# Patient Record
Sex: Male | Born: 1970
Health system: Southern US, Community
[De-identification: ages and names within clinical notes are randomized; demographics above are authoritative.]

## PROBLEM LIST (undated history)

## (undated) DIAGNOSIS — I639 Cerebral infarction, unspecified: Secondary | ICD-10-CM

## (undated) DIAGNOSIS — I10 Essential (primary) hypertension: Secondary | ICD-10-CM

## (undated) DIAGNOSIS — I509 Heart failure, unspecified: Secondary | ICD-10-CM

## (undated) DIAGNOSIS — T7840XA Allergy, unspecified, initial encounter: Secondary | ICD-10-CM

## (undated) DIAGNOSIS — E669 Obesity, unspecified: Secondary | ICD-10-CM

## (undated) DIAGNOSIS — N189 Chronic kidney disease, unspecified: Secondary | ICD-10-CM

## (undated) DIAGNOSIS — E119 Type 2 diabetes mellitus without complications: Secondary | ICD-10-CM

## (undated) DIAGNOSIS — N182 Chronic kidney disease, stage 2 (mild): Secondary | ICD-10-CM

## (undated) HISTORY — DX: Allergy, unspecified, initial encounter: T78.40XA

## (undated) HISTORY — DX: Type 2 diabetes mellitus without complications: E11.9

## (undated) HISTORY — DX: Chronic kidney disease, unspecified: N18.9

## (undated) HISTORY — DX: Heart failure, unspecified: I50.9

## (undated) HISTORY — DX: Cerebral infarction, unspecified: I63.9

## (undated) HISTORY — DX: Chronic kidney disease, stage 2 (mild): N18.2

---

## 2012-11-29 ENCOUNTER — Ambulatory Visit (INDEPENDENT_AMBULATORY_CARE_PROVIDER_SITE_OTHER): Payer: BC Managed Care – PPO | Admitting: Family Medicine

## 2012-11-29 VITALS — BP 134/84 | HR 96 | Temp 98.7°F | Resp 16 | Ht 72.0 in | Wt 329.0 lb

## 2012-11-29 DIAGNOSIS — R059 Cough, unspecified: Secondary | ICD-10-CM

## 2012-11-29 DIAGNOSIS — R05 Cough: Secondary | ICD-10-CM

## 2012-11-29 DIAGNOSIS — J069 Acute upper respiratory infection, unspecified: Secondary | ICD-10-CM

## 2012-11-29 MED ORDER — HYDROCODONE-HOMATROPINE 5-1.5 MG/5ML PO SYRP: ORAL_SOLUTION | ORAL | Status: DC

## 2012-11-29 MED ORDER — AZITHROMYCIN 250 MG PO TABS: ORAL_TABLET | ORAL | Status: DC

## 2012-11-29 NOTE — Progress Notes (Signed)
Subjective:    Patient ID: Richard [name], male    DOB: 10/22/1970, 42 y.o.   MRN: [id number]  HPI Richard [name] is a 42 y.o. male  Cough congestion - started 6 days ago - thought improving past few days, then worse this am - more mucus/cough. Clear-yellow mucus. No mucus. fever 3 days ago - not past 2 days.   Fatigue.  Cough has kept up at night at times.  Asthma as child - no recent flairs and no wheezing with colds/cough  Tx: mucinex otc, otc zyrtec. Mod relief.   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     Past Medical History  Diagnosis Date  . Allergy    History reviewed. No pertinent past surgical history. No Known Allergies Prior to Admission medications   Not on File   History   Social History  . Marital Status: Single    Spouse Name: N/A    Number of Children: N/A  . Years of Education: N/A   Occupational History  . Not on file.   Social History Main Topics  . Smoking status: Never Smoker   . Smokeless tobacco: Not on file  . Alcohol Use: No  . Drug Use: No  . Sexually Active: Not on file   Other Topics Concern  . Not on file   Social History Narrative  . No narrative on file       Review of Systems  Constitutional: Negative for fever and chills.  HENT: Positive for congestion. Negative for sinus pressure.   Respiratory: Positive for cough and shortness of breath (few days ago only - not today. ). Negative for chest tightness and wheezing.   Cardiovascular: Negative for chest pain and palpitations.       Objective:   Physical Exam  Vitals reviewed. Constitutional: He is oriented to person, place, and time. He appears well-developed and well-nourished.  HENT:  Head: Normocephalic and atraumatic.  Right Ear: Tympanic membrane, external ear and ear canal normal.  Left Ear: Tympanic membrane, external ear and ear canal normal.  Nose: No rhinorrhea. Right sinus exhibits no maxillary sinus tenderness and no frontal sinus  tenderness. Left sinus exhibits no maxillary sinus tenderness and no frontal sinus tenderness.  Mouth/Throat: Oropharynx is clear and moist and mucous membranes are normal. No oropharyngeal exudate or posterior oropharyngeal erythema.  Eyes: Conjunctivae are normal. Pupils are equal, round, and reactive to light.  Neck: Neck supple.  Cardiovascular: Normal rate, regular rhythm, normal heart sounds and intact distal pulses.   No murmur heard. Pulmonary/Chest: Effort normal and breath sounds normal. He has no wheezes. He has no rhonchi. He has no rales.  Clear, normal effort, no distress.   Abdominal: Soft. There is no tenderness.  Lymphadenopathy:    He has no cervical adenopathy.  Neurological: He is alert and oriented to person, place, and time.  Skin: Skin is warm and dry. No rash noted.  Psychiatric: He has a normal mood and affect. His behavior is normal.       Assessment & Plan:  Richard [name] is a 42 y.o. male Cough - Plan: HYDROcodone-homatropine (HYCODAN) 5-1.5 MG/5ML syrup, azithromycin (ZITHROMAX) 250 MG tablet  Acute upper respiratory infections of unspecified site - Plan: azithromycin (ZITHROMAX) 250 MG tablet  6 day hx of cough, URI likely vs early bronchitis with slight subjective worsening this am. Cont sx care with mucinex, hycodan at night if needed, zpak Rx - can wait to fill for 1-2 days  if not starting to improve. rtc precautions given.   Meds ordered this encounter  Medications  . HYDROcodone-homatropine (HYCODAN) 5-1.5 MG/5ML syrup    Sig: 53m by mouth at bedtime as needed for cough.    Dispense:  120 mL    Refill:  0  . azithromycin (ZITHROMAX) 250 MG tablet    Sig: Take 2 pills by mouth on day 1, then 1 pill by mouth per day on days 2 through 5.    Dispense:  6 each    Refill:  0   Patient Instructions   over the counter mucinex or mucinex DM, drink plenty of fluids. Cough syrup at night if needed. If not improving in the next day or two - can fill  antibiotic. Return to the clinic or go to the nearest emergency room if any of your symptoms worsen or new symptoms occur. Cough, Adult  A cough is a reflex that helps clear your throat and airways. It can help heal the body or may be a reaction to an irritated airway. A cough may only last 2 or 3 weeks (acute) or may last more than 8 weeks (chronic).  CAUSES Acute cough:  Viral or bacterial infections. Chronic cough:  Infections.  Allergies.  Asthma.  Post-nasal drip.  Smoking.  Heartburn or acid reflux.  Some medicines.  Chronic lung problems (COPD).  Cancer. SYMPTOMS   Cough.  Fever.  Chest pain.  Increased breathing rate.  High-pitched whistling sound when breathing (wheezing).  Colored mucus that you cough up (sputum). TREATMENT   A bacterial cough may be treated with antibiotic medicine.  A viral cough must run its course and will not respond to antibiotics.  Your caregiver may recommend other treatments if you have a chronic cough. HOME CARE INSTRUCTIONS   Only take over-the-counter or prescription medicines for pain, discomfort, or fever as directed by your caregiver. Use cough suppressants only as directed by your caregiver.  Use a cold steam vaporizer or humidifier in your bedroom or home to help loosen secretions.  Sleep in a semi-upright position if your cough is worse at night.  Rest as needed.  Stop smoking if you smoke. SEEK IMMEDIATE MEDICAL CARE IF:   You have pus in your sputum.  Your cough starts to worsen.  You cannot control your cough with suppressants and are losing sleep.  You begin coughing up blood.  You have difficulty breathing.  You develop pain which is getting worse or is uncontrolled with medicine.  You have a fever. MAKE SURE YOU:   Understand these instructions.  Will watch your condition.  Will get help right away if you are not doing well or get worse. Document Released: 11/17/2010 Document Revised:  08/13/2011 Document Reviewed: 11/17/2010 [facility] Patient Information 2014 [place].

## 2012-11-29 NOTE — Patient Instructions (Signed)
over the counter mucinex or mucinex DM, drink plenty of fluids. Cough syrup at night if needed. If not improving in the next day or two - can fill antibiotic. Return to the clinic or go to the nearest emergency room if any of your symptoms worsen or new symptoms occur. Cough, Adult  A cough is a reflex that helps clear your throat and airways. It can help heal the body or may be a reaction to an irritated airway. A cough may only last 2 or 3 weeks (acute) or may last more than 8 weeks (chronic).  CAUSES Acute cough:  Viral or bacterial infections. Chronic cough:  Infections.  Allergies.  Asthma.  Post-nasal drip.  Smoking.  Heartburn or acid reflux.  Some medicines.  Chronic lung problems (COPD).  Cancer. SYMPTOMS   Cough.  Fever.  Chest pain.  Increased breathing rate.  High-pitched whistling sound when breathing (wheezing).  Colored mucus that you cough up (sputum). TREATMENT   A bacterial cough may be treated with antibiotic medicine.  A viral cough must run its course and will not respond to antibiotics.  Your caregiver may recommend other treatments if you have a chronic cough. HOME CARE INSTRUCTIONS   Only take over-the-counter or prescription medicines for pain, discomfort, or fever as directed by your caregiver. Use cough suppressants only as directed by your caregiver.  Use a cold steam vaporizer or humidifier in your bedroom or home to help loosen secretions.  Sleep in a semi-upright position if your cough is worse at night.  Rest as needed.  Stop smoking if you smoke. SEEK IMMEDIATE MEDICAL CARE IF:   You have pus in your sputum.  Your cough starts to worsen.  You cannot control your cough with suppressants and are losing sleep.  You begin coughing up blood.  You have difficulty breathing.  You develop pain which is getting worse or is uncontrolled with medicine.  You have a fever. MAKE SURE YOU:   Understand these  instructions.  Will watch your condition.  Will get help right away if you are not doing well or get worse. Document Released: 11/17/2010 Document Revised: 08/13/2011 Document Reviewed: 11/17/2010 [facility] Patient Information 2014 [place].

## 2012-11-30 ENCOUNTER — Other Ambulatory Visit: Payer: Self-pay | Admitting: Family Medicine

## 2012-11-30 DIAGNOSIS — J069 Acute upper respiratory infection, unspecified: Secondary | ICD-10-CM

## 2012-11-30 DIAGNOSIS — R05 Cough: Secondary | ICD-10-CM

## 2012-11-30 DIAGNOSIS — R059 Cough, unspecified: Secondary | ICD-10-CM

## 2013-01-03 ENCOUNTER — Emergency Department (HOSPITAL_COMMUNITY): Payer: BC Managed Care – PPO

## 2013-01-03 ENCOUNTER — Encounter (HOSPITAL_COMMUNITY): Payer: Self-pay | Admitting: Emergency Medicine

## 2013-01-03 ENCOUNTER — Ambulatory Visit (INDEPENDENT_AMBULATORY_CARE_PROVIDER_SITE_OTHER): Payer: BC Managed Care – PPO | Admitting: Family Medicine

## 2013-01-03 ENCOUNTER — Ambulatory Visit: Payer: BC Managed Care – PPO

## 2013-01-03 ENCOUNTER — Inpatient Hospital Stay (HOSPITAL_COMMUNITY)
Admission: EM | Admit: 2013-01-03 | Discharge: 2013-01-10 | DRG: 544 | Disposition: A | Discharge status: Home / Self Care | Payer: BC Managed Care – PPO | Attending: Internal Medicine | Admitting: Internal Medicine

## 2013-01-03 VITALS — BP 160/102 | HR 97 | Temp 97.9°F | Resp 20 | Ht 71.0 in | Wt 346.1 lb

## 2013-01-03 DIAGNOSIS — E119 Type 2 diabetes mellitus without complications: Secondary | ICD-10-CM | POA: Diagnosis present

## 2013-01-03 DIAGNOSIS — I428 Other cardiomyopathies: Secondary | ICD-10-CM | POA: Diagnosis present

## 2013-01-03 DIAGNOSIS — R0602 Shortness of breath: Secondary | ICD-10-CM

## 2013-01-03 DIAGNOSIS — R0601 Orthopnea: Secondary | ICD-10-CM | POA: Diagnosis present

## 2013-01-03 DIAGNOSIS — I5023 Acute on chronic systolic (congestive) heart failure: Secondary | ICD-10-CM | POA: Diagnosis present

## 2013-01-03 DIAGNOSIS — G4733 Obstructive sleep apnea (adult) (pediatric): Secondary | ICD-10-CM | POA: Diagnosis present

## 2013-01-03 DIAGNOSIS — I2789 Other specified pulmonary heart diseases: Secondary | ICD-10-CM | POA: Diagnosis present

## 2013-01-03 DIAGNOSIS — E876 Hypokalemia: Secondary | ICD-10-CM | POA: Diagnosis present

## 2013-01-03 DIAGNOSIS — R635 Abnormal weight gain: Secondary | ICD-10-CM | POA: Diagnosis present

## 2013-01-03 DIAGNOSIS — I509 Heart failure, unspecified: Secondary | ICD-10-CM

## 2013-01-03 DIAGNOSIS — E669 Obesity, unspecified: Secondary | ICD-10-CM | POA: Diagnosis present

## 2013-01-03 DIAGNOSIS — R739 Hyperglycemia, unspecified: Secondary | ICD-10-CM

## 2013-01-03 DIAGNOSIS — N179 Acute kidney failure, unspecified: Secondary | ICD-10-CM | POA: Diagnosis present

## 2013-01-03 DIAGNOSIS — R609 Edema, unspecified: Secondary | ICD-10-CM

## 2013-01-03 DIAGNOSIS — I129 Hypertensive chronic kidney disease with stage 1 through stage 4 chronic kidney disease, or unspecified chronic kidney disease: Secondary | ICD-10-CM | POA: Diagnosis present

## 2013-01-03 DIAGNOSIS — Z6841 Body Mass Index (BMI) 40.0 and over, adult: Secondary | ICD-10-CM

## 2013-01-03 DIAGNOSIS — R0600 Dyspnea, unspecified: Secondary | ICD-10-CM

## 2013-01-03 DIAGNOSIS — R7309 Other abnormal glucose: Secondary | ICD-10-CM

## 2013-01-03 DIAGNOSIS — N182 Chronic kidney disease, stage 2 (mild): Secondary | ICD-10-CM | POA: Diagnosis present

## 2013-01-03 DIAGNOSIS — I161 Hypertensive emergency: Secondary | ICD-10-CM

## 2013-01-03 HISTORY — DX: Heart failure, unspecified: I50.9

## 2013-01-03 HISTORY — DX: Obesity, unspecified: E66.9

## 2013-01-03 HISTORY — DX: Essential (primary) hypertension: I10

## 2013-01-03 LAB — CBC
HCT: 40.6 % (ref 39.0–52.0)
Hemoglobin: 13.2 g/dL (ref 13.0–17.0)
MCH: 27.4 pg (ref 26.0–34.0)
MCHC: 32.5 g/dL (ref 30.0–36.0)
MCV: 84.2 fL (ref 78.0–100.0)
RDW: 15.9 % — ABNORMAL HIGH (ref 11.5–15.5)

## 2013-01-03 LAB — BASIC METABOLIC PANEL
BUN: 20 mg/dL (ref 6–23)
Chloride: 100 mEq/L (ref 96–112)
Creatinine, Ser: 1.42 mg/dL — ABNORMAL HIGH (ref 0.50–1.35)
GFR calc Af Amer: 69 mL/min — ABNORMAL LOW (ref >90)
Glucose, Bld: 154 mg/dL — ABNORMAL HIGH (ref 70–99)
Potassium: 3.2 mEq/L — ABNORMAL LOW (ref 3.5–5.1)

## 2013-01-03 LAB — POCT UA - MICROSCOPIC ONLY
Crystals, Ur, HPF, POC: NEGATIVE
Epithelial cells, urine per micros: NEGATIVE
Mucus, UA: POSITIVE

## 2013-01-03 LAB — POCT URINALYSIS DIPSTICK
Leukocytes, UA: NEGATIVE
Protein, UA: 300
Spec Grav, UA: >=1.03
pH, UA: 6

## 2013-01-03 LAB — POCT CBC
Granulocyte percent: 62.8 %G (ref 37–80)
HCT, POC: 42.9 % — AB (ref 43.5–53.7)
Hemoglobin: 13 g/dL — AB (ref 14.1–18.1)
MCH, POC: 27 pg (ref 27–31.2)
MCV: 89.2 fL (ref 80–97)
MID (cbc): 0.6 (ref 0–0.9)
RBC: 4.81 M/uL (ref 4.69–6.13)
WBC: 7 10*3/uL (ref 4.6–10.2)

## 2013-01-03 LAB — POCT GLYCOSYLATED HEMOGLOBIN (HGB A1C): Hemoglobin A1C: 8.7

## 2013-01-03 LAB — POCT I-STAT TROPONIN I: Troponin i, poc: 0.04 ng/mL (ref 0.00–0.08)

## 2013-01-03 MED ORDER — POTASSIUM CHLORIDE CRYS ER 20 MEQ PO TBCR
60.0000 meq | EXTENDED_RELEASE_TABLET | Freq: Once | ORAL | Status: AC
  Administered 2013-01-03: 60 meq via ORAL
  Filled 2013-01-03: qty 3

## 2013-01-03 MED ORDER — NITROGLYCERIN IN D5W 200-5 MCG/ML-% IV SOLN
2.0000 ug/min | INTRAVENOUS | Status: DC
  Administered 2013-01-04: 30 ug/min via INTRAVENOUS
  Administered 2013-01-04: 20 ug/min via INTRAVENOUS

## 2013-01-03 MED ORDER — FUROSEMIDE 10 MG/ML IJ SOLN
40.0000 mg | Freq: Once | INTRAMUSCULAR | Status: AC
  Administered 2013-01-03: 40 mg via INTRAVENOUS
  Filled 2013-01-03: qty 4

## 2013-01-03 MED ORDER — NITROGLYCERIN IN D5W 200-5 MCG/ML-% IV SOLN
2.0000 ug/min | Freq: Once | INTRAVENOUS | Status: AC
  Administered 2013-01-03: 5 ug/min via INTRAVENOUS
  Filled 2013-01-03: qty 250

## 2013-01-03 NOTE — H&P (Signed)
[facility] History and Physical  Mai Nicolich  [id number]  DOB: 1970/09/12  DOA: 01/03/2013  Referring physician: Dr. Wilson Singer PCP: No PCP Per Patient   Chief Complaint: Shortness of breath since last few days  HPI: Richard [name] is a 42 y.o. male with no significant past medical history presented today initially to the urgent care with the complaint of shortness of breath that has been progressively worsening since last 4 weeks. He mentions that one month ago he was treated her for upper respiratory tract infection with azithromycin. His symptoms resolved with that. But since then he has been having gradual worsening of shortness of breath on exertion. Since last 2 days the symptoms progressed to shortness of breath on minimal exertion. Today he woke up at rest shortness of breath.  He also has noted 17 pound weight gain over last 4 weeks, bilateral lower extended swelling which is progressively worsening and orthopnea.  He denies any chest pain, palpitation, dizziness, lightheadedness, headache, sore throat, fever, chills, leg pain, diarrhea, constipation, urinary symptoms. He denies any use of illicit drugs or supplements.  Review of Systems: as mentioned in the history of present illness.  A Comprehensive review of the other systems is negative.  Past Medical History  Diagnosis Date  . Allergy    History reviewed. No pertinent past surgical history. Social History:  reports that he has never smoked. He does not have any smokeless tobacco history on file. He reports that he does not drink alcohol or use illicit drugs. Patient is coming from home. Patient can participate in ADLs.  No Known Allergies  Family History  Problem Relation Age of Onset  . Heart disease Father     Prior to Admission medications   Medication Sig Start Date End Date Taking? Authorizing Provider  ibuprofen (ADVIL,MOTRIN) 200 MG tablet Take 200 mg by mouth every 6 (six) hours as needed for pain.    Yes Historical Provider, MD  naproxen sodium (ANAPROX) 220 MG tablet Take 220 mg by mouth 2 (two) times daily as needed. For pain   Yes Historical Provider, MD    Physical Exam: Filed Vitals:   01/03/13 2011 01/03/13 2104 01/03/13 2202  BP: 192/135 191/138 180/139  Pulse: 100 95 89  Temp: 97.6 F (36.4 C)    TempSrc: Oral    Resp: 21 32 16  SpO2: 97% 100% 99%    General: Alert, Awake and Oriented to Time, Place and Person. Appear in severe distress Eyes: PERRL ENT: Oral Mucosa clear moist. Neck: Difficult to elicit JVD, no Carotid Bruits,  Cardiovascular: S1 and S2 Present, no Murmur, Peripheral Pulses Present Respiratory: Bilateral Air entry equal and Decreased, minimal basal Crackles, no wheezes Abdomen: Bowel Sound Present, Soft and Non tender, distended Skin: No Rash Extremities: Bilateral Pedal edema, no calf tenderness Neurologic: Grossly Unremarkable.  Labs on Admission:  Basic Metabolic Panel:  Recent Labs Lab 01/03/13 2034  NA 138  K 3.2*  CL 100  CO2 23  GLUCOSE 154*  BUN 20  CREATININE 1.42*  CALCIUM 8.4   Liver Function Tests: No results found for this basename: AST, ALT, ALKPHOS, BILITOT, PROT, ALBUMIN,  in the last 168 hours No results found for this basename: LIPASE, AMYLASE,  in the last 168 hours No results found for this basename: AMMONIA,  in the last 168 hours CBC:  Recent Labs Lab 01/03/13 1800 01/03/13 2034  WBC 7.0 6.3  HGB 13.0* 13.2  HCT 42.9* 40.6  MCV 89.2 84.2  PLT  --  206   Cardiac Enzymes: No results found for this basename: CKTOTAL, CKMB, CKMBINDEX, TROPONINI,  in the last 168 hours  BNP (last 3 results)  Recent Labs  01/03/13 2037  PROBNP 3496.0*   CBG: No results found for this basename: GLUCAP,  in the last 168 hours  Radiological Exams on Admission: Dg Chest Port 1 View  01/03/2013   *RADIOLOGY REPORT*  Clinical Data: Respiratory distress  PORTABLE CHEST - 1 VIEW  Comparison: 01/03/2013 1718 hours  Findings:  The cardiac shadow is enlarged.  The lungs are clear.  No focal infiltrate is noted.  Bony structures are within normal limits.  IMPRESSION: No acute abnormality noted.   Original Report Authenticated By: Inez Catalina, M.D.    EKG: Independently reviewed. No ST-T wave changes suggestive of ischemia.  Assessment/Plan Principal Problem:   Acute decompensated heart failure Active Problems:   Hypertensive emergency   1. hypertensive emergency with acute decompensated heart failure Pt presented with severe dyspnea and elevated BNP and hypertensive emergency. He does not have any confusion or vision symptoms. He is responding to nitroglycerin drip and in view of his acute heart failure we will continue the nitroglycerin drip for vasodilating effect.  For his heart failure the patient has received IV Lasix and responded well to that as well with improvement in his shortness of breath and oxygen requirement. We will continue with further dose of IV Lasix from tomorrow 40 mg twice a day. The etiology of his heart failure is more likely secondary to accelerated hypertension. But other etiology also likely including systolic dysfunction, viral prodrome, thyroid dysfunction. We will obtain an echocardiogram telemetry and serial troponins and further workup to delineate the etiology. Avoiding beta blocker in the setting of acute heart failure, avoiding ACE inhibitor in the setting of kidney injury.    2. Acute kidney injury Most likely secondary to hypertensive emergency, versus cardiorenal syndrome. We will obtain ultrasound of the kidneys in the morning.  3. Hyperglycemia Patient has elevated glucose, possibly prediabetic. We will check HbA1c for secondary prophylaxis and lipid profile.  DVT Prophylaxis: subcutaneous heparin mechanical compression device Nutrition: cardiac  Code Status: full    Author: Berle Mull, MD [facility] Pager: [phone] 01/03/2013, 11:30 PM    If 7PM-7AM,  please contact night-coverage [email or web address] Password [email or web address]

## 2013-01-03 NOTE — ED Notes (Signed)
MD at bedside. Admitting MD at bedside

## 2013-01-03 NOTE — Progress Notes (Signed)
History and physical exam reviewed in detail; patient personally examined by myself.  2+ pitting edema to lower abdomen R leg >  L leg.  Tachypnea to 30-32.  EKG with diffuse ST changes.  CXR with cardiomegaly and pulmonary edema.  A/P:  CHF new onset moderate; sent via EMS for ED evaluation; warrants cardiac enzymes, 2D-echo, possible CT chest to rule out large embolism.

## 2013-01-03 NOTE — ED Provider Notes (Signed)
CSN: [id number]     Arrival date & time 01/03/13  1951 History    41yM with sob. Onset this morning. Progressively worsening through out day. Exacerbated by exertion. Denies cp. Occasional nonproductive cough. No fever or chills. Worsening LE edema. Reports ~17 lbs unintentional weight gain over past 3-4 weeks. No significant pmhx but does not have regular medical care.    First MD Initiated Contact with Patient 01/03/13 1956     Chief Complaint  Patient presents with  . Respiratory Distress   (Consider location/radiation/quality/duration/timing/severity/associated sxs/prior Treatment) HPI  Past Medical History  Diagnosis Date  . Allergy    History reviewed. No pertinent past surgical history. Family History  Problem Relation Age of Onset  . Heart disease Father    History  Substance Use Topics  . Smoking status: Never Smoker   . Smokeless tobacco: Not on file  . Alcohol Use: No    Review of Systems  All systems reviewed and negative, other than as noted in HPI.   Allergies  Review of patient's allergies indicates no known allergies.  Home Medications   Current Outpatient Rx  Name  Route  Sig  Dispense  Refill  . azithromycin (ZITHROMAX) 250 MG tablet      Take 2 pills by mouth on day 1, then 1 pill by mouth per day on days 2 through 5.   6 each   0   . HYDROcodone-homatropine (HYCODAN) 5-1.5 MG/5ML syrup      64m by mouth at bedtime as needed for cough.   120 mL   0    BP 192/135  Pulse 100  Temp(Src) 97.6 F (36.4 C) (Oral)  Resp 21  SpO2 97% Physical Exam  Nursing note and vitals reviewed. Constitutional: He appears well-developed and well-nourished. No distress.  HENT:  Head: Normocephalic and atraumatic.  Eyes: Conjunctivae are normal. Right eye exhibits no discharge. Left eye exhibits no discharge.  Neck: Neck supple.  Cardiovascular: Normal rate, regular rhythm and normal heart sounds.  Exam reveals no gallop and no friction rub.   No murmur  heard. Pulmonary/Chest: Effort normal. No respiratory distress. He has rales.  tachypnea  Abdominal: Soft. He exhibits no distension. There is no tenderness.  Musculoskeletal: He exhibits no edema and no tenderness.  Severe symmetric pitting LE edema  Neurological: He is alert.  Skin: Skin is warm and dry.  Psychiatric: He has a normal mood and affect. His behavior is normal. Thought content normal.    ED Course   Procedures (including critical care time)  Labs Reviewed  CBC - Abnormal; Notable for the following:    RDW 15.9 (*)    All other components within normal limits  BASIC METABOLIC PANEL - Abnormal; Notable for the following:    Potassium 3.2 (*)    Glucose, Bld 154 (*)    Creatinine, Ser 1.42 (*)    GFR calc non Af Amer 60 (*)    GFR calc Af Amer 69 (*)    All other components within normal limits  PRO B NATRIURETIC PEPTIDE - Abnormal; Notable for the following:    Pro B Natriuretic peptide (BNP) 3496.0 (*)    All other components within normal limits  POCT I-STAT TROPONIN I   Dg Chest Port 1 View  01/03/2013   *RADIOLOGY REPORT*  Clinical Data: Respiratory distress  PORTABLE CHEST - 1 VIEW  Comparison: 01/03/2013 1718 hours  Findings: The cardiac shadow is enlarged.  The lungs are clear.  No focal infiltrate  is noted.  Bony structures are within normal limits.  IMPRESSION: No acute abnormality noted.   Original Report Authenticated By: Inez Catalina, M.D.   EKG:  Rhythm: normal sinus Vent. rate 87 BPM PR interval 136 ms QRS duration 76 ms QT/QTc 398/478 ms ST segments: ns st changes   1. Dyspnea   2. Acute Heart Failure 3. Renal Insufficiency 4. Hypertension 5. Hypokalemia   MDM  41yM with dyspnea. Fluid overloaded. Increasing weight gain/edema. Orthopnea. Severe hypertension. Cardiomegaly. Elevated BNP.  Started on nitro gtt and lasix. Additionally, renal insufficiency and likely previously undiagnosed diabetes. Pt with no routine medical care. Multiple  issues which need addressed.   Virgel Manifold, MD 01/08/13 1204

## 2013-01-03 NOTE — Progress Notes (Signed)
Subjective:    Patient ID: Richard [name], male    DOB: 1971-04-08, 42 y.o.   MRN: [id number]  HPI  42 y.o. Male presents to clinic today for shortness of breath. Patient was seen 1 month ago and treated for a URI. Since being on Azithromycin his URI symptoms have cleared but he has had SOB since. Today when he woke up he noticed worsening shortness of breath. He says he kept feeling like he could not catch his breath when he bent over to put his socks on and it sort of alarmed him. He has not had a cough, sore throat, fever, chills, chest pain, or weakness. Patient has not had worsening sob at night or needed to sit up to sleep or use of extra pillows.   Patient also has noticed he has had fluid on his knees and swelling in his ankles more so than usual. He has not been working out as much but he also has noticed significant weight gain.   Family history significant for CVD. Father passed away from CHF. Patient does not take any medications and NKDA.    Review of Systems  Constitutional: Positive for unexpected weight change. Negative for fever, chills, diaphoresis, activity change, appetite change and fatigue.  HENT: Negative for congestion, trouble swallowing, neck pain, neck stiffness and sinus pressure.   Eyes: Negative for visual disturbance.  Respiratory: Positive for shortness of breath. Negative for cough, chest tightness and wheezing.   Cardiovascular: Positive for leg swelling. Negative for chest pain and palpitations.  Gastrointestinal: Positive for abdominal distention. Negative for nausea, vomiting, abdominal pain, diarrhea and constipation.  Genitourinary: Negative for difficulty urinating.  Musculoskeletal: Positive for joint swelling. Negative for myalgias and arthralgias.  Skin: Negative for pallor and rash.  Neurological: Negative for syncope, light-headedness and headaches.  All other systems reviewed and are negative.      Objective:   Physical Exam  Nursing note and  vitals reviewed. Constitutional: He is oriented to person, place, and time. He appears well-developed and well-nourished. No distress. Nasal cannula in place.  Obese male patient. He has had a 17 lb weight gain in the past 5 weeks.   HENT:  Head: Normocephalic and atraumatic.  Right Ear: External ear normal.  Left Ear: External ear normal.  Nose: Nose normal.  Eyes: Conjunctivae and lids are normal.  Neck: Normal range of motion. Neck supple. No thyromegaly present.  Cardiovascular: Normal rate, regular rhythm and normal heart sounds.   1-2+ pitting edema all the way up to lower abdomen; presacral edema  Pulmonary/Chest: Breath sounds normal. No accessory muscle usage. Tachypnea noted. He is in respiratory distress.  Abdominal: He exhibits distension. There is no tenderness.  Edema of the lower abdominal wall  Musculoskeletal: Normal range of motion.  Lymphadenopathy:    He has no cervical adenopathy.  Neurological: He is alert and oriented to person, place, and time. He has normal strength. No cranial nerve deficit or sensory deficit.  Skin: Skin is warm, dry and intact. He is not diaphoretic. No pallor.  Psychiatric: He has a normal mood and affect. His speech is normal and behavior is normal. Judgment and thought content normal. Cognition and memory are normal.   BP 160/102  Pulse 97  Temp(Src) 97.9 F (36.6 C) (Oral)  Resp 20  Ht 5\' 11"  (1.803 m)  Wt 346 lb 2.2 oz (157.008 kg)  BMI 48.3 kg/m2  SpO2 96%  Results for orders placed in visit on 01/03/13  POCT CBC  Result Value Range   WBC 7.0  4.6 - 10.2 K/uL   Lymph, poc 2.0  0.6 - 3.4   POC LYMPH PERCENT 28.7  10 - 50 %L   MID (cbc) 0.6  0 - 0.9   POC MID % 8.5  0 - 12 %M   POC Granulocyte 4.4  2 - 6.9   Granulocyte percent 62.8  37 - 80 %G   RBC 4.81  4.69 - 6.13 M/uL   Hemoglobin 13.0 (*) 14.1 - 18.1 g/dL   HCT, POC 42.9 (*) 43.5 - 53.7 %   MCV 89.2  80 - 97 fL   MCH, POC 27.0  27 - 31.2 pg   MCHC 30.3 (*) 31.8  - 35.4 g/dL   RDW, POC 16.5     Platelet Count, POC 230  142 - 424 K/uL   MPV 9.5  0 - 99.8 fL  GLUCOSE, POCT (MANUAL RESULT ENTRY)      Result Value Range   POC Glucose 154 (*) 70 - 99 mg/dl  POCT UA - MICROSCOPIC ONLY      Result Value Range   WBC, Ur, HPF, POC 7-10     RBC, urine, microscopic 4-8     Bacteria, U Microscopic 3+     Mucus, UA pos     Epithelial cells, urine per micros neg     Crystals, Ur, HPF, POC neg     Casts, Ur, LPF, POC neg     Yeast, UA neg    POCT URINALYSIS DIPSTICK      Result Value Range   Color, UA yellow     Clarity, UA clear     Glucose, UA neg     Bilirubin, UA small     Ketones, UA trace     Spec Grav, UA >=1.030     Blood, UA neg     pH, UA 6.0     Protein, UA 300     Urobilinogen, UA 1.0     Nitrite, UA neg     Leukocytes, UA Negative    POCT GLYCOSYLATED HEMOGLOBIN (HGB A1C)      Result Value Range   Hemoglobin A1C 8.7      CXR primary read by Dr. Tamala Julian. Cardiomegaly, pulmonary edema, and loss of costal margins. No infiltrates.   EKG showed P wave abnormalities, and diffuse T wave changes throughout. No ST elevation. Suspect possible recent MI.   Assessment & Plan:  Shortness of breath - Plan: POCT CBC, DG Chest 2 View, EKG 12-Lead.   Edema - Plan: POCT glucose (manual entry), POCT UA - Microscopic Only, POCT urinalysis dipstick  Hyperglycemia - Plan: POCT glycosylated hemoglobin (Hb A1C).   Bacteruria 3+ but only 7-10 WBC, likely needs urine culture and GC/CT testing.  Mild anemia    Patient was placed on cardiac monitor. IV was started for access. Oxygen 2L/min by nasal canula.   Transport to ED for further evaluation. Patient appears to be in florid heart failure. Also likely new diagnosis of type II diabetes.

## 2013-01-03 NOTE — ED Notes (Signed)
Pt c/o gaining 17 pounds in 1 month and more edema in extremities and also c/o sob worse since this am.

## 2013-01-03 NOTE — ED Notes (Signed)
MD at bedside. Kohut MD

## 2013-01-03 NOTE — Progress Notes (Signed)
I have examined this patient along with the student and agree.  

## 2013-01-04 ENCOUNTER — Inpatient Hospital Stay (HOSPITAL_COMMUNITY): Payer: BC Managed Care – PPO

## 2013-01-04 ENCOUNTER — Encounter (HOSPITAL_COMMUNITY): Payer: Self-pay | Admitting: *Deleted

## 2013-01-04 DIAGNOSIS — R0609 Other forms of dyspnea: Secondary | ICD-10-CM

## 2013-01-04 DIAGNOSIS — N182 Chronic kidney disease, stage 2 (mild): Secondary | ICD-10-CM

## 2013-01-04 DIAGNOSIS — I509 Heart failure, unspecified: Principal | ICD-10-CM

## 2013-01-04 DIAGNOSIS — I10 Essential (primary) hypertension: Secondary | ICD-10-CM

## 2013-01-04 HISTORY — DX: Chronic kidney disease, stage 2 (mild): N18.2

## 2013-01-04 LAB — GLUCOSE, CAPILLARY: Glucose-Capillary: 187 mg/dL — ABNORMAL HIGH (ref 70–99)

## 2013-01-04 LAB — LIPID PANEL: Cholesterol: 134 mg/dL (ref 0–200)

## 2013-01-04 LAB — MAGNESIUM
Magnesium: 1.4 mg/dL — ABNORMAL LOW (ref 1.5–2.5)
Magnesium: 1.7 mg/dL (ref 1.5–2.5)

## 2013-01-04 LAB — COMPREHENSIVE METABOLIC PANEL
Alkaline Phosphatase: 45 U/L (ref 39–117)
BUN: 17 mg/dL (ref 6–23)
Chloride: 103 mEq/L (ref 96–112)
Creatinine, Ser: 1.42 mg/dL — ABNORMAL HIGH (ref 0.50–1.35)
GFR calc Af Amer: 69 mL/min — ABNORMAL LOW (ref >90)
Glucose, Bld: 205 mg/dL — ABNORMAL HIGH (ref 70–99)
Potassium: 3.9 mEq/L (ref 3.5–5.1)
Total Bilirubin: 0.7 mg/dL (ref 0.3–1.2)

## 2013-01-04 LAB — CBC
HCT: 40.1 % (ref 39.0–52.0)
Hemoglobin: 12.8 g/dL — ABNORMAL LOW (ref 13.0–17.0)
MCH: 27.3 pg (ref 26.0–34.0)
MCHC: 31.9 g/dL (ref 30.0–36.0)
RDW: 16.2 % — ABNORMAL HIGH (ref 11.5–15.5)

## 2013-01-04 LAB — PRO B NATRIURETIC PEPTIDE: Pro B Natriuretic peptide (BNP): 3597 pg/mL — ABNORMAL HIGH (ref 0–125)

## 2013-01-04 LAB — BASIC METABOLIC PANEL
BUN: 18 mg/dL (ref 6–23)
Creatinine, Ser: 1.46 mg/dL — ABNORMAL HIGH (ref 0.50–1.35)
GFR calc Af Amer: 67 mL/min — ABNORMAL LOW (ref >90)
GFR calc non Af Amer: 58 mL/min — ABNORMAL LOW (ref >90)
Glucose, Bld: 147 mg/dL — ABNORMAL HIGH (ref 70–99)
Potassium: 3.2 mEq/L — ABNORMAL LOW (ref 3.5–5.1)

## 2013-01-04 LAB — TROPONIN I
Troponin I: <0.3 ng/mL (ref <0.30)
Troponin I: <0.3 ng/mL (ref <0.30)
Troponin I: <0.3 ng/mL (ref <0.30)

## 2013-01-04 LAB — HEMOGLOBIN A1C: Hgb A1c MFr Bld: 9.1 % — ABNORMAL HIGH (ref <5.7)

## 2013-01-04 LAB — PHOSPHORUS: Phosphorus: 4.4 mg/dL (ref 2.3–4.6)

## 2013-01-04 LAB — TSH: TSH: 2.169 u[IU]/mL (ref 0.350–4.500)

## 2013-01-04 MED ORDER — LISINOPRIL 20 MG PO TABS
20.0000 mg | ORAL_TABLET | Freq: Every day | ORAL | Status: DC
  Administered 2013-01-04 – 2013-01-10 (×7): 20 mg via ORAL
  Filled 2013-01-04 (×7): qty 1

## 2013-01-04 MED ORDER — POTASSIUM CHLORIDE CRYS ER 20 MEQ PO TBCR
40.0000 meq | EXTENDED_RELEASE_TABLET | Freq: Once | ORAL | Status: AC
  Administered 2013-01-04: 40 meq via ORAL
  Filled 2013-01-04: qty 2

## 2013-01-04 MED ORDER — LIVING WELL WITH DIABETES BOOK
Freq: Once | Status: AC
  Administered 2013-01-04: 21:00:00
  Filled 2013-01-04: qty 1

## 2013-01-04 MED ORDER — LIVING BETTER WITH HEART FAILURE BOOK
Freq: Once | Status: AC
  Administered 2013-01-04: 21:00:00
  Filled 2013-01-04: qty 1

## 2013-01-04 MED ORDER — FUROSEMIDE 10 MG/ML IJ SOLN
40.0000 mg | Freq: Once | INTRAMUSCULAR | Status: AC
  Administered 2013-01-04: 40 mg via INTRAVENOUS
  Filled 2013-01-04: qty 4

## 2013-01-04 MED ORDER — ENOXAPARIN SODIUM 40 MG/0.4ML ~~LOC~~ SOLN: 40.0000 mg | SUBCUTANEOUS | Status: DC

## 2013-01-04 MED ORDER — HYDROCHLOROTHIAZIDE 25 MG PO TABS
25.0000 mg | ORAL_TABLET | Freq: Every day | ORAL | Status: DC
  Administered 2013-01-04 – 2013-01-05 (×2): 25 mg via ORAL
  Filled 2013-01-04 (×2): qty 1

## 2013-01-04 MED ORDER — ENOXAPARIN SODIUM 40 MG/0.4ML ~~LOC~~ SOLN
40.0000 mg | SUBCUTANEOUS | Status: DC
  Administered 2013-01-04: 40 mg via SUBCUTANEOUS
  Filled 2013-01-04 (×2): qty 0.4

## 2013-01-04 MED ORDER — ACETAMINOPHEN 650 MG RE SUPP: 650.0000 mg | Freq: Four times a day (QID) | RECTAL | Status: DC | PRN

## 2013-01-04 MED ORDER — SODIUM CHLORIDE 0.9 % IJ SOLN
3.0000 mL | Freq: Two times a day (BID) | INTRAMUSCULAR | Status: DC
  Administered 2013-01-04 – 2013-01-10 (×11): 3 mL via INTRAVENOUS

## 2013-01-04 MED ORDER — ACETAMINOPHEN 325 MG PO TABS
650.0000 mg | ORAL_TABLET | Freq: Four times a day (QID) | ORAL | Status: DC | PRN
  Administered 2013-01-04 – 2013-01-06 (×3): 650 mg via ORAL
  Filled 2013-01-04 (×3): qty 2

## 2013-01-04 MED ORDER — CLONIDINE HCL 0.1 MG PO TABS
0.1000 mg | ORAL_TABLET | Freq: Once | ORAL | Status: AC
  Administered 2013-01-04: 0.1 mg via ORAL
  Filled 2013-01-04: qty 1

## 2013-01-04 MED ORDER — ENOXAPARIN SODIUM 80 MG/0.8ML ~~LOC~~ SOLN: 80.0000 mg | SUBCUTANEOUS | Status: DC

## 2013-01-04 MED ORDER — SENNA 8.6 MG PO TABS
1.0000 | ORAL_TABLET | Freq: Every day | ORAL | Status: DC | PRN
  Filled 2013-01-04: qty 1

## 2013-01-04 MED ORDER — MAGNESIUM SULFATE IN D5W 10-5 MG/ML-% IV SOLN
1.0000 g | Freq: Once | INTRAVENOUS | Status: AC
  Administered 2013-01-04: 1 g via INTRAVENOUS
  Filled 2013-01-04: qty 100

## 2013-01-04 NOTE — Progress Notes (Signed)
  Echocardiogram 2D Echocardiogram has been performed.  Mauricio Po 01/04/2013, 9:54 AM

## 2013-01-04 NOTE — Progress Notes (Addendum)
[facility] Progress Note [place] TEAM 1 - Stepdown/ICU TEAM   Rudolph Bing [id number] DOB: 03-19-1971 DOA: 01/03/2013 PCP: No PCP Per Patient  Brief narrative: This is a 42 year old male presented with complaint of severe shortness of breath which had been progressive along with pedal edema. The patient states that he's never had a diagnosis of hypertension (does not have a PCP) but on admission was noted to be severely hypertensive. He stated that he went to urgent care a while back and was diagnosed with a chest infection. Notes in Epic revealed that he was at urgent care on 6/28 and received Zithromax for bronchitis. No blood pressure was noted on that note but patient tells me that it was normal at that time.  Assessment/Plan: Principal Problem:   Acute decompensated heart failure -Due to long-term uncontrolled blood pressure-chest x-ray reveals severe cardiomegaly with boot-shaped heart. -Followup on echo -Was given 40 mg of IV Lasix yesterday and is in negative balance by 3 L -Will give another 40 now and reassess tomorrow-will give potassium also  Active Problems:    Hypertensive emergency -Started on a nitroglycerin infusion -Will start lisinopril/HCTZ today and also give a one-time dose of clonidine to get him off the nitroglycerin infusion -Will likely need a beta blocker or a calcium channel blocker if noted to have LVH and diastolic dysfunction  CKD 2 -Likely hypertensive nephropathy Renal ultrasound ordered by admitting physician   Code Status: Full  Family Communication: None Disposition Plan: Transfer out of the step down once off of nitroglycerin  Consultants: None  Procedures: None  Antibiotics: none  DVT prophylaxis: Lovenox  HPI/Subjective: Patient is awake and alert and states he is feeling better than yesterday.   Objective: Blood pressure 135/58, pulse 83, temperature 97.7 F (36.5 C), temperature source Oral, resp. rate 21,  height 5\' 11"  (1.803 m), weight 152.8 kg (336 lb 13.8 oz), SpO2 99.00%.  Intake/Output Summary (Last 24 hours) at 01/04/13 1144 Last data filed at 01/04/13 0900  Gross per 24 hour  Intake 303.25 ml  Output   3375 ml  Net -3071.75 ml     Exam: General: No acute respiratory distress Lungs: Clear to auscultation bilaterally without wheezes or crackles Cardiovascular: Regular rate and rhythm without murmur gallop or rub normal S1 and S2 Abdomen: Nontender, nondistended, soft, bowel sounds positive, no rebound, no ascites, no appreciable mass Extremities: No significant cyanosis, clubbing-positive for edema bilateral lower extremities  Data Reviewed: Basic Metabolic Panel:  Recent Labs Lab 01/03/13 2034 01/04/13 0020 01/04/13 0710  NA 138 142 142  K 3.2* 3.2* 3.9  CL 100 102 103  CO2 23 29 29   GLUCOSE 154* 147* 205*  BUN 20 18 17   CREATININE 1.42* 1.46* 1.42*  CALCIUM 8.4 8.3* 8.2*  MG  --  1.4* 1.7  PHOS  --  4.4  --    Liver Function Tests:  Recent Labs Lab 01/04/13 0710  AST 32  ALT 46  ALKPHOS 45  BILITOT 0.7  PROT 6.0  ALBUMIN 2.9*   No results found for this basename: LIPASE, AMYLASE,  in the last 168 hours No results found for this basename: AMMONIA,  in the last 168 hours CBC:  Recent Labs Lab 01/03/13 2034 01/04/13 0020  WBC 6.3 7.2  HGB 13.2 12.8*  HCT 40.6 40.1  MCV 84.2 85.5  PLT 206 210   Cardiac Enzymes:  Recent Labs Lab 01/04/13 0020 01/04/13 0710  TROPONINI <0.30 <0.30   BNP (last 3 results)  Recent Labs  01/03/13 2037 01/04/13 0020  PROBNP 3496.0* 3597.0*   CBG:  Recent Labs Lab 01/04/13 0756  GLUCAP 187*    Recent Results (from the past 240 hour(s))  MRSA PCR SCREENING     Status: None   Collection Time    01/04/13  1:10 AM      Result Value Range Status   MRSA by PCR NEGATIVE  NEGATIVE Final   Comment:            The GeneXpert MRSA Assay (FDA     approved for NASAL specimens     only), is one component of a      comprehensive MRSA colonization     surveillance program. It is not     intended to diagnose MRSA     infection nor to guide or     monitor treatment for     MRSA infections.     Studies:  Recent x-ray studies have been reviewed in detail by the Attending Physician  Scheduled Meds:  Scheduled Meds: . enoxaparin (LOVENOX) injection  40 mg Subcutaneous Q24H  . hydrochlorothiazide  25 mg Oral Daily  . lisinopril  20 mg Oral Daily  . sodium chloride  3 mL Intravenous Q12H   Continuous Infusions: . nitroGLYCERIN Stopped (01/04/13 1055)    Time spent on care of this patient: 25 minute   [place], M.D.  [facility] Office  [phone] Pager - Text Page per Shea Evans as per below:  On-Call/Text Page:      [email or web address]      password [email or web address]  If 7PM-7AM, please contact night-coverage [email or web address] Password [email or web address] 01/04/2013, 11:44 AM   LOS: 1 day

## 2013-01-05 LAB — BASIC METABOLIC PANEL
BUN: 15 mg/dL (ref 6–23)
Calcium: 8.6 mg/dL (ref 8.4–10.5)
Creatinine, Ser: 1.31 mg/dL (ref 0.50–1.35)
GFR calc Af Amer: 76 mL/min — ABNORMAL LOW (ref >90)
GFR calc non Af Amer: 66 mL/min — ABNORMAL LOW (ref >90)

## 2013-01-05 LAB — GLUCOSE, CAPILLARY: Glucose-Capillary: 179 mg/dL — ABNORMAL HIGH (ref 70–99)

## 2013-01-05 MED ORDER — ENOXAPARIN SODIUM 80 MG/0.8ML ~~LOC~~ SOLN
80.0000 mg | SUBCUTANEOUS | Status: DC
  Administered 2013-01-05 – 2013-01-10 (×6): 80 mg via SUBCUTANEOUS
  Filled 2013-01-05 (×6): qty 0.8

## 2013-01-05 MED ORDER — HYDRALAZINE HCL 20 MG/ML IJ SOLN
5.0000 mg | INTRAMUSCULAR | Status: DC | PRN
  Administered 2013-01-05 – 2013-01-06 (×2): 5 mg via INTRAVENOUS
  Filled 2013-01-05 (×2): qty 1

## 2013-01-05 MED ORDER — FUROSEMIDE 10 MG/ML IJ SOLN
40.0000 mg | Freq: Two times a day (BID) | INTRAMUSCULAR | Status: DC
  Administered 2013-01-06 – 2013-01-07 (×3): 40 mg via INTRAVENOUS
  Filled 2013-01-05 (×5): qty 4

## 2013-01-05 MED ORDER — FUROSEMIDE 40 MG PO TABS
40.0000 mg | ORAL_TABLET | Freq: Every day | ORAL | Status: DC
  Administered 2013-01-05: 40 mg via ORAL
  Filled 2013-01-05: qty 1

## 2013-01-05 MED ORDER — AMLODIPINE BESYLATE 5 MG PO TABS
5.0000 mg | ORAL_TABLET | Freq: Every day | ORAL | Status: DC
  Administered 2013-01-05 – 2013-01-06 (×2): 5 mg via ORAL
  Filled 2013-01-05 (×3): qty 1

## 2013-01-05 MED ORDER — INSULIN ASPART 100 UNIT/ML ~~LOC~~ SOLN: 0.0000 [IU] | Freq: Every day | SUBCUTANEOUS | Status: DC

## 2013-01-05 MED ORDER — INSULIN ASPART 100 UNIT/ML ~~LOC~~ SOLN
0.0000 [IU] | Freq: Three times a day (TID) | SUBCUTANEOUS | Status: DC
  Administered 2013-01-06 (×3): 2 [IU] via SUBCUTANEOUS
  Administered 2013-01-07 – 2013-01-08 (×2): 1 [IU] via SUBCUTANEOUS
  Administered 2013-01-08 – 2013-01-09 (×2): 2 [IU] via SUBCUTANEOUS
  Administered 2013-01-09 – 2013-01-10 (×3): 1 [IU] via SUBCUTANEOUS

## 2013-01-05 MED ORDER — CARVEDILOL 3.125 MG PO TABS
3.1250 mg | ORAL_TABLET | Freq: Two times a day (BID) | ORAL | Status: DC
  Administered 2013-01-05 – 2013-01-07 (×5): 3.125 mg via ORAL
  Filled 2013-01-05 (×7): qty 1

## 2013-01-05 MED ORDER — ASPIRIN EC 81 MG PO TBEC
81.0000 mg | DELAYED_RELEASE_TABLET | Freq: Every day | ORAL | Status: DC
  Administered 2013-01-05 – 2013-01-10 (×6): 81 mg via ORAL
  Filled 2013-01-05 (×6): qty 1

## 2013-01-05 MED ORDER — HYDRALAZINE HCL 20 MG/ML IJ SOLN
10.0000 mg | Freq: Once | INTRAMUSCULAR | Status: AC
  Administered 2013-01-05: 10 mg via INTRAVENOUS
  Filled 2013-01-05: qty 1

## 2013-01-05 MED ORDER — PNEUMOCOCCAL VAC POLYVALENT 25 MCG/0.5ML IJ INJ
0.5000 mL | INJECTION | INTRAMUSCULAR | Status: AC
  Administered 2013-01-06: 0.5 mL via INTRAMUSCULAR
  Filled 2013-01-05: qty 0.5

## 2013-01-05 NOTE — Consult Note (Addendum)
Richard [name] is an 42 y.o. male.   Chief Complaint: Shortness of breath. HPI: Richard [name] is a 42 y.o. male with no significant past medical history presented today initially to the urgent care with the complaint of shortness of breath worsening since last 4 weeks.  He mentions that one month ago he was treated her for upper respiratory tract infection with azithromycin. His symptoms resolved with that. But since then he has been having gradual worsening of shortness of breath on exertion. Since last 2 days the symptoms progressed to shortness of breath on minimal exertion. Today he woke up at rest shortness of breath.  He also has noted 17 pound weight gain over last 4 weeks, bilateral lower extended swelling which is progressively worsening and orthopnea. No chest pain or fever. Echocardiogram showed severe LV systolic dysfuction.   Past Medical History  Diagnosis Date  . Allergy   . Hypertension   . Obesity       History reviewed. No pertinent past surgical history.  Family History  Problem Relation Age of Onset  . Heart disease Father    Social History:  reports that he has never smoked. He does not have any smokeless tobacco history on file. He reports that he does not drink alcohol or use illicit drugs.  Allergies: No Known Allergies  Medications Prior to Admission  Medication Sig Dispense Refill  . ibuprofen (ADVIL,MOTRIN) 200 MG tablet Take 200 mg by mouth every 6 (six) hours as needed for pain.      . naproxen sodium (ANAPROX) 220 MG tablet Take 220 mg by mouth 2 (two) times daily as needed. For pain        Results for orders placed during the hospital encounter of 01/03/13 (from the past 48 hour(s))  CBC     Status: Abnormal   Collection Time    01/03/13  8:34 PM      Result Value Range   WBC 6.3  4.0 - 10.5 K/uL   RBC 4.82  4.22 - 5.81 MIL/uL   Hemoglobin 13.2  13.0 - 17.0 g/dL   HCT 40.6  39.0 - 52.0 %   MCV 84.2  78.0 - 100.0 fL   MCH 27.4  26.0 - 34.0 pg    MCHC 32.5  30.0 - 36.0 g/dL   RDW 15.9 (*) 11.5 - 15.5 %   Platelets 206  150 - 400 K/uL  BASIC METABOLIC PANEL     Status: Abnormal   Collection Time    01/03/13  8:34 PM      Result Value Range   Sodium 138  135 - 145 mEq/L   Potassium 3.2 (*) 3.5 - 5.1 mEq/L   Chloride 100  96 - 112 mEq/L   CO2 23  19 - 32 mEq/L   Glucose, Bld 154 (*) 70 - 99 mg/dL   BUN 20  6 - 23 mg/dL   Creatinine, Ser 1.42 (*) 0.50 - 1.35 mg/dL   Calcium 8.4  8.4 - 10.5 mg/dL   GFR calc non Af Amer 60 (*) >90 mL/min   GFR calc Af Amer 69 (*) >90 mL/min   Comment:            The eGFR has been calculated     using the CKD EPI equation.     This calculation has not been     validated in all clinical     situations.     eGFR's persistently     <90 mL/min signify  possible Chronic Kidney Disease.  PRO B NATRIURETIC PEPTIDE     Status: Abnormal   Collection Time    01/03/13  8:37 PM      Result Value Range   Pro B Natriuretic peptide (BNP) 3496.0 (*) 0 - 125 pg/mL  POCT I-STAT TROPONIN I     Status: None   Collection Time    01/03/13  8:59 PM      Result Value Range   Troponin i, poc 0.04  0.00 - 0.08 ng/mL   Comment 3            Comment: Due to the release kinetics of cTnI,     a negative result within the first hours     of the onset of symptoms does not rule out     myocardial infarction with certainty.     If myocardial infarction is still suspected,     repeat the test at appropriate intervals.  PRO B NATRIURETIC PEPTIDE     Status: Abnormal   Collection Time    01/04/13 12:20 AM      Result Value Range   Pro B Natriuretic peptide (BNP) 3597.0 (*) 0 - 125 pg/mL  TSH     Status: None   Collection Time    01/04/13 12:20 AM      Result Value Range   TSH 2.169  0.350 - 4.500 uIU/mL  TROPONIN I     Status: None   Collection Time    01/04/13 12:20 AM      Result Value Range   Troponin I <0.30  <0.30 ng/mL   Comment:            Due to the release kinetics of cTnI,     a negative result  within the first hours     of the onset of symptoms does not rule out     myocardial infarction with certainty.     If myocardial infarction is still suspected,     repeat the test at appropriate intervals.  HEMOGLOBIN A1C     Status: Abnormal   Collection Time    01/04/13 12:20 AM      Result Value Range   Hemoglobin A1C 9.1 (*) <5.7 %   Comment: (NOTE)                                                                               According to the ADA Clinical Practice Recommendations for 2011, when     HbA1c is used as a screening test:      >=6.5%   Diagnostic of Diabetes Mellitus               (if abnormal result is confirmed)     5.7-6.4%   Increased risk of developing Diabetes Mellitus     References:Diagnosis and Classification of Diabetes Mellitus,Diabetes     [id number] 1):S62-S69 and Standards of Medical Care in             Diabetes - 2011,Diabetes Care,2011,34 (Suppl 1):S11-S61.   Mean Plasma Glucose 214 (*) <117 mg/dL  BASIC METABOLIC PANEL     Status: Abnormal   Collection Time    01/04/13 12:20 AM  Result Value Range   Sodium 142  135 - 145 mEq/L   Potassium 3.2 (*) 3.5 - 5.1 mEq/L   Chloride 102  96 - 112 mEq/L   CO2 29  19 - 32 mEq/L   Glucose, Bld 147 (*) 70 - 99 mg/dL   BUN 18  6 - 23 mg/dL   Creatinine, Ser 1.46 (*) 0.50 - 1.35 mg/dL   Calcium 8.3 (*) 8.4 - 10.5 mg/dL   GFR calc non Af Amer 58 (*) >90 mL/min   GFR calc Af Amer 67 (*) >90 mL/min   Comment:            The eGFR has been calculated     using the CKD EPI equation.     This calculation has not been     validated in all clinical     situations.     eGFR's persistently     <90 mL/min signify     possible Chronic Kidney Disease.  CBC     Status: Abnormal   Collection Time    01/04/13 12:20 AM      Result Value Range   WBC 7.2  4.0 - 10.5 K/uL   RBC 4.69  4.22 - 5.81 MIL/uL   Hemoglobin 12.8 (*) 13.0 - 17.0 g/dL   HCT 40.1  39.0 - 52.0 %   MCV 85.5  78.0 - 100.0 fL   MCH 27.3   26.0 - 34.0 pg   MCHC 31.9  30.0 - 36.0 g/dL   RDW 16.2 (*) 11.5 - 15.5 %   Platelets 210  150 - 400 K/uL  MAGNESIUM     Status: Abnormal   Collection Time    01/04/13 12:20 AM      Result Value Range   Magnesium 1.4 (*) 1.5 - 2.5 mg/dL  PHOSPHORUS     Status: None   Collection Time    01/04/13 12:20 AM      Result Value Range   Phosphorus 4.4  2.3 - 4.6 mg/dL  MRSA PCR SCREENING     Status: None   Collection Time    01/04/13  1:10 AM      Result Value Range   MRSA by PCR NEGATIVE  NEGATIVE   Comment:            The GeneXpert MRSA Assay (FDA     approved for NASAL specimens     only), is one component of a     comprehensive MRSA colonization     surveillance program. It is not     intended to diagnose MRSA     infection nor to guide or     monitor treatment for     MRSA infections.  TROPONIN I     Status: None   Collection Time    01/04/13  7:10 AM      Result Value Range   Troponin I <0.30  <0.30 ng/mL   Comment:            Due to the release kinetics of cTnI,     a negative result within the first hours     of the onset of symptoms does not rule out     myocardial infarction with certainty.     If myocardial infarction is still suspected,     repeat the test at appropriate intervals.  COMPREHENSIVE METABOLIC PANEL     Status: Abnormal   Collection Time    01/04/13  7:10 AM  Result Value Range   Sodium 142  135 - 145 mEq/L   Potassium 3.9  3.5 - 5.1 mEq/L   Chloride 103  96 - 112 mEq/L   CO2 29  19 - 32 mEq/L   Glucose, Bld 205 (*) 70 - 99 mg/dL   BUN 17  6 - 23 mg/dL   Creatinine, Ser 1.42 (*) 0.50 - 1.35 mg/dL   Calcium 8.2 (*) 8.4 - 10.5 mg/dL   Total Protein 6.0  6.0 - 8.3 g/dL   Albumin 2.9 (*) 3.5 - 5.2 g/dL   AST 32  0 - 37 U/L   ALT 46  0 - 53 U/L   Alkaline Phosphatase 45  39 - 117 U/L   Total Bilirubin 0.7  0.3 - 1.2 mg/dL   GFR calc non Af Amer 60 (*) >90 mL/min   GFR calc Af Amer 69 (*) >90 mL/min   Comment:            The eGFR has been  calculated     using the CKD EPI equation.     This calculation has not been     validated in all clinical     situations.     eGFR's persistently     <90 mL/min signify     possible Chronic Kidney Disease.  MAGNESIUM     Status: None   Collection Time    01/04/13  7:10 AM      Result Value Range   Magnesium 1.7  1.5 - 2.5 mg/dL  LIPID PANEL     Status: Abnormal   Collection Time    01/04/13  7:10 AM      Result Value Range   Cholesterol 134  0 - 200 mg/dL   Triglycerides 109  <150 mg/dL   HDL 30 (*) >39 mg/dL   Total CHOL/HDL Ratio 4.5     VLDL 22  0 - 40 mg/dL   LDL Cholesterol 82  0 - 99 mg/dL   Comment:            Total Cholesterol/HDL:CHD Risk     Coronary Heart Disease Risk Table                         Men   Women      1/2 Average Risk   3.4   3.3      Average Risk       5.0   4.4      2 X Average Risk   9.6   7.1      3 X Average Risk  23.4   11.0                Use the calculated Patient Ratio     above and the CHD Risk Table     to determine the patient's CHD Risk.                ATP III CLASSIFICATION (LDL):      <100     mg/dL   Optimal      100-129  mg/dL   Near or Above                        Optimal      130-159  mg/dL   Borderline      160-189  mg/dL   High      >190     mg/dL  Very High  GLUCOSE, CAPILLARY     Status: Abnormal   Collection Time    01/04/13  7:56 AM      Result Value Range   Glucose-Capillary 187 (*) 70 - 99 mg/dL  TROPONIN I     Status: None   Collection Time    01/04/13 12:55 PM      Result Value Range   Troponin I <0.30  <0.30 ng/mL   Comment:            Due to the release kinetics of cTnI,     a negative result within the first hours     of the onset of symptoms does not rule out     myocardial infarction with certainty.     If myocardial infarction is still suspected,     repeat the test at appropriate intervals.  BASIC METABOLIC PANEL     Status: Abnormal   Collection Time    01/05/13  4:15 AM      Result Value  Range   Sodium 142  135 - 145 mEq/L   Potassium 3.8  3.5 - 5.1 mEq/L   Comment: HEMOLYSIS AT THIS LEVEL MAY AFFECT RESULT   Chloride 101  96 - 112 mEq/L   CO2 29  19 - 32 mEq/L   Glucose, Bld 139 (*) 70 - 99 mg/dL   BUN 15  6 - 23 mg/dL   Creatinine, Ser 1.31  0.50 - 1.35 mg/dL   Calcium 8.6  8.4 - 10.5 mg/dL   GFR calc non Af Amer 66 (*) >90 mL/min   GFR calc Af Amer 76 (*) >90 mL/min   Comment:            The eGFR has been calculated     using the CKD EPI equation.     This calculation has not been     validated in all clinical     situations.     eGFR's persistently     <90 mL/min signify     possible Chronic Kidney Disease.  GLUCOSE, CAPILLARY     Status: Abnormal   Collection Time    01/05/13  7:30 AM      Result Value Range   Glucose-Capillary 156 (*) 70 - 99 mg/dL   Dg Chest 2 View  01/04/2013   *RADIOLOGY REPORT*  Clinical Data: Shortness of breath.  Bilateral lower extremity edema.  CHEST - 2 VIEW  Comparison: None.  Findings: Cardiac silhouette markedly enlarged.  Pulmonary venous hypertension and perhaps minimal interstitial pulmonary edema. Mild atelectasis in the left lower lobe.  Probable small bilateral pleural effusions.  Lateral image suboptimal due to underpenetration.  Mild degenerative changes involving the thoracic spine.  IMPRESSION: Marked cardiomegaly.  Pulmonary venous hypertension with perhaps minimal interstitial pulmonary edema.  Small bilateral effusions. Mild atelectasis in the left lower lobe.  Clinically significant discrepancy from primary report, if provided: None   Original Report Authenticated By: Evangeline Dakin, M.D.   US Renal  01/04/2013   *RADIOLOGY REPORT*  Clinical Data: Acute renal injury  RENAL/URINARY TRACT ULTRASOUND COMPLETE  Comparison:  None.  Findings:  Right Kidney:  10.2 cm in length.  No mass lesion or hydronephrosis is noted.  Left Kidney:  10.8 cm in greatest length.  No mass lesion or hydronephrosis is noted.  Evaluation of the  left kidney is somewhat limited secondary to patient's body habitus.  Bladder:  Well distended.  IMPRESSION: Somewhat limited exam although no gross abnormality is noted.  Original Report Authenticated By: Inez Catalina, M.D.   Dg Chest Port 1 View  01/03/2013   *RADIOLOGY REPORT*  Clinical Data: Respiratory distress  PORTABLE CHEST - 1 VIEW  Comparison: 01/03/2013 1718 hours  Findings: The cardiac shadow is enlarged.  The lungs are clear.  No focal infiltrate is noted.  Bony structures are within normal limits.  IMPRESSION: No acute abnormality noted.   Original Report Authenticated By: Inez Catalina, M.D.    @ROS @ + Weight gain, + Shortness of breath, + leg edema. All other system negative. Blood pressure 162/115, pulse 91, temperature 98 F (36.7 C), temperature source Oral, resp. rate 22, height 5\' 11"  (1.803 m), weight 152.8 kg (336 lb 13.8 oz), SpO2 100.00%. General: Alert, Awake and Oriented to Time, Place and Person. No distress  Eyes: Yolonda Kida  ENT: Oral Mucosa clear moist.  Neck: Difficult to elicit JVD, no Carotid Bruits. Cardiovascular: S1 and S2 Present, no Murmur, Peripheral Pulses Present  Respiratory: Bilateral Air entry equal and Decreased, minimal basal Crackles, no wheezes  Abdomen: Bowel Sound Present, Soft and Non tender, distended  Skin: No Rash  Extremities: Bilateral Pedal edema, no calf tenderness  Neurologic: Grossly Unremarkable.   Assessment/Plan Acute systolic heart failure Dilated cardiomyopathy R/O Ischemia Hypertension  Add B-blocker, Calcium channel blocker and vasodilators. Right and left heart cath in 1-2 days post additional diuresis.  Lonnell Chaput S 01/05/2013, 1:21 PM

## 2013-01-05 NOTE — Progress Notes (Signed)
Inpatient Diabetes Program Recommendations  AACE/ADA: New Consensus Statement on Inpatient Glycemic Control (2013)  Target Ranges:  Prepandial:   less than 140 mg/dL      Peak postprandial:   less than 180 mg/dL (1-2 hours)      Critically ill patients:  140 - 180 mg/dL   Reason for Visit: New onset diabetes  Inpatient Diabetes Program Recommendations Correction (SSI): May benefit from correction scale HgbA1C: A1C result 9.1 Outpatient Referral: Placed order for diabetes education follow-up at [facility].  Patient receptive to attending. Diet: Diet order has been changed to CHO Modified Medium.   Note: Talked with patient at bedside.  Has family history of diabetes-- mother, 1 living 1 deceased maternal uncle, and deceased father.  Patient states in hind-sight he should have suspected diabetes.  Discussed with patient that diabetes increased risk of heart disease and kidney disease and that it is vital that all of these problems are controlled as well as possible.    Patient has begun reading the Living with Diabetes Booklet.  Instructed him in accessing the Patient Education Network for diabetes videos.  Informed patient that he will need to check CBG's at home-- needs meter Rx at discharge.  Nursing staff to instruct regarding basic diabetes self-care.  Dietitian consult has been placed.  Will need specific activity instructions from cardiology at discharge.  Patient states with confidence that he can lose weight.  Will need PCP for diabetes follow-up at discharge-- placed Case Management consult for help in obtaining PCP.  Thank you.  Keshonda Monsour S. Marcelline Mates, RN, CNS, CDE Inpatient Diabetes Program, team pager [phone]

## 2013-01-05 NOTE — Progress Notes (Signed)
Utilization Review Completed.  

## 2013-01-05 NOTE — Progress Notes (Addendum)
Inpatient Diabetes Program Recommendations  AACE/ADA: New Consensus Statement on Inpatient Glycemic Control (2013)  Target Ranges:  Prepandial:   less than 140 mg/dL      Peak postprandial:   less than 180 mg/dL (1-2 hours)      Critically ill patients:  140 - 180 mg/dL   Reason for Visit: Consult from RN-- Patient with new-onset diabetes  Inpatient Diabetes Program Recommendations HgbA1C: A1C result 9.1  Note:  No history of diabetes noted.  Elevated A1C.  Request MD discuss diagnosis with patient, complete Glycemic Control Order Set, and add a diagnosis of diabetes to the Hospital Problem List.  Needs Dietitian consult and general diabetes education from nursing staff.  Will visit patient this afternoon assess appropriateness of outpatient diabetes education follow-up after discharge.  Thank you.  Zailyn Rowser S. Marcelline Mates, RN, CNS, CDE Inpatient Diabetes Program, team pager [phone]  MD, Please add CHO Modified Medium to diet order.  Thank you.  Lovell Roe S. Marcelline Mates, RN, CNS, CDE Inpatient Diabetes Program, team pager [phone]

## 2013-01-05 NOTE — Progress Notes (Signed)
Main discomfort with pt is th e lower leg edema. Education  started on elevatinglegs, dietary changes and HTN management. Awaiting transfer from SDU.

## 2013-01-05 NOTE — Progress Notes (Signed)
[facility] Progress Note [place] TEAM 1 - Stepdown/ICU TEAM   [place] [id number] DOB: Jun 21, 1970 DOA: 01/03/2013 PCP: No PCP Per Patient  Brief narrative: 42 year old male presented with complaint of severe shortness of breath which had been progressive along with pedal edema. The patient states that he's never had a diagnosis of hypertension (does not have a PCP) but on admission was noted to be severely hypertensive. He stated that he went to urgent care a while back and was diagnosed with a chest infection. Notes in Epic revealed that he was at urgent care on 6/28 and received Zithromax for bronchitis. No blood pressure was noted on that note but patient tells me that it was normal at that time.  Assessment/Plan:    Acute decompensated systolic heart failure/grade 3 diastolic dysfunction -Likely due to long-term uncontrolled blood pressure (dilated CM)-chest x-ray revealed severe cardiomegaly with boot-shaped heart. -ECHO revealed EF 25-30% with severe diffuse hypokinesis so need to r/o ischemic etiology-Cards has been consulted -Also has right heart dilatation and pulmonary HTN so could also be 2/2 undx'd OSA  -Was given 40 mg of IV Lasix 8/2 so add'l IV dose given- now on PO hydrodiuril- likely needs Lasix with Aldactone instead  -started on ACE I and Coreg this admit   Hypertensive emergency -Started on a nitroglycerin infusion at admission-now off -Was started on lisinopril/HCTZ 8/3  -Cards has added Coreg and Norvasc 8/4   New onset diabetes mellitus -HgbA1c 9.1 -diabetes educator following -well controlled on SSI -would eventually benefit from Metformin as renal function stabilizes-current GFR >60 -likely will need to dc on long acting insulin  CKD 2 -Likely hypertensive vs diabetic nephropathy Renal ultrasound ordered by admitting physician   Code Status: Full  Family Communication: None Disposition Plan: Transfer to  Telemetry  Consultants: Cardiology  Procedures: None  Antibiotics: none  DVT prophylaxis: Lovenox  HPI/Subjective: Patient is awake and alert and states he is feeling better than yesterday.  No current cp or severe sob.   Objective: Blood pressure 162/115, pulse 91, temperature 98 F (36.7 C), temperature source Oral, resp. rate 22, height 5\' 11"  (1.803 m), weight 152.8 kg (336 lb 13.8 oz), SpO2 100.00%.  Intake/Output Summary (Last 24 hours) at 01/05/13 1347 Last data filed at 01/05/13 1000  Gross per 24 hour  Intake   1520 ml  Output   1425 ml  Net     95 ml    Exam: General: No acute respiratory distress Lungs: Clear to auscultation bilaterally without wheezes or crackles Cardiovascular: Regular rate and rhythm without murmur gallop or rub normal S1 and S2 Abdomen: Nontender, nondistended, soft, bowel sounds positive, no rebound, no ascites, no appreciable mass Extremities: No significant cyanosis, clubbing - positive for edema bilateral lower extremities at 2+  Data Reviewed: Basic Metabolic Panel:  Recent Labs Lab 01/03/13 2034 01/04/13 0020 01/04/13 0710 01/05/13 0415  NA 138 142 142 142  K 3.2* 3.2* 3.9 3.8  CL 100 102 103 101  CO2 23 29 29 29   GLUCOSE 154* 147* 205* 139*  BUN 20 18 17 15   CREATININE 1.42* 1.46* 1.42* 1.31  CALCIUM 8.4 8.3* 8.2* 8.6  MG  --  1.4* 1.7  --   PHOS  --  4.4  --   --    Liver Function Tests:  Recent Labs Lab 01/04/13 0710  AST 32  ALT 46  ALKPHOS 45  BILITOT 0.7  PROT 6.0  ALBUMIN 2.9*   CBC:  Recent Labs Lab  01/03/13 2034 01/04/13 0020  WBC 6.3 7.2  HGB 13.2 12.8*  HCT 40.6 40.1  MCV 84.2 85.5  PLT 206 210   Cardiac Enzymes:  Recent Labs Lab 01/04/13 0020 01/04/13 0710 01/04/13 1255  TROPONINI <0.30 <0.30 <0.30   BNP (last 3 results)  Recent Labs  01/03/13 2037 01/04/13 0020  PROBNP 3496.0* 3597.0*   CBG:  Recent Labs Lab 01/04/13 0756 01/05/13 0730  GLUCAP 187* 156*     Recent Results (from the past 240 hour(s))  MRSA PCR SCREENING     Status: None   Collection Time    01/04/13  1:10 AM      Result Value Range Status   MRSA by PCR NEGATIVE  NEGATIVE Final   Comment:            The GeneXpert MRSA Assay (FDA     approved for NASAL specimens     only), is one component of a     comprehensive MRSA colonization     surveillance program. It is not     intended to diagnose MRSA     infection nor to guide or     monitor treatment for     MRSA infections.     Studies:  Recent x-ray studies have been reviewed in detail by the Attending Physician  Scheduled Meds:  Scheduled Meds: . amLODipine  5 mg Oral Daily  . carvedilol  3.125 mg Oral BID WC  . enoxaparin (LOVENOX) injection  80 mg Subcutaneous Q24H  . hydrochlorothiazide  25 mg Oral Daily  . lisinopril  20 mg Oral Daily  . sodium chloride  3 mL Intravenous Q12H    Time spent on care of this patient: 35 minute   ELLIS,ALLISON L., ANP  [facility] Office  [phone] Pager - Text Page per Shea Evans as per below:  On-Call/Text Page:      [email or web address]      password [email or web address]  If 7PM-7AM, please contact night-coverage [email or web address] Password [email or web address] 01/05/2013, 1:47 PM   LOS: 2 days   I have personally examined this patient and reviewed the entire database. I have reviewed the above note, made any necessary editorial changes, and agree with its content.  Cherene Altes, MD [facility]

## 2013-01-06 DIAGNOSIS — E119 Type 2 diabetes mellitus without complications: Secondary | ICD-10-CM

## 2013-01-06 HISTORY — DX: Type 2 diabetes mellitus without complications: E11.9

## 2013-01-06 LAB — CBC
HCT: 40.5 % (ref 39.0–52.0)
MCHC: 32.8 g/dL (ref 30.0–36.0)
Platelets: 211 10*3/uL (ref 150–400)
RDW: 16.3 % — ABNORMAL HIGH (ref 11.5–15.5)

## 2013-01-06 LAB — GLUCOSE, CAPILLARY
Glucose-Capillary: 170 mg/dL — ABNORMAL HIGH (ref 70–99)
Glucose-Capillary: 173 mg/dL — ABNORMAL HIGH (ref 70–99)

## 2013-01-06 LAB — BASIC METABOLIC PANEL
BUN: 12 mg/dL (ref 6–23)
Calcium: 8.5 mg/dL (ref 8.4–10.5)
Chloride: 98 mEq/L (ref 96–112)
Creatinine, Ser: 1.14 mg/dL (ref 0.50–1.35)
GFR calc Af Amer: >90 mL/min (ref >90)
GFR calc non Af Amer: 78 mL/min — ABNORMAL LOW (ref >90)

## 2013-01-06 MED ORDER — POTASSIUM CHLORIDE CRYS ER 20 MEQ PO TBCR
40.0000 meq | EXTENDED_RELEASE_TABLET | Freq: Three times a day (TID) | ORAL | Status: AC
  Administered 2013-01-06 (×3): 40 meq via ORAL
  Filled 2013-01-06 (×3): qty 2

## 2013-01-06 MED ORDER — SODIUM CHLORIDE 0.9 % IJ SOLN
3.0000 mL | Freq: Two times a day (BID) | INTRAMUSCULAR | Status: DC
  Administered 2013-01-06: 3 mL via INTRAVENOUS

## 2013-01-06 MED ORDER — DIAZEPAM 5 MG PO TABS
5.0000 mg | ORAL_TABLET | ORAL | Status: AC
  Administered 2013-01-07: 5 mg via ORAL
  Filled 2013-01-06: qty 1

## 2013-01-06 MED ORDER — SODIUM CHLORIDE 0.9 % IV SOLN: 250.0000 mL | INTRAVENOUS | Status: DC | PRN

## 2013-01-06 MED ORDER — SODIUM CHLORIDE 0.9 % IJ SOLN: 3.0000 mL | INTRAMUSCULAR | Status: DC | PRN

## 2013-01-06 NOTE — Plan of Care (Signed)
Problem: Food- and Nutrition-Related Knowledge Deficit (NB-1.1) Goal: Nutrition education Formal process to instruct or train a patient/client in a skill or to impart knowledge to help patients/clients voluntarily manage or modify food choices and eating behavior to maintain or improve health.  RD consulted for nutrition education regarding diabetes. Pt with new onset DM. He has been reading literature at his bedside and has a good understanding of the changes he needs to make. 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     Lab Results  Component Value Date    HGBA1C 9.1* 01/04/2013    RD provided "Carbohydrate Counting for People with Diabetes" handout from the Academy of [employer]. Discussed different food groups and their effects on blood sugar, emphasizing carbohydrate-containing foods. Provided list of carbohydrates and recommended serving sizes of common foods.  Discussed importance of controlled and consistent carbohydrate intake throughout the day. Provided examples of ways to balance meals/snacks and encouraged intake of high-fiber, whole grain complex carbohydrates. Teach back method used.  Expect good compliance.  RD also provided "Low Sodium Nutrition Therapy" handout from the Academy of [employer]etics. Reviewed patient's dietary recall. Provided examples on ways to decrease sodium intake in diet. Discouraged intake of processed foods and use of salt shaker. Encouraged fresh fruits and vegetables as well as whole grain sources of carbohydrates to maximize fiber intake.   RD discussed why it is important for patient to adhere to diet recommendations, and emphasized the role of fluids, foods to avoid, and importance of weighing self daily. Teach back method used.  Body mass index is 46.07 kg/(m^2). Pt meets criteria for Obese Class III based on current BMI.  Current diet order is Carbohydrate Modified Medium, patient is consuming  approximately 100% of meals at this time. Labs and medications reviewed. No further nutrition interventions warranted at this time. RD contact information provided. If additional nutrition issues arise, please re-consult RD.  Inda Coke MS, RD, LDN Pager: [phone] After-hours pager: [phone]

## 2013-01-06 NOTE — Progress Notes (Signed)
Pt admitted from stepdown unit per wheelchair assisted by RN Maudie Mercury.  AOX4 , VS taken and weighed. Oriented to room and call bell. Not complaining of any pain, not in respiratory distress. Will continue to monitor pt.

## 2013-01-06 NOTE — Progress Notes (Signed)
Subjective:  Feeling better. Afebrile.  Objective:  Vital Signs in the last 24 hours: Temp:  [97.3 F (36.3 C)-98.3 F (36.8 C)] 97.6 F (36.4 C) (08/05 1400) Pulse Rate:  [83-97] 97 (08/05 1656) Cardiac Rhythm:  [-] Normal sinus rhythm (08/05 1100) Resp:  [18] 18 (08/05 1400) BP: (143-169)/(92-121) 157/114 mmHg (08/05 1656) SpO2:  [96 %-99 %] 97 % (08/05 1400) Weight:  [149.778 kg (330 lb 3.2 oz)-152.046 kg (335 lb 3.2 oz)] 149.778 kg (330 lb 3.2 oz) (08/05 0646)  Physical Exam: BP Readings from Last 1 Encounters:  01/06/13 157/114     Wt Readings from Last 1 Encounters:  01/06/13 149.778 kg (330 lb 3.2 oz)    Weight change:   HEENT: [place]/AT, Eyes-Brown, PERL, EOMI, Conjunctiva-Pink, Sclera-Non-icteric Neck: No JVD, No bruit, Trachea midline. Lungs:  Clear, Bilateral. Cardiac:  Regular rhythm, normal S1 and S2, no S3.  Abdomen:  Soft, non-tender. Extremities:  Trace edema present. No cyanosis. No clubbing. CNS: AxOx3, Cranial nerves grossly intact, moves all 4 extremities. Right handed. Skin: Warm and dry.   Intake/Output from previous day: 08/04 0701 - 08/05 0700 In: 1740 [P.O.:1740] Out: 4455 [Urine:4455]    Lab Results: BMET    Component Value Date/Time   NA 139 01/06/2013 0500   K 3.1* 01/06/2013 0500   CL 98 01/06/2013 0500   CO2 28 01/06/2013 0500   GLUCOSE 159* 01/06/2013 0500   BUN 12 01/06/2013 0500   CREATININE 1.14 01/06/2013 0500   CALCIUM 8.5 01/06/2013 0500   GFRNONAA 78* 01/06/2013 0500   GFRAA >90 01/06/2013 0500   CBC    Component Value Date/Time   WBC 7.4 01/06/2013 0500   WBC 7.0 01/03/2013 1800   RBC 4.69 01/06/2013 0500   RBC 4.81 01/03/2013 1800   HGB 13.3 01/06/2013 0500   HGB 13.0* 01/03/2013 1800   HCT 40.5 01/06/2013 0500   HCT 42.9* 01/03/2013 1800   PLT 211 01/06/2013 0500   MCV 86.4 01/06/2013 0500   MCV 89.2 01/03/2013 1800   MCH 28.4 01/06/2013 0500   MCH 27.0 01/03/2013 1800   MCHC 32.8 01/06/2013 0500   MCHC 30.3* 01/03/2013 1800   RDW 16.3* 01/06/2013 0500    CARDIAC ENZYMES Lab Results  Component Value Date   TROPONINI <0.30 01/04/2013    Scheduled Meds: . amLODipine  5 mg Oral Daily  . aspirin EC  81 mg Oral Daily  . carvedilol  3.125 mg Oral BID WC  . enoxaparin (LOVENOX) injection  80 mg Subcutaneous Q24H  . furosemide  40 mg Intravenous Q12H  . insulin aspart  0-5 Units Subcutaneous QHS  . insulin aspart  0-9 Units Subcutaneous TID WC  . lisinopril  20 mg Oral Daily  . potassium chloride  40 mEq Oral TID  . sodium chloride  3 mL Intravenous Q12H   Continuous Infusions: . nitroGLYCERIN Stopped (01/04/13 1055)   PRN Meds:.acetaminophen, acetaminophen, hydrALAZINE, senna  Assessment/Plan:  Acute systolic heart failure  Dilated cardiomyopathy  R/O Ischemia  Hypertension Hypokalemia  Right and left heart cath in AM.   LOS: 3 days    Dixie Dials  MD  01/06/2013, 6:27 PM

## 2013-01-06 NOTE — Progress Notes (Signed)
[facility] Progress Note [place] TEAM 1 - Stepdown/ICU TEAM   [name] [id number] DOB: [birth year] DOA: 01/03/2013 PCP: No PCP Per Patient  Brief narrative: 42 year old male presented with complaint of severe shortness of breath which had been progressive along with pedal edema. The patient states that he's never had a diagnosis of hypertension (does not have a PCP) but on admission was noted to be severely hypertensive. He stated that he went to urgent care a while back and was diagnosed with a chest infection. Notes in Epic revealed that he was at urgent care on 6/28 and received Zithromax for bronchitis. No blood pressure was noted on that note but patient tells me that it was normal at that time.  Assessment/Plan:    Acute decompensated systolic heart failure/grade 3 diastolic dysfunction -Likely due to long-term uncontrolled blood pressure (dilated CM)-chest x-ray revealed severe cardiomegaly with boot-shaped heart. -ECHO revealed EF 25-30% with severe diffuse hypokinesis so need to r/o ischemic etiology-Cards has been consulted-for cath this admit -Also has right heart dilatation and pulmonary HTN so could also be 2/2 undx'd OSA  -Was given 40 mg of IV Lasix 8/2 so add'l IV dose given- now on PO hydrodiuril- likely needs Lasix with Aldactone instead  -has diuresed ~ 5 L since admission -started on ACE I and Coreg this admit   Hypertensive emergency -Started on a nitroglycerin infusion at admission-now off -Was started on lisinopril/HCTZ 8/3  -Cards has added Coreg and Norvasc 8/4   New onset diabetes mellitus -HgbA1c 9.1 -diabetes educator following -well controlled on SSI -would eventually benefit from Metformin as renal function stabilizes-current GFR >60 -consider beginning Metformin 48 hours after cath done  CKD 2 -Likely hypertensive vs diabetic nephropathy Renal ultrasound ordered by admitting physician   Code Status: Full  Family Communication:  None Disposition Plan: Telemetry  Consultants: Cardiology  Procedures: None  Antibiotics: none  DVT prophylaxis: Lovenox  HPI/Subjective: Patient is awake and alert and states he is feeling better than yesterday.  No current cp or severe sob.   Objective: Blood pressure 169/103, pulse 86, temperature 97.3 F (36.3 C), temperature source Oral, resp. rate 18, height 5\' 11"  (1.803 m), weight 149.778 kg (330 lb 3.2 oz), SpO2 99.00%.  Intake/Output Summary (Last 24 hours) at 01/06/13 1340 Last data filed at 01/06/13 1101  Gross per 24 hour  Intake    593 ml  Output   3630 ml  Net  -3037 ml    Exam: General: No acute respiratory distress Lungs: Clear to auscultation bilaterally without wheezes or crackles Cardiovascular: Regular rate and rhythm without murmur gallop or rub normal S1 and S2 Abdomen: Nontender, nondistended, soft, bowel sounds positive, no rebound, no ascites, no appreciable mass Extremities: No significant cyanosis, clubbing - positive for edema bilateral lower extremities at 1+  Data Reviewed: Basic Metabolic Panel:  Recent Labs Lab 01/03/13 2034 01/04/13 0020 01/04/13 0710 01/05/13 0415 01/06/13 0500  NA 138 142 142 142 139  K 3.2* 3.2* 3.9 3.8 3.1*  CL 100 102 103 101 98  CO2 23 29 29 29 28   GLUCOSE 154* 147* 205* 139* 159*  BUN 20 18 17 15 12   CREATININE 1.42* 1.46* 1.42* 1.31 1.14  CALCIUM 8.4 8.3* 8.2* 8.6 8.5  MG  --  1.4* 1.7  --   --   PHOS  --  4.4  --   --   --    Liver Function Tests:  Recent Labs Lab 01/04/13 0710  AST 32  ALT 46  ALKPHOS 45  BILITOT 0.7  PROT 6.0  ALBUMIN 2.9*   CBC:  Recent Labs Lab 01/03/13 2034 01/04/13 0020 01/06/13 0500  WBC 6.3 7.2 7.4  HGB 13.2 12.8* 13.3  HCT 40.6 40.1 40.5  MCV 84.2 85.5 86.4  PLT 206 210 211   Cardiac Enzymes:  Recent Labs Lab 01/04/13 0020 01/04/13 0710 01/04/13 1255  TROPONINI <0.30 <0.30 <0.30   BNP (last 3 results)  Recent Labs  01/03/13 2037  01/04/13 0020  PROBNP 3496.0* 3597.0*   CBG:  Recent Labs Lab 01/05/13 0730 01/05/13 1616 01/05/13 2106 01/06/13 0632 01/06/13 1125  GLUCAP 156* 160* 179* 170* 156*    Recent Results (from the past 240 hour(s))  MRSA PCR SCREENING     Status: None   Collection Time    01/04/13  1:10 AM      Result Value Range Status   MRSA by PCR NEGATIVE  NEGATIVE Final   Comment:            The GeneXpert MRSA Assay (FDA     approved for NASAL specimens     only), is one component of a     comprehensive MRSA colonization     surveillance program. It is not     intended to diagnose MRSA     infection nor to guide or     monitor treatment for     MRSA infections.     Studies:  Recent x-ray studies have been reviewed in detail by the Attending Physician  Scheduled Meds:  Scheduled Meds: . amLODipine  5 mg Oral Daily  . aspirin EC  81 mg Oral Daily  . carvedilol  3.125 mg Oral BID WC  . enoxaparin (LOVENOX) injection  80 mg Subcutaneous Q24H  . furosemide  40 mg Intravenous Q12H  . insulin aspart  0-5 Units Subcutaneous QHS  . insulin aspart  0-9 Units Subcutaneous TID WC  . lisinopril  20 mg Oral Daily  . potassium chloride  40 mEq Oral TID  . sodium chloride  3 mL Intravenous Q12H    Time spent on care of this patient: 35 minute   ELLIS,ALLISON L., ANP  [facility] Office  [phone] Pager - Text Page per Shea Evans as per below:  On-Call/Text Page:      [email or web address]      password [email or web address]  If 7PM-7AM, please contact night-coverage [email or web address] Password [email or web address] 01/06/2013, 1:40 PM   LOS: 3 days    I have examined the patient, reviewed the chart and modified the above note which I agree with.   Richard Wymore,MD [id number] 01/06/2013, 9:20 PM

## 2013-01-07 ENCOUNTER — Encounter (HOSPITAL_COMMUNITY)
Admission: EM | Disposition: A | Discharge status: Home / Self Care | Payer: Self-pay | Source: Home / Self Care | Attending: Internal Medicine

## 2013-01-07 HISTORY — PX: LEFT AND RIGHT HEART CATHETERIZATION WITH CORONARY ANGIOGRAM: SHX5449

## 2013-01-07 LAB — BASIC METABOLIC PANEL
BUN: 11 mg/dL (ref 6–23)
CO2: 29 mEq/L (ref 19–32)
Calcium: 8.5 mg/dL (ref 8.4–10.5)
Creatinine, Ser: 1.13 mg/dL (ref 0.50–1.35)

## 2013-01-07 LAB — GLUCOSE, CAPILLARY: Glucose-Capillary: 155 mg/dL — ABNORMAL HIGH (ref 70–99)

## 2013-01-07 LAB — POCT I-STAT 3, VENOUS BLOOD GAS (G3P V)
Acid-Base Excess: 6 mmol/L — ABNORMAL HIGH (ref 0.0–2.0)
Bicarbonate: 32.4 mEq/L — ABNORMAL HIGH (ref 20.0–24.0)
pCO2, Ven: 52.7 mmHg — ABNORMAL HIGH (ref 45.0–50.0)
pO2, Ven: 46 mmHg — ABNORMAL HIGH (ref 30.0–45.0)

## 2013-01-07 LAB — CBC
HCT: 40.2 % (ref 39.0–52.0)
Hemoglobin: 13.3 g/dL (ref 13.0–17.0)
MCH: 28.2 pg (ref 26.0–34.0)
MCHC: 33.1 g/dL (ref 30.0–36.0)
MCV: 85.4 fL (ref 78.0–100.0)
RBC: 4.71 MIL/uL (ref 4.22–5.81)

## 2013-01-07 LAB — POCT I-STAT 3, ART BLOOD GAS (G3+)
Acid-Base Excess: 6 mmol/L — ABNORMAL HIGH (ref 0.0–2.0)
Bicarbonate: 31.9 mEq/L — ABNORMAL HIGH (ref 20.0–24.0)
pH, Arterial: 7.4 (ref 7.350–7.450)

## 2013-01-07 LAB — PROTIME-INR: Prothrombin Time: 14 seconds (ref 11.6–15.2)

## 2013-01-07 SURGERY — LEFT AND RIGHT HEART CATHETERIZATION WITH CORONARY ANGIOGRAM
Anesthesia: LOCAL

## 2013-01-07 MED ORDER — LABETALOL HCL 5 MG/ML IV SOLN
20.0000 mg | Freq: Once | INTRAVENOUS | Status: AC
  Administered 2013-01-07: 20 mg via INTRAVENOUS
  Filled 2013-01-07: qty 4

## 2013-01-07 MED ORDER — HEPARIN (PORCINE) IN NACL 2-0.9 UNIT/ML-% IJ SOLN
INTRAMUSCULAR | Status: AC
  Filled 2013-01-07: qty 1000

## 2013-01-07 MED ORDER — NITROGLYCERIN 0.2 MG/ML ON CALL CATH LAB
INTRAVENOUS | Status: AC
  Filled 2013-01-07: qty 1

## 2013-01-07 MED ORDER — GLIPIZIDE 5 MG PO TABS
5.0000 mg | ORAL_TABLET | Freq: Every day | ORAL | Status: DC
  Administered 2013-01-08 – 2013-01-10 (×3): 5 mg via ORAL
  Filled 2013-01-07 (×5): qty 1

## 2013-01-07 MED ORDER — HYDRALAZINE HCL 20 MG/ML IJ SOLN: 10.0000 mg | Freq: Four times a day (QID) | INTRAMUSCULAR | Status: DC | PRN

## 2013-01-07 MED ORDER — FUROSEMIDE 10 MG/ML IJ SOLN
INTRAMUSCULAR | Status: AC
  Filled 2013-01-07: qty 4

## 2013-01-07 MED ORDER — AMLODIPINE BESYLATE 5 MG PO TABS
5.0000 mg | ORAL_TABLET | Freq: Two times a day (BID) | ORAL | Status: DC
  Administered 2013-01-07 – 2013-01-10 (×6): 5 mg via ORAL
  Filled 2013-01-07 (×8): qty 1

## 2013-01-07 MED ORDER — HYDRALAZINE HCL 20 MG/ML IJ SOLN
INTRAMUSCULAR | Status: AC
  Filled 2013-01-07: qty 1

## 2013-01-07 MED ORDER — FUROSEMIDE 10 MG/ML IJ SOLN
60.0000 mg | Freq: Three times a day (TID) | INTRAMUSCULAR | Status: DC
  Administered 2013-01-08 – 2013-01-09 (×4): 60 mg via INTRAVENOUS
  Filled 2013-01-07 (×7): qty 6

## 2013-01-07 MED ORDER — MIDAZOLAM HCL 2 MG/2ML IJ SOLN
INTRAMUSCULAR | Status: AC
  Filled 2013-01-07: qty 2

## 2013-01-07 MED ORDER — CARVEDILOL 6.25 MG PO TABS
6.2500 mg | ORAL_TABLET | Freq: Two times a day (BID) | ORAL | Status: DC
  Administered 2013-01-08 – 2013-01-10 (×6): 6.25 mg via ORAL
  Filled 2013-01-07 (×9): qty 1

## 2013-01-07 MED ORDER — FENTANYL CITRATE 0.05 MG/ML IJ SOLN
INTRAMUSCULAR | Status: AC
  Filled 2013-01-07: qty 2

## 2013-01-07 MED ORDER — LIDOCAINE HCL (PF) 1 % IJ SOLN
INTRAMUSCULAR | Status: AC
  Filled 2013-01-07: qty 30

## 2013-01-07 MED ORDER — ONDANSETRON HCL 4 MG/2ML IJ SOLN: 4.0000 mg | Freq: Four times a day (QID) | INTRAMUSCULAR | Status: DC | PRN

## 2013-01-07 MED ORDER — POTASSIUM CHLORIDE CRYS ER 20 MEQ PO TBCR
40.0000 meq | EXTENDED_RELEASE_TABLET | Freq: Two times a day (BID) | ORAL | Status: AC
  Administered 2013-01-07 (×2): 40 meq via ORAL
  Filled 2013-01-07: qty 1
  Filled 2013-01-07 (×2): qty 2

## 2013-01-07 NOTE — CV Procedure (Signed)
PROCEDURE:  Right and left heart catheterization with selective coronary angiography, left ventriculogram and cardiac output study.  CLINICAL HISTORY:  This is a 42 year old male with dilated cardiomyopathy and shortness of breath.  The risks, benefits, and details of the procedure were explained to the patient.  The patient verbalized understanding and wanted to proceed.  Informed written consent was obtained.  PROCEDURE TECHNIQUE:  The patient was approached from the right femoral artery using a 5 French short sheath and right femoral vein using a 7 [place] short sheath.  Gordy Councilman catheter was used for right heart presures and cardiac output study. Left coronary angiography was done using a Judkins L4 guide catheter.  Right coronary angiography was done using a Judkins R4 guide catheter.  Left ventriculography was done using a pigtail catheter.    CONTRAST:  Total of 46 cc.  COMPLICATIONS:  None.  At the end of the procedure a manual device was used for hemostasis.    HEMODYNAMICS:  Aortic pressure was 144/102; LV pressure was 141/13; LVEDP 26.  There was no gradient between the left ventricle and aorta.  PA pressure was 59/19, Wedge - 27, RV - 62/5 and RA - 15. Cardiac output was 9.96 by fick and 6.67 by thermal.  ANGIOGRAM/CORONARY ARTERIOGRAM:   The left main coronary artery is unremarkable.  The left anterior descending artery is unremarkable.  The left circumflex artery is unremarkable.  The right coronary artery is dominant and unremarkable.  LEFT VENTRICULOGRAM:  Left ventricular angiogram was done in the 30 RAO projection and revealed mild generalized hypokinesia of left ventricular wall and mild systolicdys function with an estimated ejection fraction of 45%.  LVEDP was 26 mmHg.  IMPRESSION OF HEART CATHETERIZATION:   1. Normal left main coronary artery. 2. Normal left anterior descending artery and its branches. 3. Normal left circumflex artery and its branches. 4. Normal  right coronary artery. 5. Mild left ventricular systolic dysfunction.  LVEDP 26 mmHg.  Ejection fraction 45 %. 6. Moderate pulmonary systolic hypertension with elevated left heart pressures.  RECOMMENDATION:   Life style modification, vasodilators and other blood pressure lowering agents. Marland Kitchen

## 2013-01-07 NOTE — Progress Notes (Addendum)
[facility] Progress Note [place] TEAM 1 - Stepdown/ICU TEAM   [name] [id number] DOB: Jan 14, 1971 DOA: 01/03/2013 PCP: No PCP Per Patient  Brief narrative: 42 year old male presented with complaint of severe shortness of breath which had been progressive along with pedal edema. The patient stated that he'd never had a diagnosis of hypertension (does not have a PCP) but on admission was noted to be severely hypertensive. He stated that he went to an urgent care a while back and was diagnosed with a chest infection. Notes in Epic revealed that he was at urgent care on 6/28 and received Zithromax for bronchitis. No blood pressure was noted on that note.  Assessment/Plan:  Acute decompensated systolic heart failure / grade 3 diastolic dysfunction -Likely due to long-term uncontrolled blood pressure (dilated CM) -chest x-ray revealed severe cardiomegaly with boot-shaped heart. -ECHO revealed EF 25-30% with severe diffuse hypokinesis -EF improved to 45% on cath LV gram -no CAD on cath -Also has right heart dilatation and pulmonary HTN so could also be 2/2 undx'd OSA  -RH pressures were up in cath so given extra Lasix 8/6 -cont IV Lasix BID- may require Aldactone as well -has diuresed ~ 8.5 L since admission - appears to still have plenty of room for further diureses (PCWP elevated at 27 8/6)  - will need to monitor renal fxn while pushing on w/ diuresis  -started on ACE I and Coreg this admit -if stall in attempts to diurese, will consider CHF Team consult   Hypertensive emergency -Started on a nitroglycerin infusion at admission - now off -was started on lisinopril/HCTZ 8/3 but later decided to dc HCTZ in favor of Lasix -added Coreg and Norvasc  -follow BP w/ ongoing diuresis   Newly diagnosed diabetes mellitus -HgbA1c 9.1 -diabetes educator following -well controlled on SSI -initiate glucotrol today and follow trend   -LDL borderline - advised on heart healthy diet and  weight loss - recheck lipids in 6-8 months and consider med tx at that time if not improved   CKD 2 -Likely hypertensive vs diabetic nephropathy -Renal ultrasound without gross abnormality appreciated -crt improving with diuresis - GFR approaching normal at this time   Elevated R heart pressures ?simply due to marked volume overload - would benefit from outpt sleep study to help rule out OHS/SA as well  Lifestyle modification Pt has been counseled extensively as to the link between his weight and his HTN, CHF, and DM   Code Status: FULL Family Communication: None Disposition Plan: Telemetry - d/c once pt appears to be at maximum diuresis/dry weight - suspect an additional 48hrs will be required given extent of R heart pressure elevation  Consultants: Cardiology  Procedures: R & L Heart cath 8/6  Antibiotics: none  DVT prophylaxis: Lovenox  HPI/Subjective: Patient is awake and alert and states he is feeling better than yesterday.  No current cp or severe sob.  Objective: Blood pressure 140/74, pulse 88, temperature 98.2 F (36.8 C), temperature source Oral, resp. rate 18, height 5\' 11"  (1.803 m), weight 144.8 kg (319 lb 3.6 oz), SpO2 98.00%.  Intake/Output Summary (Last 24 hours) at 01/07/13 1447 Last data filed at 01/07/13 1300  Gross per 24 hour  Intake    203 ml  Output   2800 ml  Net  -2597 ml    Exam: General: No acute respiratory distress at rest  Lungs: Clear to auscultation bilaterally without wheezes or crackles Cardiovascular: Regular rate and rhythm without murmur gallop or rub normal S1  and S2 Abdomen: Nontender, nondistended, soft, bowel sounds positive, no rebound, no ascites, no appreciable mass Extremities: No significant cyanosis, clubbing - positive for edema bilateral lower extremities at 2+  Data Reviewed: Basic Metabolic Panel:  Recent Labs Lab 01/03/13 2034 01/04/13 0020 01/04/13 0710 01/05/13 0415 01/06/13 0500 01/07/13 0430  NA 138  142 142 142 139 140  K 3.2* 3.2* 3.9 3.8 3.1* 3.3*  CL 100 102 103 101 98 100  CO2 23 29 29 29 28 29   GLUCOSE 154* 147* 205* 139* 159* 149*  BUN 20 18 17 15 12 11   CREATININE 1.42* 1.46* 1.42* 1.31 1.14 1.13  CALCIUM 8.4 8.3* 8.2* 8.6 8.5 8.5  MG  --  1.4* 1.7  --   --   --   PHOS  --  4.4  --   --   --   --    Liver Function Tests:  Recent Labs Lab 01/04/13 0710  AST 32  ALT 46  ALKPHOS 45  BILITOT 0.7  PROT 6.0  ALBUMIN 2.9*   CBC:  Recent Labs Lab 01/03/13 2034 01/04/13 0020 01/06/13 0500 01/07/13 0430  WBC 6.3 7.2 7.4 7.5  HGB 13.2 12.8* 13.3 13.3  HCT 40.6 40.1 40.5 40.2  MCV 84.2 85.5 86.4 85.4  PLT 206 210 211 214   Cardiac Enzymes:  Recent Labs Lab 01/04/13 0020 01/04/13 0710 01/04/13 1255  TROPONINI <0.30 <0.30 <0.30   BNP (last 3 results)  Recent Labs  01/03/13 2037 01/04/13 0020  PROBNP 3496.0* 3597.0*   CBG:  Recent Labs Lab 01/06/13 1125 01/06/13 1641 01/06/13 2137 01/07/13 0637 01/07/13 1212  GLUCAP 156* 173* 156* 140* 137*    Recent Results (from the past 240 hour(s))  MRSA PCR SCREENING     Status: None   Collection Time    01/04/13  1:10 AM      Result Value Range Status   MRSA by PCR NEGATIVE  NEGATIVE Final   Comment:            The GeneXpert MRSA Assay (FDA     approved for NASAL specimens     only), is one component of a     comprehensive MRSA colonization     surveillance program. It is not     intended to diagnose MRSA     infection nor to guide or     monitor treatment for     MRSA infections.     Studies:  Recent x-ray studies have been reviewed in detail by the Attending Physician  Scheduled Meds:  Scheduled Meds: . amLODipine  5 mg Oral BID  . aspirin EC  81 mg Oral Daily  . carvedilol  3.125 mg Oral BID WC  . enoxaparin (LOVENOX) injection  80 mg Subcutaneous Q24H  . furosemide  40 mg Intravenous Q12H  . insulin aspart  0-5 Units Subcutaneous QHS  . insulin aspart  0-9 Units Subcutaneous TID  WC  . lisinopril  20 mg Oral Daily  . potassium chloride  40 mEq Oral BID  . sodium chloride  3 mL Intravenous Q12H    Time spent on care of this patient: 35 minutes   ELLIS,ALLISON L., ANP  [facility] Office  [phone] Pager - Text Page per Shea Evans as per below:  On-Call/Text Page:      [email or web address]      password [email or web address]  If 7PM-7AM, please contact night-coverage [email or web address] Password [email or web address] 01/07/2013, 2:47 PM   LOS: 4 days   I have  personally examined this patient and reviewed the entire database. I have reviewed the above note, made any necessary editorial changes, and agree with its content.  Cherene Altes, MD [facility]

## 2013-01-07 NOTE — Plan of Care (Signed)
Problem: Phase I Progression Outcomes Goal: Hemodynamically stable Outcome: Progressing Patient continues to have a consistently high BP.  Asymptomatic.  Monitoring closely.  NPO at present for cath.  Procedure discussed with patient, he voiced understanding.  Cardiac cath video watched by patient.  No complications this shift.

## 2013-01-07 NOTE — Progress Notes (Addendum)
Pt returned from Cath lab per stretcher moved to bed using Hover matt and 4 assist . Pt alert/oriented Rt groin site clean and dry Level " 0 "

## 2013-01-08 LAB — GLUCOSE, CAPILLARY
Glucose-Capillary: 138 mg/dL — ABNORMAL HIGH (ref 70–99)
Glucose-Capillary: 87 mg/dL (ref 70–99)

## 2013-01-08 MED ORDER — POTASSIUM CHLORIDE CRYS ER 20 MEQ PO TBCR
40.0000 meq | EXTENDED_RELEASE_TABLET | Freq: Once | ORAL | Status: AC
  Administered 2013-01-08: 40 meq via ORAL
  Filled 2013-01-08: qty 2

## 2013-01-08 MED FILL — Labetalol HCl IV Soln 5 MG/ML: INTRAVENOUS | Qty: 4 | Status: AC

## 2013-01-08 NOTE — Progress Notes (Signed)
[facility] PROGRESS NOTE  Richard [name] [id number] DOB: [birth year] DOA: 01/03/2013 PCP: No PCP Per Patient  Assessment/Plan: Brief narrative:  42 year old male presented with complaint of severe shortness of breath which had been progressive along with pedal edema. The patient stated that he'd never had a diagnosis of hypertension (does not have a PCP) but on admission was noted to be severely hypertensive. He stated that he went to an urgent care a while back and was diagnosed with a chest infection. Notes in Epic revealed that he was at urgent care on 6/28 and received Zithromax for bronchitis. No blood pressure was noted on that note.   Assessment/Plan:  Acute decompensated systolic heart failure / grade 3 diastolic dysfunction  -Likely due to long-term uncontrolled blood pressure (dilated CM)  -chest x-ray revealed severe cardiomegaly with boot-shaped heart.  -ECHO revealed EF 25-30% with severe diffuse hypokinesis -EF improved to 45% on cath LV gram  -no CAD on cath  -Also has right heart dilatation and pulmonary HTN so could also be 2/2 undx'd OSA  -RH pressures were up in cath so given extra Lasix 8/6  -cont IV Lasix BID-  -has diuresed ~ 8.5 L+ since admission - appears to still have plenty of room for further diureses-will need to monitor renal fxn while pushing on w/ diuresis  -started on ACE I and Coreg this admit  -if stall in attempts to diurese, will consider CHF Team consult   Hypertensive emergency  -Started on a nitroglycerin infusion at admission - now off  -was started on lisinopril/HCTZ 8/3 but later decided to dc HCTZ in favor of Lasix  -added Coreg and Norvasc  -follow BP w/ ongoing diuresis   Newly diagnosed diabetes mellitus  -HgbA1c 9.1  -diabetes educator following  -well controlled on SSI  -initiate glucotrol today and follow trend  -LDL borderline - advised on heart healthy diet and weight loss - recheck lipids in 6-8 months and consider med tx at  that time if not improved   CKD 2  -Likely hypertensive vs diabetic nephropathy  -Renal ultrasound without gross abnormality appreciated  -crt improving with diuresis - GFR approaching normal at this time   Elevated R heart pressures  ?simply due to marked volume overload - would benefit from outpt sleep study to help rule out OHS/SA as well   Lifestyle modification  Pt has been counseled extensively as to the link between his weight and his HTN, CHF, and DM    Code Status: FULL  Family Communication: None  Disposition Plan: Telemetry - d/c once pt appears to be at maximum diuresis/dry weight - suspect an additional 48hrs will be required given extent of R heart pressure elevation   Consultants:  Cardiology   Procedures:  R & L Heart cath 8/6   Antibiotics:  none   DVT prophylaxis:  Lovenox    HPI/Subjective: Feeling well No new c/o- breathing better  Objective: Filed Vitals:   01/08/13 0931  BP: 133/76  Pulse: 86  Temp:   Resp:     Intake/Output Summary (Last 24 hours) at 01/08/13 1117 Last data filed at 01/08/13 1100  Gross per 24 hour  Intake   1803 ml  Output   3775 ml  Net  -1972 ml   Filed Weights   01/06/13 0646 01/07/13 0603 01/08/13 0641  Weight: 149.778 kg (330 lb 3.2 oz) 144.8 kg (319 lb 3.6 oz) 140.4 kg (309 lb 8.4 oz)    Exam:  General: No acute respiratory distress  at rest  Lungs: Clear to auscultation bilaterally without wheezes or crackles  Cardiovascular: Regular rate and rhythm without murmur gallop or rub normal S1 and S2  Abdomen: Nontender, nondistended, soft, bowel sounds positive, no rebound, no ascites, no appreciable mass  Extremities: No significant cyanosis, clubbing - positive for edema bilateral lower extremities at 2+   Data Reviewed: Basic Metabolic Panel:  Recent Labs Lab 01/03/13 2034 01/04/13 0020 01/04/13 0710 01/05/13 0415 01/06/13 0500 01/07/13 0430  NA 138 142 142 142 139 140  K 3.2* 3.2* 3.9 3.8 3.1*  3.3*  CL 100 102 103 101 98 100  CO2 23 29 29 29 28 29   GLUCOSE 154* 147* 205* 139* 159* 149*  BUN 20 18 17 15 12 11   CREATININE 1.42* 1.46* 1.42* 1.31 1.14 1.13  CALCIUM 8.4 8.3* 8.2* 8.6 8.5 8.5  MG  --  1.4* 1.7  --   --   --   PHOS  --  4.4  --   --   --   --    Liver Function Tests:  Recent Labs Lab 01/04/13 0710  AST 32  ALT 46  ALKPHOS 45  BILITOT 0.7  PROT 6.0  ALBUMIN 2.9*   No results found for this basename: LIPASE, AMYLASE,  in the last 168 hours No results found for this basename: AMMONIA,  in the last 168 hours CBC:  Recent Labs Lab 01/03/13 2034 01/04/13 0020 01/06/13 0500 01/07/13 0430  WBC 6.3 7.2 7.4 7.5  HGB 13.2 12.8* 13.3 13.3  HCT 40.6 40.1 40.5 40.2  MCV 84.2 85.5 86.4 85.4  PLT 206 210 211 214   Cardiac Enzymes:  Recent Labs Lab 01/04/13 0020 01/04/13 0710 01/04/13 1255  TROPONINI <0.30 <0.30 <0.30   BNP (last 3 results)  Recent Labs  01/03/13 2037 01/04/13 0020  PROBNP 3496.0* 3597.0*   CBG:  Recent Labs Lab 01/07/13 1212 01/07/13 1549 01/07/13 2054 01/08/13 0550 01/08/13 1042  GLUCAP 137* 155* 140* 138* 87    Recent Results (from the past 240 hour(s))  MRSA PCR SCREENING     Status: None   Collection Time    01/04/13  1:10 AM      Result Value Range Status   MRSA by PCR NEGATIVE  NEGATIVE Final   Comment:            The GeneXpert MRSA Assay (FDA     approved for NASAL specimens     only), is one component of a     comprehensive MRSA colonization     surveillance program. It is not     intended to diagnose MRSA     infection nor to guide or     monitor treatment for     MRSA infections.     Studies: No results found.  Scheduled Meds: . amLODipine  5 mg Oral BID  . aspirin EC  81 mg Oral Daily  . carvedilol  6.25 mg Oral BID WC  . enoxaparin (LOVENOX) injection  80 mg Subcutaneous Q24H  . furosemide  60 mg Intravenous Q8H  . glipiZIDE  5 mg Oral QAC breakfast  . insulin aspart  0-5 Units  Subcutaneous QHS  . insulin aspart  0-9 Units Subcutaneous TID WC  . lisinopril  20 mg Oral Daily  . potassium chloride  40 mEq Oral Once  . sodium chloride  3 mL Intravenous Q12H   Continuous Infusions:   Principal Problem:   Acute decompensated heart failure Active Problems:  Hypertensive emergency   CKD (chronic kidney disease) stage 2, GFR 60-89 ml/min   DM (diabetes mellitus)    Time spent: 35 min    Caroline Longie  [facility] Pager [phone]. If 7PM-7AM, please contact night-coverage at [email or web address], password [email or web address] 01/08/2013, 11:17 AM  LOS: 5 days

## 2013-01-08 NOTE — Progress Notes (Signed)
Patient had 5 beats of v-tach informed md; pt supplemented with potassium and will continue to monitor.

## 2013-01-08 NOTE — Progress Notes (Signed)
Pt. A/Ox4 and rested in bedside chair throughout the shift. He had no c/o pain. He is independent.

## 2013-01-09 LAB — BASIC METABOLIC PANEL
BUN: 11 mg/dL (ref 6–23)
BUN: 12 mg/dL (ref 6–23)
CO2: 32 mEq/L (ref 19–32)
Calcium: 8.5 mg/dL (ref 8.4–10.5)
Chloride: 96 mEq/L (ref 96–112)
Creatinine, Ser: 1.26 mg/dL (ref 0.50–1.35)
GFR calc Af Amer: 80 mL/min — ABNORMAL LOW (ref >90)
GFR calc Af Amer: 79 mL/min — ABNORMAL LOW (ref >90)
GFR calc non Af Amer: 69 mL/min — ABNORMAL LOW (ref >90)
Glucose, Bld: 135 mg/dL — ABNORMAL HIGH (ref 70–99)
Potassium: 3.2 mEq/L — ABNORMAL LOW (ref 3.5–5.1)
Potassium: 2.9 mEq/L — ABNORMAL LOW (ref 3.5–5.1)

## 2013-01-09 LAB — CBC
HCT: 42.6 % (ref 39.0–52.0)
Hemoglobin: 14 g/dL (ref 13.0–17.0)
MCH: 28.2 pg (ref 26.0–34.0)
MCHC: 32.9 g/dL (ref 30.0–36.0)
MCV: 85.7 fL (ref 78.0–100.0)
RDW: 15.7 % — ABNORMAL HIGH (ref 11.5–15.5)

## 2013-01-09 LAB — PRO B NATRIURETIC PEPTIDE: Pro B Natriuretic peptide (BNP): 1144 pg/mL — ABNORMAL HIGH (ref 0–125)

## 2013-01-09 LAB — GLUCOSE, CAPILLARY
Glucose-Capillary: 151 mg/dL — ABNORMAL HIGH (ref 70–99)
Glucose-Capillary: 140 mg/dL — ABNORMAL HIGH (ref 70–99)

## 2013-01-09 MED ORDER — FUROSEMIDE 40 MG PO TABS
40.0000 mg | ORAL_TABLET | Freq: Two times a day (BID) | ORAL | Status: DC
  Administered 2013-01-09 – 2013-01-10 (×3): 40 mg via ORAL
  Filled 2013-01-09 (×5): qty 1

## 2013-01-09 MED ORDER — MAGNESIUM SULFATE 40 MG/ML IJ SOLN
2.0000 g | Freq: Once | INTRAMUSCULAR | Status: AC
  Administered 2013-01-09: 2 g via INTRAVENOUS
  Filled 2013-01-09: qty 50

## 2013-01-09 MED ORDER — POTASSIUM CHLORIDE CRYS ER 20 MEQ PO TBCR
40.0000 meq | EXTENDED_RELEASE_TABLET | ORAL | Status: AC
  Administered 2013-01-09 (×4): 40 meq via ORAL
  Filled 2013-01-09 (×3): qty 2

## 2013-01-09 NOTE — Progress Notes (Signed)
Pt.AOx4 and ambulates independently. He had no c/o pain or discomfort during the shift.

## 2013-01-09 NOTE — Progress Notes (Signed)
[facility] PROGRESS NOTE  Richard [name] [id number] DOB: 1971/01/10 DOA: 01/03/2013 PCP: No PCP Per Patient  Assessment/Plan: Brief narrative:  42 year old male presented with complaint of severe shortness of breath which had been progressive along with pedal edema. The patient stated that he'd never had a diagnosis of hypertension (does not have a PCP) but on admission was noted to be severely hypertensive. He stated that he went to an urgent care a while back and was diagnosed with a chest infection. Notes in Epic revealed that he was at urgent care on 6/28 and received Zithromax for bronchitis. No blood pressure was noted on that note.   Assessment/Plan:  Acute decompensated systolic heart failure / grade 3 diastolic dysfunction  -Likely due to long-term uncontrolled blood pressure (dilated CM)  -chest x-ray revealed severe cardiomegaly with boot-shaped heart.  -ECHO revealed EF 25-30% with severe diffuse hypokinesis -EF improved to 45% on cath LV gram  -no CAD on cath  -Also has right heart dilatation and pulmonary HTN so could also be 2/2 undx'd OSA  -check BNP and change lasix to PO  -has diuresed ~ 8.5 L+ since admission - monitor renal fxn while pushing on w/ diuresis  -started on ACE I and Coreg this admit     Hypertensive emergency  -Started on a nitroglycerin infusion at admission - now off  -was started on lisinopril/HCTZ 8/3 but later decided to dc HCTZ in favor of Lasix  -added Coreg and Norvasc  -follow BP w/ ongoing diuresis   Newly diagnosed diabetes mellitus  -HgbA1c 9.1  -diabetes educator following  -well controlled on SSI  -initiate glucotrol today and follow trend  -LDL borderline - advised on heart healthy diet and weight loss - recheck lipids in 6-8 months and consider med tx at that time if not improved   CKD 2  -Likely hypertensive vs diabetic nephropathy  -Renal ultrasound without gross abnormality appreciated  -crt improving with diuresis - GFR  approaching normal at this time   Elevated R heart pressures  ?simply due to marked volume overload - would benefit from outpt sleep study to help rule out OHS/SA as well   Lifestyle modification  Pt has been counseled extensively as to the link between his weight and his HTN, CHF, and DM    Code Status: FULL  Family Communication: None  Disposition Plan: d/c in AM?  Consultants:  Cardiology   Procedures:  R & L Heart cath 8/6   Antibiotics:  none   DVT prophylaxis:  Lovenox    HPI/Subjective: Feeling well No new c/o- breathing better  Objective: Filed Vitals:   01/09/13 0514  BP: 142/85  Pulse: 89  Temp: 98.2 F (36.8 C)  Resp: 20    Intake/Output Summary (Last 24 hours) at 01/09/13 1051 Last data filed at 01/09/13 0844  Gross per 24 hour  Intake   1446 ml  Output   3475 ml  Net  -2029 ml   Filed Weights   01/07/13 0603 01/08/13 0641 01/09/13 0514  Weight: 144.8 kg (319 lb 3.6 oz) 140.4 kg (309 lb 8.4 oz) 138.2 kg (304 lb 10.8 oz)    Exam:  General: No acute respiratory distress at rest  Lungs: Clear to auscultation bilaterally without wheezes or crackles  Cardiovascular: Regular rate and rhythm without murmur gallop or rub normal S1 and S2  Abdomen: Nontender, nondistended, soft, bowel sounds positive, no rebound, no ascites, no appreciable mass  Extremities: No significant cyanosis, clubbing - positive for edema bilateral  lower extremities at 2+   Data Reviewed: Basic Metabolic Panel:  Recent Labs Lab 01/03/13 2034 01/04/13 0020 01/04/13 0710 01/05/13 0415 01/06/13 0500 01/07/13 0430 01/08/13 1205 01/09/13 0427 01/09/13 0830  NA 138 142 142 142 139 140  --  142 141  K 3.2* 3.2* 3.9 3.8 3.1* 3.3*  --  3.2* 2.9*  CL 100 102 103 101 98 100  --  95* 96  CO2 23 29 29 29 28 29   --  33* 32  GLUCOSE 154* 147* 205* 139* 159* 149*  --  135* 152*  BUN 20 18 17 15 12 11   --  11 12  CREATININE 1.42* 1.46* 1.42* 1.31 1.14 1.13  --  1.26 1.27   CALCIUM 8.4 8.3* 8.2* 8.6 8.5 8.5  --  8.5 8.6  MG  --  1.4* 1.7  --   --   --  1.4*  --   --   PHOS  --  4.4  --   --   --   --   --   --   --    Liver Function Tests:  Recent Labs Lab 01/04/13 0710  AST 32  ALT 46  ALKPHOS 45  BILITOT 0.7  PROT 6.0  ALBUMIN 2.9*   No results found for this basename: LIPASE, AMYLASE,  in the last 168 hours No results found for this basename: AMMONIA,  in the last 168 hours CBC:  Recent Labs Lab 01/03/13 2034 01/04/13 0020 01/06/13 0500 01/07/13 0430 01/09/13 0427  WBC 6.3 7.2 7.4 7.5 7.2  HGB 13.2 12.8* 13.3 13.3 14.0  HCT 40.6 40.1 40.5 40.2 42.6  MCV 84.2 85.5 86.4 85.4 85.7  PLT 206 210 211 214 217   Cardiac Enzymes:  Recent Labs Lab 01/04/13 0020 01/04/13 0710 01/04/13 1255  TROPONINI <0.30 <0.30 <0.30   BNP (last 3 results)  Recent Labs  01/03/13 2037 01/04/13 0020  PROBNP 3496.0* 3597.0*   CBG:  Recent Labs Lab 01/08/13 0550 01/08/13 1042 01/08/13 1553 01/08/13 2056 01/09/13 0635  GLUCAP 138* 87 152* 143* 151*    Recent Results (from the past 240 hour(s))  MRSA PCR SCREENING     Status: None   Collection Time    01/04/13  1:10 AM      Result Value Range Status   MRSA by PCR NEGATIVE  NEGATIVE Final   Comment:            The GeneXpert MRSA Assay (FDA     approved for NASAL specimens     only), is one component of a     comprehensive MRSA colonization     surveillance program. It is not     intended to diagnose MRSA     infection nor to guide or     monitor treatment for     MRSA infections.     Studies: No results found.  Scheduled Meds: . amLODipine  5 mg Oral BID  . aspirin EC  81 mg Oral Daily  . carvedilol  6.25 mg Oral BID WC  . enoxaparin (LOVENOX) injection  80 mg Subcutaneous Q24H  . furosemide  40 mg Oral BID  . glipiZIDE  5 mg Oral QAC breakfast  . insulin aspart  0-5 Units Subcutaneous QHS  . insulin aspart  0-9 Units Subcutaneous TID WC  . lisinopril  20 mg Oral Daily  .  magnesium sulfate 1 - 4 g bolus IVPB  2 g Intravenous Once  . potassium chloride  40 mEq Oral Q2H  . sodium chloride  3 mL Intravenous Q12H   Continuous Infusions:   Principal Problem:   Acute decompensated heart failure Active Problems:   Hypertensive emergency   CKD (chronic kidney disease) stage 2, GFR 60-89 ml/min   DM (diabetes mellitus)    Time spent: 35 min    Richard [name], Richard [name]  [facility] Pager [phone]. If 7PM-7AM, please contact night-coverage at [email or web address], password [email or web address] 01/09/2013, 10:51 AM  LOS: 6 days

## 2013-01-10 LAB — BASIC METABOLIC PANEL
Chloride: 96 mEq/L (ref 96–112)
GFR calc Af Amer: 88 mL/min — ABNORMAL LOW (ref >90)
GFR calc non Af Amer: 76 mL/min — ABNORMAL LOW (ref >90)
Glucose, Bld: 154 mg/dL — ABNORMAL HIGH (ref 70–99)
Potassium: 3.8 mEq/L (ref 3.5–5.1)
Sodium: 138 mEq/L (ref 135–145)

## 2013-01-10 LAB — GLUCOSE, CAPILLARY: Glucose-Capillary: 129 mg/dL — ABNORMAL HIGH (ref 70–99)

## 2013-01-10 MED ORDER — ASPIRIN 81 MG PO TBEC: 81.0000 mg | DELAYED_RELEASE_TABLET | Freq: Every day | ORAL | Status: DC

## 2013-01-10 MED ORDER — AMLODIPINE BESYLATE 5 MG PO TABS: 5.0000 mg | ORAL_TABLET | Freq: Two times a day (BID) | ORAL | Status: DC

## 2013-01-10 MED ORDER — ACURA BLOOD GLUCOSE METER W/DEVICE KIT: PACK | Status: DC

## 2013-01-10 MED ORDER — FUROSEMIDE 40 MG PO TABS: 40.0000 mg | ORAL_TABLET | Freq: Two times a day (BID) | ORAL | Status: DC

## 2013-01-10 MED ORDER — GLIPIZIDE 5 MG PO TABS: 5.0000 mg | ORAL_TABLET | Freq: Every day | ORAL | Status: DC

## 2013-01-10 MED ORDER — POTASSIUM CHLORIDE ER 10 MEQ PO TBCR: 40.0000 meq | EXTENDED_RELEASE_TABLET | Freq: Every day | ORAL | Status: DC

## 2013-01-10 MED ORDER — LISINOPRIL 20 MG PO TABS: 20.0000 mg | ORAL_TABLET | Freq: Every day | ORAL | Status: DC

## 2013-01-10 MED ORDER — METFORMIN HCL 500 MG PO TABS: 500.0000 mg | ORAL_TABLET | Freq: Two times a day (BID) | ORAL | Status: DC

## 2013-01-10 MED ORDER — CARVEDILOL 6.25 MG PO TABS: 6.2500 mg | ORAL_TABLET | Freq: Two times a day (BID) | ORAL | Status: DC

## 2013-01-10 NOTE — Progress Notes (Signed)
1430 discharge instructions  Given to pt Verbalized understanding

## 2013-01-10 NOTE — Progress Notes (Signed)
Pt. Alert and oriented this am. No s/s of distress or discomfort noted. Pt. Denies pain. Pt. Up to chair. Blood pressure 118/86, pulse 91, temperature 97.4 F (36.3 C), temperature source Oral, resp. rate 20, height 5\' 11"  (1.803 m), weight 138.9 kg (306 lb 3.5 oz), SpO2 97.00%. RN will continue to monitor pt. For changes in condition. Richard [name], Katherine Roan

## 2013-01-10 NOTE — Discharge Summary (Signed)
Physician Discharge Summary  Richard [name] [id number] DOB: Oct 18, 1970 DOA: 01/03/2013  PCP: No PCP Per Patient  Admit date: 01/03/2013 Discharge date: 01/10/2013  Time spent: 35 minutes  Recommendations for Outpatient Follow-up:  1. Outpatient OSA work up 2. FLP in few months 3. Titration of lasix/BP meds and DM meds 4. Follow BMP  Discharge Diagnoses:  Principal Problem:   Acute decompensated heart failure Active Problems:   Hypertensive emergency   CKD (chronic kidney disease) stage 2, GFR 60-89 ml/min   DM (diabetes mellitus)   Discharge Condition: improved  Diet recommendation: cardiac/diabetic  Filed Weights   01/08/13 0641 01/09/13 0514 01/10/13 0452  Weight: 140.4 kg (309 lb 8.4 oz) 138.2 kg (304 lb 10.8 oz) 138.9 kg (306 lb 3.5 oz)    History of present illness:  Richard [name] is a 42 y.o. male with no significant past medical history presented today initially to the urgent care with the complaint of shortness of breath that has been progressively worsening since last 4 weeks.  He mentions that one month ago he was treated her for upper respiratory tract infection with azithromycin. His symptoms resolved with that. But since then he has been having gradual worsening of shortness of breath on exertion. Since last 2 days the symptoms progressed to shortness of breath on minimal exertion. Today he woke up at rest shortness of breath.  He also has noted 17 pound weight gain over last 4 weeks, bilateral lower extended swelling which is progressively worsening and orthopnea.  He denies any chest pain, palpitation, dizziness, lightheadedness, headache, sore throat, fever, chills, leg pain, diarrhea, constipation, urinary symptoms.  He denies any use of illicit drugs or supplements.   Hospital Course:  Acute decompensated systolic heart failure / grade 3 diastolic dysfunction  -Likely due to long-term uncontrolled blood pressure (dilated CM)  -chest x-ray revealed severe  cardiomegaly with boot-shaped heart.  -ECHO revealed EF 25-30% with severe diffuse hypokinesis -EF improved to 45% on cath LV gram  -no CAD on cath  -Also has right heart dilatation and pulmonary HTN so could also be 2/2 undx'd OSA  -check BNP and change lasix to PO  -has diuresed ~ 8.5 L+ since admission - monitor renal fxn while pushing on w/ diuresis  -started on ACE I and Coreg this admit   Hypertensive emergency  -Started on a nitroglycerin infusion at admission - now off  -was started on lisinopril/HCTZ 8/3 but later decided to dc HCTZ in favor of Lasix  -added Coreg and Norvasc   Newly diagnosed diabetes mellitus  -HgbA1c 9.1  -diabetes educator following  -well controlled on SSI - patient did not want to be d/c'd on injectable insulin -initiate glucotrol today and follow trend - add metformin as outpatient -LDL borderline - advised on heart healthy diet and weight loss - recheck lipids in 6-8 months and consider med tx at that time if not improved   CKD 2  -Likely hypertensive vs diabetic nephropathy  -Renal ultrasound without gross abnormality appreciated  - improving with diuresis -  Elevated R heart pressures  ?simply due to marked volume overload - would benefit from outpt sleep study to help rule out OHS/SA as well   Lifestyle modification  Pt has been counseled extensively as to the link between his weight and his HTN, CHF, and DM   Procedures:  cath  Consultations:  cards  Discharge Exam: Filed Vitals:   01/10/13 1100  BP: 134/82  Pulse: 88  Temp:   Resp: 18  General: A+Ox3, NAD Cardiovascular: rrr Respiratory: clear anterior  Discharge Instructions      Discharge Orders   Future Appointments Provider Department Dept Phone   02/12/2013 4:30 PM Juliene Pina, RD [facility] [phone]   Future Orders Complete By Expires     Ambulatory referral to Nutrition and Diabetic Education  As directed      Comments:      New onset diabetes.    Diet - low sodium heart healthy  As directed     Diet Carb Modified  As directed     Discharge instructions  As directed     Comments:      Cbc, bmp in 1-2 week Outpatient OSA study    Increase activity slowly  As directed         Medication List    STOP taking these medications       ibuprofen 200 MG tablet  Commonly known as:  ADVIL,MOTRIN     naproxen sodium 220 MG tablet  Commonly known as:  ANAPROX      TAKE these medications       amLODipine 5 MG tablet  Commonly known as:  NORVASC  Take 1 tablet (5 mg total) by mouth 2 (two) times daily.     aspirin 81 MG EC tablet  Take 1 tablet (81 mg total) by mouth daily.     carvedilol 6.25 MG tablet  Commonly known as:  COREG  Take 1 tablet (6.25 mg total) by mouth 2 (two) times daily with a meal.     furosemide 40 MG tablet  Commonly known as:  LASIX  Take 1 tablet (40 mg total) by mouth 2 (two) times daily.     glipiZIDE 5 MG tablet  Commonly known as:  GLUCOTROL  Take 1 tablet (5 mg total) by mouth daily before breakfast.     lisinopril 20 MG tablet  Commonly known as:  PRINIVIL,ZESTRIL  Take 1 tablet (20 mg total) by mouth daily.     metFORMIN 500 MG tablet  Commonly known as:  GLUCOPHAGE  Take 1 tablet (500 mg total) by mouth 2 (two) times daily with a meal.     potassium chloride 10 MEQ tablet  Commonly known as:  K-DUR  Take 4 tablets (40 mEq total) by mouth daily.       No Known Allergies Follow-up Information   Please follow up. (family doctor in 1 week)       Follow up with [facility], MD In 1 week.   Contact information:   [address] [place] [address] [phone]        The results of significant diagnostics from this hospitalization (including imaging, microbiology, ancillary and laboratory) are listed below for reference.    Significant Diagnostic Studies: Dg Chest 2 View  01/04/2013   *RADIOLOGY REPORT*  Clinical Data:  Shortness of breath.  Bilateral lower extremity edema.  CHEST - 2 VIEW  Comparison: None.  Findings: Cardiac silhouette markedly enlarged.  Pulmonary venous hypertension and perhaps minimal interstitial pulmonary edema. Mild atelectasis in the left lower lobe.  Probable small bilateral pleural effusions.  Lateral image suboptimal due to underpenetration.  Mild degenerative changes involving the thoracic spine.  IMPRESSION: Marked cardiomegaly.  Pulmonary venous hypertension with perhaps minimal interstitial pulmonary edema.  Small bilateral effusions. Mild atelectasis in the left lower lobe.  Clinically significant discrepancy from primary report, if provided: None   Original Report Authenticated By: Marcello Moores  Purcell Nails, M.D.   US Renal  01/04/2013   *RADIOLOGY REPORT*  Clinical Data: Acute renal injury  RENAL/URINARY TRACT ULTRASOUND COMPLETE  Comparison:  None.  Findings:  Right Kidney:  10.2 cm in length.  No mass lesion or hydronephrosis is noted.  Left Kidney:  10.8 cm in greatest length.  No mass lesion or hydronephrosis is noted.  Evaluation of the left kidney is somewhat limited secondary to patient's body habitus.  Bladder:  Well distended.  IMPRESSION: Somewhat limited exam although no gross abnormality is noted.   Original Report Authenticated By: Inez Catalina, M.D.   Dg Chest Port 1 View  01/03/2013   *RADIOLOGY REPORT*  Clinical Data: Respiratory distress  PORTABLE CHEST - 1 VIEW  Comparison: 01/03/2013 1718 hours  Findings: The cardiac shadow is enlarged.  The lungs are clear.  No focal infiltrate is noted.  Bony structures are within normal limits.  IMPRESSION: No acute abnormality noted.   Original Report Authenticated By: Inez Catalina, M.D.    Microbiology: Recent Results (from the past 240 hour(s))  MRSA PCR SCREENING     Status: None   Collection Time    01/04/13  1:10 AM      Result Value Range Status   MRSA by PCR NEGATIVE  NEGATIVE Final   Comment:            The GeneXpert MRSA Assay  (FDA     approved for NASAL specimens     only), is one component of a     comprehensive MRSA colonization     surveillance program. It is not     intended to diagnose MRSA     infection nor to guide or     monitor treatment for     MRSA infections.     Labs: Basic Metabolic Panel:  Recent Labs Lab 01/03/13 2034 01/04/13 0020 01/04/13 0710  01/06/13 0500 01/07/13 0430 01/08/13 1205 01/09/13 0427 01/09/13 0830 01/10/13 0907  NA 138 142 142  < > 139 140  --  142 141 138  K 3.2* 3.2* 3.9  < > 3.1* 3.3*  --  3.2* 2.9* 3.8  CL 100 102 103  < > 98 100  --  95* 96 96  CO2 23 29 29   < > 28 29  --  33* 32 30  GLUCOSE 154* 147* 205*  < > 159* 149*  --  135* 152* 154*  BUN 20 18 17   < > 12 11  --  11 12 12   CREATININE 1.42* 1.46* 1.42*  < > 1.14 1.13  --  1.26 1.27 1.16  CALCIUM 8.4 8.3* 8.2*  < > 8.5 8.5  --  8.5 8.6 8.5  MG  --  1.4* 1.7  --   --   --  1.4*  --   --   --   PHOS  --  4.4  --   --   --   --   --   --   --   --   < > = values in this interval not displayed. Liver Function Tests:  Recent Labs Lab 01/04/13 0710  AST 32  ALT 46  ALKPHOS 45  BILITOT 0.7  PROT 6.0  ALBUMIN 2.9*   No results found for this basename: LIPASE, AMYLASE,  in the last 168 hours No results found for this basename: AMMONIA,  in the last 168 hours CBC:  Recent Labs Lab 01/03/13 2034 01/04/13 0020 01/06/13 0500 01/07/13 0430 01/09/13 0427  WBC 6.3 7.2 7.4 7.5 7.2  HGB 13.2 12.8* 13.3 13.3 14.0  HCT 40.6 40.1 40.5 40.2 42.6  MCV 84.2 85.5 86.4 85.4 85.7  PLT 206 210 211 214 217   Cardiac Enzymes:  Recent Labs Lab 01/04/13 0020 01/04/13 0710 01/04/13 1255  TROPONINI <0.30 <0.30 <0.30   BNP: BNP (last 3 results)  Recent Labs  01/03/13 2037 01/04/13 0020 01/09/13 0830  PROBNP 3496.0* 3597.0* 1144.0*   CBG:  Recent Labs Lab 01/09/13 1050 01/09/13 1556 01/09/13 2107 01/10/13 0622 01/10/13 1106  GLUCAP 112* 140* 141* 137* 113*       Signed:  Kymberlee Viger,  Shay Bartoli  [facility] 01/10/2013, 2:32 PM

## 2013-01-12 NOTE — Care Management Note (Unsigned)
    Page 1 of 1   01/12/2013     8:43:56 AM   CARE MANAGEMENT NOTE 01/12/2013  Patient:  BECKEM, ANCAR   Account Number:  [id number]  Date Initiated:  01/09/2013  Documentation initiated by:  Llana Aliment  Subjective/Objective Assessment:   42yo male admitted with CHF.     Action/Plan:   discharge summary   Anticipated DC Date:  01/09/2013   Anticipated DC Plan:  [place]  CM consult      Choice offered to / List presented to:             Status of service:  Completed, signed off Medicare Important Message given?   (If response is "NO", the following Medicare IM given date fields will be blank) Date Medicare IM given:   Date Additional Medicare IM given:    Discharge Disposition:  HOME/SELF CARE  Per UR Regulation:  Reviewed for med. necessity/level of care/duration of stay  If discussed at [place] of Stay Meetings, dates discussed:    Comments:  01/09/13 1430 In to speak with pt. about obtaining a PCP. Pt. was given a list of PCP from another CM, and pt. states he has called the [facility] [phone]) number and they will be calling him back with his appointment.  No other issues noted for pt. Llana Aliment, RN, BSN NCM [phone]

## 2013-01-12 NOTE — Progress Notes (Signed)
Utilization Review Completed.   Bob Eastwood, RN, BSN Nurse Case Manager  [phone]  

## 2013-01-15 ENCOUNTER — Ambulatory Visit (INDEPENDENT_AMBULATORY_CARE_PROVIDER_SITE_OTHER): Payer: BC Managed Care – PPO | Admitting: Family Medicine

## 2013-01-15 ENCOUNTER — Encounter: Payer: Self-pay | Admitting: Family Medicine

## 2013-01-15 VITALS — BP 136/82 | Temp 99.2°F | Ht 71.0 in | Wt 310.0 lb

## 2013-01-15 DIAGNOSIS — I509 Heart failure, unspecified: Secondary | ICD-10-CM

## 2013-01-15 DIAGNOSIS — I10 Essential (primary) hypertension: Secondary | ICD-10-CM

## 2013-01-15 DIAGNOSIS — N182 Chronic kidney disease, stage 2 (mild): Secondary | ICD-10-CM

## 2013-01-15 DIAGNOSIS — N058 Unspecified nephritic syndrome with other morphologic changes: Secondary | ICD-10-CM

## 2013-01-15 DIAGNOSIS — E669 Obesity, unspecified: Secondary | ICD-10-CM

## 2013-01-15 DIAGNOSIS — IMO0002 Reserved for concepts with insufficient information to code with codable children: Secondary | ICD-10-CM

## 2013-01-15 DIAGNOSIS — E1165 Type 2 diabetes mellitus with hyperglycemia: Secondary | ICD-10-CM

## 2013-01-15 DIAGNOSIS — E1129 Type 2 diabetes mellitus with other diabetic kidney complication: Secondary | ICD-10-CM

## 2013-01-15 LAB — BASIC METABOLIC PANEL
CO2: 27 mEq/L (ref 19–32)
Calcium: 8.7 mg/dL (ref 8.4–10.5)
GFR: 79 mL/min (ref >60.00)
Potassium: 3.2 mEq/L — ABNORMAL LOW (ref 3.5–5.1)
Sodium: 137 mEq/L (ref 135–145)

## 2013-01-15 NOTE — Progress Notes (Signed)
Chief Complaint  Patient presents with  . Establish Care    HPI:  Richard [name] is here to establish care. Recently in hospital from review of chart for decompensated heart failure also with new dx HTN, uncontrolled DM, HLD, CKD and likely OSA with elevated RH pressure. Had not seen physician in some time prior to hospital stay and reports had not really taken good care of himself. Reports feeling well since out of hospital. Doing 15 minutes of walking on treadmill and working up - feels fine. Denies CP, SOB, DOE, swelling - reports swelling has actually decreased, has lost a few lbs since out of hospital.   Acute decompensated HF, grade 3, CM, pulm HTN: -on echo in hospital - ef 25-30 --> 45% on cath LV gram -diuresed in hospital -started on asa, Acei and coreg in hospital and lasix, potassium -had follow up with cardiology outpt for management per discharge document - has appointment with cardiologist in a few days -reports told this was more likely related to weight, DM, HTN and recent URI with abx.  HTN: -initially urgency, on acei/HCTZ, coreg and norvasc since hospitalization  DM: -hgba1c 9.1 in hospital -started on glucotrol and metformin in the hoosptal -referred to nutrition/diabetes educator on Sept 11th -he has been tentative about checking BS -he is going is going to set up eye appointment  CKD: -had renal US in hospital ok -started on acei, Cr improved with diuresis  HLD: -lifestyle recs inpatient  -LDL was <100 in hospital, HDL was low  ? OSA: -told to get sleep study outptient, but reports told to hold off on this for 6 months, he wants to discuss when to do it with cardiologist -reports some snoring, denies apneic spells, unrestful sleep  Hypokal, in hospital: -potassium 3.8 on discharge, home on K+    Patient Active Problem List   Diagnosis Date Noted  . Essential hypertension, benign 01/15/2013  . DM (diabetes mellitus) 01/06/2013  . CKD (chronic kidney  disease) stage 2, GFR 60-89 ml/min 01/04/2013  . Acute decompensated heart failure 01/03/2013    Health Maintenance:  ROS: See pertinent positives and negatives per HPI.  Past Medical History  Diagnosis Date  . Allergy   . Hypertension   . Obesity   . CHF (congestive heart failure)   . Chronic kidney disease   . Acute decompensated heart failure 01/03/2013  . CKD (chronic kidney disease) stage 2, GFR 60-89 ml/min 01/04/2013  . DM (diabetes mellitus) 01/06/2013  . Hypertensive emergency 01/03/2013    Family History  Problem Relation Age of Onset  . Heart disease Father 40    MI  . Hypertension Father   . Hyperlipidemia Father   . Diabetes Mother   . Hypertension Mother     History   Social History  . Marital Status: Single    Spouse Name: N/A    Number of Children: N/A  . Years of Education: N/A   Social History Main Topics  . Smoking status: Never Smoker   . Smokeless tobacco: None  . Alcohol Use: No  . Drug Use: No  . Sexual Activity: None   Other Topics Concern  . None   Social History Narrative   Work or School: Engineer, agricultural, homeless Chief of Staff      Home Situation: lives alone      Spiritual Beliefs: Christian      Lifestyle: 15 minutes dialy walking - working up; working on [employer]  Current outpatient prescriptions:amLODipine (NORVASC) 5 MG tablet, Take 1 tablet (5 mg total) by mouth 2 (two) times daily., Disp: 60 tablet, Rfl: 0;  aspirin EC 81 MG EC tablet, Take 1 tablet (81 mg total) by mouth daily., Disp: , Rfl: ;  Blood Glucose Monitoring Suppl (ACURA BLOOD GLUCOSE METER) W/DEVICE KIT, 1 meter with lancets and test strips for daily BS testing QS 1 month, Disp: 1 kit, Rfl: 0 carvedilol (COREG) 6.25 MG tablet, Take 1 tablet (6.25 mg total) by mouth 2 (two) times daily with a meal., Disp: 60 tablet, Rfl: 0;  furosemide (LASIX) 40 MG tablet, Take 1 tablet (40 mg total) by mouth 2 (two) times daily., Disp: 60 tablet, Rfl: 0;   glipiZIDE (GLUCOTROL) 5 MG tablet, Take 1 tablet (5 mg total) by mouth daily before breakfast., Disp: 30 tablet, Rfl: 0 lisinopril (PRINIVIL,ZESTRIL) 20 MG tablet, Take 1 tablet (20 mg total) by mouth daily., Disp: 30 tablet, Rfl: 0;  metFORMIN (GLUCOPHAGE) 500 MG tablet, Take 1 tablet (500 mg total) by mouth 2 (two) times daily with a meal., Disp: 60 tablet, Rfl: 0;  ONE TOUCH ULTRA TEST test strip, , Disp: , Rfl: ;  ONETOUCH DELICA LANCETS [id number] MISC, , Disp: , Rfl:  potassium chloride (K-DUR) 10 MEQ tablet, Take 4 tablets (40 mEq total) by mouth daily., Disp: 30 tablet, Rfl: 0  EXAM:  Filed Vitals:   01/15/13 0927  BP: 136/82  Temp: 99.2 F (37.3 C)    Body mass index is 43.26 kg/(m^2).  GENERAL: vitals reviewed and listed above, alert, oriented, appears well hydrated and in no acute distress  HEENT: atraumatic, conjunttiva clear, no obvious abnormalities on inspection of external nose and ears  NECK: no obvious masses on inspection  LUNGS: clear to auscultation bilaterally, no wheezes, rales or rhonchi, good air movement  CV: HRRR, no peripheral edema  MS: moves all extremities without noticeable abnormality  PSYCH: pleasant and cooperative, no obvious depression or anxiety  ASSESSMENT AND PLAN:  Discussed the following assessment and plan:  DM (diabetes mellitus), type 2, uncontrolled, with renal complications - Plan: Basic metabolic panel  Essential hypertension, benign  CKD (chronic kidney disease) stage 2, GFR 60-89 ml/min  Obesity, unspecified  Congestive heart failure, unspecified  -feeling well and pursuing lifestyle changes - congratulated on lifestyle changes -advised continue current medications until sees cardiologist -We reviewed the PMH, PSH, FH, SH, Meds and Allergies. -We provided refills for any medications we will prescribe as needed. -We addressed current concerns per orders and patient instructions. -We have asked for records for pertinent exams,  studies, vaccines and notes from previous providers. -We have advised patient to follow up per instructions below -pt to follow up with Cardiology and Diabetes educator -continue current medication with titration of metformin to 1000mg  BID if kidney function ok -recheck BMP today -follow up with me in one month for diabetes -refer to pulm for sleep study offered - but he will discuss with cards per his preference -in meantime stressed importance of lifestyle changes, weight management ->45 minutes spent face to face counseling this patient  -Patient advised to return or notify a doctor immediately if symptoms worsen or persist or new concerns arise.  There are no Patient Instructions on file for this visit.   Colin Benton R.

## 2013-01-15 NOTE — Patient Instructions (Signed)
-  We have ordered labs or studies at this visit. It can take up to 1-2 weeks for results and processing. We will contact you with instructions IF your results are abnormal. Normal results will be released to your [facility]. If you have not heard from [place] or can not find your results in [facility] in 2 weeks please contact our office.  -PLEASE SIGN UP FOR MYCHART TODAY   We recommend the following healthy lifestyle measures: - eat a healthy diet consisting of lots of vegetables, fruits, beans, nuts, seeds, healthy meats such as white chicken and fish and whole grains.  - avoid fried foods, fast food, processed foods, sodas, red meet and other fattening foods.  - get a least 150 minutes of aerobic exercise per week.   See cardiologist as scheduled and discuss sleep study  See opthomologist for an eye exam  Keep appointment with diabetes educator  Start checking sugars, fasting (first thing in the morning) daily and randomly 1 hour after meals (postprandial) and keep a written log. Bring these to appointment with diabetes educator and to appointment with me in 1 month.  Follow up in: 1 month

## 2013-01-16 NOTE — Progress Notes (Signed)
Quick Note:  Left a message for return call. ______ 

## 2013-01-16 NOTE — Progress Notes (Signed)
Quick Note:  I spoke with pt went over the below information. Pt does not need any refills right now. ______

## 2013-01-16 NOTE — Addendum Note (Signed)
Addended by: Aggie Hacker A on: 01/16/2013 04:10 PM   Modules accepted: Orders

## 2013-02-04 ENCOUNTER — Telehealth: Payer: Self-pay | Admitting: Family Medicine

## 2013-02-04 MED ORDER — METFORMIN HCL 1000 MG PO TABS: 1000.0000 mg | ORAL_TABLET | Freq: Two times a day (BID) | ORAL | Status: DC

## 2013-02-04 NOTE — Telephone Encounter (Signed)
Called and spoke with pt and pt states he is taking 1 tablet bid.  Rx sent to pharmacy. Called and explained new metformin instructions to patient.

## 2013-02-04 NOTE — Telephone Encounter (Signed)
Patient requesting refill on Metformin 500mg  BID. He states he contacted [facility] but was not sure if they sent [place] the request.  Please send to: [facility] on [address].

## 2013-02-05 ENCOUNTER — Telehealth: Payer: Self-pay | Admitting: Family Medicine

## 2013-02-05 MED ORDER — AMLODIPINE BESYLATE 5 MG PO TABS: 5.0000 mg | ORAL_TABLET | Freq: Two times a day (BID) | ORAL | Status: DC

## 2013-02-05 MED ORDER — FUROSEMIDE 40 MG PO TABS: 40.0000 mg | ORAL_TABLET | Freq: Two times a day (BID) | ORAL | Status: DC

## 2013-02-05 MED ORDER — CARVEDILOL 6.25 MG PO TABS: 6.2500 mg | ORAL_TABLET | Freq: Two times a day (BID) | ORAL | Status: DC

## 2013-02-05 MED ORDER — GLIPIZIDE 5 MG PO TABS: 5.0000 mg | ORAL_TABLET | Freq: Every day | ORAL | Status: DC

## 2013-02-05 MED ORDER — LISINOPRIL 20 MG PO TABS: 20.0000 mg | ORAL_TABLET | Freq: Every day | ORAL | Status: DC

## 2013-02-05 NOTE — Telephone Encounter (Signed)
PT called and stated that he needs refills of the following medications; amLODipine (NORVASC) 5 MG tablet, carvedilol (COREG) 6.25 MG tablet, furosemide (LASIX) 40 MG tablet, glipiZIDE (GLUCOTROL) 5 MG tablet, and lisinopril (PRINIVIL,ZESTRIL) 20 MG tablet. He states that these where given to him at the hospital last month, but he was given no refills. He would like these sent to [facility] on [address]. Please assist.

## 2013-02-05 NOTE — Telephone Encounter (Signed)
Prescriptions sent to pharmacy for 1 year.

## 2013-02-12 ENCOUNTER — Encounter: Payer: BC Managed Care – PPO | Attending: Internal Medicine | Admitting: *Deleted

## 2013-02-12 ENCOUNTER — Encounter: Payer: Self-pay | Admitting: *Deleted

## 2013-02-12 VITALS — Ht 71.0 in | Wt 287.7 lb

## 2013-02-12 DIAGNOSIS — Z713 Dietary counseling and surveillance: Secondary | ICD-10-CM | POA: Insufficient documentation

## 2013-02-12 DIAGNOSIS — E119 Type 2 diabetes mellitus without complications: Secondary | ICD-10-CM | POA: Insufficient documentation

## 2013-02-12 NOTE — Progress Notes (Signed)
Medical Nutrition Therapy:  Appt start time: 1630 end time:  1730.  Assessment:  Primary concern today: Type 2 Diabetes and weight management. Patient with newly diagnosed type 2 diabetes with a HgbA1c of 9.1 in August 2014. He has a history of CHF, CKD stage 2, and hypertension. He is checking his blood glucose once daily in the AM with values usually less than 110. He reports that he has changed his diet in the last month, eliminating fast food and sweetened drinks, and watching portion size. 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 Weight loss is his primary goal. He has lost about 22 pounds in the last month, but this is likely from fluid loss as well as weight loss. His goal weight is 225 pounds.   MEDICATIONS: Metformin, glipizide, lasix    DIETARY INTAKE:   Usual eating pattern includes 3 meals and 2 snacks per day.  24-hr recall:  B ( AM): Cereal (Honey Nut Cheerios) with soy milk, 8 oz OJ (50 kcal), applesauce  Snk ( AM): Toast, boiled egg  L ( PM): [place] sandwich on wheat bread with lettuce, tomato, mustard, water/unsweetened ice tea Snk ( PM): 1/2 can fruit (mandarin oranges) D ( PM): Veggie Sub 6" Sub[employer] OR Chicken breast/tilapia, carrots/cauliflower Snk ( PM): None Beverages: Water, unsweetened tea, diet OJ  Usual physical activity: Calisthenics, resistance bands 20 minutes every morning and 45 minutes in the evening, weekends: recumbent bike/stair climber 40 minutes  Estimated energy needs: 1800 calories 203 g carbohydrates 113 g protein 60 g fat  Progress Towards Goal(s):  In progress.   Nutritional Diagnosis:  NB-1.1 Food and nutrition-related knowledge deficit As related to diabetes.  As evidenced by no prior education.    Intervention:  Nutrition counseling. We discussed basic carb counting, including foods with carbs, label reading, portion size, and meal planning. We also discussed strategies  for weight loss, including balancing nutrients (carbs, protein, fat), portion control, healthy snacks, exercise, and dining out.   Goals:  1. 3-4 carb portions at meals, 1 portion at snacks.  2. 1-2 pound weight loss per week. Goal weight: 225 pounds.  3. Continue to monitor portion size.  4. Increase intake of non-starchy vegetables.  5. Check blood glucose at alternating times of the day (postprandial) to determine response to meals.  6. Continue exercising at least 30-45 minutes at least 5 days weekly.  Handouts given during visit include:  Count Your Carb booklet  Yellow meal plan card  Monitoring/Evaluation:  Dietary intake, exercise, BG, and body weight prn.

## 2013-02-17 ENCOUNTER — Ambulatory Visit (INDEPENDENT_AMBULATORY_CARE_PROVIDER_SITE_OTHER): Payer: BC Managed Care – PPO | Admitting: Family Medicine

## 2013-02-17 ENCOUNTER — Encounter: Payer: Self-pay | Admitting: Family Medicine

## 2013-02-17 VITALS — BP 140/98 | Temp 97.7°F | Wt 290.0 lb

## 2013-02-17 DIAGNOSIS — E1169 Type 2 diabetes mellitus with other specified complication: Secondary | ICD-10-CM | POA: Insufficient documentation

## 2013-02-17 DIAGNOSIS — I10 Essential (primary) hypertension: Secondary | ICD-10-CM

## 2013-02-17 DIAGNOSIS — L853 Xerosis cutis: Secondary | ICD-10-CM

## 2013-02-17 DIAGNOSIS — E785 Hyperlipidemia, unspecified: Secondary | ICD-10-CM

## 2013-02-17 DIAGNOSIS — E1129 Type 2 diabetes mellitus with other diabetic kidney complication: Secondary | ICD-10-CM

## 2013-02-17 DIAGNOSIS — L738 Other specified follicular disorders: Secondary | ICD-10-CM

## 2013-02-17 DIAGNOSIS — N182 Chronic kidney disease, stage 2 (mild): Secondary | ICD-10-CM

## 2013-02-17 DIAGNOSIS — Z23 Encounter for immunization: Secondary | ICD-10-CM

## 2013-02-17 DIAGNOSIS — E118 Type 2 diabetes mellitus with unspecified complications: Secondary | ICD-10-CM | POA: Insufficient documentation

## 2013-02-17 DIAGNOSIS — I5022 Chronic systolic (congestive) heart failure: Secondary | ICD-10-CM

## 2013-02-17 DIAGNOSIS — N058 Unspecified nephritic syndrome with other morphologic changes: Secondary | ICD-10-CM

## 2013-02-17 DIAGNOSIS — I509 Heart failure, unspecified: Secondary | ICD-10-CM

## 2013-02-17 NOTE — Progress Notes (Signed)
No chief complaint on file.   HPI:  Follow up: Richard [name] recently establish care here and is here today for follow up:  DM/CKD: -recent Dx Lab Results  Component Value Date   HGBA1C 9.1* 01/04/2013  -he was started on asa, acei, metformin (1000 bid) and glucotrol, started exercising and was referred to nutrition last visit -home BS: fasting around low 100s; sometimes checks about 1 hour after dinner and is usually around 150; low 92, high 156 -eye exam: will set up this week -40 minutes of cardio 4x per week plus resistance and sit ups and push up; has been working on decreasing carbs -denies: polyuria, polydipsia, fevers, abd pain, neuropathy -has lost 23 lbs  HTN/HLD,CHF: -followed by cardiology - seen on 18th of August -LDL <100 -on asa, acei/HCTZ, coreg and norvasc -denies: CP, SOB, swelling improved, palpitations  ROS: See pertinent positives and negatives per HPI.  Past Medical History  Diagnosis Date  . Allergy   . Hypertension   . Obesity   . CHF (congestive heart failure)   . Chronic kidney disease   . Acute decompensated heart failure 01/03/2013  . CKD (chronic kidney disease) stage 2, GFR 60-89 ml/min 01/04/2013  . DM (diabetes mellitus) 01/06/2013  . Hypertensive emergency 01/03/2013  . Acute decompensated heart failure 01/03/2013    No past surgical history on file.  Family History  Problem Relation Age of Onset  . Heart disease Father 77    MI  . Hypertension Father   . Hyperlipidemia Father   . Diabetes Mother   . Hypertension Mother     History   Social History  . Marital Status: Single    Spouse Name: N/A    Number of Children: N/A  . Years of Education: N/A   Social History Main Topics  . Smoking status: Never Smoker   . Smokeless tobacco: None  . Alcohol Use: No  . Drug Use: No  . Sexual Activity: None   Other Topics Concern  . None   Social History Narrative   Work or School: Engineer, agricultural, Tax inspector       Home Situation: lives alone      Spiritual Beliefs: Christian      Lifestyle: 15 minutes dialy walking - working up; working on diet             Current outpatient prescriptions:amLODipine (NORVASC) 5 MG tablet, Take 1 tablet (5 mg total) by mouth 2 (two) times daily., Disp: 60 tablet, Rfl: 11;  aspirin EC 81 MG EC tablet, Take 1 tablet (81 mg total) by mouth daily., Disp: , Rfl: ;  carvedilol (COREG) 6.25 MG tablet, Take 1 tablet (6.25 mg total) by mouth 2 (two) times daily with a meal., Disp: 60 tablet, Rfl: 11 furosemide (LASIX) 40 MG tablet, Take 1 tablet (40 mg total) by mouth 2 (two) times daily., Disp: 60 tablet, Rfl: 11;  glipiZIDE (GLUCOTROL) 5 MG tablet, Take 1 tablet (5 mg total) by mouth daily before breakfast., Disp: 30 tablet, Rfl: 11;  lisinopril (PRINIVIL,ZESTRIL) 20 MG tablet, Take 1 tablet (20 mg total) by mouth daily., Disp: 30 tablet, Rfl: 11 metFORMIN (GLUCOPHAGE) 1000 MG tablet, Take 1 tablet (1,000 mg total) by mouth 2 (two) times daily with a meal., Disp: 180 tablet, Rfl: 3;  potassium chloride (K-DUR) 10 MEQ tablet, Take 4 tablets (40 mEq total) by mouth daily., Disp: 30 tablet, Rfl: 0;  Blood Glucose Monitoring Suppl (ACURA BLOOD GLUCOSE METER) W/DEVICE KIT,  1 meter with lancets and test strips for daily BS testing QS 1 month, Disp: 1 kit, Rfl: 0 ONE TOUCH ULTRA TEST test strip, , Disp: , Rfl: ;  ONETOUCH DELICA LANCETS 99991111 MISC, , Disp: , Rfl:   EXAM:  Filed Vitals:   02/17/13 0850  BP: 140/98  Temp: 97.7 F (36.5 C)    Body mass index is 40.46 kg/(m^2).  GENERAL: vitals reviewed and listed above, alert, oriented, appears well hydrated and in no acute distress  HEENT: atraumatic, conjunttiva clear, no obvious abnormalities on inspection of external nose and ears  NECK: no obvious masses on inspection  LUNGS: clear to auscultation bilaterally, no wheezes, rales or rhonchi, good air movement  CV: HRRR, no peripheral edema  MS: moves all extremities  without noticeable abnormality  PSYCH: pleasant and cooperative, no obvious depression or anxiety  ASSESSMENT AND PLAN:  Discussed the following assessment and plan:  CKD (chronic kidney disease) stage 2, GFR 60-89 ml/min  Type 2 diabetes, uncontrolled, with renal manifestation  Essential hypertension, benign  Hyperlipidemia LDL goal < 100  CHF (congestive heart failure), chronic, systolic  Dry skin  -congratulated on significant lifestyle changes and weight loss -will continue current diabetic medications with home BS monitoring and work to decrease carbs further -CHF, HTN - followed by carbs -flu and tdap vaccines given today -foot exan done today -follow up with me in 3 months and will repeat labs then -Patient advised to return or notify a doctor immediately if symptoms worsen or persist or new concerns arise.  Patient Instructions  We recommend the following healthy lifestyle measures: - eat a healthy diet consisting of lots of vegetables, fruits, beans, nuts, seeds, healthy meats such as white chicken and fish and whole grains.  - avoid fried foods, fast food, processed foods, sodas, red meet and other fattening foods.  - get a least 150 minutes of aerobic exercise per week.   Schedule eye exam  Continue current medication  Cerave Cream (not lotion) in a tub on feet and dry skin  Schedule early morning appointment in about 3 months and will check fasting labs then     Colin Benton R.

## 2013-02-17 NOTE — Patient Instructions (Addendum)
We recommend the following healthy lifestyle measures: - eat a healthy diet consisting of lots of vegetables, fruits, beans, nuts, seeds, healthy meats such as white chicken and fish and whole grains.  - avoid fried foods, fast food, processed foods, sodas, red meet and other fattening foods.  - get a least 150 minutes of aerobic exercise per week.   Schedule eye exam  Continue current medication  Cerave Cream (not lotion) in a tub on feet and dry skin  Schedule early morning appointment in about 3 months and will check fasting labs then

## 2013-03-04 LAB — HM DIABETES EYE EXAM

## 2013-03-18 ENCOUNTER — Telehealth: Payer: Self-pay | Admitting: Family Medicine

## 2013-03-18 DIAGNOSIS — E1129 Type 2 diabetes mellitus with other diabetic kidney complication: Secondary | ICD-10-CM

## 2013-03-18 NOTE — Telephone Encounter (Signed)
Pt needs new rx one touch ultra test strips call into [facility]. the patient check bs once a day.

## 2013-03-19 MED ORDER — ONETOUCH DELICA LANCETS 33G MISC: 1.0000 | Freq: Every day | Status: DC

## 2013-03-19 MED ORDER — GLUCOSE BLOOD VI STRP: ORAL_STRIP | Status: DC

## 2013-03-19 NOTE — Telephone Encounter (Signed)
Sent to pharmacy 

## 2013-05-01 ENCOUNTER — Encounter: Payer: Self-pay | Admitting: Family Medicine

## 2013-05-01 NOTE — Progress Notes (Signed)
Received a letter from [facility] about pt's amlodipine besylate.  Medication is covered from 04/15/13 to 04/16/2014.  Sent to scan.

## 2013-05-22 ENCOUNTER — Ambulatory Visit (INDEPENDENT_AMBULATORY_CARE_PROVIDER_SITE_OTHER): Payer: BC Managed Care – PPO | Admitting: Family Medicine

## 2013-05-22 ENCOUNTER — Encounter: Payer: Self-pay | Admitting: Family Medicine

## 2013-05-22 VITALS — BP 142/100 | Temp 98.6°F | Wt 287.0 lb

## 2013-05-22 DIAGNOSIS — N058 Unspecified nephritic syndrome with other morphologic changes: Secondary | ICD-10-CM

## 2013-05-22 DIAGNOSIS — N182 Chronic kidney disease, stage 2 (mild): Secondary | ICD-10-CM

## 2013-05-22 DIAGNOSIS — I10 Essential (primary) hypertension: Secondary | ICD-10-CM

## 2013-05-22 DIAGNOSIS — E1129 Type 2 diabetes mellitus with other diabetic kidney complication: Secondary | ICD-10-CM

## 2013-05-22 DIAGNOSIS — I509 Heart failure, unspecified: Secondary | ICD-10-CM

## 2013-05-22 LAB — BASIC METABOLIC PANEL
BUN: 14 mg/dL (ref 6–23)
Chloride: 104 mEq/L (ref 96–112)
Glucose, Bld: 109 mg/dL — ABNORMAL HIGH (ref 70–99)
Potassium: 3.5 mEq/L (ref 3.5–5.1)

## 2013-05-22 LAB — LIPID PANEL
Cholesterol: 170 mg/dL (ref 0–200)
HDL: 36.7 mg/dL — ABNORMAL LOW (ref >39.00)
LDL Cholesterol: 109 mg/dL — ABNORMAL HIGH (ref 0–99)

## 2013-05-22 LAB — MICROALBUMIN / CREATININE URINE RATIO: Microalb, Ur: 18.5 mg/dL — ABNORMAL HIGH (ref 0.0–1.9)

## 2013-05-22 LAB — HEMOGLOBIN A1C: Hgb A1c MFr Bld: 4.9 % (ref 4.6–6.5)

## 2013-05-22 NOTE — Progress Notes (Signed)
Pre visit review using our clinic review tool, if applicable. No additional management support is needed unless otherwise documented below in the visit note. 

## 2013-05-22 NOTE — Patient Instructions (Signed)
-  We have ordered labs or studies at this visit. It can take up to 1-2 weeks for results and processing. We will contact you with instructions IF your results are abnormal. Normal results will be released to your [facility]. If you have not heard from [place] or can not find your results in [facility] in 2 weeks please contact our office.   We recommend the following healthy lifestyle measures: - eat a healthy diet consisting of lots of vegetables, fruits, beans, nuts, seeds, healthy meats such as white chicken and fish and whole grains.  - avoid fried foods, fast food, processed foods, sodas, red meet and other fattening foods.  - get a least 150 minutes of aerobic exercise per week.   Follow up in: 4 months

## 2013-05-22 NOTE — Progress Notes (Signed)
Chief Complaint  Patient presents with  . 3 month follow up    HPI:  Follow up:  DM/HTN/CKD: -meds: asa, acei, metformin, glucotrol, bb, norvasc, HCTZ -Home BS: postprandial in low 100s -exercising and watching diet last visit with great weight loss at the time -now reports: saw his cardiologist yesterday and is going to be adjusting his coreg dose up due to bp running a little high, has not taken this increased dose or any of his medications this morning yet -cycling 60 minutes every other day and full body daily -denies: CP, SOB, palpitations, swelling, polyuria, vision issues, foot lesions -had eye exam in october  Most recent related labs: Lab Results  Component Value Date   HGBA1C 9.1* 01/04/2013   Lab Results  Component Value Date   CHOL 134 01/04/2013   HDL 30* 01/04/2013   LDLCALC 82 01/04/2013   TRIG 109 01/04/2013   CHOLHDL 4.5 01/04/2013    Lab Results  Component Value Date   CREATININE 1.3 01/15/2013      ROS: See pertinent positives and negatives per HPI.  Past Medical History  Diagnosis Date  . Allergy   . Hypertension   . Obesity   . CHF (congestive heart failure)   . Chronic kidney disease   . Acute decompensated heart failure 01/03/2013  . CKD (chronic kidney disease) stage 2, GFR 60-89 ml/min 01/04/2013  . DM (diabetes mellitus) 01/06/2013  . Hypertensive emergency 01/03/2013  . Acute decompensated heart failure 01/03/2013    No past surgical history on file.  Family History  Problem Relation Age of Onset  . Heart disease Father 7    MI  . Hypertension Father   . Hyperlipidemia Father   . Diabetes Mother   . Hypertension Mother     History   Social History  . Marital Status: Single    Spouse Name: N/A    Number of Children: N/A  . Years of Education: N/A   Social History Main Topics  . Smoking status: Never Smoker   . Smokeless tobacco: None  . Alcohol Use: No  . Drug Use: No  . Sexual Activity: None   Other Topics Concern  . 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 alone      Spiritual Beliefs: Christian      Lifestyle: 15 minutes dialy walking - working up; working on diet             Current outpatient prescriptions:amLODipine (NORVASC) 5 MG tablet, Take 1 tablet (5 mg total) by mouth 2 (two) times daily., Disp: 60 tablet, Rfl: 11;  aspirin EC 81 MG EC tablet, Take 1 tablet (81 mg total) by mouth daily., Disp: , Rfl: ;  Blood Glucose Monitoring Suppl (ACURA BLOOD GLUCOSE METER) W/DEVICE KIT, 1 meter with lancets and test strips for daily BS testing QS 1 month, Disp: 1 kit, Rfl: 0 carvedilol (COREG) 6.25 MG tablet, Take 1 tablet (6.25 mg total) by mouth 2 (two) times daily with a meal., Disp: 60 tablet, Rfl: 11;  furosemide (LASIX) 40 MG tablet, Take 1 tablet (40 mg total) by mouth 2 (two) times daily., Disp: 60 tablet, Rfl: 11;  glipiZIDE (GLUCOTROL) 5 MG tablet, Take 1 tablet (5 mg total) by mouth daily before breakfast., Disp: 30 tablet, Rfl: 11 glucose blood (ONE TOUCH ULTRA TEST) test strip, Test daily., Disp: 100 each, Rfl: 11;  lisinopril (  PRINIVIL,ZESTRIL) 20 MG tablet, Take 1 tablet (20 mg total) by mouth daily., Disp: 30 tablet, Rfl: 11;  metFORMIN (GLUCOPHAGE) 1000 MG tablet, Take 1 tablet (1,000 mg total) by mouth 2 (two) times daily with a meal., Disp: 180 tablet, Rfl: 3;  ONETOUCH DELICA LANCETS 99991111 MISC, 1 Device by Other route daily. Test daily., Disp: 100 each, Rfl: 11 potassium chloride (K-DUR) 10 MEQ tablet, Take 4 tablets (40 mEq total) by mouth daily., Disp: 30 tablet, Rfl: 0  EXAM:  Filed Vitals:   05/22/13 0850  BP: 142/100  Temp: 98.6 F (37 C)    Body mass index is 40.05 kg/(m^2).  GENERAL: vitals reviewed and listed above, alert, oriented, appears well hydrated and in no acute distress  HEENT: atraumatic, conjunttiva clear, no obvious abnormalities on inspection of external nose and  ears  NECK: no obvious masses on inspection  LUNGS: clear to auscultation bilaterally, no wheezes, rales or rhonchi, good air movement  CV: HRRR, no peripheral edema  MS: moves all extremities without noticeable abnormality  PSYCH: pleasant and cooperative, no obvious depression or anxiety  ASSESSMENT AND PLAN:  Discussed the following assessment and plan:  CKD (chronic kidney disease) stage 2, GFR 60-89 ml/min  Type 2 diabetes, uncontrolled, with renal manifestation - Plan: Hemoglobin A1c, Lipid Panel, Microalbumin/Creatinine Ratio, Urine  Essential hypertension, benign - Plan: Basic metabolic panel  CHF (congestive heart failure) - Plan: Lipid Panel  -doing well -NON-FASTING labs today -congratulated on lifestyle changes -BP mildly elevated, but cardiologist adjust medications and advised him to follow up with them as needed -Patient advised to return or notify a doctor immediately if symptoms worsen or persist or new concerns arise.  There are no Patient Instructions on file for this visit.   Colin Benton R.

## 2013-09-25 ENCOUNTER — Ambulatory Visit: Payer: BC Managed Care – PPO | Admitting: Family Medicine

## 2013-10-30 ENCOUNTER — Ambulatory Visit: Payer: BC Managed Care – PPO | Admitting: Family Medicine

## 2013-11-23 ENCOUNTER — Telehealth: Payer: Self-pay | Admitting: *Deleted

## 2013-11-23 NOTE — Telephone Encounter (Signed)
I called the pt and he stated he is out of town and will call back later for an appt and he did not know he had an appt until he received a message in his Mychart last night.

## 2013-11-23 NOTE — Telephone Encounter (Signed)
Message copied by Agnes Lawrence on Mon Nov 23, 2013 10:40 AM ------      Message from: Lucretia Kern      Created: Mon Nov 23, 2013  9:42 AM      Regarding: FW: FYI:  Late Cancellation Notice        Call to make sure he is feeling well, etc. When rescheduled.      ----- Message -----         From: Vaughan Browner         Sent: 11/23/2013   8:28 AM           To: Lucretia Kern, DO, Agnes Lawrence, CMA      Subject: Juluis Rainier:  Late Cancellation Notice                           FYI:  Patient call at 8:15 to cancel 10am appt scheduled for today.  Patient states he was not aware he had an appt until he reviewed his mychart account.            Thanks,      Derinda Late       ------

## 2013-11-30 ENCOUNTER — Ambulatory Visit: Payer: BC Managed Care – PPO | Admitting: Family Medicine

## 2014-04-23 ENCOUNTER — Encounter: Payer: Self-pay | Admitting: Family Medicine

## 2014-04-23 ENCOUNTER — Ambulatory Visit (INDEPENDENT_AMBULATORY_CARE_PROVIDER_SITE_OTHER): Payer: BC Managed Care – PPO | Admitting: Family Medicine

## 2014-04-23 VITALS — BP 138/86 | HR 86 | Temp 97.5°F | Ht 71.0 in | Wt 320.5 lb

## 2014-04-23 DIAGNOSIS — IMO0002 Reserved for concepts with insufficient information to code with codable children: Secondary | ICD-10-CM

## 2014-04-23 DIAGNOSIS — E1129 Type 2 diabetes mellitus with other diabetic kidney complication: Secondary | ICD-10-CM

## 2014-04-23 DIAGNOSIS — E785 Hyperlipidemia, unspecified: Secondary | ICD-10-CM

## 2014-04-23 DIAGNOSIS — E1165 Type 2 diabetes mellitus with hyperglycemia: Secondary | ICD-10-CM

## 2014-04-23 DIAGNOSIS — Z23 Encounter for immunization: Secondary | ICD-10-CM

## 2014-04-23 DIAGNOSIS — I10 Essential (primary) hypertension: Secondary | ICD-10-CM

## 2014-04-23 DIAGNOSIS — I509 Heart failure, unspecified: Secondary | ICD-10-CM

## 2014-04-23 DIAGNOSIS — N182 Chronic kidney disease, stage 2 (mild): Secondary | ICD-10-CM

## 2014-04-23 MED ORDER — LISINOPRIL 10 MG PO TABS: 20.0000 mg | ORAL_TABLET | Freq: Every day | ORAL | Status: DC

## 2014-04-23 MED ORDER — FUROSEMIDE 40 MG PO TABS: 20.0000 mg | ORAL_TABLET | Freq: Every day | ORAL | Status: DC

## 2014-04-23 MED ORDER — GLUCOSE BLOOD VI STRP: ORAL_STRIP | Status: DC

## 2014-04-23 MED ORDER — ONETOUCH DELICA LANCETS 33G MISC
1.0000 | Freq: Every day | Status: DC
Start: 2014-04-23 — End: 2015-10-01

## 2014-04-23 MED ORDER — METFORMIN HCL 1000 MG PO TABS: 1000.0000 mg | ORAL_TABLET | Freq: Two times a day (BID) | ORAL | Status: DC

## 2014-04-23 MED ORDER — CARVEDILOL 6.25 MG PO TABS: 6.2500 mg | ORAL_TABLET | Freq: Two times a day (BID) | ORAL | Status: DC

## 2014-04-23 NOTE — Progress Notes (Signed)
HPI:  DM, with CKD: -meds: asa, acei, metformin -last visit 05/2013 - was exercising and had lost weight and was able to stop the glipizide -reports: has not been exercising lately - is planning on restarting exercise -ran out of meds several weeks ago -has eye exam scheduled for tomorrow -denies: polyuria, polydipsia, vision changes, wounds on feet  HTN, CsdCHF, CM, pulm HTN: -sees a cardiologist, seeing cardiologist next week -meds: asa, acei, bb, norvasc, lasix - but ran out of all meds a month ago -denies: no CP, palpitations, swelling, sudden weight gain    ROS: See pertinent positives and negatives per HPI.  Past Medical History  Diagnosis Date  . Allergy   . Hypertension   . Obesity   . CHF (congestive heart failure)   . Chronic kidney disease   . Acute decompensated heart failure 01/03/2013  . CKD (chronic kidney disease) stage 2, GFR 60-89 ml/min 01/04/2013  . DM (diabetes mellitus) 01/06/2013  . Hypertensive emergency 01/03/2013  . Acute decompensated heart failure 01/03/2013    No past surgical history on file.  Family History  Problem Relation Age of Onset  . Heart disease Father 26    MI  . Hypertension Father   . Hyperlipidemia Father   . Diabetes Mother   . Hypertension Mother     History   Social History  . Marital Status: Single    Spouse Name: N/A    Number of Children: N/A  . Years of Education: N/A   Social History Main Topics  . Smoking status: Never Smoker   . Smokeless tobacco: None  . Alcohol Use: No  . Drug Use: No  . Sexual Activity: None   Other Topics Concern  . None   Social History Narrative   Work or School: Engineer, agricultural, Tax inspector      Home Situation: lives alone      Spiritual Beliefs: Christian      Lifestyle: 15 minutes dialy walking - working up; working on diet             Current outpatient prescriptions: aspirin EC 81 MG EC tablet, Take 1 tablet (81 mg total) by mouth daily., Disp:  , Rfl: ;  Blood Glucose Monitoring Suppl (ACURA BLOOD GLUCOSE METER) W/DEVICE KIT, 1 meter with lancets and test strips for daily BS testing QS 1 month, Disp: 1 kit, Rfl: 0;  carvedilol (COREG) 6.25 MG tablet, Take 1 tablet (6.25 mg total) by mouth 2 (two) times daily with a meal., Disp: 60 tablet, Rfl: 11 furosemide (LASIX) 40 MG tablet, Take 0.5 tablets (20 mg total) by mouth daily., Disp: 60 tablet, Rfl: 11;  glucose blood (ONE TOUCH ULTRA TEST) test strip, Use as directed to check blood sugar once a day., Disp: 100 each, Rfl: 3;  ONETOUCH DELICA LANCETS 34H MISC, 1 Device by Other route daily. Use as directed for glucose testing once a day., Disp: 100 each, Rfl: 3 potassium chloride (K-DUR) 10 MEQ tablet, Take 4 tablets (40 mEq total) by mouth daily., Disp: 30 tablet, Rfl: 0;  lisinopril (PRINIVIL,ZESTRIL) 10 MG tablet, Take 2 tablets (20 mg total) by mouth daily., Disp: 90 tablet, Rfl: 3;  metFORMIN (GLUCOPHAGE) 1000 MG tablet, Take 1 tablet (1,000 mg total) by mouth 2 (two) times daily with a meal., Disp: 180 tablet, Rfl: 3  EXAM:  Filed Vitals:   04/23/14 1309  BP: 138/86  Pulse: 86  Temp: 97.5 F (36.4 C)    Body mass index  is 44.72 kg/(m^2).  GENERAL: vitals reviewed and listed above, alert, oriented, appears well hydrated and in no acute distress  HEENT: atraumatic, conjunttiva clear, no obvious abnormalities on inspection of external nose and ears  NECK: no obvious masses on inspection  LUNGS: clear to auscultation bilaterally, no wheezes, rales or rhonchi, good air movement  CV: HRRR, tr peripheral pitting edema  MS: moves all extremities without noticeable abnormality  PSYCH: pleasant and cooperative, no obvious depression or anxiety  FOOT exam: see chart  ASSESSMENT AND PLAN:  Discussed the following assessment and plan:  Type 2 diabetes, uncontrolled, with renal manifestation - Plan: Hemoglobin A1c, lisinopril (PRINIVIL,ZESTRIL) 10 MG tablet, metFORMIN (GLUCOPHAGE)  1000 MG tablet -stressed importance of return to regular CV exercise a long with a healthy diet -refilled medications and he will schedule lab appt -add statin if LDL > 100 -he reports he is having eye exam   Essential hypertension, benign - Plan: Basic metabolic panel, carvedilol (COREG) 6.25 MG tablet, lisinopril (PRINIVIL,ZESTRIL) 10 MG tablet -decrease regimen given BP not very high today despite being off all meds for 1 month - has follow up next week with his cardiologist per his report  CKD (chronic kidney disease) stage 2, GFR 60-89 ml/min  Hyperlipemia - Plan: Lipid Panel -start statin if> 100  Chronic congestive heart failure, unspecified congestive heart failure type - Plan: furosemide (LASIX) 40 MG tablet -some LE edema, restart lower dose of lasix, has follow up with his cardioloigist next week  -Patient advised to return or notify a doctor immediately if symptoms worsen or persist or new concerns arise.  Patient Instructions  BEFORE YOU LEAVE: -flu shot -schedule fasting lab appointment -schedule 4 month follow up with me  We recommend the following healthy lifestyle measures: - eat a healthy diet consisting of lots of vegetables, fruits, beans, nuts, seeds, healthy meats such as white chicken and fish and whole grains.  - avoid fried foods, fast food, processed foods, sodas, red meet and other fattening foods.  - get a least 150 minutes of aerobic exercise per week.          Colin Benton R.

## 2014-04-23 NOTE — Progress Notes (Signed)
Pre visit review using our clinic review tool, if applicable. No additional management support is needed unless otherwise documented below in the visit note. 

## 2014-04-23 NOTE — Patient Instructions (Signed)
BEFORE YOU LEAVE: -flu shot -schedule fasting lab appointment -schedule 4 month follow up with me  We recommend the following healthy lifestyle measures: - eat a healthy diet consisting of lots of vegetables, fruits, beans, nuts, seeds, healthy meats such as white chicken and fish and whole grains.  - avoid fried foods, fast food, processed foods, sodas, red meet and other fattening foods.  - get a least 150 minutes of aerobic exercise per week.

## 2014-04-26 ENCOUNTER — Telehealth: Payer: Self-pay | Admitting: Family Medicine

## 2014-04-26 MED ORDER — AMLODIPINE BESYLATE 5 MG PO TABS: ORAL_TABLET | ORAL | Status: DC

## 2014-04-26 NOTE — Telephone Encounter (Signed)
Rx done. 

## 2014-04-26 NOTE — Telephone Encounter (Signed)
Pt needs refill on amlodipine ?mg  #30 W/refills call into [facility]

## 2014-04-26 NOTE — Telephone Encounter (Signed)
emmi emailed °

## 2014-05-07 ENCOUNTER — Encounter: Payer: Self-pay | Admitting: Family Medicine

## 2014-05-07 ENCOUNTER — Other Ambulatory Visit (INDEPENDENT_AMBULATORY_CARE_PROVIDER_SITE_OTHER): Payer: BC Managed Care – PPO

## 2014-05-07 DIAGNOSIS — E1165 Type 2 diabetes mellitus with hyperglycemia: Secondary | ICD-10-CM

## 2014-05-07 DIAGNOSIS — E1129 Type 2 diabetes mellitus with other diabetic kidney complication: Secondary | ICD-10-CM

## 2014-05-07 DIAGNOSIS — I10 Essential (primary) hypertension: Secondary | ICD-10-CM

## 2014-05-07 DIAGNOSIS — E785 Hyperlipidemia, unspecified: Secondary | ICD-10-CM

## 2014-05-07 DIAGNOSIS — IMO0002 Reserved for concepts with insufficient information to code with codable children: Secondary | ICD-10-CM

## 2014-05-09 LAB — BASIC METABOLIC PANEL
BUN: 19 mg/dL (ref 6–23)
CO2: 27 mEq/L (ref 19–32)
CREATININE: 1.4 mg/dL (ref 0.4–1.5)
Calcium: 8.9 mg/dL (ref 8.4–10.5)
Chloride: 100 mEq/L (ref 96–112)
GFR: 73.21 mL/min (ref >60.00)
Glucose, Bld: 106 mg/dL — ABNORMAL HIGH (ref 70–99)
Potassium: 3.7 mEq/L (ref 3.5–5.1)
SODIUM: 138 meq/L (ref 135–145)

## 2014-05-09 LAB — LIPID PANEL
CHOL/HDL RATIO: 5
Cholesterol: 200 mg/dL (ref 0–200)
HDL: 40.3 mg/dL (ref >39.00)
LDL Cholesterol: 140 mg/dL — ABNORMAL HIGH (ref 0–99)
NONHDL: 159.7
Triglycerides: 101 mg/dL (ref 0.0–149.0)
VLDL: 20.2 mg/dL (ref 0.0–40.0)

## 2014-05-09 LAB — HEMOGLOBIN A1C: Hgb A1c MFr Bld: 6.9 % — ABNORMAL HIGH (ref 4.6–6.5)

## 2014-05-10 ENCOUNTER — Telehealth: Payer: Self-pay | Admitting: Family Medicine

## 2014-05-10 MED ORDER — PRAVASTATIN SODIUM 40 MG PO TABS: 40.0000 mg | ORAL_TABLET | Freq: Every day | ORAL | Status: DC

## 2014-05-10 NOTE — Addendum Note (Signed)
Addended by: Agnes Lawrence on: 05/10/2014 03:46 PM   Modules accepted: Orders

## 2014-05-10 NOTE — Telephone Encounter (Signed)
emmi emailed °

## 2014-05-13 ENCOUNTER — Encounter (HOSPITAL_COMMUNITY): Payer: Self-pay | Admitting: Cardiovascular Disease

## 2014-05-31 ENCOUNTER — Telehealth: Payer: Self-pay | Admitting: Family Medicine

## 2014-05-31 MED ORDER — AMLODIPINE BESYLATE 5 MG PO TABS: 5.0000 mg | ORAL_TABLET | Freq: Every day | ORAL | Status: DC

## 2014-05-31 NOTE — Telephone Encounter (Signed)
Rx done and I called the pt and he is aware of this and the Rx was sent to his pharmacy.

## 2014-05-31 NOTE — Telephone Encounter (Signed)
I think this may have been the way his cardiologist prescribed it?  I am ok with him taking norvasc 5 mg once daily. Please send new rx #90, 3 refills, Needs appt sometime in next 1-2 months to check BP

## 2014-05-31 NOTE — Telephone Encounter (Signed)
Per Malachy Mood the medication should be Amlodipine.

## 2014-05-31 NOTE — Telephone Encounter (Signed)
Pt called to say that the pharmacy told him that if he was given a higher dose it would be cheaper for him.  His insurance will pay for once a day but not twice a day .Marland Kitchen              Pharmacy; [facility] on [address]

## 2014-07-09 ENCOUNTER — Ambulatory Visit: Payer: BC Managed Care – PPO | Admitting: Family Medicine

## 2014-08-24 ENCOUNTER — Ambulatory Visit: Payer: BC Managed Care – PPO | Admitting: Family Medicine

## 2014-10-01 ENCOUNTER — Ambulatory Visit: Payer: Self-pay | Admitting: Family Medicine

## 2014-10-25 IMAGING — CR DG CHEST 2V
2 series · 2 of 2 positions shown · non-contrast
Comparison: None.

CLINICAL DATA: Shortness of breath.  Bilateral lower extremity
edema.

CHEST - 2 VIEW

[PA]
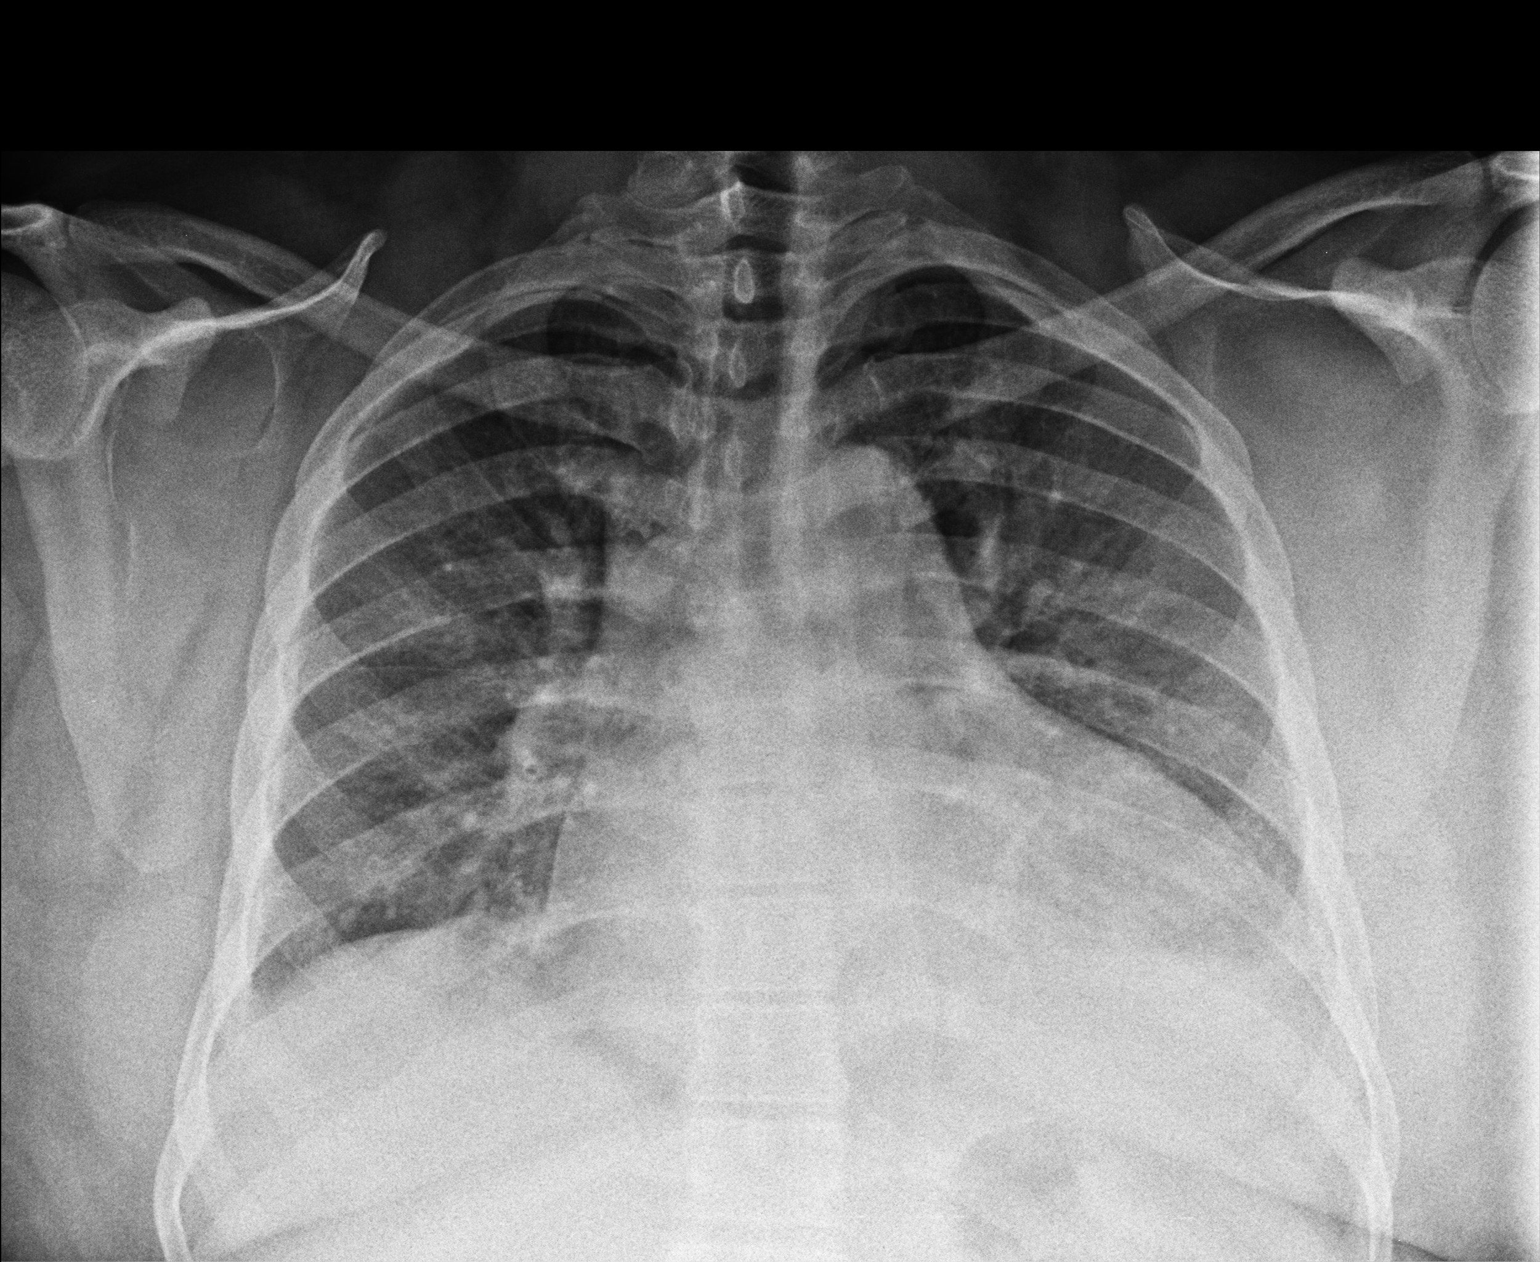

[lateral]
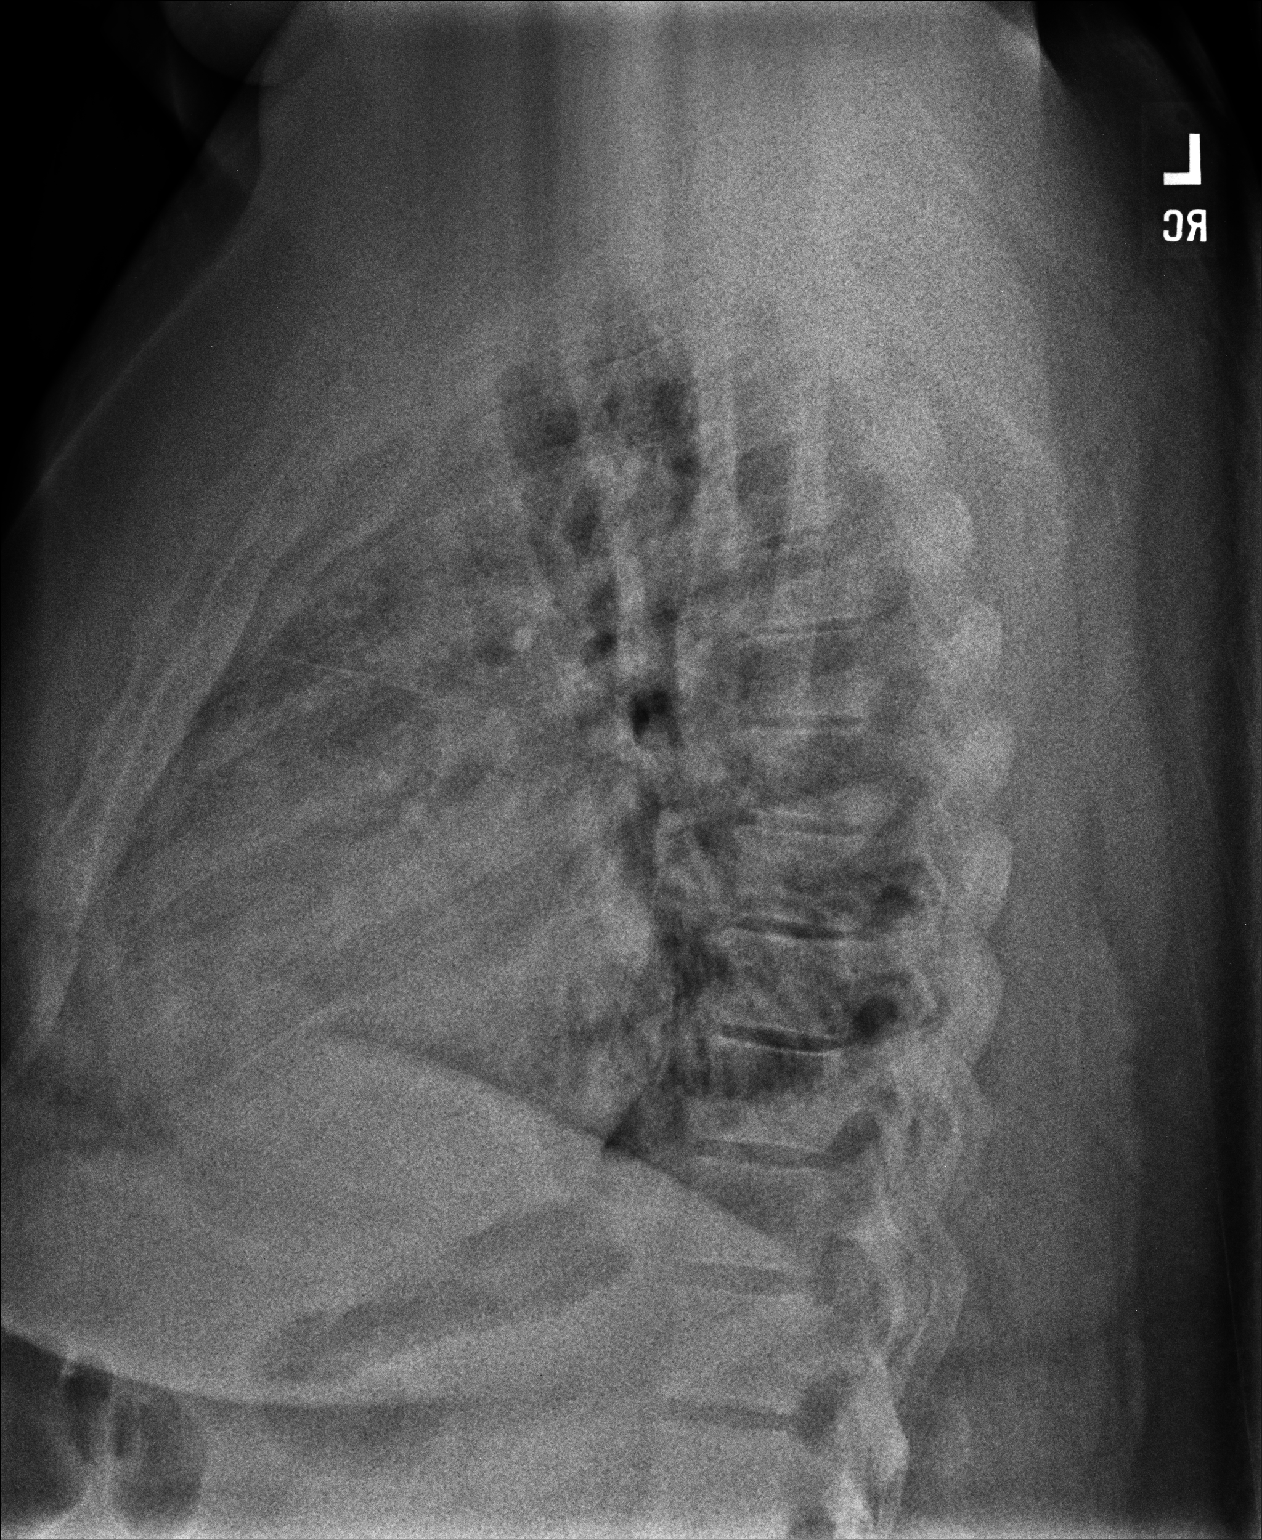

[2 of 2 positions shown; findings below may reference images not displayed]

FINDINGS: Cardiac silhouette markedly enlarged.  Pulmonary venous
hypertension and perhaps minimal interstitial pulmonary edema.
Mild atelectasis in the left lower lobe.  Probable small bilateral
pleural effusions.  Lateral image suboptimal due to
underpenetration.  Mild degenerative changes involving the thoracic
spine.
IMPRESSION: Marked cardiomegaly.  Pulmonary venous hypertension with perhaps
minimal interstitial pulmonary edema.  Small bilateral effusions.
Mild atelectasis in the left lower lobe.

Clinically significant discrepancy from primary report, if
provided: None

## 2014-10-26 IMAGING — US US RENAL
1 series · 14 of 25 positions shown · non-contrast
Comparison: None.

CLINICAL DATA: Acute renal injury

RENAL/URINARY TRACT ULTRASOUND COMPLETE

[Series 1: us renal · 0.35mm/px · 14 of 38 slices shown]
[im 1/38]
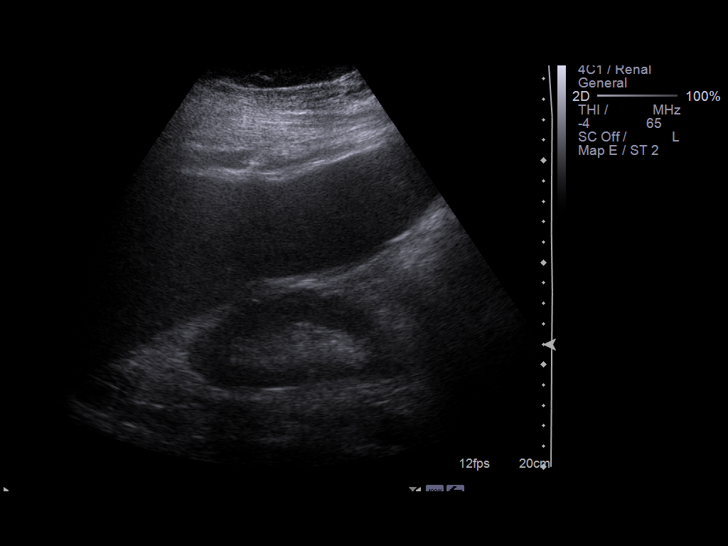
[im 4/38]
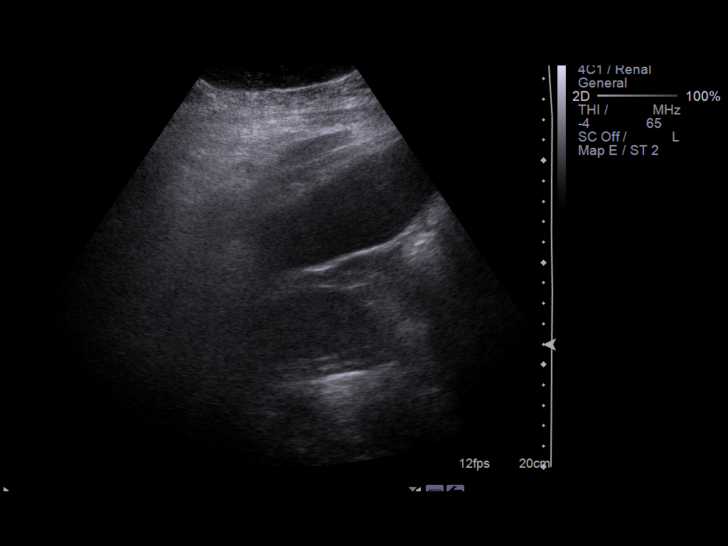
[im 7/38]
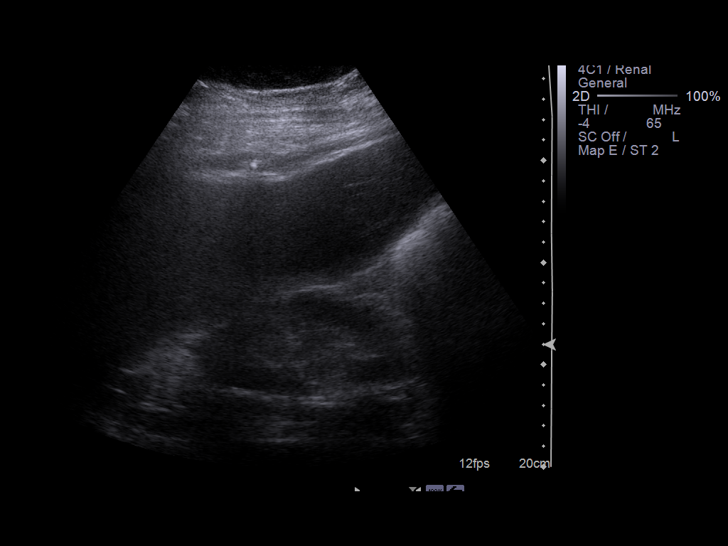
[im 10/38]
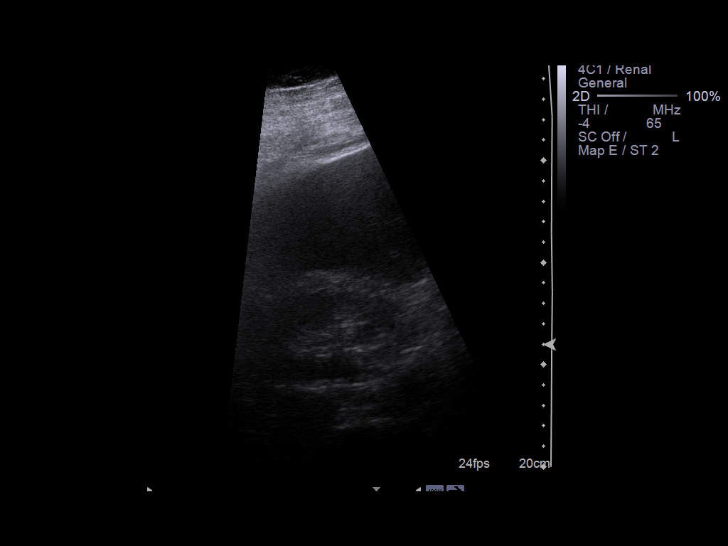
[im 13/38]
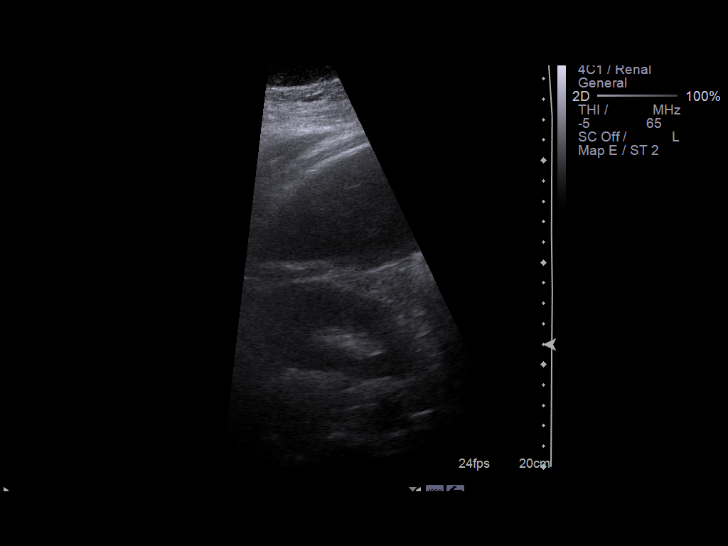
[im 14/38]
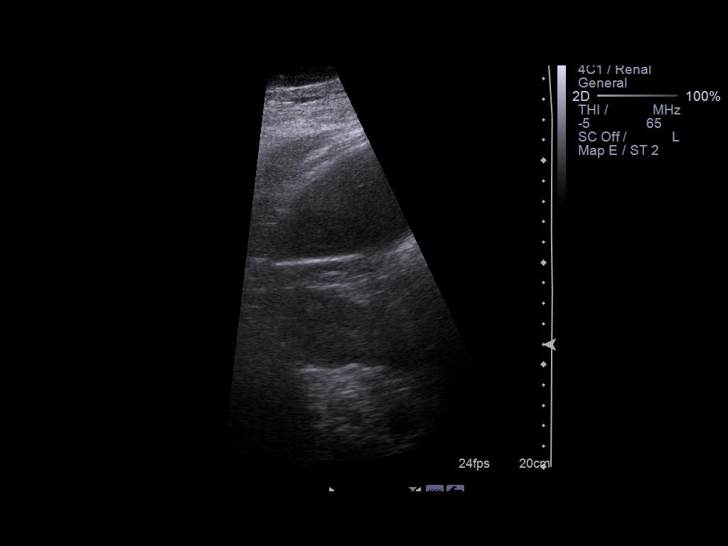
[im 17/38]
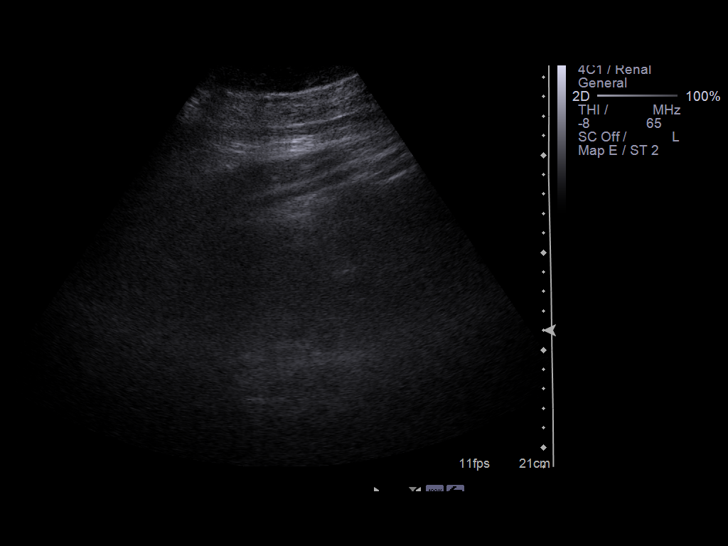
[im 21/38]
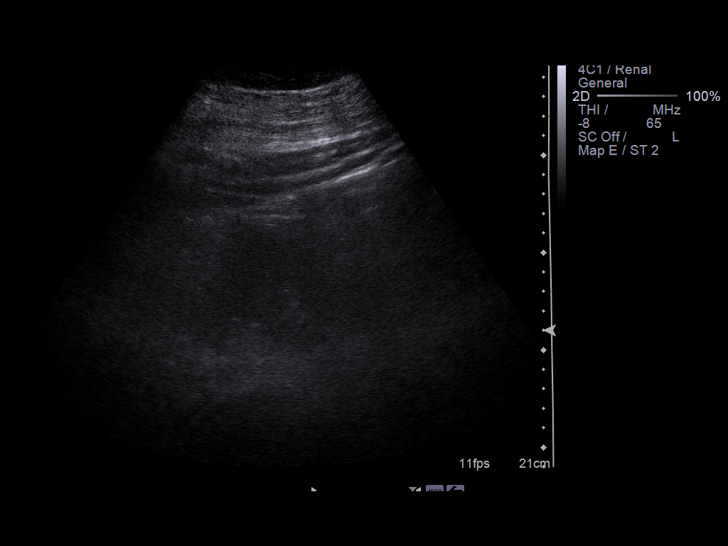
[im 24/38]
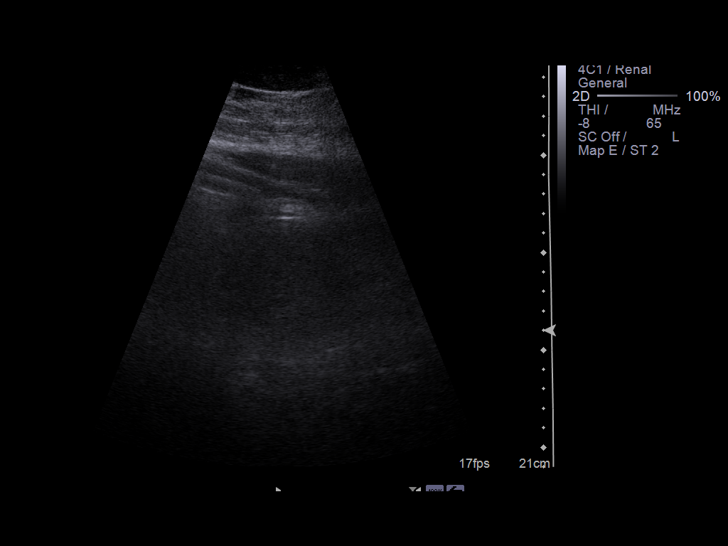
[im 25/38]
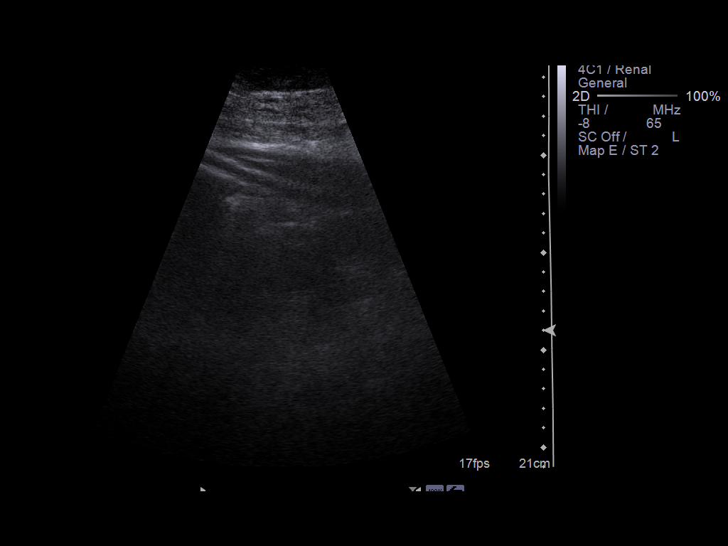
[im 28/38]
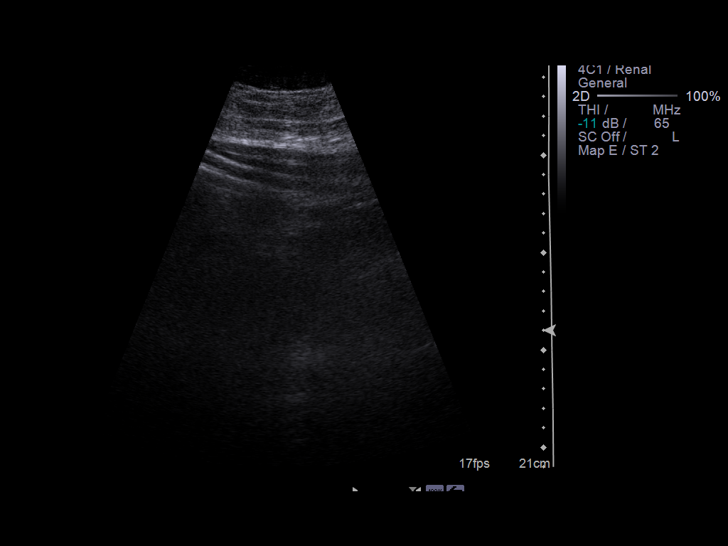
[im 31/38]
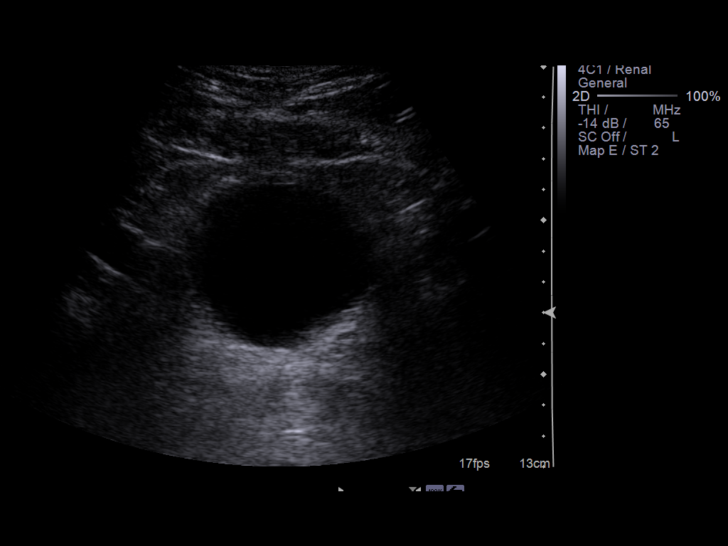
[im 34/38]
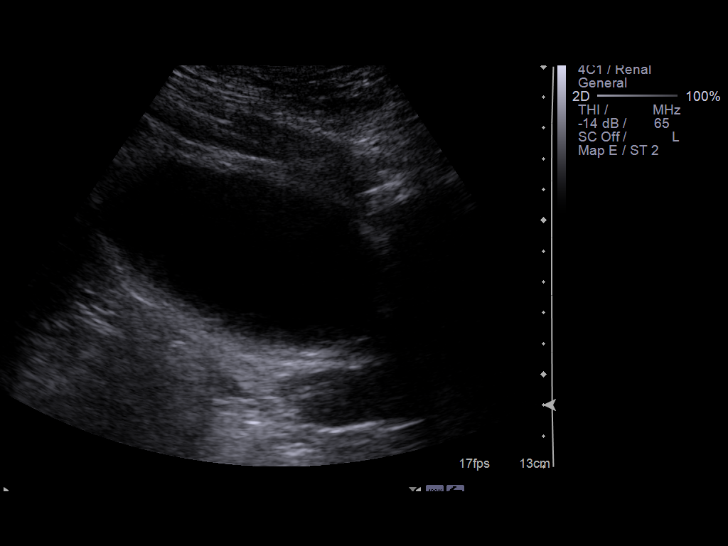
[im 38/38]
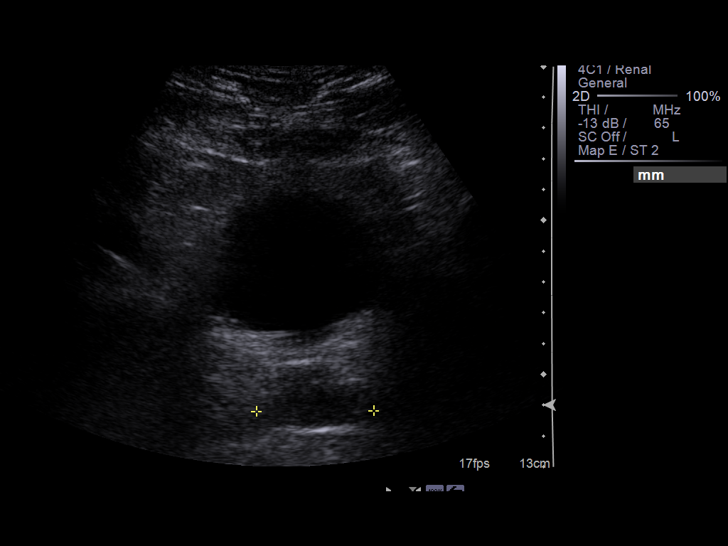

[14 of 25 positions shown; findings below may reference images not displayed]

FINDINGS: Right Kidney:  10.2 cm in length.  No mass lesion or hydronephrosis
is noted.

Left Kidney:  10.8 cm in greatest length.  No mass lesion or
hydronephrosis is noted.  Evaluation of the left kidney is somewhat
limited secondary to patient's body habitus.

Bladder:  Well distended.
IMPRESSION: Somewhat limited exam although no gross abnormality is noted.

## 2015-09-01 ENCOUNTER — Ambulatory Visit: Payer: Self-pay | Admitting: Family Medicine

## 2015-09-26 ENCOUNTER — Emergency Department (HOSPITAL_COMMUNITY): Payer: BLUE CROSS/BLUE SHIELD

## 2015-09-26 ENCOUNTER — Inpatient Hospital Stay (HOSPITAL_COMMUNITY)
Admission: EM | Admit: 2015-09-26 | Discharge: 2015-10-01 | DRG: 291 | Disposition: A | Discharge status: Home / Self Care | Payer: BLUE CROSS/BLUE SHIELD | Attending: Cardiology | Admitting: Cardiology

## 2015-09-26 ENCOUNTER — Encounter (HOSPITAL_COMMUNITY): Payer: Self-pay | Admitting: Emergency Medicine

## 2015-09-26 DIAGNOSIS — I509 Heart failure, unspecified: Secondary | ICD-10-CM

## 2015-09-26 DIAGNOSIS — I5033 Acute on chronic diastolic (congestive) heart failure: Secondary | ICD-10-CM | POA: Insufficient documentation

## 2015-09-26 DIAGNOSIS — E1129 Type 2 diabetes mellitus with other diabetic kidney complication: Secondary | ICD-10-CM

## 2015-09-26 DIAGNOSIS — E1122 Type 2 diabetes mellitus with diabetic chronic kidney disease: Secondary | ICD-10-CM | POA: Diagnosis present

## 2015-09-26 DIAGNOSIS — R600 Localized edema: Secondary | ICD-10-CM | POA: Diagnosis present

## 2015-09-26 DIAGNOSIS — I5023 Acute on chronic systolic (congestive) heart failure: Secondary | ICD-10-CM | POA: Diagnosis present

## 2015-09-26 DIAGNOSIS — E118 Type 2 diabetes mellitus with unspecified complications: Secondary | ICD-10-CM | POA: Diagnosis present

## 2015-09-26 DIAGNOSIS — E669 Obesity, unspecified: Secondary | ICD-10-CM | POA: Diagnosis present

## 2015-09-26 DIAGNOSIS — Z9114 Patient's other noncompliance with medication regimen: Secondary | ICD-10-CM

## 2015-09-26 DIAGNOSIS — E876 Hypokalemia: Secondary | ICD-10-CM | POA: Diagnosis present

## 2015-09-26 DIAGNOSIS — I130 Hypertensive heart and chronic kidney disease with heart failure and stage 1 through stage 4 chronic kidney disease, or unspecified chronic kidney disease: Secondary | ICD-10-CM | POA: Diagnosis not present

## 2015-09-26 DIAGNOSIS — R7989 Other specified abnormal findings of blood chemistry: Secondary | ICD-10-CM | POA: Diagnosis not present

## 2015-09-26 DIAGNOSIS — I10 Essential (primary) hypertension: Secondary | ICD-10-CM | POA: Diagnosis not present

## 2015-09-26 DIAGNOSIS — Z6841 Body Mass Index (BMI) 40.0 and over, adult: Secondary | ICD-10-CM

## 2015-09-26 DIAGNOSIS — N179 Acute kidney failure, unspecified: Secondary | ICD-10-CM | POA: Diagnosis present

## 2015-09-26 DIAGNOSIS — R778 Other specified abnormalities of plasma proteins: Secondary | ICD-10-CM | POA: Diagnosis present

## 2015-09-26 DIAGNOSIS — I5189 Other ill-defined heart diseases: Secondary | ICD-10-CM | POA: Diagnosis present

## 2015-09-26 DIAGNOSIS — Z7984 Long term (current) use of oral hypoglycemic drugs: Secondary | ICD-10-CM

## 2015-09-26 DIAGNOSIS — R0602 Shortness of breath: Secondary | ICD-10-CM | POA: Diagnosis not present

## 2015-09-26 DIAGNOSIS — E1169 Type 2 diabetes mellitus with other specified complication: Secondary | ICD-10-CM | POA: Diagnosis present

## 2015-09-26 DIAGNOSIS — N182 Chronic kidney disease, stage 2 (mild): Secondary | ICD-10-CM | POA: Diagnosis present

## 2015-09-26 DIAGNOSIS — I429 Cardiomyopathy, unspecified: Secondary | ICD-10-CM | POA: Diagnosis present

## 2015-09-26 DIAGNOSIS — M7989 Other specified soft tissue disorders: Secondary | ICD-10-CM

## 2015-09-26 DIAGNOSIS — Z7982 Long term (current) use of aspirin: Secondary | ICD-10-CM

## 2015-09-26 DIAGNOSIS — IMO0002 Reserved for concepts with insufficient information to code with codable children: Secondary | ICD-10-CM

## 2015-09-26 DIAGNOSIS — I248 Other forms of acute ischemic heart disease: Secondary | ICD-10-CM | POA: Diagnosis present

## 2015-09-26 DIAGNOSIS — E1165 Type 2 diabetes mellitus with hyperglycemia: Secondary | ICD-10-CM | POA: Diagnosis present

## 2015-09-26 DIAGNOSIS — Z79899 Other long term (current) drug therapy: Secondary | ICD-10-CM

## 2015-09-26 LAB — CBC
HCT: 43.9 % (ref 39.0–52.0)
Hemoglobin: 13.7 g/dL (ref 13.0–17.0)
MCH: 27 pg (ref 26.0–34.0)
MCHC: 31.2 g/dL (ref 30.0–36.0)
MCV: 86.6 fL (ref 78.0–100.0)
Platelets: 209 10*3/uL (ref 150–400)
RBC: 5.07 MIL/uL (ref 4.22–5.81)
RDW: 14.7 % (ref 11.5–15.5)
WBC: 7.9 10*3/uL (ref 4.0–10.5)

## 2015-09-26 LAB — LIPID PANEL
Cholesterol: 139 mg/dL (ref 0–200)
HDL: 32 mg/dL — ABNORMAL LOW (ref >40)
LDL CALC: 88 mg/dL (ref 0–99)
Total CHOL/HDL Ratio: 4.3 RATIO
Triglycerides: 96 mg/dL (ref <150)
VLDL: 19 mg/dL (ref 0–40)

## 2015-09-26 LAB — BASIC METABOLIC PANEL
Anion gap: 14 (ref 5–15)
BUN: 15 mg/dL (ref 6–20)
CO2: 23 mmol/L (ref 22–32)
Calcium: 8.6 mg/dL — ABNORMAL LOW (ref 8.9–10.3)
Chloride: 104 mmol/L (ref 101–111)
Creatinine, Ser: 1.44 mg/dL — ABNORMAL HIGH (ref 0.61–1.24)
GFR calc Af Amer: >60 mL/min (ref >60)
GFR calc non Af Amer: 57 mL/min — ABNORMAL LOW (ref >60)
Glucose, Bld: 211 mg/dL — ABNORMAL HIGH (ref 65–99)
Potassium: 3.3 mmol/L — ABNORMAL LOW (ref 3.5–5.1)
Sodium: 141 mmol/L (ref 135–145)

## 2015-09-26 LAB — TROPONIN I
TROPONIN I: 0.15 ng/mL — AB (ref <0.031)
TROPONIN I: 0.13 ng/mL — AB (ref <0.031)

## 2015-09-26 LAB — GLUCOSE, CAPILLARY
Glucose-Capillary: 157 mg/dL — ABNORMAL HIGH (ref 65–99)
Glucose-Capillary: 146 mg/dL — ABNORMAL HIGH (ref 65–99)

## 2015-09-26 LAB — I-STAT TROPONIN, ED: Troponin i, poc: 0.09 ng/mL (ref 0.00–0.08)

## 2015-09-26 LAB — BRAIN NATRIURETIC PEPTIDE: B Natriuretic Peptide: 590.2 pg/mL — ABNORMAL HIGH (ref 0.0–100.0)

## 2015-09-26 LAB — D-DIMER, QUANTITATIVE (NOT AT ARMC): D DIMER QUANT: 1.02 ug{FEU}/mL — AB (ref 0.00–0.50)

## 2015-09-26 LAB — MAGNESIUM: MAGNESIUM: 1.7 mg/dL (ref 1.7–2.4)

## 2015-09-26 MED ORDER — PRAVASTATIN SODIUM 40 MG PO TABS: 40.0000 mg | ORAL_TABLET | Freq: Every day | ORAL | Status: DC

## 2015-09-26 MED ORDER — POTASSIUM CHLORIDE CRYS ER 20 MEQ PO TBCR
40.0000 meq | EXTENDED_RELEASE_TABLET | Freq: Two times a day (BID) | ORAL | Status: AC
  Administered 2015-09-26 (×2): 40 meq via ORAL
  Filled 2015-09-26 (×2): qty 2

## 2015-09-26 MED ORDER — AMLODIPINE BESYLATE 5 MG PO TABS
5.0000 mg | ORAL_TABLET | Freq: Every day | ORAL | Status: DC
  Administered 2015-09-26 – 2015-09-28 (×3): 5 mg via ORAL
  Filled 2015-09-26 (×3): qty 1

## 2015-09-26 MED ORDER — INSULIN ASPART 100 UNIT/ML ~~LOC~~ SOLN: 0.0000 [IU] | Freq: Every day | SUBCUTANEOUS | Status: DC

## 2015-09-26 MED ORDER — IOPAMIDOL (ISOVUE-370) INJECTION 76%
INTRAVENOUS | Status: AC
  Administered 2015-09-26: 100 mL via INTRAVENOUS
  Filled 2015-09-26: qty 100

## 2015-09-26 MED ORDER — NITROGLYCERIN 0.4 MG SL SUBL
0.4000 mg | SUBLINGUAL_TABLET | SUBLINGUAL | Status: DC | PRN
  Administered 2015-09-26: 0.4 mg via SUBLINGUAL
  Filled 2015-09-26: qty 1

## 2015-09-26 MED ORDER — ASPIRIN 81 MG PO CHEW
324.0000 mg | CHEWABLE_TABLET | Freq: Once | ORAL | Status: AC
  Administered 2015-09-26: 324 mg via ORAL
  Filled 2015-09-26: qty 4

## 2015-09-26 MED ORDER — LISINOPRIL 10 MG PO TABS
10.0000 mg | ORAL_TABLET | Freq: Every day | ORAL | Status: DC
  Administered 2015-09-26 – 2015-09-28 (×3): 10 mg via ORAL
  Filled 2015-09-26 (×3): qty 1

## 2015-09-26 MED ORDER — CARVEDILOL 6.25 MG PO TABS
6.2500 mg | ORAL_TABLET | Freq: Two times a day (BID) | ORAL | Status: DC
  Administered 2015-09-26 – 2015-10-01 (×10): 6.25 mg via ORAL
  Filled 2015-09-26 (×10): qty 1

## 2015-09-26 MED ORDER — FUROSEMIDE 10 MG/ML IJ SOLN
40.0000 mg | Freq: Two times a day (BID) | INTRAMUSCULAR | Status: DC
  Administered 2015-09-26 – 2015-09-28 (×5): 40 mg via INTRAVENOUS
  Filled 2015-09-26 (×5): qty 4

## 2015-09-26 MED ORDER — INSULIN ASPART 100 UNIT/ML ~~LOC~~ SOLN
0.0000 [IU] | Freq: Three times a day (TID) | SUBCUTANEOUS | Status: DC
  Administered 2015-09-26: 3 [IU] via SUBCUTANEOUS

## 2015-09-26 NOTE — ED Notes (Addendum)
Pt c/o SOB onset this morning, also has noted some upper thigh area swelling x several weeks. Pt c/o nonproductive cough. Denies fever. Pt with hx of CHF and states he takes a fluid pill.

## 2015-09-26 NOTE — H&P (Signed)
[facility] History and Physical  Richard [name] GUY:[id number] DOB: 1970-08-04 DOA: 09/26/2015  Referring physician: browning PCP: Richard [name]., DO   Chief Complaint: dyspnea/bilateral LE edema  HPI: Richard [name] is a very pleasant 45 y.o. male with a past medical history that includes diabetes, diastolic heart failure, hypertension, chronic kidney disease stage II presents to emergency Department chief complaint sudden onset shortness of breath persistent lower extremity edema. Initial evaluation concerning for acute on chronic diastolic heart failure, elevated troponin, acute on chronic renal failure.  Information is obtained from the patient. He reports noncompliance with his home medications particularly antihypertensives and Lasix. he reports intermittent bilateral lower extremity edema over the last several weeks. He he states as a result he has been taken his Lasix every day. His other medications he takes "every now and then to make him last longer". This morning he awakened with sudden worsening shortness of breath with exertion. He reports becoming very short of breath while taking a shower this morning. Denies chest pain palpitations, dizziness syncope or near-syncope. Associated symptoms do include worsening lower extremity edema with the right being worse than the left. He denies any orthopnea. He denies recent travel. His abdominal pain nausea vomiting diarrhea. He denies dysuria hematuria frequency or urgency.  Emergency department is afebrile hemodynamically stable and not hypoxic.  Review of Systems:  View of systems completed all systems are negative except as indicated in the history of present illness  Past Medical History  Diagnosis Date  . Allergy   . Hypertension   . Obesity   . CHF (congestive heart failure) ([place])   . Chronic kidney disease   . Acute decompensated heart failure ([place]) 01/03/2013  . CKD (chronic kidney disease) stage 2, GFR 60-89 ml/min  01/04/2013  . DM (diabetes mellitus) ([place]) 01/06/2013  . Hypertensive emergency 01/03/2013  . Acute decompensated heart failure ([place]) 01/03/2013   Past Surgical History  Procedure Laterality Date  . Left and right heart catheterization with coronary angiogram N/A 01/07/2013    Procedure: LEFT AND RIGHT HEART CATHETERIZATION WITH CORONARY ANGIOGRAM;  Surgeon: Richard Riddle, MD;  Location: [facility];  Service: Cardiovascular;  Laterality: N/A;   Social History:  reports that he has never smoked. He does not have any smokeless tobacco history on file. He reports that he does not drink alcohol or use illicit drugs. He lives at home he is employed at [employer] he is independent with ADLs. Is noncompliant with outpatient follow-up and management of chronic illnesses No Known Allergies  Family History  Problem Relation Age of Onset  . Heart disease Father 34    MI  . Hypertension Father   . Hyperlipidemia Father   . Diabetes Mother   . Hypertension Mother      Prior to Admission medications   Medication Sig Start Date End Date Taking? Authorizing Provider  amLODipine (NORVASC) 5 MG tablet Take 1 tablet (5 mg total) by mouth daily. 05/31/14  Yes Richard Kern, DO  Blood Glucose Monitoring Suppl [facility] BLOOD GLUCOSE METER) W/DEVICE KIT 1 meter with lancets and test strips for daily BS testing QS 1 month 01/10/13  Yes Richard Girt, DO  furosemide (LASIX) 40 MG tablet Take 0.5 tablets (20 mg total) by mouth daily. 04/23/14  Yes Richard Kern, DO  magnesium 30 MG tablet Take 30 mg by mouth 2 (two) times daily.   Yes Historical Provider, MD  metFORMIN (GLUCOPHAGE) 1000 MG tablet Take 1 tablet (1,000 mg  total) by mouth 2 (two) times daily with a meal. 04/23/14  Yes Richard Kern, DO  Multiple Vitamin (MULTIVITAMIN WITH MINERALS) TABS tablet Take 1 tablet by mouth daily.   Yes Historical Provider, MD  Omega-3 Fatty Acids (FISH OIL) 1000 MG CAPS Take 1 capsule by mouth daily.   Yes Historical Provider,  MD  [facility] DELICA LANCETS 63O MISC 1 Device by Other route daily. Use as directed for glucose testing once a day. 04/23/14  Yes Richard Kern, DO  potassium chloride (K-DUR) 10 MEQ tablet Take 4 tablets (40 mEq total) by mouth daily. Patient taking differently: Take 10 mEq by mouth daily.  01/10/13  Yes Richard Girt, DO  aspirin EC 81 MG EC tablet Take 1 tablet (81 mg total) by mouth daily. Patient not taking: Reported on 09/26/2015 01/10/13   Richard Girt, DO  carvedilol (COREG) 6.25 MG tablet Take 1 tablet (6.25 mg total) by mouth 2 (two) times daily with a meal. Patient not taking: Reported on 09/26/2015 04/23/14   Richard Kern, DO  glucose blood (ONE TOUCH ULTRA TEST) test strip Use as directed to check blood sugar once a day. Patient not taking: Reported on 09/26/2015 04/23/14   Richard Kern, DO  lisinopril (PRINIVIL,ZESTRIL) 10 MG tablet Take 2 tablets (20 mg total) by mouth daily. Patient not taking: Reported on 09/26/2015 04/23/14   Richard Kern, DO  pravastatin (PRAVACHOL) 40 MG tablet Take 1 tablet (40 mg total) by mouth daily. Patient not taking: Reported on 09/26/2015 05/10/14   Richard Kern, DO   Physical Exam: Filed Vitals:   09/26/15 0930 09/26/15 1015 09/26/15 1045 09/26/15 1100  BP:  149/93 158/96 122/96  Pulse:  92 85 84  Temp:      TempSrc:      Resp:  _0 Height: _1  (1.803 m)     Weight: 155.901 kg (343 lb 11.2 oz)     SpO2:  96% 97% 96%    Wt Readings from Last 3 Encounters:  09/26/15 155.901 kg (343 lb 11.2 oz)  04/23/14 145.378 kg (320 lb 8 oz)  05/22/13 130.182 kg (287 lb)    General:  Appears calm and comfortable, obese Eyes: PERRL, normal lids, irises & conjunctiva ENT: grossly normal hearing, lips & tongue, mucous membranes of his mouth are moist and pink Neck: no LAD, masses or thyromegaly Cardiovascular: RRR, no m/r/g. 1-2+ lower extremity edema on the right trace to 1+ lower extremity edema on the left. Extremities very tight up to  knees  Respiratory: Normal respiratory effort. Sounds are quite diminished throughout. I hear no crackles no wheezes no rhonchi Abdomen: soft, ntnd, these positive bowel sounds no guarding or rebounding Skin: no rash or induration seen on limited exam Musculoskeletal: grossly normal tone BUE/BLE Psychiatric: grossly normal mood and affect, speech fluent and appropriate Neurologic: grossly non-focal. Alert and oriented 3 speech clear           Labs on Admission:  Basic Metabolic Panel:  Recent Labs Lab 09/26/15 0941  NA 141  K 3.3*  CL 104  CO2 23  GLUCOSE 211*  BUN 15  CREATININE 1.44*  CALCIUM 8.6*   Liver Function Tests: No results for input(s): AST, ALT, ALKPHOS, BILITOT, PROT, ALBUMIN in the last 168 hours. No results for input(s): LIPASE, AMYLASE in the last 168 hours. No results for input(s): AMMONIA in the last 168 hours. CBC:  Recent Labs Lab 09/26/15 0941  WBC 7.9  HGB 13.7  HCT 43.9  MCV 86.6  PLT 209   Cardiac Enzymes: No results for input(s): CKTOTAL, CKMB, CKMBINDEX, TROPONINI in the last 168 hours.  BNP (last 3 results)  Recent Labs  09/26/15 0941  BNP 590.2*    ProBNP (last 3 results) No results for input(s): PROBNP in the last 8760 hours.  CBG: No results for input(s): GLUCAP in the last 168 hours.  Radiological Exams on Admission: Ct Angio Chest Pe W/cm &/or Wo Cm  09/26/2015  CLINICAL DATA:  Shortness of Breath EXAM: CT ANGIOGRAPHY CHEST WITH CONTRAST TECHNIQUE: Multidetector CT imaging of the chest was performed using the standard protocol during bolus administration of intravenous contrast. Multiplanar CT image reconstructions and MIPs were obtained to evaluate the vascular anatomy. CONTRAST:  100 cc Isovue COMPARISON:  None. FINDINGS: Mediastinum/Lymph Nodes: There are streaky artifacts from patient's large body habitus. No pulmonary embolus is noted. There is borderline cardiomegaly. No pericardial effusion. No mediastinal hematoma or  adenopathy. There is no hilar adenopathy. Small nonspecific mediastinal lymph nodes are noted. No axillary adenopathy. Lungs/Pleura: There is small right pleural effusion. No segmental infiltrate. Mild perihilar interstitial prominence bilaterally suspicious for mild interstitial edema. There is no bronchiectasis. No bronchitic changes are noted. Trace left pleural effusion with left base posterior atelectasis. Upper abdomen: Mild fatty infiltration of the liver. No adrenal gland mass is noted in visualized upper abdomen. Visualized tail of the pancreas and spleen is unremarkable. No focal hepatic mass. Musculoskeletal: No destructive rib lesions are noted. Sagittal images of the spine shows mild degenerative changes thoracic spine. Review of the MIP images confirms the above findings. IMPRESSION: 1. No pulmonary embolus is noted. Suboptimal study due to streak artifacts from patient's large body habitus. 2. No mediastinal hematoma or adenopathy.  No hilar adenopathy. 3. There is small right pleural effusion. Trace left pleural effusion. Mild atelectasis bilateral lower lobe posteriorly. Mild perihilar interstitial prominence bilateral suspicious for mild interstitial edema. No segmental infiltrate. 4. Mild degenerative changes thoracic spine. Electronically Signed   By: Lahoma Crocker M.D.   On: 09/26/2015 11:43   Dg Chest Port 1 View  09/26/2015  CLINICAL DATA:  Shortness of breath, onset this morning. EXAM: PORTABLE CHEST 1 VIEW COMPARISON:  01/23/2013 FINDINGS: Cardiomegaly with vascular congestion and interstitial prominence, likely interstitial edema. No confluent opacities or effusions. IMPRESSION: Diffuse interstitial prominence, likely mild interstitial edema. Electronically Signed   By: Rolm Baptise M.D.   On: 09/26/2015 10:35    EKG: Independently reviewed. Sinus rhythm with Premature atrial complexes Prolonged QT Abnormal ECG No significant change since last tracing  Assessment/Plan Principal  Problem:   Acute on chronic diastolic heart failure (HCC) Active Problems:   CKD (chronic kidney disease) stage 2, GFR 60-89 ml/min   Essential hypertension, benign   Type 2 diabetes, uncontrolled, with renal manifestation (HCC)   Hypokalemia   Shortness of breath   Bilateral leg edema   Obesity   Elevated troponin  1. Acute on chronic diastolic heart failure. Likely related to uncontrolled blood pressure secondary to noncompliance with medications and management of chronic illnesses. BNP. Chest x-ray reveals mild interstitial edema. Most recent echo 2014 revealing an EF of 25-30%. Patient has not seen PCP or cardiology in over 2 years. -Admit to telemetry -Obtain a 2-D echo -IV Lasix 40 mg twice a day -Obtain daily weights -Monitor intake and output -Resume antihypertensives   #2. Bilateral lower extremity edema. Likely related to #1. Right is greater than left. No recent travel  or immobility -IV Lasix as noted above -Doppler  #3. Chronic kidney disease stage II. Creatinine 1.44 on admission. Chart review indicates this appears to be close to his baseline. -Hold nephrotoxins -Monitor urine output -Outpatient follow-up with PCP  #4. Hypertension. Poor control. Patient admits to noncompliance with medications. Home meds include amlodipine, Lasix, Coreg and lisinopril -Resume amlodipine, Coreg. -IV Lasix as noted above -Resume lisinopril at lower dose -Monitor -Will need close outpatient follow-up - Case management for evaluation of a since ability to comply  #5. Diabetes. Uncontrolled. Serum glucose 211 on admission -We'll hold oral agents -Provide sliding scale insulin -Obtain a hemoglobin A1c -Diabetes consult -Will need close outpatient follow-up  #6. Hypokalemia. Mild. Likely related to Lasix -Will replete -Monitor -Check magnesium -Repeat in the morning  7. Obesity. BMI 48 -Nutritional consult  #8. Elevated troponin. Initial troponin 0.09. Likely related to  demand secondary to #1. No chest pain EKG without acute changes -Monitor on telemetry -Cycle troponin -Serial EKG   Code Status: full DVT Prophylaxis: Family Communication:  Disposition Plan: home when ready  Time spent: 36 minutes  [facility]

## 2015-09-26 NOTE — ED Provider Notes (Signed)
CSN: [id number]     Arrival date & time 09/26/15  0913 History   First MD Initiated Contact with Patient 09/26/15 [phone]     Chief Complaint  Patient presents with  . Shortness of Breath     (Consider location/radiation/quality/duration/timing/severity/associated sxs/prior Treatment) HPI Comments: Patient with history of CHF, HTN, CKD, DM, presents to the emergency department with chief complaint of shortness of breath. He states that symptoms started this morning after getting out of the shower. He denies any associated chest pain. Denies any aggravating or alleviating factors. Denies any associated back pain, numbness, weakness, or tingling. He denies any recent cough or fever, but states that he has been suffering from seasonal allergies. He reports moderate right lower extremity swelling and mild left lower extremity swelling. Denies any recent long travel or immobilization. Denies any history of PE, DVT, or ACS.  Patient takes lasix 61m daily.  He has not taken any of his morning meds.  The history is provided by the patient. No language interpreter was used.    Past Medical History  Diagnosis Date  . Allergy   . Hypertension   . Obesity   . [place] ([place]ive heart failure) [place]   . Chronic kidney disease   . Acute decompensated heart failure ([place] 01/03/2013  . CKD (chronic kidney disease) stage 2, GFR 60-89 ml/min 01/04/2013  . DM [place]diabetes mellitus) [place] 01/06/2013  . Hypertensive emergency 01/03/2013  . [place] 01/03[place]2013   Past Surgical History  Procedure Laterality Date  . Left and right heart catheterization with coronary angiogram N/A 01/07/2013    Procedure: LEFT AND RIGHT HEART CATHETERIZATION WITH CORONARY ANGIOGRAM;  Surgeon: ABirdie Riddle MD;  [facility];  Service: Cardiovascular;  Laterality: N/A;   Family History  Problem Relation Age of Onset  . Heart disease Father 671   MI  . Hypertension Father   . Hyperlipidemia Father    . Diabetes Mother   . Hypertension Mother    Social History  Substance Use Topics  . Smoking status: Never Smoker   . Smokeless tobacco: None  . Alcohol Use: No    Review of Systems  Constitutional: Negative for fever and chills.  Respiratory: Positive for shortness of breath.   Cardiovascular: Positive for leg swelling. Negative for chest pain.  Gastrointestinal: Negative for nausea, vomiting, diarrhea and constipation.  Genitourinary: Negative for dysuria.  All other systems reviewed and are negative.     Allergies  Review of patient's allergies indicates no known allergies.  Home Medications   Prior to Admission medications   Medication Sig Start Date End Date Taking? Authorizing Provider  amLODipine (NORVASC) 5 MG tablet Take 5 mg by mouth daily.    Historical Provider, MD  amLODipine (NORVASC) 5 MG tablet Take 1 tablet (5 mg total) by mouth daily. 05/31/14   HLucretia Kern DO  aspirin EC 81 MG EC tablet Take 1 tablet (81 mg total) by mouth daily. 01/10/13   JGeradine Girt DO  Blood Glucose Monitoring Suppl [facility]) W/DEVICE KIT 1 meter with lancets and test strips for daily BS testing QS 1 month 01/10/13   JGeradine Girt DO  carvedilol (COREG) 6.25 MG tablet Take 1 tablet (6.25 mg total) by mouth 2 (two) times daily with a meal. 04/23/14   HLucretia Kern DO  furosemide (LASIX) 40 MG tablet Take 0.5 tablets (20 mg total) by mouth daily. 04/23/14   HLucretia Kern DO  glucose blood (ONE TOUCH ULTRA TEST) test strip Use as directed to check blood sugar once a day. 04/23/14   Lucretia Kern, DO  lisinopril (PRINIVIL,ZESTRIL) 10 MG tablet Take 2 tablets (20 mg total) by mouth daily. 04/23/14   Lucretia Kern, DO  metFORMIN (GLUCOPHAGE) 1000 MG tablet Take 1 tablet (1,000 mg total) by mouth 2 (two) times daily with a meal. 04/23/14   Lucretia Kern, DO  ONETOUCH DELICA LANCETS 26J MISC 1 Device by Other route daily. Use as directed for glucose testing once a day.  04/23/14   Lucretia Kern, DO  potassium chloride (K-DUR) 10 MEQ tablet Take 4 tablets (40 mEq total) by mouth daily. 01/10/13   Geradine Girt, DO  pravastatin (PRAVACHOL) 40 MG tablet Take 1 tablet (40 mg total) by mouth daily. 05/10/14   Lucretia Kern, DO   BP 179/115 mmHg  Pulse 97  Temp(Src) 97.6 F (36.4 C) (Oral)  Resp 18  Ht 5' 11"  (1.803 m)  Wt 136.079 kg  BMI 41.86 kg/m2  SpO2 92% Physical Exam  Constitutional: He is oriented to person, place, and time. He appears well-developed and well-nourished.  HENT:  Head: Normocephalic and atraumatic.  Eyes: Conjunctivae and EOM are normal. Pupils are equal, round, and reactive to light. Right eye exhibits no discharge. Left eye exhibits no discharge. No scleral icterus.  Neck: Normal range of motion. Neck supple. No JVD present.  Cardiovascular: Normal rate, regular rhythm and normal heart sounds.  Exam reveals no gallop and no friction rub.   No murmur heard. Pulmonary/Chest: Effort normal and breath sounds normal. No respiratory distress. He has no wheezes. He has no rales. He exhibits no tenderness.  Abdominal: Soft. He exhibits no distension and no mass. There is no tenderness. There is no rebound and no guarding.  Musculoskeletal: Normal range of motion. He exhibits edema. He exhibits no tenderness.  Lower extremity swelling, right greater than left, non-tender, ROM and strength 5/5  Neurological: He is alert and oriented to person, place, and time.  Skin: Skin is warm and dry.  No evidence of cellulitis  Psychiatric: He has a normal mood and affect. His behavior is normal. Judgment and thought content normal.  Nursing note and vitals reviewed.   ED Course  Procedures (including critical care time) Results for orders placed or performed during the hospital encounter of 33/54/56  Basic metabolic panel  Result Value Ref Range   Sodium 141 135 - 145 mmol/L   Potassium 3.3 (L) 3.5 - 5.1 mmol/L   Chloride 104 101 - 111 mmol/L   CO2  23 22 - 32 mmol/L   Glucose, Bld 211 (H) 65 - 99 mg/dL   BUN 15 6 - 20 mg/dL   Creatinine, Ser 1.44 (H) 0.61 - 1.24 mg/dL   Calcium 8.6 (L) 8.9 - 10.3 mg/dL   GFR calc non Af Amer 57 (L) >60 mL/min   GFR calc Af Amer >60 >60 mL/min   Anion gap 14 5 - 15  CBC  Result Value Ref Range   WBC 7.9 4.0 - 10.5 K/uL   RBC 5.07 4.22 - 5.81 MIL/uL   Hemoglobin 13.7 13.0 - 17.0 g/dL   HCT 43.9 39.0 - 52.0 %   MCV 86.6 78.0 - 100.0 fL   MCH 27.0 26.0 - 34.0 pg   MCHC 31.2 30.0 - 36.0 g/dL   RDW 14.7 11.5 - 15.5 %   Platelets 209 150 - 400 K/uL  Brain natriuretic  peptide  Result Value Ref Range   B Natriuretic Peptide 590.2 (H) 0.0 - 100.0 pg/mL  D-dimer, quantitative (not at [facility])  Result Value Ref Range   D-Dimer, Quant 1.02 (H) 0.00 - 0.50 ug/mL-FEU  I-stat troponin, ED  Result Value Ref Range   Troponin i, poc 0.09 (HH) 0.00 - 0.08 ng/mL   Comment NOTIFIED PHYSICIAN    Comment 3           Ct Angio Chest Pe W/cm &/or Wo Cm  09/26/2015  CLINICAL DATA:  Shortness of Breath EXAM: CT ANGIOGRAPHY CHEST WITH CONTRAST TECHNIQUE: Multidetector CT imaging of the chest was performed using the standard protocol during bolus administration of intravenous contrast. Multiplanar CT image reconstructions and MIPs were obtained to evaluate the vascular anatomy. CONTRAST:  100 cc Isovue COMPARISON:  None. FINDINGS: Mediastinum/Lymph Nodes: There are streaky artifacts from patient's large body habitus. No pulmonary embolus is noted. There is borderline cardiomegaly. No pericardial effusion. No mediastinal hematoma or adenopathy. There is no hilar adenopathy. Small nonspecific mediastinal lymph nodes are noted. No axillary adenopathy. Lungs/Pleura: There is small right pleural effusion. No segmental infiltrate. Mild perihilar interstitial prominence bilaterally suspicious for mild interstitial edema. There is no bronchiectasis. No bronchitic changes are noted. Trace left pleural effusion with left base posterior  atelectasis. Upper abdomen: Mild fatty infiltration of the liver. No adrenal gland mass is noted in visualized upper abdomen. Visualized tail of the pancreas and spleen is unremarkable. No focal hepatic mass. Musculoskeletal: No destructive rib lesions are noted. Sagittal images of the spine shows mild degenerative changes thoracic spine. Review of the MIP images confirms the above findings. IMPRESSION: 1. No pulmonary embolus is noted. Suboptimal study due to streak artifacts from patient's large body habitus. 2. No mediastinal hematoma or adenopathy.  No hilar adenopathy. 3. There is small right pleural effusion. Trace left pleural effusion. Mild atelectasis bilateral lower lobe posteriorly. Mild perihilar interstitial prominence bilateral suspicious for mild interstitial edema. No segmental infiltrate. 4. Mild degenerative changes thoracic spine. Electronically Signed   By: Lahoma Crocker M.D.   On: 09/26/2015 11:43   Dg Chest Port 1 View  09/26/2015  CLINICAL DATA:  Shortness of breath, onset this morning. EXAM: PORTABLE CHEST 1 VIEW COMPARISON:  01/23/2013 FINDINGS: Cardiomegaly with vascular congestion and interstitial prominence, likely interstitial edema. No confluent opacities or effusions. IMPRESSION: Diffuse interstitial prominence, likely mild interstitial edema. Electronically Signed   By: Rolm Baptise M.D.   On: 09/26/2015 10:35    I have personally reviewed and evaluated these images and lab results as part of my medical decision-making.   EKG Interpretation   Date/Time:  Monday September 26 2015 09:19:48 EDT Ventricular Rate:  95 PR Interval:  152 QRS Duration: 82 QT Interval:  412 QTC Calculation: 517 R Axis:   70 Text Interpretation:  Sinus rhythm with Premature atrial complexes  Prolonged QT Abnormal ECG No significant change since last tracing  Confirmed by KOHUT  MD, STEPHEN ([id number]) on 09/26/2015 11:08:31 AM      MDM   Final diagnoses:  Shortness of breath  Leg swelling   Elevated troponin    Patient presents with shortness of breath. Started this morning. Large amount of swelling on legs, right leg greater than left. No history of DVT or PE. Will check d-dimer along with basic labs.  Mildly elevated troponin 0.09. BNP is 590, nothing to compare to. D-dimer is 1.02, will proceed with CT angiogram of chest to rule out PE. Potassium slightly low  at 3.3, will supplement this year. Creatinine is 1.44, which appears to be baseline for the patient. He has a history of CKD.  CT negative for pulmonary embolus. Patient seen by and discussed with Dr. Wilson Singer, who agrees with plan for admission to trend troponin, and diuresis.  Patton Salles, NP and Dr. Marily Memos for admitting the patient.  DVT studies pending.    Montine Circle, PA-C 09/26/15 1223  Virgel Manifold, MD 10/02/15 2138

## 2015-09-26 NOTE — ED Notes (Signed)
Pt transported to CT ?

## 2015-09-27 ENCOUNTER — Observation Stay (HOSPITAL_BASED_OUTPATIENT_CLINIC_OR_DEPARTMENT_OTHER): Payer: BLUE CROSS/BLUE SHIELD

## 2015-09-27 ENCOUNTER — Observation Stay (HOSPITAL_COMMUNITY): Payer: BLUE CROSS/BLUE SHIELD

## 2015-09-27 DIAGNOSIS — M7989 Other specified soft tissue disorders: Secondary | ICD-10-CM

## 2015-09-27 LAB — GLUCOSE, CAPILLARY
GLUCOSE-CAPILLARY: 146 mg/dL — AB (ref 65–99)
Glucose-Capillary: 164 mg/dL — ABNORMAL HIGH (ref 65–99)
Glucose-Capillary: 174 mg/dL — ABNORMAL HIGH (ref 65–99)
Glucose-Capillary: 135 mg/dL — ABNORMAL HIGH (ref 65–99)

## 2015-09-27 LAB — HEMOGLOBIN A1C
HEMOGLOBIN A1C: 8.7 % — AB (ref 4.8–5.6)
Mean Plasma Glucose: 203 mg/dL

## 2015-09-27 LAB — BASIC METABOLIC PANEL
ANION GAP: 13 (ref 5–15)
BUN: 16 mg/dL (ref 6–20)
CHLORIDE: 99 mmol/L — AB (ref 101–111)
CO2: 28 mmol/L (ref 22–32)
Calcium: 8.6 mg/dL — ABNORMAL LOW (ref 8.9–10.3)
Creatinine, Ser: 1.52 mg/dL — ABNORMAL HIGH (ref 0.61–1.24)
GFR calc Af Amer: >60 mL/min (ref >60)
GFR, EST NON AFRICAN AMERICAN: 54 mL/min — AB (ref >60)
GLUCOSE: 156 mg/dL — AB (ref 65–99)
POTASSIUM: 3.8 mmol/L (ref 3.5–5.1)
Sodium: 140 mmol/L (ref 135–145)

## 2015-09-27 LAB — ECHOCARDIOGRAM COMPLETE
HEIGHTINCHES: 71 in
Weight: 5404.8 oz

## 2015-09-27 MED ORDER — POTASSIUM CHLORIDE CRYS ER 20 MEQ PO TBCR
40.0000 meq | EXTENDED_RELEASE_TABLET | Freq: Two times a day (BID) | ORAL | Status: DC
  Administered 2015-09-27: 40 meq via ORAL
  Filled 2015-09-27: qty 2

## 2015-09-27 MED ORDER — INSULIN ASPART 100 UNIT/ML ~~LOC~~ SOLN
0.0000 [IU] | Freq: Three times a day (TID) | SUBCUTANEOUS | Status: DC
  Administered 2015-09-27 (×2): 3 [IU] via SUBCUTANEOUS
  Administered 2015-09-27 – 2015-09-28 (×2): 2 [IU] via SUBCUTANEOUS
  Administered 2015-09-28 – 2015-09-29 (×4): 3 [IU] via SUBCUTANEOUS
  Administered 2015-09-29 – 2015-09-30 (×2): 2 [IU] via SUBCUTANEOUS
  Administered 2015-09-30 – 2015-10-01 (×4): 3 [IU] via SUBCUTANEOUS

## 2015-09-27 NOTE — Plan of Care (Signed)
Problem: Food- and Nutrition-Related Knowledge Deficit (NB-1.1) Goal: Nutrition education Formal process to instruct or train a patient/client in a skill or to impart knowledge to help patients/clients voluntarily manage or modify food choices and eating behavior to maintain or improve health. Outcome: Progressing  RD consulted for nutrition education regarding diabetes.     Lab Results  Component Value Date    HGBA1C 8.7* 09/26/2015    RD provided "Carbohydrate Counting for People with Diabetes" handout from the Academy of Nutrition and Dietetics. Discussed different food groups and their effects on blood sugar, emphasizing carbohydrate-containing foods. Provided list of carbohydrates and recommended serving sizes of common foods.  Discussed importance of controlled and consistent carbohydrate intake throughout the day. Provided examples of ways to balance meals/snacks and encouraged intake of high-fiber, whole grain complex carbohydrates. Teach back method used.  Expect fair compliance.  Body mass index is 47.13 kg/(m^2). Pt meets criteria for obese class III based on current BMI.  Current diet order is heart healthy/carb modifed, patient is consuming approximately 100% of meals at this time. Labs and medications reviewed. No further nutrition interventions warranted at this time. RD contact information provided. If additional nutrition issues arise, please re-consult RD.  Satira Anis. Kacin Dancy, MS, RD LDN After Hours/Weekend Pager [phone]

## 2015-09-27 NOTE — Progress Notes (Signed)
Heart Failure Navigator Consult Note  Presentation: Richard [name] is a very pleasant 45 y.o. male with a past medical history that includes diabetes, diastolic heart failure, hypertension, chronic kidney disease stage II presents to emergency Department chief complaint sudden onset shortness of breath persistent lower extremity edema. Initial evaluation concerning for acute on chronic diastolic heart failure, elevated troponin, acute on chronic renal failure.  Information is obtained from the patient. He reports noncompliance with his home medications particularly antihypertensives and Lasix. he reports intermittent bilateral lower extremity edema over the last several weeks. He he states as a result he has been taken his Lasix every day. His other medications he takes "every now and then to make him last longer". This morning he awakened with sudden worsening shortness of breath with exertion. He reports becoming very short of breath while taking a shower this morning. Denies chest pain palpitations, dizziness syncope or near-syncope. Associated symptoms do include worsening lower extremity edema with the right being worse than the left. He denies any orthopnea. He denies recent travel. His abdominal pain nausea vomiting diarrhea. He denies dysuria hematuria frequency or urgency.   Past Medical History  Diagnosis Date  . Allergy   . Hypertension   . Obesity   . CHF (congestive heart failure) (HCC)     systolic  . Chronic kidney disease   . Acute decompensated heart failure ([place]) 01/03/2013  . CKD (chronic kidney disease) stage 2, GFR 60-89 ml/min 01/04/2013  . DM (diabetes mellitus) ([place]) 01/06/2013  . Hypertensive emergency 01/03/2013  . Acute decompensated heart failure ([place]) 01/03/2013  . H/O diastolic dysfunction     Social History   Social History  . Marital Status: Single    Spouse Name: N/A  . Number of Children: N/A  . Years of Education: N/A   Social History Main Topics  . Smoking  status: Never Smoker   . Smokeless tobacco: None  . Alcohol Use: No  . Drug Use: No  . Sexual Activity: Not Asked   Other Topics Concern  . None   Social History Narrative   Work or School: Engineer, agricultural, homeless Chief of Staff      Home Situation: lives alone      Spiritual Beliefs: Christian      Lifestyle: 15 minutes dialy walking - working up; working on diet             ECHO: pending BNP    Component Value Date/Time   BNP 590.2* 09/26/2015 0941    ProBNP    Component Value Date/Time   PROBNP 1144.0* 01/09/2013 0830     Education Assessment and Provision:  Detailed education and instructions provided on heart failure disease management including the following:  Signs and symptoms of Heart Failure When to call the physician Importance of daily weights Low sodium diet Fluid restriction Medication management Anticipated future follow-up appointments  Patient education given on each of the above topics.  Patient acknowledges understanding and acceptance of all instructions.  I spoke with Richard [name] regarding his hospitalization and HF diagnosis.  He admits that he was told years ago that he had HF.  He has a scale and no longer weighs everyday.  I reviewed the importance of daily weights and how they relate to the signs and symptoms of HF as well as when to call the physician.  He does not take his medications as prescribed--only "here and there".  He says that he uses no added salt however recalls dietary intake  of high sodium foods such as pizza.   I reinforced a low sodium diet and restriction to 2000 mg per day.  I reviewed high sodium foods to avoid.  He lives alone and works as a Loss adjuster, chartered at [employer].  He admits that lately he "becomes anxious to walk" in from his car across large parking lot at work due to his increasing SOB.  He admits that he has not seen a physician in around 2 years.  He [facility] with [facility].   I will arrange follow-up there after discharge.    Education Materials:  "Living Better With Heart Failure" Booklet, Daily Weight Tracker Tool   High Risk Criteria for Readmission and/or Poor Patient Outcomes:  (Recommend Follow-up with [facility])--yes patient will benefit from [facility] follow-up for ongoing support and education   EF <30%-old 2014 echo 25-30%-- new pending  2 or more admissions in 6 months- No  Difficult social situation- No  Demonstrates medication noncompliance- Yes    Barriers of Care:  Knowledge and compliance  Discharge Planning:   Plans to return home alone

## 2015-09-27 NOTE — Progress Notes (Signed)
  Echocardiogram 2D Echocardiogram has been performed.  Donata Clay 09/27/2015, 12:49 PM

## 2015-09-27 NOTE — Progress Notes (Signed)
VASCULAR LAB PRELIMINARY  PRELIMINARY  PRELIMINARY  PRELIMINARY  Bilateral lower extremity venous duplex completed.    Preliminary report:  Technically difficult due to swelling. Bilateral:  No evidence of DVT, superficial thrombosis, or Baker's Cyst.   Jase Himmelberger, RVS 09/27/2015, 2:46 PM

## 2015-09-27 NOTE — Progress Notes (Signed)
Inpatient Diabetes Program Recommendations  AACE/ADA: New Consensus Statement on Inpatient Glycemic Control (2015)  Target Ranges:  Prepandial:   less than 140 mg/dL      Peak postprandial:   less than 180 mg/dL (1-2 hours)      Critically ill patients:  140 - 180 mg/dL   Results for Richard [name], Richard [name] (MRN [id number]) as of 09/27/2015 10:57  Ref. Range 09/26/2015 17:38 09/26/2015 21:01 09/27/2015 05:55  Glucose-Capillary Latest Ref Range: 65-99 mg/dL 157 (H) 146 (H) 164 (H)   Results for ELAD, BUR (MRN [id number]) as of 09/27/2015 10:57  Ref. Range 09/26/2015 13:14  Hemoglobin A1C Latest Ref Range: 4.8-5.6 % 8.7 (H)    Admit with: Dyspnea/bilateral LE edema  History: DM, CHF, CKD  Home DM Meds: Metformin 1000 mg bid  Current Insulin Orders: Novolog Moderate Correction Scale/ SSI (0-15 units) TID AC + HS      -Spoke to patient about his current A1c of 8.7%.  Explained what an A1c is and what it measures.  Reminded patient that his goal A1c is 7% or less per ADA standards to prevent both acute and long-term complications.  Explained to patient the extreme importance of good glucose control at home.  Encouraged patient to check his CBGs at least daily at home (vary CBG check everyday) and to record all CBGs in a logbook for his PCP to review.  -Patient told me that he has only been taking Metformin 1000 mg once daily (not twice daily) as prescribed.  Has not been checking CBGs regularly (does have CBG meter at home).  Has appointment with his PCP (Dr. Colin Benton) on 10/07/15.  Patient had questions about his DM nutrition plan at home.  Encouraged patient to avoid beverages with sugar (regular soda, sweet tea, lemonade, fruit juice) and to consume mostly water.  Discussed what foods contain carbohydrates and how carbohydrates affect the body's blood sugar levels.  Encouraged patient to be careful with his portion sizes (especially grains, starchy vegetables, and fruits).  Explained to patient  that men should have 60-75 grams of carbohydrates per meal per day.  -Patient also had questions about the smoothies he has been making at home.  Has been using apple juice to make then and will buy smoothies at a local restaurant as well.  Reminded pt that he needs to account for all the carbohydrates he is adding to his smoothies and encouraged patient to check the nutritional info on the smoothies at his local restaurant.     --Will follow patient during hospitalization--  Wyn Quaker RN, MSN, CDE Diabetes Coordinator Inpatient Glycemic Control Team Team Pager: [phone] (8a-5p)

## 2015-09-27 NOTE — Progress Notes (Signed)
Pt OOB to recliner.  Call bell at reach.  Instructed to call for assistance as needed.  Verbalized understanding.  Will continue to monitor.  Karie Kirks, Therapist, sports.

## 2015-09-27 NOTE — Progress Notes (Signed)
Ref: Lucretia Kern., DO   Subjective:  Feeling better. Good diuresis with IV lasix. Some non-compliance. Echocardiogram pending. Afebrile.  Objective:  Vital Signs in the last 24 hours: Temp:  [97.5 F (36.4 C)-97.9 F (36.6 C)] 97.7 F (36.5 C) (04/25 1605) Pulse Rate:  [78-88] 88 (04/25 1742) Cardiac Rhythm:  [-] Normal sinus rhythm (04/25 1902) Resp:  [17-18] 18 (04/25 1605) BP: (123-154)/(96-124) 154/100 mmHg (04/25 1742) SpO2:  [93 %-100 %] 100 % (04/25 1605) Weight:  [153.225 kg (337 lb 12.8 oz)] 153.225 kg (337 lb 12.8 oz) (04/25 0556)  Physical Exam: BP Readings from Last 1 Encounters:  09/27/15 154/100    Wt Readings from Last 1 Encounters:  09/27/15 153.225 kg (337 lb 12.8 oz)    Weight change:   HEENT: [place]/AT, Eyes-Brown, PERL, EOMI, Conjunctiva-Pink, Sclera-Non-icteric Neck: + JVD, No bruit, Trachea midline. Lungs:  Crackles at bases, Bilateral. Cardiac:  Regular rhythm, normal S1 and S2, no S3.  Abdomen:  Soft, non-tender. Extremities:  2 + lower leg edema present. No cyanosis. No clubbing. CNS: AxOx3, Cranial nerves grossly intact, moves all 4 extremities. Right handed. Skin: Warm and dry.   Intake/Output from previous day: 04/24 0701 - 04/25 0700 In: 840 [P.O.:840] Out: 5050 [Urine:5050]    Lab Results: BMET    Component Value Date/Time   NA 140 09/27/2015 0306   NA 141 09/26/2015 0941   NA 138 05/07/2014 0939   K 3.8 09/27/2015 0306   K 3.3* 09/26/2015 0941   K 3.7 05/07/2014 0939   CL 99* 09/27/2015 0306   CL 104 09/26/2015 0941   CL 100 05/07/2014 0939   CO2 28 09/27/2015 0306   CO2 23 09/26/2015 0941   CO2 27 05/07/2014 0939   GLUCOSE 156* 09/27/2015 0306   GLUCOSE 211* 09/26/2015 0941   GLUCOSE 106* 05/07/2014 0939   BUN 16 09/27/2015 0306   BUN 15 09/26/2015 0941   BUN 19 05/07/2014 0939   CREATININE 1.52* 09/27/2015 0306   CREATININE 1.44* 09/26/2015 0941   CREATININE 1.4 05/07/2014 0939   CALCIUM 8.6* 09/27/2015 0306   CALCIUM  8.6* 09/26/2015 0941   CALCIUM 8.9 05/07/2014 0939   GFRNONAA 54* 09/27/2015 0306   GFRNONAA 57* 09/26/2015 0941   GFRNONAA 76* 01/10/2013 0907   GFRAA >60 09/27/2015 0306   GFRAA >60 09/26/2015 0941   GFRAA 88* 01/10/2013 0907   CBC    Component Value Date/Time   WBC 7.9 09/26/2015 0941   WBC 7.0 01/03/2013 1800   RBC 5.07 09/26/2015 0941   RBC 4.81 01/03/2013 1800   HGB 13.7 09/26/2015 0941   HGB 13.0* 01/03/2013 1800   HCT 43.9 09/26/2015 0941   HCT 42.9* 01/03/2013 1800   PLT 209 09/26/2015 0941   MCV 86.6 09/26/2015 0941   MCV 89.2 01/03/2013 1800   MCH 27.0 09/26/2015 0941   MCH 27.0 01/03/2013 1800   MCHC 31.2 09/26/2015 0941   MCHC 30.3* 01/03/2013 1800   RDW 14.7 09/26/2015 0941   HEPATIC Function Panel No results for input(s): PROT in the last 8760 hours.  Invalid input(s):  ALBUMIN,  AST,  ALT,  ALKPHOS,  BILIDIR,  IBILI HEMOGLOBIN A1C No components found for: HGA1C,  MPG CARDIAC ENZYMES Lab Results  Component Value Date   TROPONINI 0.13* 09/26/2015   TROPONINI 0.15* 09/26/2015   TROPONINI <0.30 01/04/2013   BNP No results for input(s): PROBNP in the last 8760 hours. TSH No results for input(s): TSH in the last 8760 hours. CHOLESTEROL  Recent Labs  09/26/15 1314  CHOL 139    Scheduled Meds: . amLODipine  5 mg Oral Daily  . carvedilol  6.25 mg Oral BID WC  . furosemide  40 mg Intravenous BID  . insulin aspart  0-15 Units Subcutaneous TID WC  . insulin aspart  0-5 Units Subcutaneous QHS  . lisinopril  10 mg Oral Daily  . potassium chloride  40 mEq Oral BID   Continuous Infusions:  PRN Meds:.nitroGLYCERIN  Assessment/Plan: Acute on chronic diastolic heart failure (HCC) CKD (chronic kidney disease) stage 2, GFR 60-89 ml/min Essential hypertension, benign Type 2 diabetes, uncontrolled, with renal manifestation (HCC) Hypokalemia-improving Shortness of breath Bilateral leg edema Obesity Elevated troponin-demand ischemia.  Continue  diuresis. Increase activity as tolerated.       Dixie Dials  MD  09/27/2015, 7:57 PM

## 2015-09-28 DIAGNOSIS — R778 Other specified abnormalities of plasma proteins: Secondary | ICD-10-CM | POA: Diagnosis present

## 2015-09-28 DIAGNOSIS — N179 Acute kidney failure, unspecified: Secondary | ICD-10-CM | POA: Diagnosis present

## 2015-09-28 DIAGNOSIS — Z6841 Body Mass Index (BMI) 40.0 and over, adult: Secondary | ICD-10-CM | POA: Diagnosis not present

## 2015-09-28 DIAGNOSIS — I5043 Acute on chronic combined systolic (congestive) and diastolic (congestive) heart failure: Secondary | ICD-10-CM | POA: Diagnosis not present

## 2015-09-28 DIAGNOSIS — E1165 Type 2 diabetes mellitus with hyperglycemia: Secondary | ICD-10-CM | POA: Diagnosis present

## 2015-09-28 DIAGNOSIS — Z79899 Other long term (current) drug therapy: Secondary | ICD-10-CM | POA: Diagnosis not present

## 2015-09-28 DIAGNOSIS — I429 Cardiomyopathy, unspecified: Secondary | ICD-10-CM | POA: Diagnosis present

## 2015-09-28 DIAGNOSIS — E876 Hypokalemia: Secondary | ICD-10-CM | POA: Diagnosis present

## 2015-09-28 DIAGNOSIS — N183 Chronic kidney disease, stage 3 (moderate): Secondary | ICD-10-CM | POA: Diagnosis not present

## 2015-09-28 DIAGNOSIS — Z9114 Patient's other noncompliance with medication regimen: Secondary | ICD-10-CM | POA: Diagnosis not present

## 2015-09-28 DIAGNOSIS — Z7982 Long term (current) use of aspirin: Secondary | ICD-10-CM | POA: Diagnosis not present

## 2015-09-28 DIAGNOSIS — I5023 Acute on chronic systolic (congestive) heart failure: Secondary | ICD-10-CM | POA: Diagnosis present

## 2015-09-28 DIAGNOSIS — N182 Chronic kidney disease, stage 2 (mild): Secondary | ICD-10-CM | POA: Diagnosis present

## 2015-09-28 DIAGNOSIS — I130 Hypertensive heart and chronic kidney disease with heart failure and stage 1 through stage 4 chronic kidney disease, or unspecified chronic kidney disease: Secondary | ICD-10-CM | POA: Diagnosis present

## 2015-09-28 DIAGNOSIS — E1122 Type 2 diabetes mellitus with diabetic chronic kidney disease: Secondary | ICD-10-CM | POA: Diagnosis present

## 2015-09-28 DIAGNOSIS — I10 Essential (primary) hypertension: Secondary | ICD-10-CM | POA: Diagnosis not present

## 2015-09-28 DIAGNOSIS — Z7984 Long term (current) use of oral hypoglycemic drugs: Secondary | ICD-10-CM | POA: Diagnosis not present

## 2015-09-28 DIAGNOSIS — I248 Other forms of acute ischemic heart disease: Secondary | ICD-10-CM | POA: Diagnosis present

## 2015-09-28 DIAGNOSIS — R0602 Shortness of breath: Secondary | ICD-10-CM | POA: Diagnosis present

## 2015-09-28 LAB — BASIC METABOLIC PANEL
ANION GAP: 13 (ref 5–15)
BUN: 17 mg/dL (ref 6–20)
CHLORIDE: 102 mmol/L (ref 101–111)
CO2: 27 mmol/L (ref 22–32)
CREATININE: 1.48 mg/dL — AB (ref 0.61–1.24)
Calcium: 8.6 mg/dL — ABNORMAL LOW (ref 8.9–10.3)
GFR calc non Af Amer: 56 mL/min — ABNORMAL LOW (ref >60)
Glucose, Bld: 151 mg/dL — ABNORMAL HIGH (ref 65–99)
Potassium: 3.1 mmol/L — ABNORMAL LOW (ref 3.5–5.1)
Sodium: 142 mmol/L (ref 135–145)

## 2015-09-28 LAB — GLUCOSE, CAPILLARY
Glucose-Capillary: 135 mg/dL — ABNORMAL HIGH (ref 65–99)
Glucose-Capillary: 155 mg/dL — ABNORMAL HIGH (ref 65–99)
Glucose-Capillary: 166 mg/dL — ABNORMAL HIGH (ref 65–99)
Glucose-Capillary: 153 mg/dL — ABNORMAL HIGH (ref 65–99)

## 2015-09-28 MED ORDER — LOSARTAN POTASSIUM 25 MG PO TABS: 25.0000 mg | ORAL_TABLET | Freq: Once | ORAL | Status: DC

## 2015-09-28 MED ORDER — ENOXAPARIN SODIUM 80 MG/0.8ML ~~LOC~~ SOLN
70.0000 mg | SUBCUTANEOUS | Status: DC
  Administered 2015-09-28: 70 mg via SUBCUTANEOUS
  Filled 2015-09-28 (×3): qty 0.8

## 2015-09-28 MED ORDER — MAGNESIUM SULFATE 2 GM/50ML IV SOLN
2.0000 g | Freq: Once | INTRAVENOUS | Status: AC
  Administered 2015-09-28: 2 g via INTRAVENOUS
  Filled 2015-09-28: qty 50

## 2015-09-28 MED ORDER — SPIRONOLACTONE 25 MG PO TABS
12.5000 mg | ORAL_TABLET | Freq: Every day | ORAL | Status: DC
  Administered 2015-09-28 – 2015-10-01 (×4): 12.5 mg via ORAL
  Filled 2015-09-28 (×4): qty 1

## 2015-09-28 MED ORDER — POTASSIUM CHLORIDE CRYS ER 20 MEQ PO TBCR
40.0000 meq | EXTENDED_RELEASE_TABLET | Freq: Three times a day (TID) | ORAL | Status: DC
  Administered 2015-09-28 (×3): 40 meq via ORAL
  Filled 2015-09-28 (×4): qty 2

## 2015-09-28 MED ORDER — ISOSORB DINITRATE-HYDRALAZINE 20-37.5 MG PO TABS
1.0000 | ORAL_TABLET | Freq: Three times a day (TID) | ORAL | Status: DC
  Administered 2015-09-28 – 2015-10-01 (×9): 1 via ORAL
  Filled 2015-09-28 (×10): qty 1

## 2015-09-28 MED ORDER — SACUBITRIL-VALSARTAN 24-26 MG PO TABS: 1.0000 | ORAL_TABLET | Freq: Two times a day (BID) | ORAL | Status: DC

## 2015-09-28 MED ORDER — LOSARTAN POTASSIUM 50 MG PO TABS
50.0000 mg | ORAL_TABLET | Freq: Once | ORAL | Status: AC
  Administered 2015-09-29: 50 mg via ORAL
  Filled 2015-09-28: qty 1

## 2015-09-28 MED ORDER — FUROSEMIDE 10 MG/ML IJ SOLN
40.0000 mg | Freq: Once | INTRAMUSCULAR | Status: AC
  Administered 2015-09-28: 40 mg via INTRAVENOUS
  Filled 2015-09-28: qty 4

## 2015-09-28 MED ORDER — FUROSEMIDE 10 MG/ML IJ SOLN
80.0000 mg | Freq: Two times a day (BID) | INTRAMUSCULAR | Status: DC
  Administered 2015-09-28 – 2015-09-29 (×2): 80 mg via INTRAVENOUS
  Filled 2015-09-28 (×2): qty 8

## 2015-09-28 NOTE — Care Management Note (Signed)
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 Patient has not seen a physician for about 2 yrs. He states that he was feeling better and only took his medication as needed. CM educated the patient on the importance of staying under a physicians care. Patient is willing to do the things that he needs to do to get better; PCP is Dr Maudie Mercury; Has private insurance with BCBS with prescription drug coverage/ pharmacy of choice is [facility]. He states that eats a diet low in sodium and exercise with weights ( he is cautious not to do weight lifting until his muscles become fatigue). Heart Failure Team to see pt.  Expected Discharge Date:  09/30/15               Expected Discharge Plan:  Home/Self Care  Discharge planning Services  CM Consult  Status of Service:  In process, will continue to follow  Sherrilyn Rist [id number] 09/28/2015, 10:22 AM

## 2015-09-28 NOTE — Consult Note (Signed)
Advanced Heart Failure Team Consult Note  Referring Physician: Dr. Doylene Canard Primary Physician: Colin Benton, DO.  Primary Cardiologist:  Dr. Doylene Canard  Reason for Consultation: A/C diastolic HF  HPI:    Richard [name] is a 45 y.o. male with a past medical history that includes diabetes, chronic combined heart failure, hypertension, chronic kidney disease stage II who presented to [facility] 09/26/15 with worsening SOB and LE edema.   He reported medical non-compliance on his lasix regimen, but had been taking more regularly recently with worsening swelling.   Pertinent admission labs include K 3.3, Creatinine 1.44, BNP 590.2, Troponin mildly elevated without trend. D-Dimer 1.02, Hgb A1C 8.7. CXR showed diffuse interstitial prominence, likely mild interstitial edema.  Chest CTA showed no pulmonary embolus but sub-optimal study due to pt size.    Dr. Doylene Canard usually sees patient.  HF team consulted when pt requested a second opinion.   Echo 09/27/2015 20-25%, grade 1 DD, RV mild dilated, moderately reduced, severe RAE.    He states he was his USOH up until the past week or so.  He has been taking lasix for an unclear amount of time, was previously non-compliant.  States he can walk the 0.5 mile from this parking lot to his work without difficult on the way to work, but had to take a break on the way back (up hill). Some SOB with steps, none with ADLs.  He does not weight regularly and doesn't really watch diet or fluid intake.  States he cannot remember to take medicines twice a day as he works two jobs. Denies CP, lightheadedness, dizziness, or near-syncope. No palpitations.  Does have "tightness" in upper legs when walking distances.  Creatinine stable on lasix 40 mg IV BID. Potassium 3.1 this am. Supp ordered.  Out 5.5 L so far and down 9 lbs this admission.  He has poor follow up so his last weight in the system was 320.5 lbs in 2015. Unclear baseline.   Social: Works 2 jobs. 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   Review of Systems: [y] = yes, _0  = no   General: Weight gain [y]; Weight loss _1 ; Anorexia _2 ; Fatigue [y]; Fever _3 ; Chills _4 ; Weakness _5   Cardiac: Chest pain/pressure _6 ; Resting SOB _7 ; Exertional SOB [y]; Orthopnea [y]; Pedal Edema [y]; Palpitations _8 ; Syncope _9 ; Presyncope _10 ; Paroxysmal nocturnal dyspnea_11   Pulmonary: Cough _12 ; Wheezing_13 ; Hemoptysis_14 ; Sputum _15 ; Snoring _16   GI: Vomiting_17 ; Dysphagia_18 ; Melena_19 ; Hematochezia _20 ; Heartburn_21 ; Abdominal pain _22 ; Constipation _23 ; Diarrhea _24 ; BRBPR _25   GU: Hematuria_26 ; Dysuria _27 ; Nocturia_28   Vascular: Pain in legs with walking [y]; Pain in feet with lying flat _29 ; Non-healing sores _30 ; Stroke _31 ; TIA _32 ; Slurred speech _33 ;  Neuro: Headaches_34 ; Vertigo_35 ; Seizures_36 ; Paresthesias_37 ;Blurred vision _38 ; Diplopia _39 ; Vision changes _40   Ortho/Skin: Arthritis [y]; Joint pain [y]; Muscle pain _41 ; Joint swelling _42 ; Back Pain [y]; Rash _43   Psych: Depression_44 ; Anxiety_45   Heme: Bleeding problems _46 ; Clotting disorders _47 ; Anemia _48   Endocrine: Diabetes _49 ; Thyroid dysfunction_50   Home Medications Prior to Admission medications   Medication Sig Start Date End Date Taking? Authorizing Provider  amLODipine (NORVASC) 5 MG tablet Take 1 tablet (  5 mg total) by mouth daily. 05/31/14  Yes Lucretia Kern, DO  Blood Glucose Monitoring Suppl [facility] BLOOD GLUCOSE METER) W/DEVICE KIT 1 meter with lancets and test strips for daily BS testing QS 1 month 01/10/13  Yes Geradine Girt, DO  furosemide (LASIX) 40 MG tablet Take 0.5 tablets (20 mg total) by mouth daily. 04/23/14  Yes Lucretia Kern, DO  magnesium 30 MG tablet Take 30 mg by mouth 2 (two) times daily.   Yes Historical Provider, MD  metFORMIN (GLUCOPHAGE) 1000 MG tablet Take 1 tablet (1,000 mg total) by mouth 2 (two) times daily with a meal. 04/23/14  Yes Lucretia Kern, DO  Multiple Vitamin  (MULTIVITAMIN WITH MINERALS) TABS tablet Take 1 tablet by mouth daily.   Yes Historical Provider, MD  Omega-3 Fatty Acids (FISH OIL) 1000 MG CAPS Take 1 capsule by mouth daily.   Yes Historical Provider, MD  [facility] DELICA LANCETS 73U MISC 1 Device by Other route daily. Use as directed for glucose testing once a day. 04/23/14  Yes Lucretia Kern, DO  potassium chloride (K-DUR) 10 MEQ tablet Take 4 tablets (40 mEq total) by mouth daily. Patient taking differently: Take 10 mEq by mouth daily.  01/10/13  Yes Geradine Girt, DO  aspirin EC 81 MG EC tablet Take 1 tablet (81 mg total) by mouth daily. Patient not taking: Reported on 09/26/2015 01/10/13   Geradine Girt, DO  carvedilol (COREG) 6.25 MG tablet Take 1 tablet (6.25 mg total) by mouth 2 (two) times daily with a meal. Patient not taking: Reported on 09/26/2015 04/23/14   Lucretia Kern, DO  glucose blood (ONE TOUCH ULTRA TEST) test strip Use as directed to check blood sugar once a day. Patient not taking: Reported on 09/26/2015 04/23/14   Lucretia Kern, DO  lisinopril (PRINIVIL,ZESTRIL) 10 MG tablet Take 2 tablets (20 mg total) by mouth daily. Patient not taking: Reported on 09/26/2015 04/23/14   Lucretia Kern, DO  pravastatin (PRAVACHOL) 40 MG tablet Take 1 tablet (40 mg total) by mouth daily. Patient not taking: Reported on 09/26/2015 05/10/14   Lucretia Kern, DO    Past Medical History: Past Medical History  Diagnosis Date  . Allergy   . Hypertension   . Obesity   . CHF (congestive heart failure) (HCC)     systolic  . Chronic kidney disease   . Acute decompensated heart failure ([place]) 01/03/2013  . CKD (chronic kidney disease) stage 2, GFR 60-89 ml/min 01/04/2013  . DM (diabetes mellitus) ([place]) 01/06/2013  . Hypertensive emergency 01/03/2013  . Acute decompensated heart failure ([place]) 01/03/2013  . H/O diastolic dysfunction     Past Surgical History: Past Surgical History  Procedure Laterality Date  . Left and right heart catheterization with coronary  angiogram N/A 01/07/2013    Procedure: LEFT AND RIGHT HEART CATHETERIZATION WITH CORONARY ANGIOGRAM;  Surgeon: Birdie Riddle, MD;  Location: [facility] CATH LAB;  Service: Cardiovascular;  Laterality: N/A;    Family History: Family History  Problem Relation Age of Onset  . Heart disease Father 49    MI  . Hypertension Father   . Hyperlipidemia Father   . Diabetes Mother   . Hypertension Mother     Social History: Social History   Social History  . Marital Status: Single    Spouse Name: N/A  . Number of Children: N/A  . Years of Education: N/A   Social History Main Topics  . Smoking status: Never  Smoker   . Smokeless tobacco: None  . Alcohol Use: No  . Drug Use: No  . Sexual Activity: Not Asked   Other Topics Concern  . None   Social History Narrative   Work or School: Engineer, agricultural, homeless Chief of Staff      Home Situation: lives alone      Spiritual Beliefs: Christian      Lifestyle: 15 minutes dialy walking - working up; working on diet             Allergies:  No Known Allergies  Objective:    Vital Signs:   Temp:  [97.4 F (36.3 C)-97.7 F (36.5 C)] 97.4 F (36.3 C) (04/26 0549) Pulse Rate:  [69-88] 69 (04/26 1121) Resp:  [17-18] 18 (04/26 0549) BP: (134-154)/(84-109) 142/109 mmHg (04/26 1121) SpO2:  [98 %-100 %] 100 % (04/26 0549) Weight:  [335 lb 14.4 oz (152.363 kg)] 335 lb 14.4 oz (152.363 kg) (04/26 0549) Last BM Date: 09/27/15  Weight change: Filed Weights   09/26/15 1325 09/27/15 0556 09/28/15 0549  Weight: 344 lb (156.037 kg) 337 lb 12.8 oz (153.225 kg) 335 lb 14.4 oz (152.363 kg)    Intake/Output:   Intake/Output Summary (Last 24 hours) at 09/28/15 1136 Last data filed at 09/28/15 0901  Gross per 24 hour  Intake    720 ml  Output   2525 ml  Net  -1805 ml     Physical Exam: General:  Well appearing. No resp difficulty HEENT: normal Neck: supple. JVP 12 cm. Carotids 2+ bilat; no bruits. No lymphadenopathy or  thyromegaly appreciated. Cor: PMI nondisplaced. Regular rate & rhythm. No rubs, gallops or murmurs. Lungs: CTAB, normal effort.  Abdomen: Morbidly obese, tight, nontender, mild/mod distended. No hepatosplenomegaly. No bruits or masses. Good bowel sounds. Extremities: no cyanosis, clubbing, rash. Chronic 2-3+ edema into thighs.  Neuro: alert & orientedx3, cranial nerves grossly intact. moves all 4 extremities w/o difficulty. Affect pleasant  Telemetry: Reviewed, NSR 80s  Labs: Basic Metabolic Panel:  Recent Labs Lab 09/26/15 0941 09/26/15 1320 09/27/15 0306 09/28/15 0315  NA 141  --  140 142  K 3.3*  --  3.8 3.1*  CL 104  --  99* 102  CO2 23  --  28 27  GLUCOSE 211*  --  156* 151*  BUN 15  --  16 17  CREATININE 1.44*  --  1.52* 1.48*  CALCIUM 8.6*  --  8.6* 8.6*  MG  --  1.7  --   --     Liver Function Tests: No results for input(s): AST, ALT, ALKPHOS, BILITOT, PROT, ALBUMIN in the last 168 hours. No results for input(s): LIPASE, AMYLASE in the last 168 hours. No results for input(s): AMMONIA in the last 168 hours.  CBC:  Recent Labs Lab 09/26/15 0941  WBC 7.9  HGB 13.7  HCT 43.9  MCV 86.6  PLT 209    Cardiac Enzymes:  Recent Labs Lab 09/26/15 1320 09/26/15 1948  TROPONINI 0.15* 0.13*    BNP: BNP (last 3 results)  Recent Labs  09/26/15 0941  BNP 590.2*    ProBNP (last 3 results) No results for input(s): PROBNP in the last 8760 hours.   CBG:  Recent Labs Lab 09/27/15 0555 09/27/15 1126 09/27/15 1644 09/27/15 2049 09/28/15 0545  GLUCAP 164* 174* 146* 135* 135*    Coagulation Studies: No results for input(s): LABPROT, INR in the last 72 hours.  Other results: EKG: 09/26/15 NSR with PACs, 96 bpm  Imaging:  No results found.   Medications:     Current Medications: . amLODipine  5 mg Oral Daily  . carvedilol  6.25 mg Oral BID WC  . furosemide  40 mg Intravenous BID  . insulin aspart  0-15 Units Subcutaneous TID WC  . insulin  aspart  0-5 Units Subcutaneous QHS  . lisinopril  10 mg Oral Daily  . potassium chloride  40 mEq Oral TID     Infusions:      Assessment/Plan   1. Acute on chronic combined CHF - Echo 09/27/2015 20-25%, RV mild dilated, moderately reduced, severe LAE.  NICM likely 2/2 poorly controlled HTN.  Had [place] in 2014 negative for CAD.  EF was 25-30% at that time.  - He remains volume overloaded with chronic, woody edema 2-3 up into his thighs. Increase lasix to 80 mg IV BID with addition 40 mg now.  - Stop lisinopril and change to losartan 50 mg daily in anticipation of switching to Entresto. (will need 36 hr washout from lisinopril so earliest start would be Friday evening) - Likely some of his symptoms and fluid 2/2 to non-compliance. Stressed importance of taking medicines.  - Add spiro 12.5 mg daily.  - Continue coreg 6.25 mg daily - Stop amlodipine - Start Bidil 1 tab TID.  - Will get HIV, SPEP, and UPEP to look at other causes of NICM  2. HTN - will manage in the setting of adjusting his HF meds as above.  3. CKD: Stage III.  Stable this admission.  - Follow closely with BMET 4. Hypokalemia - K supped.  5. DM2 - Poorly controlled with A1C > 8. Will need follow up.  - Will follow closely while increasing diuretics.  6. Elevated troponin: Suspect demand ischemia in setting of volume overload.  No coronary disease on 2014 cath and no chest pain.   Length of Stay:   Shirley Friar PA-C 09/28/2015, 11:36 AM  Advanced Heart Failure Team Pager [phone] (M-F; 7a - 4p)  Please contact [facility] for night-coverage after hours (4p -7a ) and weekends on [email or web address]  Patient seen with PA, agree with the above note.  Nonischemic cardiomyopathy.  He stopped taking most of his meds shortly after last admission in 2014.  He has poorly controlled HTN which seems like the most likely cause of his cardiomyopathy. BP was around 180/120 at admission.  No chest pain, appears to have built up  fluid over time.  He is quite volume overloaded on exam but has been diuresing. Long discussion about medication compliance.  He understands and is willing to follow medical program at this point.  - Increase Lasix to 80 mg IV bid.  - Stop amlodipine and use Bidil 1 tab tid.  - Add spironolactone 12.5 daily.  - Will transition over to [facility] 24/26 bid rather than lisinopril => stop lisinopril today and give losartan tomorrow.  Can start Entresto Friday pm (36 hr lisinopril washout).  - Check HIV/UPEP/SPEP  [name] 09/28/2015 12:27 PM

## 2015-09-28 NOTE — Progress Notes (Signed)
Ref: Lucretia Kern., DO   Subjective:  Feeling better. Leg edema persist. Decreasing diuresis.  Objective:  Vital Signs in the last 24 hours: Temp:  [97.4 F (36.3 C)-97.5 F (36.4 C)] 97.5 F (36.4 C) (04/26 1223) Pulse Rate:  [69-88] 87 (04/26 1223) Cardiac Rhythm:  [-] Normal sinus rhythm (04/26 0705) Resp:  [17-20] 20 (04/26 1223) BP: (134-154)/(84-109) 147/88 mmHg (04/26 1223) SpO2:  [98 %-100 %] 100 % (04/26 1223) Weight:  [152.363 kg (335 lb 14.4 oz)] 152.363 kg (335 lb 14.4 oz) (04/26 0549)  Physical Exam: BP Readings from Last 1 Encounters:  09/28/15 147/88    Wt Readings from Last 1 Encounters:  09/28/15 152.363 kg (335 lb 14.4 oz)    Weight change: 16.284 kg (35 lb 14.4 oz)  HEENT: [place]/AT, Eyes-Brown, PERL, EOMI, Conjunctiva-Pink, Sclera-Non-icteric Neck: + JVD, No bruit, Trachea midline. Lungs:  Clearing, Bilateral. Cardiac:  Regular rhythm, normal S1 and S2, no S3. II/VI systolic murmur. Abdomen:  Soft, non-tender. Extremities:  2 + edema present. No cyanosis. No clubbing. CNS: AxOx3, Cranial nerves grossly intact, moves all 4 extremities. Right handed. Skin: Warm and dry.   Intake/Output from previous day: 04/25 0701 - 04/26 0700 In: 900 [P.O.:900] Out: 2525 [Urine:2525]    Lab Results: BMET    Component Value Date/Time   NA 142 09/28/2015 0315   NA 140 09/27/2015 0306   NA 141 09/26/2015 0941   K 3.1* 09/28/2015 0315   K 3.8 09/27/2015 0306   K 3.3* 09/26/2015 0941   CL 102 09/28/2015 0315   CL 99* 09/27/2015 0306   CL 104 09/26/2015 0941   CO2 27 09/28/2015 0315   CO2 28 09/27/2015 0306   CO2 23 09/26/2015 0941   GLUCOSE 151* 09/28/2015 0315   GLUCOSE 156* 09/27/2015 0306   GLUCOSE 211* 09/26/2015 0941   BUN 17 09/28/2015 0315   BUN 16 09/27/2015 0306   BUN 15 09/26/2015 0941   CREATININE 1.48* 09/28/2015 0315   CREATININE 1.52* 09/27/2015 0306   CREATININE 1.44* 09/26/2015 0941   CALCIUM 8.6* 09/28/2015 0315   CALCIUM 8.6* 09/27/2015  0306   CALCIUM 8.6* 09/26/2015 0941   GFRNONAA 56* 09/28/2015 0315   GFRNONAA 54* 09/27/2015 0306   GFRNONAA 57* 09/26/2015 0941   GFRAA >60 09/28/2015 0315   GFRAA >60 09/27/2015 0306   GFRAA >60 09/26/2015 0941   CBC    Component Value Date/Time   WBC 7.9 09/26/2015 0941   WBC 7.0 01/03/2013 1800   RBC 5.07 09/26/2015 0941   RBC 4.81 01/03/2013 1800   HGB 13.7 09/26/2015 0941   HGB 13.0* 01/03/2013 1800   HCT 43.9 09/26/2015 0941   HCT 42.9* 01/03/2013 1800   PLT 209 09/26/2015 0941   MCV 86.6 09/26/2015 0941   MCV 89.2 01/03/2013 1800   MCH 27.0 09/26/2015 0941   MCH 27.0 01/03/2013 1800   MCHC 31.2 09/26/2015 0941   MCHC 30.3* 01/03/2013 1800   RDW 14.7 09/26/2015 0941   HEPATIC Function Panel No results for input(s): PROT in the last 8760 hours.  Invalid input(s):  ALBUMIN,  AST,  ALT,  ALKPHOS,  BILIDIR,  IBILI HEMOGLOBIN A1C No components found for: HGA1C,  MPG CARDIAC ENZYMES Lab Results  Component Value Date   TROPONINI 0.13* 09/26/2015   TROPONINI 0.15* 09/26/2015   TROPONINI <0.30 01/04/2013   BNP No results for input(s): PROBNP in the last 8760 hours. TSH No results for input(s): TSH in the last 8760 hours. CHOLESTEROL  Recent  Labs  09/26/15 1314  CHOL 139    Scheduled Meds: . carvedilol  6.25 mg Oral BID WC  . enoxaparin (LOVENOX) injection  70 mg Subcutaneous Q24H  . furosemide  80 mg Intravenous BID  . insulin aspart  0-15 Units Subcutaneous TID WC  . insulin aspart  0-5 Units Subcutaneous QHS  . isosorbide-hydrALAZINE  1 tablet Oral TID  . [START ON 09/30/2015] losartan  25 mg Oral Once  . [START ON 09/29/2015] losartan  50 mg Oral Once  . potassium chloride  40 mEq Oral TID  . [START ON 09/30/2015] sacubitril-valsartan  1 tablet Oral BID  . spironolactone  12.5 mg Oral Daily   Continuous Infusions:  PRN Meds:.nitroGLYCERIN  Assessment/Plan: Acute on chronic diastolic heart failure (HCC) CKD (chronic kidney disease) stage 2, GFR  60-89 ml/min Essential hypertension, benign Type 2 diabetes, uncontrolled, with renal manifestation (HCC) Hypokalemia-improving Shortness of breath Bilateral leg edema Obesity Elevated troponin-demand ischemia.   Continue medical treatment. Consider milrinone if lasix is not as effective.     Dixie Dials  MD  09/28/2015, 4:29 PM

## 2015-09-29 LAB — GLUCOSE, CAPILLARY
GLUCOSE-CAPILLARY: 139 mg/dL — AB (ref 65–99)
GLUCOSE-CAPILLARY: 164 mg/dL — AB (ref 65–99)
GLUCOSE-CAPILLARY: 183 mg/dL — AB (ref 65–99)
GLUCOSE-CAPILLARY: 194 mg/dL — AB (ref 65–99)

## 2015-09-29 LAB — BASIC METABOLIC PANEL
ANION GAP: 12 (ref 5–15)
BUN: 19 mg/dL (ref 6–20)
CO2: 25 mmol/L (ref 22–32)
CREATININE: 1.5 mg/dL — AB (ref 0.61–1.24)
Calcium: 8.5 mg/dL — ABNORMAL LOW (ref 8.9–10.3)
Chloride: 104 mmol/L (ref 101–111)
GFR calc non Af Amer: 55 mL/min — ABNORMAL LOW (ref >60)
Glucose, Bld: 153 mg/dL — ABNORMAL HIGH (ref 65–99)
POTASSIUM: 4 mmol/L (ref 3.5–5.1)
SODIUM: 141 mmol/L (ref 135–145)

## 2015-09-29 LAB — PROTEIN ELECTROPHORESIS, SERUM
A/G RATIO SPE: 1 (ref 0.7–1.7)
ALPHA-1-GLOBULIN: 0.2 g/dL (ref 0.0–0.4)
ALPHA-2-GLOBULIN: 0.8 g/dL (ref 0.4–1.0)
Albumin ELP: 3.1 g/dL (ref 2.9–4.4)
BETA GLOBULIN: 1.1 g/dL (ref 0.7–1.3)
GLOBULIN, TOTAL: 3.2 g/dL (ref 2.2–3.9)
Gamma Globulin: 1 g/dL (ref 0.4–1.8)
Total Protein ELP: 6.3 g/dL (ref 6.0–8.5)

## 2015-09-29 LAB — IMMUNOFIXATION, URINE

## 2015-09-29 LAB — HIV ANTIBODY (ROUTINE TESTING W REFLEX): HIV Screen 4th Generation wRfx: NONREACTIVE

## 2015-09-29 LAB — TSH: TSH: 1.664 u[IU]/mL (ref 0.350–4.500)

## 2015-09-29 MED ORDER — FUROSEMIDE 10 MG/ML IJ SOLN
80.0000 mg | Freq: Two times a day (BID) | INTRAMUSCULAR | Status: DC
Start: 2015-09-30 — End: 2015-10-01
  Administered 2015-09-30 – 2015-10-01 (×3): 80 mg via INTRAVENOUS
  Filled 2015-09-29 (×3): qty 8

## 2015-09-29 MED ORDER — METOLAZONE 2.5 MG PO TABS
2.5000 mg | ORAL_TABLET | Freq: Once | ORAL | Status: AC
Start: 2015-09-29 — End: 2015-09-29
  Administered 2015-09-29: 2.5 mg via ORAL
  Filled 2015-09-29: qty 1

## 2015-09-29 MED ORDER — POTASSIUM CHLORIDE CRYS ER 20 MEQ PO TBCR
40.0000 meq | EXTENDED_RELEASE_TABLET | Freq: Two times a day (BID) | ORAL | Status: DC
  Administered 2015-09-29 – 2015-10-01 (×5): 40 meq via ORAL
  Filled 2015-09-29 (×4): qty 2

## 2015-09-29 MED ORDER — FUROSEMIDE 10 MG/ML IJ SOLN
80.0000 mg | Freq: Once | INTRAMUSCULAR | Status: AC
  Administered 2015-09-29: 80 mg via INTRAVENOUS
  Filled 2015-09-29: qty 8

## 2015-09-29 NOTE — Progress Notes (Signed)
Advanced Heart Failure Rounding Note  Referring Physician: Dr. Doylene Canard Primary Physician: Colin Benton, DO.  Primary Cardiologist: Dr. Doylene Canard  Reason for Consultation: A/C diastolic HF  Subjective:    Feels like fluid is coming down.  Peed most of the day yesterday. Hasn't really had SOB since he got here.  Would like to go home tomorrow if possible, may be willing to stay until Saturday morning if necessary.  Out 1.8 L and down 4 lbs on lasix 80 mg IV BID.  Creatinine stable.   Objective:   Weight Range: 331 lb 1.6 oz (150.186 kg) Body mass index is 46.2 kg/(m^2).   Vital Signs:   Temp:  [97.3 F (36.3 C)-98.3 F (36.8 C)] 98.3 F (36.8 C) (04/27 0512) Pulse Rate:  [69-87] 78 (04/27 0512) Resp:  [16-20] 16 (04/27 0512) BP: (130-147)/(81-109) 130/81 mmHg (04/27 0512) SpO2:  [100 %] 100 % (04/27 0512) Weight:  [331 lb 1.6 oz (150.186 kg)] 331 lb 1.6 oz (150.186 kg) (04/27 0512) Last BM Date: 09/28/15  Weight change: Filed Weights   09/27/15 0556 09/28/15 0549 09/29/15 0512  Weight: 337 lb 12.8 oz (153.225 kg) 335 lb 14.4 oz (152.363 kg) 331 lb 1.6 oz (150.186 kg)    Intake/Output:   Intake/Output Summary (Last 24 hours) at 09/29/15 0844 Last data filed at 09/29/15 0716  Gross per 24 hour  Intake   1010 ml  Output   2950 ml  Net  -1940 ml     Physical Exam: General: Well appearing. No resp difficulty HEENT: normal Neck: supple. JVP 10-11 cm cm. Carotids 2+ bilat; no bruits. No thyromegaly or nodule noted.  Cor: PMI nondisplaced. RRR. No M/G/R appreciated Lungs: Clear Abdomen: Morbidly obese, tight, NT, ND, no HSM. No bruits or masses. +BS  Extremities: no cyanosis, clubbing, rash. Chronic 1-2+ edema into thighs.  Neuro: alert & orientedx3, cranial nerves grossly intact. moves all 4 extremities w/o difficulty. Affect pleasant   Telemetry: Reviewed personally, NSR 70-80s  Labs: CBC  Recent Labs  09/26/15 0941  WBC 7.9  HGB 13.7  HCT 43.9  MCV  86.6  PLT [id number]   Basic Metabolic Panel  Recent Labs  09/26/15 1320  09/28/15 0315 09/29/15 0333  NA  --   < > 142 141  K  --   < > 3.1* 4.0  CL  --   < > 102 104  CO2  --   < > 27 25  GLUCOSE  --   < > 151* 153*  BUN  --   < > 17 19  CREATININE  --   < > 1.48* 1.50*  CALCIUM  --   < > 8.6* 8.5*  MG 1.7  --   --   --   < > = values in this interval not displayed. Liver Function Tests No results for input(s): AST, ALT, ALKPHOS, BILITOT, PROT, ALBUMIN in the last 72 hours. No results for input(s): LIPASE, AMYLASE in the last 72 hours. Cardiac Enzymes  Recent Labs  09/26/15 1320 09/26/15 1948  TROPONINI 0.15* 0.13*    BNP: BNP (last 3 results)  Recent Labs  09/26/15 0941  BNP 590.2*    ProBNP (last 3 results) No results for input(s): PROBNP in the last 8760 hours.   D-Dimer  Recent Labs  09/26/15 0930  DDIMER 1.02*   Hemoglobin A1C  Recent Labs  09/26/15 1314  HGBA1C 8.7*   Fasting Lipid Panel  Recent Labs  09/26/15 1314  CHOL 139  HDL 32*  LDLCALC 88  TRIG 96  CHOLHDL 4.3   Thyroid Function Tests  Recent Labs  09/29/15 0328  TSH 1.664    Other results:     Imaging/Studies:   No results found.  Latest Echo  Latest Cath   Medications:     Scheduled Medications: . carvedilol  6.25 mg Oral BID WC  . enoxaparin (LOVENOX) injection  70 mg Subcutaneous Q24H  . furosemide  80 mg Intravenous BID  . insulin aspart  0-15 Units Subcutaneous TID WC  . insulin aspart  0-5 Units Subcutaneous QHS  . isosorbide-hydrALAZINE  1 tablet Oral TID  . [START ON 09/30/2015] losartan  25 mg Oral Once  . losartan  50 mg Oral Once  . potassium chloride  40 mEq Oral TID  . [START ON 09/30/2015] sacubitril-valsartan  1 tablet Oral BID  . spironolactone  12.5 mg Oral Daily     Infusions:     PRN Medications:  nitroGLYCERIN   Assessment/Plan   1. Acute on chronic combined CHF - Echo 09/27/2015 20-25%, RV mild dilated, moderately  reduced, severe LAE. NICM likely 2/2 poorly controlled HTN. Had [place] in 2014 negative for CAD. EF was 25-30% at that time.  - Slightly improved but remains volume overloaded with chronic, woody 1-2+ edema up into his thighs. - Continue lasix 80 mg IV BID and give 2.5 dose of metolazone today.  - Continue losartan 50 mg daily in anticipation of switching to Entresto. (will need 36 hr washout from lisinopril so earliest start would be Friday evening).  - Continue spiro 12.5 mg daily.  - Continue coreg 6.25 mg daily - Continue Bidil 1 tab TID. Will hold on increase with added metolazone today. Pressure in 130s (much improved) and hasn't had his losartan yet today.  - HIV - negative - SPEP and UPEP pending.   2. HTN - will manage in the setting of adjusting his HF meds as above.  3. CKD: Stage III.  - Stable with increase in lasix. Continue to follow with daily BMETs. 4. Hypokalemia - Resolved. Continue to watch with diuresis.  5. DM2 - Poorly controlled with A1C > 8. Will need follow up.  6. Elevated troponin:  - Suspect this was due to demand ischemia in setting of volume overload.  - No coronary disease on 2014 cath and no chest pain.   Length of Stay: 1  Shirley Friar PA-C 09/29/2015, 8:44 AM  Advanced Heart Failure Team Pager [phone] (M-F; 7a - 4p)  Please contact [facility] for night-coverage after hours (4p -7a ) and weekends on [email or web address]  Patient seen with PA, agree with the above note.  Feeling good, breathing improved.  Some diuresis yesterday, weight down.  Still with some volume overload, agree with dose of metolazone today along with IV Lasix.  BP looks better.   [name] 09/29/2015 10:57 AM

## 2015-09-30 LAB — BASIC METABOLIC PANEL
ANION GAP: 14 (ref 5–15)
BUN: 15 mg/dL (ref 6–20)
CALCIUM: 8.8 mg/dL — AB (ref 8.9–10.3)
CO2: 27 mmol/L (ref 22–32)
CREATININE: 1.57 mg/dL — AB (ref 0.61–1.24)
Chloride: 99 mmol/L — ABNORMAL LOW (ref 101–111)
GFR calc Af Amer: 60 mL/min — ABNORMAL LOW (ref >60)
GFR calc non Af Amer: 52 mL/min — ABNORMAL LOW (ref >60)
Glucose, Bld: 134 mg/dL — ABNORMAL HIGH (ref 65–99)
Potassium: 3.2 mmol/L — ABNORMAL LOW (ref 3.5–5.1)
Sodium: 140 mmol/L (ref 135–145)

## 2015-09-30 LAB — GLUCOSE, CAPILLARY
GLUCOSE-CAPILLARY: 181 mg/dL — AB (ref 65–99)
Glucose-Capillary: 128 mg/dL — ABNORMAL HIGH (ref 65–99)
Glucose-Capillary: 195 mg/dL — ABNORMAL HIGH (ref 65–99)
Glucose-Capillary: 171 mg/dL — ABNORMAL HIGH (ref 65–99)

## 2015-09-30 LAB — BRAIN NATRIURETIC PEPTIDE: B Natriuretic Peptide: 224.3 pg/mL — ABNORMAL HIGH (ref 0.0–100.0)

## 2015-09-30 MED ORDER — POTASSIUM CHLORIDE CRYS ER 20 MEQ PO TBCR
40.0000 meq | EXTENDED_RELEASE_TABLET | Freq: Once | ORAL | Status: AC
  Administered 2015-09-30: 40 meq via ORAL
  Filled 2015-09-30: qty 2

## 2015-09-30 MED ORDER — SACUBITRIL-VALSARTAN 24-26 MG PO TABS
1.0000 | ORAL_TABLET | Freq: Two times a day (BID) | ORAL | Status: DC
  Administered 2015-09-30 – 2015-10-01 (×3): 1 via ORAL
  Filled 2015-09-30 (×4): qty 1

## 2015-09-30 MED ORDER — SACUBITRIL-VALSARTAN 24-26 MG PO TABS: 1.0000 | ORAL_TABLET | Freq: Two times a day (BID) | ORAL | Status: DC

## 2015-09-30 MED ORDER — METOLAZONE 2.5 MG PO TABS
2.5000 mg | ORAL_TABLET | Freq: Once | ORAL | Status: AC
  Administered 2015-09-30: 2.5 mg via ORAL
  Filled 2015-09-30: qty 1

## 2015-09-30 NOTE — Progress Notes (Signed)
I gave Richard [name] a map and directions to [facility] along with his appointment time and date.  He inquired about changing his PCP.  I will refer him to Raquel Sarna the A[facility] Outpatient LCSW to assist with this change.

## 2015-09-30 NOTE — Progress Notes (Signed)
Coupon card given to the patient for Entresto and Bidil; Pharmacy of choice is [facility] on [address] department called and they do have the medication in stock. Mindi Slicker [facility] [phone]

## 2015-09-30 NOTE — Progress Notes (Signed)
RE: Benefit check      Richard [name] CMA           Patient has combined Adult nurse) deductible on plan of $3500/ $0 met towards this.   Entresto patient would pay $416.78, until deductible has been met. After this is met patient would pay $35.00   Richard [name] is required [phone]   Patient can use: most major retail pharmacies

## 2015-09-30 NOTE — Progress Notes (Signed)
Nutrition Education Note  RD consulted for nutrition education regarding CHF.  RD provided and discussed "Heart Failure Nutrition Therapy" handout from the Academy of [employer]. Reviewed patient's dietary recall. Provided examples on ways to decrease sodium intake in diet. Discouraged intake of processed foods and use of salt shaker. Encouraged fresh fruits and vegetables as well as whole grain sources of carbohydrates to maximize fiber intake. Provided "20 Ways to Eat More Fruits and Vegetables" handout from the Academy of [employer] and [employer]etics.   RD discussed why it is important for patient to adhere to diet recommendations, and emphasized the role of fluids, foods to avoid, and importance of weighing self daily. Discussed thirst quenching tips. Teach back method used.  Expect good compliance. Pt hs already brainstormed goals. He plans to plan meals in advance on weekends so that he will eat out less. He plans to eat more grilled chicken and vegetables and plans to be more physically active.   Body mass index is 45.6 kg/(m^2). Pt meets criteria for Morbid Obesity based on current BMI.  Current diet order is Heart Healthy/Carb Modified, patient is consuming approximately 100% of meals at this time. Labs and medications reviewed. No further nutrition interventions warranted at this time. RD contact information provided. If additional nutrition issues arise, please re-consult RD.   Scarlette Ar RD, LDN Inpatient Clinical Dietitian Pager: [phone] After Hours Pager: [phone]

## 2015-09-30 NOTE — Progress Notes (Signed)
Advanced Heart Failure Rounding Note  Referring Physician: Dr. Doylene Canard Primary Physician: Colin Benton, DO.  Primary Cardiologist: Dr. Doylene Canard   Subjective:    Brisk diuresis noted. Weight another 5 pounds. Overall he diuresed 18 pounds.   Denies SOB.   Objective:   Weight Range: 326 lb 12.8 oz (148.236 kg) Body mass index is 45.6 kg/(m^2).   Vital Signs:   Temp:  [98 F (36.7 C)-98.4 F (36.9 C)] 98.4 F (36.9 C) (04/28 [id number]) Pulse Rate:  [82-85] 82 (04/28 0614) Resp:  [17-18] 17 (04/28 0614) BP: (110-136)/(55-78) 125/78 mmHg (04/28 0614) SpO2:  [99 %-100 %] 99 % (04/28 0614) Weight:  [326 lb 12.8 oz (148.236 kg)] 326 lb 12.8 oz (148.236 kg) (04/28 0614) Last BM Date: 09/28/15  Weight change: Filed Weights   09/28/15 0549 09/29/15 0512 09/30/15 0614  Weight: 335 lb 14.4 oz (152.363 kg) 331 lb 1.6 oz (150.186 kg) 326 lb 12.8 oz (148.236 kg)    Intake/Output:   Intake/Output Summary (Last 24 hours) at 09/30/15 0739 Last data filed at 09/30/15 [id number]  Gross per 24 hour  Intake   1200 ml  Output   4900 ml  Net  -3700 ml     Physical Exam: General: Well appearing. No resp difficulty. In the chair.  HEENT: normal Neck: supple. JVP 11-12 .  Carotids 2+ bilat; no bruits. No thyromegaly or nodule noted.  Cor: PMI nondisplaced. RRR. No M/G/R appreciated Lungs: Clear Abdomen: Morbidly obese, tight, NT, ND, no HSM. No bruits or masses. +BS  Extremities: no cyanosis, clubbing, rash. R and LLE 2+ edema multiple tatoos  Neuro: alert & orientedx3, cranial nerves grossly intact. moves all 4 extremities w/o difficulty. Affect pleasant   Telemetry: NSR 80s   Labs: CBC No results for input(s): WBC, NEUTROABS, HGB, HCT, MCV, PLT in the last 72 hours. Basic Metabolic Panel  Recent Labs  09/29/15 0333 09/30/15 0237  NA 141 140  K 4.0 3.2*  CL 104 99*  CO2 25 27  GLUCOSE 153* 134*  BUN 19 15  CREATININE 1.50* 1.57*  CALCIUM 8.5* 8.8*   Liver Function  Tests No results for input(s): AST, ALT, ALKPHOS, BILITOT, PROT, ALBUMIN in the last 72 hours. No results for input(s): LIPASE, AMYLASE in the last 72 hours. Cardiac Enzymes No results for input(s): CKTOTAL, CKMB, CKMBINDEX, TROPONINI in the last 72 hours.  BNP: BNP (last 3 results)  Recent Labs  09/26/15 0941 09/30/15 0237  BNP 590.2* 224.3*    ProBNP (last 3 results) No results for input(s): PROBNP in the last 8760 hours.   D-Dimer No results for input(s): DDIMER in the last 72 hours. Hemoglobin A1C No results for input(s): HGBA1C in the last 72 hours. Fasting Lipid Panel No results for input(s): CHOL, HDL, LDLCALC, TRIG, CHOLHDL, LDLDIRECT in the last 72 hours. Thyroid Function Tests  Recent Labs  09/29/15 0328  TSH 1.664    Other results:     Imaging/Studies:  No results found.  Latest Echo  Latest Cath   Medications:     Scheduled Medications: . carvedilol  6.25 mg Oral BID WC  . enoxaparin (LOVENOX) injection  70 mg Subcutaneous Q24H  . furosemide  80 mg Intravenous BID  . insulin aspart  0-15 Units Subcutaneous TID WC  . insulin aspart  0-5 Units Subcutaneous QHS  . isosorbide-hydrALAZINE  1 tablet Oral TID  . losartan  25 mg Oral Once  . potassium chloride  40 mEq Oral BID  . sacubitril-valsartan  1 tablet Oral BID  . spironolactone  12.5 mg Oral Daily    Infusions:    PRN Medications: nitroGLYCERIN   Assessment/Plan   1. Acute on chronic combined CHF - Echo 09/27/2015 20-25%, RV mild dilated, moderately reduced, severe LAE. NICM likely 2/2 poorly controlled HTN. Had [facility] in 2014 negative for CAD. EF was 25-30% at that time.  - Volume status improving but still need more diuresis. Continue IV lasix 80 mg twice a day + 2.5 mg metolazone. Renal function stable. Anticipate switching to po lasix tomorrow 40 mg twice a day. (Prior to admit he was on 20 mg lasix daily)  - Stop losartan.  Last dose of lisinopril was 4/26 at 1100 so he has  had appropriate wash out.  Start entresto 24-26 mg twice a day.  - Continue spiro 12.5 mg daily.  - Continue coreg 6.25 mg daily - Continue Bidil 1 tab TID.  - HIV negative - SPEP and UPEP negative.  TSH normal.   2. HTN - Better controlled.   3. CKD: Stage III.  - Stable. Follow renal function.  4. Hypokalemia - K 3.2 Supplement K.  5. DM2 - Poorly controlled with A1C > 8. Will need follow up.  6. Elevated troponin:  - Suspect this was due to demand ischemia in setting of volume overload.  - No coronary disease on 2014 cath and no chest pain.   Consult cardiac rehab and dietitian. Lengthy discussion about daily weights, low salt food choices, and limiting fluids to < 2 liters per day. Follow up in HF clinic 5/42017 at  2:30   He is agreeable to one more day to adequately diurese.   Length of Stay: 2  Amy Clegg NP-C  09/30/2015, 7:39 AM  Advanced Heart Failure Team Pager [phone] (M-F; [place])  Please contact [facility] for night-coverage after hours (4p -7a ) and weekends on [email or web address]  Patient seen with NP, agree with the above note.  Creatinine stable today, good diuresis but still volume overloaded.  Will continue IV Lasix + metolazone one more day.  Will start Entresto today.  Probably home on Lasix 40 mg po bid tomorrow.   He has followup with me in CHF clinic.   [name] 09/30/2015 8:19 AM

## 2015-10-01 LAB — BASIC METABOLIC PANEL
Anion gap: 13 (ref 5–15)
BUN: 15 mg/dL (ref 6–20)
CALCIUM: 9.1 mg/dL (ref 8.9–10.3)
CHLORIDE: 96 mmol/L — AB (ref 101–111)
CO2: 29 mmol/L (ref 22–32)
Creatinine, Ser: 1.49 mg/dL — ABNORMAL HIGH (ref 0.61–1.24)
GFR calc non Af Amer: 55 mL/min — ABNORMAL LOW (ref >60)
GLUCOSE: 138 mg/dL — AB (ref 65–99)
Potassium: 3.2 mmol/L — ABNORMAL LOW (ref 3.5–5.1)
Sodium: 138 mmol/L (ref 135–145)

## 2015-10-01 LAB — GLUCOSE, CAPILLARY
GLUCOSE-CAPILLARY: 168 mg/dL — AB (ref 65–99)
Glucose-Capillary: 153 mg/dL — ABNORMAL HIGH (ref 65–99)

## 2015-10-01 MED ORDER — ONETOUCH DELICA LANCETS 33G MISC: 1.0000 | Freq: Every day | Status: DC

## 2015-10-01 MED ORDER — POTASSIUM CHLORIDE 20 MEQ/15ML (10%) PO SOLN
40.0000 meq | Freq: Once | ORAL | Status: AC
  Administered 2015-10-01: 40 meq via ORAL

## 2015-10-01 MED ORDER — SPIRONOLACTONE 25 MG PO TABS: 12.5000 mg | ORAL_TABLET | Freq: Every day | ORAL | Status: DC

## 2015-10-01 MED ORDER — ISOSORB DINITRATE-HYDRALAZINE 20-37.5 MG PO TABS: 1.0000 | ORAL_TABLET | Freq: Three times a day (TID) | ORAL | Status: DC

## 2015-10-01 MED ORDER — POTASSIUM CHLORIDE CRYS ER 20 MEQ PO TBCR: 40.0000 meq | EXTENDED_RELEASE_TABLET | Freq: Two times a day (BID) | ORAL | Status: DC

## 2015-10-01 MED ORDER — GLUCOSE BLOOD VI STRP: ORAL_STRIP | Status: DC

## 2015-10-01 MED ORDER — CARVEDILOL 6.25 MG PO TABS: 6.2500 mg | ORAL_TABLET | Freq: Two times a day (BID) | ORAL | Status: DC

## 2015-10-01 MED ORDER — SACUBITRIL-VALSARTAN 24-26 MG PO TABS: 1.0000 | ORAL_TABLET | Freq: Two times a day (BID) | ORAL | Status: DC

## 2015-10-01 MED ORDER — FUROSEMIDE 40 MG PO TABS: 40.0000 mg | ORAL_TABLET | Freq: Two times a day (BID) | ORAL | Status: DC

## 2015-10-01 NOTE — Discharge Summary (Signed)
Discharge Summary    Patient ID: Richard [name],  MRN: [id number], DOB/AGE: 1971/04/22 45 y.o.  Admit date: 09/26/2015 Discharge date: 10/01/2015  Primary Care Provider: Lucretia Kern Primary Cardiologist: Dr. Doylene Canard and Dr. Aundra Dubin  Discharge Diagnoses    Principal Problem:   Acute on chronic systolic (congestive) heart failure [facility]) Active Problems:   CKD (chronic kidney disease) stage 2, GFR 60-89 ml/min   Essential hypertension, benign   Type 2 diabetes, uncontrolled, with renal manifestation (HCC)   Hypokalemia   Shortness of breath   Bilateral leg edema   Obesity   Elevated troponin   Diastolic dysfunction   Allergies No Known Allergies  Diagnostic Studies/Procedures    LE venous U/S 09/27/2015 Summary:  - Technically difficult due to edema and body habitus. - No evidence of deep vein thrombosis involving the visualized  veins of the right lower extremity. - No evidence of deep vein thrombosis involving the visualized  veins of the left lower extremity. - Unable to visualize the the peroneal veins bilaterally due to  edema and body habitis. - No evidence of Baker&'s cyst on the right or left.   Echo 09/27/2015 LV EF: 20% - 25%  ------------------------------------------------------------------- Indications: Chest pain 786.51.  ------------------------------------------------------------------- History: PMH: Acute on chronic diastolic heart failure. Bilateral lower extremity edema. CKD II. Hypertension. Diabetes. Hypokalemia. Obesity. Elevated troponin.  ------------------------------------------------------------------- Study Conclusions  - Left ventricle: The cavity size was mildly dilated. There was  mild concentric hypertrophy. Systolic function was severely  reduced. The estimated ejection fraction was in the range of 20%  to 25%. There is severe hypokinesis of the entire myocardium.  Doppler parameters are consistent  with abnormal left ventricular  relaxation (grade 1 diastolic dysfunction). - Mitral valve: There was mild regurgitation. - Left atrium: The atrium was severely dilated. - Right ventricle: The cavity size was mildly dilated. Wall  thickness was normal. Systolic function was moderately reduced. - Right atrium: The atrium was severely dilated. _____________   History of Present Illness     Richard [name] is a very pleasant 45 y.o. male with a past medical history that includes diabetes, diastolic heart failure, hypertension, chronic kidney disease stage II presents to emergency Department chief complaint sudden onset shortness of breath persistent lower extremity edema. Initial evaluation concerning for acute on chronic diastolic heart failure, elevated troponin, acute on chronic renal failure.  Information is obtained from the patient. He reports noncompliance with his home medications particularly antihypertensives and Lasix. he reports intermittent bilateral lower extremity edema over the last several weeks. He he states as a result he has been taken his Lasix every day. His other medications he takes "every now and then to make him last longer". This morning he awakened with sudden worsening shortness of breath with exertion. He reports becoming very short of breath while taking a shower this morning. Denies chest pain palpitations, dizziness syncope or near-syncope. Associated symptoms do include worsening lower extremity edema with the right being worse than the left. He denies any orthopnea. He denies recent travel. His abdominal pain nausea vomiting diarrhea. He denies dysuria hematuria frequency or urgency.  Emergency department is afebrile hemodynamically stable and not hypoxic.  Hospital Course     Echocardiogram obtained on 09/27/2015 showed EF 20-25%, severe hypokinesis of the entire myocardium, grade 1 diastolic dysfunction, mild MR, severe left atrial dilatation, severe right atrial  dilatation. Lower extremity venous Doppler obtained on the same day showed no DVT. Heart failure was consulted on 4/26  as patient had decreased urine output despite Lasix. He was seen by Dr. Aundra Dubin on 4/26, he was still felt to be fluid overloaded, his Lasix was increased to 80 mg twice a day. His lisinopril was stopped and changed to losartan in anticipation of switching to eentresto at a later time. Spironolactone was added. Amlodipine was stopped. And he was started on BiDil. He was given additional dose of metolazone to help with IV diuresis. He had good diuresis with loss of 16 L. His weight dropped from 443 pounds on arrival down to 417 pounds on 4/29.  He was seen in the morning of 4/29, at which time he was stable from cardiac perspective without significant shortness of breath. He is deemed stable for discharge. Outpatient follow-up with heart failure service has been arranged. He is currently on Lasix by mouth 40 mg twice a day. I have discussed with the patient the need to be compliant with medication, we also discussed sodium and fluid restriction and weight check. _____________  Discharge Vitals Blood pressure 104/66, pulse 89, temperature 97.6 F (36.4 C), temperature source Oral, resp. rate 18, height 5' 11"  (1.803 m), weight 317 lb 6.4 oz (143.972 kg), SpO2 99 %.  Filed Weights   09/29/15 0512 09/30/15 0614 10/01/15 0529  Weight: 331 lb 1.6 oz (150.186 kg) 326 lb 12.8 oz (148.236 kg) 317 lb 6.4 oz (143.972 kg)    Labs & Radiologic Studies     CBC No results for input(s): WBC, NEUTROABS, HGB, HCT, MCV, PLT in the last 72 hours. Basic Metabolic Panel  Recent Labs  09/30/15 0237 10/01/15 0544  NA 140 138  K 3.2* 3.2*  CL 99* 96*  CO2 27 29  GLUCOSE 134* 138*  BUN 15 15  CREATININE 1.57* 1.49*  CALCIUM 8.8* 9.1   Thyroid Function Tests  Recent Labs  09/29/15 0328  TSH 1.664    Ct Angio Chest Pe W/cm &/or Wo Cm  09/26/2015  CLINICAL DATA:  Shortness of Breath  EXAM: CT ANGIOGRAPHY CHEST WITH CONTRAST TECHNIQUE: Multidetector CT imaging of the chest was performed using the standard protocol during bolus administration of intravenous contrast. Multiplanar CT image reconstructions and MIPs were obtained to evaluate the vascular anatomy. CONTRAST:  100 cc Isovue COMPARISON:  None. FINDINGS: Mediastinum/Lymph Nodes: There are streaky artifacts from patient's large body habitus. No pulmonary embolus is noted. There is borderline cardiomegaly. No pericardial effusion. No mediastinal hematoma or adenopathy. There is no hilar adenopathy. Small nonspecific mediastinal lymph nodes are noted. No axillary adenopathy. Lungs/Pleura: There is small right pleural effusion. No segmental infiltrate. Mild perihilar interstitial prominence bilaterally suspicious for mild interstitial edema. There is no bronchiectasis. No bronchitic changes are noted. Trace left pleural effusion with left base posterior atelectasis. Upper abdomen: Mild fatty infiltration of the liver. No adrenal gland mass is noted in visualized upper abdomen. Visualized tail of the pancreas and spleen is unremarkable. No focal hepatic mass. Musculoskeletal: No destructive rib lesions are noted. Sagittal images of the spine shows mild degenerative changes thoracic spine. Review of the MIP images confirms the above findings. IMPRESSION: 1. No pulmonary embolus is noted. Suboptimal study due to streak artifacts from patient's large body habitus. 2. No mediastinal hematoma or adenopathy.  No hilar adenopathy. 3. There is small right pleural effusion. Trace left pleural effusion. Mild atelectasis bilateral lower lobe posteriorly. Mild perihilar interstitial prominence bilateral suspicious for mild interstitial edema. No segmental infiltrate. 4. Mild degenerative changes thoracic spine. Electronically Signed   By: Julien Girt  Pop M.D.   On: 09/26/2015 11:43   Dg Chest Port 1 View  09/26/2015  CLINICAL DATA:  Shortness of breath,  onset this morning. EXAM: PORTABLE CHEST 1 VIEW COMPARISON:  01/23/2013 FINDINGS: Cardiomegaly with vascular congestion and interstitial prominence, likely interstitial edema. No confluent opacities or effusions. IMPRESSION: Diffuse interstitial prominence, likely mild interstitial edema. Electronically Signed   By: Rolm Baptise M.D.   On: 09/26/2015 10:35    Disposition   Pt is being discharged home today in good condition.  Follow-up Plans & Appointments    Follow-up Information    Follow up with [name], MD On 10/06/2015.   Specialty:  Cardiology   Why:  at 2:30 [place] information:   [address] [place] [address] [phone]       Follow up with Birdie Riddle, MD.   Specialty:  Cardiology   Why:  Please followup with Dr. Doylene Canard after you followup with Dr. Lavell Islam information:   [place] [address] [phone]      Discharge Instructions    Diet - low sodium heart healthy    Complete by:  As directed      Increase activity slowly    Complete by:  As directed            Discharge Medications   Current Discharge Medication List    START taking these medications   Details  isosorbide-hydrALAZINE (BIDIL) 20-37.5 MG tablet Take 1 tablet by mouth 3 (three) times daily. Qty: 90 tablet, Refills: 3    potassium chloride SA (K-DUR,KLOR-CON) 20 MEQ tablet Take 2 tablets (40 mEq total) by mouth 2 (two) times daily. Qty: 60 tablet, Refills: 4    sacubitril-valsartan (ENTRESTO) 24-26 MG Take 1 tablet by mouth 2 (two) times daily. Qty: 60 tablet, Refills: 11    spironolactone (ALDACTONE) 25 MG tablet Take 0.5 tablets (12.5 mg total) by mouth daily. Qty: 30 tablet, Refills: 5      CONTINUE these medications which have CHANGED   Details  carvedilol (COREG) 6.25 MG tablet Take 1 tablet (6.25 mg total) by mouth 2 (two) times daily with a meal. Qty: 60 tablet, Refills: 11   Associated  Diagnoses: Essential hypertension, benign    furosemide (LASIX) 40 MG tablet Take 1 tablet (40 mg total) by mouth 2 (two) times daily. Qty: 60 tablet, Refills: 5   Associated Diagnoses: Chronic congestive heart failure, unspecified congestive heart failure type ([facility])      CONTINUE these medications which have NOT CHANGED   Details  Blood Glucose Monitoring Suppl (ACURA BLOOD GLUCOSE METER) W/DEVICE KIT 1 meter with lancets and test strips for daily BS testing QS 1 month Qty: 1 kit, Refills: 0    magnesium 30 MG tablet Take 30 mg by mouth 2 (two) times daily.    metFORMIN (GLUCOPHAGE) 1000 MG tablet Take 1 tablet (1,000 mg total) by mouth 2 (two) times daily with a meal. Qty: 180 tablet, Refills: 3   Associated Diagnoses: Type 2 diabetes, uncontrolled, with renal manifestation (HCC)    Multiple Vitamin (MULTIVITAMIN WITH MINERALS) TABS tablet Take 1 tablet by mouth daily.    Omega-3 Fatty Acids (FISH OIL) 1000 MG CAPS Take 1 capsule by mouth daily.    ONETOUCH DELICA LANCETS 35K MISC 1 Device by Other route daily. Use as directed for glucose testing once a day. Qty: 100 each, Refills: 3   Associated Diagnoses: Type 2  diabetes, uncontrolled, with renal manifestation (HCC)    glucose blood (ONE TOUCH ULTRA TEST) test strip Use as directed to check blood sugar once a day. Qty: 100 each, Refills: 3   Associated Diagnoses: Type 2 diabetes, uncontrolled, with renal manifestation (HCC)      STOP taking these medications     amLODipine (NORVASC) 5 MG tablet      potassium chloride (K-DUR) 10 MEQ tablet      aspirin EC 81 MG EC tablet      lisinopril (PRINIVIL,ZESTRIL) 10 MG tablet      pravastatin (PRAVACHOL) 40 MG tablet           Outstanding Labs/Studies   None  Duration of Discharge Encounter   Greater than 30 minutes including physician time.  Signed, Almyra Deforest PA-C 10/01/2015, 1:55 PM

## 2015-10-01 NOTE — Progress Notes (Signed)
Pt d/c'd home. D/c instructions and Rx given to and d'w pt. Pt verbalizes understanding

## 2015-10-01 NOTE — Discharge Instructions (Signed)
Heart Failure Prevention Plan:  1. Avoid salt 2. Limit daily fluid intake to less than 2 liters 3. Weigh yourself every morning and call cardiology if weight increase by more than 3 lbs overnight or 5lbs in a single week

## 2015-10-01 NOTE — Progress Notes (Signed)
Subjective:  Shortness of breath is much better and he is currently anxious to go home.  Good diuresis.  Still hypokalemic today.  Objective:  Vital Signs in the last 24 hours: BP 128/71 mmHg  Pulse 84  Temp(Src) 97.8 F (36.6 C) (Oral)  Resp 18  Ht 5\' 11"  (1.803 m)  Wt 143.972 kg (317 lb 6.4 oz)  BMI 44.29 kg/m2  SpO2 97%  Physical Exam: Pleasant black male in no acute distress severely obese Lungs:  Clear  Cardiac:  Regular rhythm, normal S1 and S2, no S3 Abdomen:  Soft, nontender, no masses Extremities:  Trace  edema present  Intake/Output from previous day: 04/28 0701 - 04/29 0700 In: 1320 [P.O.:1320] Out: 6000 [Urine:6000] Weight Filed Weights   09/29/15 0512 09/30/15 0614 10/01/15 0529  Weight: 150.186 kg (331 lb 1.6 oz) 148.236 kg (326 lb 12.8 oz) 143.972 kg (317 lb 6.4 oz)    Lab Results: Basic Metabolic Panel:  Recent Labs  09/30/15 0237 10/01/15 0544  NA 140 138  K 3.2* 3.2*  CL 99* 96*  CO2 27 29  GLUCOSE 134* 138*  BUN 15 15  CREATININE 1.57* 1.49*   BNP    Component Value Date/Time   BNP 224.3* 09/30/2015 0237    PROTIME: Lab Results  Component Value Date   INR 1.10 01/07/2013    Telemetry: Sinus rhythm.  Assessment/Plan:  1.  Acute on chronic systolic congestive heart failure clinically improved with 26 pound diuresis since in here 2.  Hypokalemia  Recommendations:  Additional potassium this morning and will need another dose this afternoon.  Okay to go home today but will need to be discharged on potassium as well as Lasix 40 mg twice a day.  Follow-up next week early in the heart failure clinic.  He asked about resuming cardio exercise and asked him to defer this until he is seen back in the clinic.     Kerry Hough  MD [facility]  10/01/2015, 9:00 AM

## 2015-10-03 NOTE — Progress Notes (Signed)
UR completed as requested. Mindi Slicker [facility] [phone]

## 2015-10-05 NOTE — Progress Notes (Signed)
Patient ID: Richard [name], male   DOB: [birth year], 45 y.o.   MRN: [id number]    Advanced Heart Failure Clinic Note    Primary Care: Colin Benton, DO Primary Cardiology: Dr. Doylene Canard Primary HF: Dr. Aundra Dubin   HPI:  Richard [name] is a 45 y.o. male with a past medical history that includes diabetes, chronic combined heart failure, hypertension, chronic kidney disease stage II.  Admitted from [facility] 09/26/15 with worsening SOB and LE edema. He reported medical non-compliance on his lasix regimen. Diuresed well, Down 14 lbs with discharge weight of 317 lbs.   Echo 09/27/2015 20-25%, grade 1 DD, RV mild dilated, moderately reduced, severe RAE.   He presents today for post hospital follow up. Down 7 lbs since discharge. Has been feeling good. Has walked around [employer] without any DOE. No DOE on flat ground. No orthopnea, PND, or CP. Has been sleeping better.  Watching salt, doesn't add any and uses non salt seasoning. Drinking < 2 L ( 46-52 oz) daily. He gets occasionally lightheaded and dizzy after taking his medicine, but not marked or limiting. Wants to get back into exercising. Hasn't started his full time job, but has done his part time job.  Job is mostly sedentary and he would like to restart next week.   Social: Works 2 jobs. 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   Past Medical History  Diagnosis Date  . Allergy   . Hypertension   . Obesity   . CHF (congestive heart failure) (HCC)     systolic  . Chronic kidney disease   . Acute decompensated heart failure ([place]) 01/03/2013  . CKD (chronic kidney disease) stage 2, GFR 60-89 ml/min 01/04/2013  . DM (diabetes mellitus) ([place]) 01/06/2013  . Hypertensive emergency 01/03/2013  . Acute decompensated heart failure ([place]) 01/03/2013  . H/O diastolic dysfunction     Current Outpatient Prescriptions  Medication Sig Dispense Refill  . Blood Glucose Monitoring Suppl (ACURA BLOOD GLUCOSE METER) W/DEVICE KIT 1  meter with lancets and test strips for daily BS testing QS 1 month 1 kit 0  . carvedilol (COREG) 6.25 MG tablet Take 1 tablet (6.25 mg total) by mouth 2 (two) times daily with a meal. 60 tablet 11  . furosemide (LASIX) 40 MG tablet Take 1 tablet (40 mg total) by mouth 2 (two) times daily. 60 tablet 5  . glucose blood (ONE TOUCH ULTRA TEST) test strip Use as directed to check blood sugar once a day. 100 each 3  . isosorbide-hydrALAZINE (BIDIL) 20-37.5 MG tablet Take 1 tablet by mouth 3 (three) times daily. 90 tablet 3  . magnesium 30 MG tablet Take 30 mg by mouth 2 (two) times daily.    . metFORMIN (GLUCOPHAGE) 1000 MG tablet Take 1 tablet (1,000 mg total) by mouth 2 (two) times daily with a meal. 180 tablet 3  . Multiple Vitamin (MULTIVITAMIN WITH MINERALS) TABS tablet Take 1 tablet by mouth daily.    . Omega-3 Fatty Acids (FISH OIL) 1000 MG CAPS Take 1 capsule by mouth daily.    Glory Rosebush DELICA LANCETS 42H MISC 1 Device by Other route daily. Use as directed for glucose testing once a day. 100 each 3  . potassium chloride SA (K-DUR,KLOR-CON) 20 MEQ tablet Take 2 tablets (40 mEq total) by mouth 2 (two) times daily. 60 tablet 4  . sacubitril-valsartan (ENTRESTO) 24-26 MG Take 1 tablet by mouth 2 (two) times daily. 60 tablet 11  .  spironolactone (ALDACTONE) 25 MG tablet Take 0.5 tablets (12.5 mg total) by mouth daily. 30 tablet 5   No current facility-administered medications for this encounter.    No Known Allergies    Social History   Social History  . Marital Status: Single    Spouse Name: N/A  . Number of Children: N/A  . Years of Education: N/A   Occupational History  . Not on file.   Social History Main Topics  . Smoking status: Never Smoker   . Smokeless tobacco: Not on file  . Alcohol Use: No  . Drug Use: No  . Sexual Activity: Not on file   Other Topics Concern  . Not on file   Social History Narrative   Work or School: Engineer, agricultural, homeless Human resources officer      Home Situation: lives alone      Spiritual Beliefs: Christian      Lifestyle: 15 minutes dialy walking - working up; working on diet               Family History  Problem Relation Age of Onset  . Heart disease Father 10    MI  . Hypertension Father   . Hyperlipidemia Father   . Diabetes Mother   . Hypertension Mother     [name] Vitals:   10/06/15 1436  BP: 138/96  Pulse: 94  Weight: 310 lb 6.4 oz (140.797 kg)  SpO2: 98%   Wt Readings from Last 3 Encounters:  10/06/15 310 lb 6.4 oz (140.797 kg)  10/01/15 317 lb 6.4 oz (143.972 kg)  04/23/14 320 lb 8 oz (145.378 kg)     PHYSICAL EXAM: General:  Well appearing. No respiratory difficulty HEENT: normal Neck: supple. JVD. Carotids 2+ bilat; no bruits. No thyromegaly or nodule noted.  Cor: PMI nondisplaced. RRR. No M/G/R Lungs: CTAB, normal effort Abdomen: soft, NT, ND, no HSM. No bruits or masses. +BS  Extremities: no cyanosis, clubbing, rash, edema Neuro: alert & oriented x 3, cranial nerves grossly intact. moves all 4 extremities w/o difficulty. Affect pleasant.   ASSESSMENT & PLAN:  1. Chronic combined CHF - Echo 09/27/2015 20-25%, RV mild dilated, moderately reduced, severe LAE. NICM likely 2/2 poorly controlled HTN. Had [place] in 2014 negative for CAD. EF was 25-30% at that time.  - NYHA class II symptoms. Doing much better - Volume status much improved. Continue lasix 40 mg BID   - Continue Entresto 24-26 mg BID.  - Likely some of his symptoms and fluid 2/2 to non-compliance. Stressed importance of taking medicines.  - Continue spiro 12.5 mg daily.  - Increase coreg 9.375 mg BID starting with qhs dose. Increase to full BID dosing as tolerated.  - Continue Bidil 1 tab TID.  - HIV negative, SPEP negative, no M spike. UPEP negative. 2. HTN - Mildly elevated. Med changes as above. 3. CKD: Stage III.  - Recheck BMET today with recent med changes 4. Hypokalemia - BMET today. Will adjust as  needed.  5. DM2 - Poorly controlled with A1C > 8. - Has PCP follow up tomorrow.   Med changes and labs as above. Follow up 2 weeks for med-titration. Will have see MD after next visit.   Mr Valenza would like for [place] to assume care from Dr. Doylene Canard (primarily so that Epic can communicate between his cardiologist and PCP).  We will try to get his records.  Shirley Friar, PA-C 10/06/2015     Total time spent >25 minutes.  Over half that spent discussing the above.

## 2015-10-06 ENCOUNTER — Ambulatory Visit (HOSPITAL_COMMUNITY)
Admit: 2015-10-06 | Discharge: 2015-10-06 | Disposition: A | Discharge status: Home / Self Care | Payer: BLUE CROSS/BLUE SHIELD | Source: Ambulatory Visit | Attending: Cardiology | Admitting: Cardiology

## 2015-10-06 ENCOUNTER — Encounter (HOSPITAL_COMMUNITY): Payer: Self-pay | Admitting: Cardiology

## 2015-10-06 ENCOUNTER — Encounter: Payer: Self-pay | Admitting: Licensed Clinical Social Worker

## 2015-10-06 VITALS — BP 138/96 | HR 94 | Wt 310.4 lb

## 2015-10-06 DIAGNOSIS — N1832 Chronic kidney disease, stage 3b: Secondary | ICD-10-CM | POA: Insufficient documentation

## 2015-10-06 DIAGNOSIS — Z8249 Family history of ischemic heart disease and other diseases of the circulatory system: Secondary | ICD-10-CM | POA: Diagnosis not present

## 2015-10-06 DIAGNOSIS — Z7984 Long term (current) use of oral hypoglycemic drugs: Secondary | ICD-10-CM | POA: Diagnosis not present

## 2015-10-06 DIAGNOSIS — Z9119 Patient's noncompliance with other medical treatment and regimen: Secondary | ICD-10-CM | POA: Insufficient documentation

## 2015-10-06 DIAGNOSIS — N183 Chronic kidney disease, stage 3 unspecified: Secondary | ICD-10-CM

## 2015-10-06 DIAGNOSIS — I5042 Chronic combined systolic (congestive) and diastolic (congestive) heart failure: Secondary | ICD-10-CM | POA: Insufficient documentation

## 2015-10-06 DIAGNOSIS — E1122 Type 2 diabetes mellitus with diabetic chronic kidney disease: Secondary | ICD-10-CM | POA: Insufficient documentation

## 2015-10-06 DIAGNOSIS — Z79899 Other long term (current) drug therapy: Secondary | ICD-10-CM | POA: Insufficient documentation

## 2015-10-06 DIAGNOSIS — E876 Hypokalemia: Secondary | ICD-10-CM | POA: Insufficient documentation

## 2015-10-06 DIAGNOSIS — I10 Essential (primary) hypertension: Secondary | ICD-10-CM | POA: Diagnosis not present

## 2015-10-06 DIAGNOSIS — I130 Hypertensive heart and chronic kidney disease with heart failure and stage 1 through stage 4 chronic kidney disease, or unspecified chronic kidney disease: Secondary | ICD-10-CM | POA: Insufficient documentation

## 2015-10-06 DIAGNOSIS — E1165 Type 2 diabetes mellitus with hyperglycemia: Secondary | ICD-10-CM | POA: Insufficient documentation

## 2015-10-06 DIAGNOSIS — E669 Obesity, unspecified: Secondary | ICD-10-CM | POA: Insufficient documentation

## 2015-10-06 DIAGNOSIS — IMO0002 Reserved for concepts with insufficient information to code with codable children: Secondary | ICD-10-CM

## 2015-10-06 DIAGNOSIS — Z833 Family history of diabetes mellitus: Secondary | ICD-10-CM | POA: Insufficient documentation

## 2015-10-06 LAB — BASIC METABOLIC PANEL
Anion gap: 11 (ref 5–15)
BUN: 23 mg/dL — AB (ref 6–20)
CALCIUM: 9.5 mg/dL (ref 8.9–10.3)
CO2: 23 mmol/L (ref 22–32)
CREATININE: 1.56 mg/dL — AB (ref 0.61–1.24)
Chloride: 103 mmol/L (ref 101–111)
GFR calc non Af Amer: 52 mL/min — ABNORMAL LOW (ref >60)
Glucose, Bld: 117 mg/dL — ABNORMAL HIGH (ref 65–99)
Potassium: 4.1 mmol/L (ref 3.5–5.1)
SODIUM: 137 mmol/L (ref 135–145)

## 2015-10-06 MED ORDER — CARVEDILOL 6.25 MG PO TABS: 9.3750 mg | ORAL_TABLET | Freq: Two times a day (BID) | ORAL | Status: DC

## 2015-10-06 NOTE — Patient Instructions (Signed)
INCREASE Carvedilol  To 9.375 mg (one and one half tabs) at bed time for one week, then increase to twice a day  Labs today  Your physician recommends that you schedule a follow-up appointment in: 2 weeks In the [place] physician recommends that you schedule a follow-up appointment in: 6 weeks with Dr.McLean  Do the following things EVERYDAY: 1) Weigh yourself in the morning before breakfast. Write it down and keep it in a log. 2) Take your medicines as prescribed 3) Eat low salt foods-Limit salt (sodium) to 2000 mg per day.  4) Stay as active as you can everyday 5) Limit all fluids for the day to less than 2 liters 6)

## 2015-10-06 NOTE — Progress Notes (Signed)
CSW referred for PCP. CSW met with patient in the clinic. Patient reports he has appointment tomorrow at [facility]. CSW explained that he has primary care and that office is able to see notes in EPIC from Heart Failure clinic appointment today. Patient was grateful that care can be coordinated via EMR. CSW offered support and encouraged patient to return or call with any further questions. Raquel Sarna, LCSW [phone]

## 2015-10-06 NOTE — Progress Notes (Signed)
Advanced Heart Failure Medication Review by a Pharmacist  Does the patient  feel that his/her medications are working for him/her?  yes  Has the patient been experiencing any side effects to the medications prescribed?  no  Does the patient measure his/her own blood pressure or blood glucose at home?  yes   Does the patient have any problems obtaining medications due to transportation or finances?   no  Understanding of regimen: good Understanding of indications: good Potential of compliance: good Patient understands to avoid NSAIDs. Patient understands to avoid decongestants.  Issues to address at subsequent visits: None   Pharmacist comments:  Richard [name] is a pleasant 45 yo M presenting without a medication list but with great recall of his regimen. Patient was recently discharged from hospital and all medications have been reviewed. He reports good compliance with his regimen and did have concerns with new onset tingling in both hands. Bidil manufacturer labeling reports a 4% incidence of paresthesia but he does have diabetes with most recent A1c of 8.7%. I have advised him to speak to his PCP tomorrow about these symptoms. He did not have any other medication-related questions or concerns for me at this time.  [name]. [name], PharmD, BCPS, CPP Clinical Pharmacist Pager: [phone] Phone: [phone] 10/06/2015 2:50 PM      Time with patient: 10 minutes Preparation and documentation time: 2 minutes Total time: 12 minutes

## 2015-10-07 ENCOUNTER — Ambulatory Visit: Payer: Self-pay | Admitting: Family Medicine

## 2015-10-07 ENCOUNTER — Ambulatory Visit (INDEPENDENT_AMBULATORY_CARE_PROVIDER_SITE_OTHER): Payer: BLUE CROSS/BLUE SHIELD | Admitting: Family Medicine

## 2015-10-07 ENCOUNTER — Encounter: Payer: Self-pay | Admitting: Family Medicine

## 2015-10-07 VITALS — BP 118/84 | HR 95 | Temp 97.7°F | Ht 71.0 in | Wt 309.7 lb

## 2015-10-07 DIAGNOSIS — N183 Chronic kidney disease, stage 3 unspecified: Secondary | ICD-10-CM

## 2015-10-07 DIAGNOSIS — I10 Essential (primary) hypertension: Secondary | ICD-10-CM | POA: Diagnosis not present

## 2015-10-07 DIAGNOSIS — E1122 Type 2 diabetes mellitus with diabetic chronic kidney disease: Secondary | ICD-10-CM | POA: Diagnosis not present

## 2015-10-07 DIAGNOSIS — E669 Obesity, unspecified: Secondary | ICD-10-CM

## 2015-10-07 DIAGNOSIS — I5042 Chronic combined systolic (congestive) and diastolic (congestive) heart failure: Secondary | ICD-10-CM

## 2015-10-07 DIAGNOSIS — IMO0002 Reserved for concepts with insufficient information to code with codable children: Secondary | ICD-10-CM

## 2015-10-07 DIAGNOSIS — E1165 Type 2 diabetes mellitus with hyperglycemia: Secondary | ICD-10-CM

## 2015-10-07 MED ORDER — METFORMIN HCL 1000 MG PO TABS: 1000.0000 mg | ORAL_TABLET | Freq: Two times a day (BID) | ORAL | Status: DC

## 2015-10-07 MED ORDER — GLIPIZIDE 5 MG PO TABS: 5.0000 mg | ORAL_TABLET | Freq: Two times a day (BID) | ORAL | Status: DC

## 2015-10-07 NOTE — Patient Instructions (Signed)
Before you leave: -Schedule a follow-up appointment in about 3-4 months  Please take the metformin 1000 mg twice daily.  Restart the glipizide 5 mg once daily.  FOR YOUR DIABETES:  []  Eat a healthy low carb diet (avoid sweets, sweet drinks, dried fruit, canned fruit, breads, potatoes, rice, etc.) and ensure 3 small meals daily.  []  Get AT LEAST 150 minutes of aerobic cardiovascular exercise per week - 30 minutes per day is best of sustained sweaty exercise.  []  Take all of your medications every day as directed by your doctor. Call your doctor immediately if you have any questions about your medications or are running low.  []  Check your blood sugar often and when you feel unwell and keep a log to bring to all health appointments. FASTING: before you eat anything in the morning POSTPRANDIAL: 1-2 hours after a meal  []  If any low blood sugars < 70, eat a snack and call your doctor immediately.  []  See an eye doctor every year and fax your diabetic eye exam to our office.  Fax: [phone]  []  Take good care of your feet and keep them soft and callus free. Check your feet daily and wear comfortable shoes. See your doctor immediately if you have any cuts, calluses or wounds on your feet.

## 2015-10-07 NOTE — Progress Notes (Signed)
HPI:  Richard [name] is a 45 yo with an unfortunate PMH here for follow up. He has a historoy of noncompliance and not been in to see [place] in > 2 years.   DM, with CKD: -meds: asa stopped in the hospital by cardiology, acei, metformin -recent hgba1c in hospital 8.0 -remotely really got his sugars down with diet and exercise; reports he has continued with a fairly healthy diet, though has been having a lot of juice to his me these and eating a lot of trail mix  that has sugar - otherwise, reported diet is fairly healthy. He also reports he has not been exercising. Also he had only been taking his metformin once a day at most to stretch it out. -reports: After recent hospitalization he is motivated to take his medications as prescribed, follow up on a more regular basis and get back into exercising and work on his diet -eye exam: Scheduled in a few days -foot exam: Plan to do at next visit -denies: polyuria, polydipsia, vision changes, wounds on feet -Occasionally tingling in digits in both hands that just started after his hospitalization, he admits he was sitting in a hospital chair with elbows resting on the chair for several days  HTN, CsdCHF, CM, pulm HTN: -managed by his cardiologist whom he just saw -hospitalized recently for acute on chronic chf 2ndary likely to medication non-compliance per review cardiology notes. -meds: enestro 24-26 bid, lasix 40 bid, spiro 12.5, coreg 9.375 bid, bidil tid -has follow up with cardiologist again in 2 weeks -denies: CP, palpitations, swelling, sudden weight gain  ROS: See pertinent positives and negatives per HPI.  Past Medical History  Diagnosis Date  . Allergy   . Hypertension   . Obesity   . CHF (congestive heart failure) (HCC)     systolic  . Chronic kidney disease   . Acute decompensated heart failure ([place]) 01/03/2013  . CKD (chronic kidney disease) stage 2, GFR 60-89 ml/min 01/04/2013  . DM (diabetes mellitus) ([place]) 01/06/2013  . Hypertensive  emergency 01/03/2013  . Acute decompensated heart failure ([place]) 01/03/2013  . H/O diastolic dysfunction     Past Surgical History  Procedure Laterality Date  . Left and right heart catheterization with coronary angiogram N/A 01/07/2013    Procedure: LEFT AND RIGHT HEART CATHETERIZATION WITH CORONARY ANGIOGRAM;  Surgeon: Birdie Riddle, MD;  Location: [facility];  Service: Cardiovascular;  Laterality: N/A;    Family History  Problem Relation Age of Onset  . Heart disease Father 43    MI  . Hypertension Father   . Hyperlipidemia Father   . Diabetes Mother   . Hypertension Mother     Social History   Social History  . Marital Status: Single    Spouse Name: N/A  . Number of Children: N/A  . Years of Education: N/A   Social History Main Topics  . Smoking status: Never Smoker   . Smokeless tobacco: None  . Alcohol Use: No  . Drug Use: No  . Sexual Activity: Not Asked   Other Topics Concern  . None   Social History Narrative   Work or School: Engineer, agricultural, homeless Chief of Staff      Home Situation: lives alone      Spiritual Beliefs: Christian      Lifestyle: 15 minutes dialy walking - working up; working on diet              Current outpatient prescriptions:  .  Blood  Glucose Monitoring Suppl (ACURA BLOOD GLUCOSE METER) W/DEVICE KIT, 1 meter with lancets and test strips for daily BS testing QS 1 month, Disp: 1 kit, Rfl: 0 .  carvedilol (COREG) 6.25 MG tablet, Take 1.5 tablets (9.375 mg total) by mouth 2 (two) times daily with a meal., Disp: 90 tablet, Rfl: 11 .  furosemide (LASIX) 40 MG tablet, Take 1 tablet (40 mg total) by mouth 2 (two) times daily., Disp: 60 tablet, Rfl: 5 .  glucose blood (ONE TOUCH ULTRA TEST) test strip, Use as directed to check blood sugar once a day., Disp: 100 each, Rfl: 3 .  isosorbide-hydrALAZINE (BIDIL) 20-37.5 MG tablet, Take 1 tablet by mouth 3 (three) times daily., Disp: 90 tablet, Rfl: 3 .  magnesium 30 MG tablet, Take  30 mg by mouth 2 (two) times daily., Disp: , Rfl:  .  metFORMIN (GLUCOPHAGE) 1000 MG tablet, Take 1 tablet (1,000 mg total) by mouth 2 (two) times daily with a meal., Disp: 180 tablet, Rfl: 3 .  Multiple Vitamin (MULTIVITAMIN WITH MINERALS) TABS tablet, Take 1 tablet by mouth daily., Disp: , Rfl:  .  Omega-3 Fatty Acids (FISH OIL) 1000 MG CAPS, Take 1 capsule by mouth daily., Disp: , Rfl:  .  ONETOUCH DELICA LANCETS 16X MISC, 1 Device by Other route daily. Use as directed for glucose testing once a day., Disp: 100 each, Rfl: 3 .  potassium chloride SA (K-DUR,KLOR-CON) 20 MEQ tablet, Take 2 tablets (40 mEq total) by mouth 2 (two) times daily., Disp: 60 tablet, Rfl: 4 .  sacubitril-valsartan (ENTRESTO) 24-26 MG, Take 1 tablet by mouth 2 (two) times daily., Disp: 60 tablet, Rfl: 11 .  spironolactone (ALDACTONE) 25 MG tablet, Take 0.5 tablets (12.5 mg total) by mouth daily., Disp: 30 tablet, Rfl: 5 .  glipiZIDE (GLUCOTROL) 5 MG tablet, Take 1 tablet (5 mg total) by mouth 2 (two) times daily before a meal., Disp: 120 tablet, Rfl: 3  EXAM:  Filed Vitals:   10/07/15 0903  BP: 118/84  Pulse: 95  Temp: 97.7 F (36.5 C)    Body mass index is 43.21 kg/(m^2).  GENERAL: vitals reviewed and listed above, alert, oriented, appears well hydrated and in no acute distress  HEENT: atraumatic, conjunttiva clear, no obvious abnormalities on inspection of external nose and ears  NECK: no obvious masses on inspection  LUNGS: clear to auscultation bilaterally, no wheezes, rales or rhonchi, good air movement  CV: HRRR, tr peripheral ankle edema  MS: moves all extremities without noticeable abnormality  PSYCH: pleasant and cooperative, no obvious depression or anxiety  ASSESSMENT AND PLAN:  Discussed the following assessment and plan: > 40 minutes spent in caring for this pt with > 50% face to face.  Uncontrolled type 2 diabetes mellitus with stage 3 chronic kidney disease, without long-term current  use of insulin (HCC) -Lengthy discussion about the importance of control, regular follow-up, adherence to medications and lifelong commitment to a healthy lifestyle -Lengthy discussion about the importance of lifestyle including a healthy low carb diet, small portions and regular aerobic exercise - I am excited that he is motivated to do this and has proven that he has good discipline in the past. He has already been doing a fairly good job with his diet, but has some room for improvement with a few hidden source of sugar. -He plans to get back on track with taking his metformin on a regular basis and we have added back glipizide for the time being  -Advised a diabetic  eye exam and he reports this is already scheduled -Plan to follow up in 3 months, and foot exam for that visit   Essential hypertension, benign -Blood pressure is good today   Obesity -Lifestyle recommendations, see above   Chronic combined systolic and diastolic CHF, NYHA class 2 ([place]) -Recent cardiology notes reviewed  -Continue current treatment   CKD (chronic kidney disease), stage III -Continue current treatment  Parestheisa: -query from resting elbows on chair? -monitor, check B12 and neuro referral for eval if persists - advised to call if persists over next few weeks, odd presentation for diabetic neuropathy, but also a possibility and have advised control of his diabetes.  -Patient advised to return or notify a doctor immediately if symptoms worsen or persist or new concerns arise.  Patient Instructions  Before you leave: -Schedule a follow-up appointment in about 3-4 months  Please take the metformin 1000 mg twice daily.  Restart the glipizide 5 mg once daily.  FOR YOUR DIABETES:  []  Eat a healthy low carb diet (avoid sweets, sweet drinks, dried fruit, canned fruit, breads, potatoes, rice, etc.) and ensure 3 small meals daily.  []  Get AT LEAST 150 minutes of aerobic cardiovascular exercise per week - 30  minutes per day is best of sustained sweaty exercise.  []  Take all of your medications every day as directed by your doctor. Call your doctor immediately if you have any questions about your medications or are running low.  []  Check your blood sugar often and when you feel unwell and keep a log to bring to all health appointments. FASTING: before you eat anything in the morning POSTPRANDIAL: 1-2 hours after a meal  []  If any low blood sugars < 70, eat a snack and call your doctor immediately.  []  See an eye doctor every year and fax your diabetic eye exam to our office.  Fax: [phone]  []  Take good care of your feet and keep them soft and callus free. Check your feet daily and wear comfortable shoes. See your doctor immediately if you have any cuts, calluses or wounds on your feet.      Colin Benton R.

## 2015-10-07 NOTE — Progress Notes (Signed)
Pre visit review using our clinic review tool, if applicable. No additional management support is needed unless otherwise documented below in the visit note. 

## 2015-10-19 NOTE — Progress Notes (Signed)
Patient ID: Richard [name], male   DOB: 1971-01-02, 45 y.o.   MRN: [id number]    Advanced Heart Failure Clinic Note    Primary Care: Colin Benton, DO Primary Cardiology: Dr. Doylene Canard Primary HF: Dr. Aundra Dubin   HPI:  Richard [name] is a 45 y.o. male with a past medical history that includes diabetes, chronic combined heart failure, hypertension, chronic kidney disease stage II.  Admitted from [facility] 09/26/15 with worsening SOB and LE edema. He reported medical non-compliance on his lasix regimen. Diuresed well, Down 14 lbs with discharge weight of 317 lbs.   Echo 09/27/2015 20-25%, grade 1 DD, RV mild dilated, moderately reduced, severe RAE.   He presents today for regular follow up.  At last visit increased Coreg in stepwise fashion with qhs dose first.  Tolerated well. Down 2 lbs from last visit (and wore steel toed shoes today) Is now back at work, had to adjust his medication timing at first with some fatigue, but doing much better now. He had one day dizziness when he took his medicines without a meal, but no other. Weight at home 300 lbs. Watching salt and fluid. Still has some paraesthesias in his arm, PCP following and may make referral to neuro if unresolved.   Social: Works 2 jobs. 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   Past Medical History  Diagnosis Date  . Allergy   . Hypertension   . Obesity   . CHF (congestive heart failure) (HCC)     systolic  . Chronic kidney disease   . Acute decompensated heart failure ([place]) 01/03/2013  . CKD (chronic kidney disease) stage 2, GFR 60-89 ml/min 01/04/2013  . DM (diabetes mellitus) ([place]) 01/06/2013  . Hypertensive emergency 01/03/2013  . Acute decompensated heart failure ([place]) 01/03/2013  . H/O diastolic dysfunction     Current Outpatient Prescriptions  Medication Sig Dispense Refill  . Blood Glucose Monitoring Suppl (ACURA BLOOD GLUCOSE METER) W/DEVICE KIT 1 meter with lancets and test strips for daily  BS testing QS 1 month 1 kit 0  . carvedilol (COREG) 6.25 MG tablet Take 1.5 tablets (9.375 mg total) by mouth 2 (two) times daily with a meal. 90 tablet 11  . furosemide (LASIX) 40 MG tablet Take 1 tablet (40 mg total) by mouth 2 (two) times daily. 60 tablet 5  . glipiZIDE (GLUCOTROL) 5 MG tablet Take 1 tablet (5 mg total) by mouth 2 (two) times daily before a meal. 120 tablet 3  . glucose blood (ONE TOUCH ULTRA TEST) test strip Use as directed to check blood sugar once a day. 100 each 3  . isosorbide-hydrALAZINE (BIDIL) 20-37.5 MG tablet Take 1 tablet by mouth 3 (three) times daily. 90 tablet 3  . magnesium 30 MG tablet Take 30 mg by mouth 2 (two) times daily.    . metFORMIN (GLUCOPHAGE) 1000 MG tablet Take 1 tablet (1,000 mg total) by mouth 2 (two) times daily with a meal. 180 tablet 3  . Multiple Vitamin (MULTIVITAMIN WITH MINERALS) TABS tablet Take 1 tablet by mouth daily.    . Omega-3 Fatty Acids (FISH OIL) 1000 MG CAPS Take 1 capsule by mouth daily.    Glory Rosebush DELICA LANCETS 45Y MISC 1 Device by Other route daily. Use as directed for glucose testing once a day. 100 each 3  . potassium chloride SA (K-DUR,KLOR-CON) 20 MEQ tablet Take 2 tablets (40 mEq total) by mouth 2 (two) times daily. 60 tablet 4  .  sacubitril-valsartan (ENTRESTO) 24-26 MG Take 1 tablet by mouth 2 (two) times daily. 60 tablet 11  . spironolactone (ALDACTONE) 25 MG tablet Take 0.5 tablets (12.5 mg total) by mouth daily. 30 tablet 5   No current facility-administered medications for this encounter.    No Known Allergies    Social History   Social History  . Marital Status: Single    Spouse Name: N/A  . Number of Children: N/A  . Years of Education: N/A   Occupational History  . Not on file.   Social History Main Topics  . Smoking status: Never Smoker   . Smokeless tobacco: Not on file  . Alcohol Use: No  . Drug Use: No  . Sexual Activity: Not on file   Other Topics Concern  . Not on file   Social  History Narrative   Work or School: Engineer, agricultural, homeless Chief of Staff      Home Situation: lives alone      Spiritual Beliefs: Christian      Lifestyle: 15 minutes dialy walking - working up; working on diet               Family History  Problem Relation Age of Onset  . Heart disease Father 106    MI  . Hypertension Father   . Hyperlipidemia Father   . Diabetes Mother   . Hypertension Mother     [name] Vitals:   10/20/15 1506  BP: 124/86  Pulse: 83  Weight: 308 lb 9.6 oz (139.98 kg)  SpO2: 98%   Wt Readings from Last 3 Encounters:  10/20/15 308 lb 9.6 oz (139.98 kg)  10/07/15 309 lb 11.2 oz (140.479 kg)  10/06/15 310 lb 6.4 oz (140.797 kg)     PHYSICAL EXAM: General:  Well appearing. No respiratory difficulty HEENT: normal Neck: supple. JVD 5-6. Carotids 2+ bilat; no bruits. No thyromegaly or nodule noted.  Cor: PMI nondisplaced. RRR. No M/G/R Lungs: Clear Abdomen: soft, non-tender, non-distended, no HSM. No bruits or masses. +BS  Extremities: no cyanosis, clubbing, rash, trace ankle edema Neuro: alert & oriented x 3, cranial nerves grossly intact. moves all 4 extremities w/o difficulty. Affect pleasant.   ASSESSMENT & PLAN:  1. Chronic combined CHF - Echo 09/27/2015 20-25%, RV mild dilated, moderately reduced, severe LAE. NICM likely 2/2 poorly controlled HTN. Had [place] in 2014 negative for CAD. EF was 25-30% at that time.  - NYHA class II symptoms. Doing much better - Volume status stable on exam. Continue lasix 40 mg BID. BMET today.  - Increased Entresto 49/51 mg BID after finishing out his current pills of 24/26.  - Continue spiro 12.5 mg daily.  - Continue coreg 9.375 mg BID   - Continue Bidil 1 tab TID.  - HIV negative, SPEP negative, no M spike. UPEP negative. 2. HTN - Stable, Med changes as above.  3. CKD: Stage III.  - Stable last check. BMET today.  4. DM2 - Poorly controlled with A1C > 8. - Per PCP  He has a little  over a week of Entresto 24/26 left. Will plan on increasing to [facility] 49/51 once he finishes and will recheck BMET at follow up with Dr. Haroldine Laws in 4 weeks.    Shirley Friar, PA-C 10/20/2015

## 2015-10-20 ENCOUNTER — Ambulatory Visit (HOSPITAL_COMMUNITY)
Admission: RE | Admit: 2015-10-20 | Discharge: 2015-10-20 | Disposition: A | Discharge status: Home / Self Care | Payer: BLUE CROSS/BLUE SHIELD | Source: Ambulatory Visit | Attending: Internal Medicine | Admitting: Internal Medicine

## 2015-10-20 VITALS — BP 124/86 | HR 83 | Wt 308.6 lb

## 2015-10-20 DIAGNOSIS — Z8249 Family history of ischemic heart disease and other diseases of the circulatory system: Secondary | ICD-10-CM | POA: Insufficient documentation

## 2015-10-20 DIAGNOSIS — N183 Chronic kidney disease, stage 3 unspecified: Secondary | ICD-10-CM

## 2015-10-20 DIAGNOSIS — Z79899 Other long term (current) drug therapy: Secondary | ICD-10-CM | POA: Diagnosis not present

## 2015-10-20 DIAGNOSIS — I5042 Chronic combined systolic (congestive) and diastolic (congestive) heart failure: Secondary | ICD-10-CM

## 2015-10-20 DIAGNOSIS — I428 Other cardiomyopathies: Secondary | ICD-10-CM | POA: Diagnosis not present

## 2015-10-20 DIAGNOSIS — E1122 Type 2 diabetes mellitus with diabetic chronic kidney disease: Secondary | ICD-10-CM

## 2015-10-20 DIAGNOSIS — I10 Essential (primary) hypertension: Secondary | ICD-10-CM

## 2015-10-20 DIAGNOSIS — Z7984 Long term (current) use of oral hypoglycemic drugs: Secondary | ICD-10-CM | POA: Diagnosis not present

## 2015-10-20 DIAGNOSIS — Z833 Family history of diabetes mellitus: Secondary | ICD-10-CM | POA: Insufficient documentation

## 2015-10-20 DIAGNOSIS — E1165 Type 2 diabetes mellitus with hyperglycemia: Secondary | ICD-10-CM | POA: Diagnosis not present

## 2015-10-20 DIAGNOSIS — I130 Hypertensive heart and chronic kidney disease with heart failure and stage 1 through stage 4 chronic kidney disease, or unspecified chronic kidney disease: Secondary | ICD-10-CM | POA: Diagnosis not present

## 2015-10-20 DIAGNOSIS — E669 Obesity, unspecified: Secondary | ICD-10-CM | POA: Insufficient documentation

## 2015-10-20 DIAGNOSIS — IMO0002 Reserved for concepts with insufficient information to code with codable children: Secondary | ICD-10-CM

## 2015-10-20 LAB — BASIC METABOLIC PANEL
ANION GAP: 13 (ref 5–15)
BUN: 25 mg/dL — ABNORMAL HIGH (ref 6–20)
CHLORIDE: 103 mmol/L (ref 101–111)
CO2: 23 mmol/L (ref 22–32)
Calcium: 9.5 mg/dL (ref 8.9–10.3)
Creatinine, Ser: 1.61 mg/dL — ABNORMAL HIGH (ref 0.61–1.24)
GFR calc Af Amer: 58 mL/min — ABNORMAL LOW (ref >60)
GFR, EST NON AFRICAN AMERICAN: 50 mL/min — AB (ref >60)
GLUCOSE: 95 mg/dL (ref 65–99)
POTASSIUM: 3.9 mmol/L (ref 3.5–5.1)
Sodium: 139 mmol/L (ref 135–145)

## 2015-10-20 MED ORDER — SACUBITRIL-VALSARTAN 49-51 MG PO TABS: 1.0000 | ORAL_TABLET | Freq: Two times a day (BID) | ORAL | Status: DC

## 2015-10-20 NOTE — Patient Instructions (Signed)
INCREASE ENTRESTO TO 49/51 MG, ONE TAB TWICE A DAY  LABS TODAY  Your physician recommends that you schedule a follow-up appointment AS SCHEDULED  Do the following things EVERYDAY: 1) Weigh yourself in the morning before breakfast. Write it down and keep it in a log. 2) Take your medicines as prescribed 3) Eat low salt foods-Limit salt (sodium) to 2000 mg per day.  4) Stay as active as you can everyday 5) Limit all fluids for the day to less than 2 liters 6)

## 2015-10-20 NOTE — Progress Notes (Signed)
Advanced Heart Failure Medication Review by a Pharmacist  Does the patient  feel that his/her medications are working for him/her?  yes  Has the patient been experiencing any side effects to the medications prescribed?  no  Does the patient measure his/her own blood pressure or blood glucose at home?  yes   Does the patient have any problems obtaining medications due to transportation or finances?   no  Understanding of regimen: good Understanding of indications: good Potential of compliance: good Patient understands to avoid NSAIDs. Patient understands to avoid decongestants.  Issues to address at subsequent visits: None   Pharmacist comments:  Richard [name] is a pleasant 45 yo M presenting without a medication list but with good recall of his regimen including dosages. He reports good compliance with his regimen and did not have any specific medication-related questions or concerns for me at this time.   [name]. [name], PharmD, BCPS, CPP Clinical Pharmacist Pager: [phone] Phone: [phone] 10/20/2015 3:21 PM      Time with patient: 10 minutes Preparation and documentation time: 2 minutes Total time: 12 minutes

## 2015-11-01 ENCOUNTER — Telehealth (HOSPITAL_COMMUNITY): Payer: Self-pay | Admitting: *Deleted

## 2015-11-01 NOTE — Telephone Encounter (Signed)
Pt called stating he is out of [place] and pharmacy told him he needs PA from [facility].  Samples left at front desk for pt, will work on PA  Medication Samples have been provided to the patient.  Drug name: Delene Loll       Strength: 49/51 mg        Qty: 1 box  LOT: [id number]  Exp.Date: 10/18  Dosing instructions: 1 tab BID  The patient has been instructed regarding the correct time, dose, and frequency of taking this medication, including desired effects and most common side effects.   Mikaele Stecher 9:51 AM 11/01/2015

## 2015-11-02 ENCOUNTER — Telehealth (HOSPITAL_COMMUNITY): Payer: Self-pay | Admitting: *Deleted

## 2015-11-02 NOTE — Telephone Encounter (Signed)
Completed PA for pt's Richard [name], med was approved 11/01/15-10/31/16 ref # [id number], Jamul aware, attempted to notify pt and left VM that this was done c/b for further issues

## 2015-11-21 ENCOUNTER — Encounter (HOSPITAL_COMMUNITY): Payer: Self-pay

## 2015-11-21 ENCOUNTER — Ambulatory Visit (HOSPITAL_COMMUNITY)
Admission: RE | Admit: 2015-11-21 | Discharge: 2015-11-21 | Disposition: A | Discharge status: Home / Self Care | Payer: BLUE CROSS/BLUE SHIELD | Source: Ambulatory Visit | Attending: Cardiology | Admitting: Cardiology

## 2015-11-21 VITALS — BP 132/88 | HR 86 | Wt 297.0 lb

## 2015-11-21 DIAGNOSIS — N183 Chronic kidney disease, stage 3 unspecified: Secondary | ICD-10-CM

## 2015-11-21 DIAGNOSIS — I130 Hypertensive heart and chronic kidney disease with heart failure and stage 1 through stage 4 chronic kidney disease, or unspecified chronic kidney disease: Secondary | ICD-10-CM | POA: Diagnosis not present

## 2015-11-21 DIAGNOSIS — Z7984 Long term (current) use of oral hypoglycemic drugs: Secondary | ICD-10-CM | POA: Insufficient documentation

## 2015-11-21 DIAGNOSIS — E669 Obesity, unspecified: Secondary | ICD-10-CM | POA: Insufficient documentation

## 2015-11-21 DIAGNOSIS — I5042 Chronic combined systolic (congestive) and diastolic (congestive) heart failure: Secondary | ICD-10-CM | POA: Diagnosis not present

## 2015-11-21 DIAGNOSIS — Z79899 Other long term (current) drug therapy: Secondary | ICD-10-CM | POA: Insufficient documentation

## 2015-11-21 DIAGNOSIS — Z6841 Body Mass Index (BMI) 40.0 and over, adult: Secondary | ICD-10-CM | POA: Diagnosis not present

## 2015-11-21 DIAGNOSIS — Z8249 Family history of ischemic heart disease and other diseases of the circulatory system: Secondary | ICD-10-CM | POA: Diagnosis not present

## 2015-11-21 DIAGNOSIS — E1122 Type 2 diabetes mellitus with diabetic chronic kidney disease: Secondary | ICD-10-CM | POA: Diagnosis not present

## 2015-11-21 DIAGNOSIS — Z833 Family history of diabetes mellitus: Secondary | ICD-10-CM | POA: Diagnosis not present

## 2015-11-21 DIAGNOSIS — I10 Essential (primary) hypertension: Secondary | ICD-10-CM

## 2015-11-21 DIAGNOSIS — I5022 Chronic systolic (congestive) heart failure: Secondary | ICD-10-CM | POA: Insufficient documentation

## 2015-11-21 LAB — BASIC METABOLIC PANEL
Anion gap: 12 (ref 5–15)
BUN: 17 mg/dL (ref 6–20)
CHLORIDE: 105 mmol/L (ref 101–111)
CO2: 20 mmol/L — AB (ref 22–32)
Calcium: 9.5 mg/dL (ref 8.9–10.3)
Creatinine, Ser: 1.79 mg/dL — ABNORMAL HIGH (ref 0.61–1.24)
GFR calc Af Amer: 51 mL/min — ABNORMAL LOW (ref >60)
GFR calc non Af Amer: 44 mL/min — ABNORMAL LOW (ref >60)
Glucose, Bld: 92 mg/dL (ref 65–99)
POTASSIUM: 3.9 mmol/L (ref 3.5–5.1)
SODIUM: 137 mmol/L (ref 135–145)

## 2015-11-21 LAB — BRAIN NATRIURETIC PEPTIDE: B Natriuretic Peptide: 144.9 pg/mL — ABNORMAL HIGH (ref 0.0–100.0)

## 2015-11-21 MED ORDER — CARVEDILOL 12.5 MG PO TABS: 12.5000 mg | ORAL_TABLET | Freq: Two times a day (BID) | ORAL | Status: DC

## 2015-11-21 MED ORDER — SPIRONOLACTONE 25 MG PO TABS: 25.0000 mg | ORAL_TABLET | Freq: Every day | ORAL | Status: DC

## 2015-11-21 NOTE — Patient Instructions (Signed)
INCREASE Carvedilol to 12.5 mg twice daily. Can take 2 tablets twice daily of your current Rx (6.25 mg tabs). New Rx will be 12.5 mg tablets (take 1 tab twice daily).  INCREASE Spironolactone 25 mg (1 whole tab) once daily.  Routine lab work today. Will notify you of abnormal results, otherwise no news is good news!  Return for repeats labs in 2 weeks.  Follow up 6 weeks with Dr. Aundra Dubin.  Do the following things EVERYDAY: 1) Weigh yourself in the morning before breakfast. Write it down and keep it in a log. 2) Take your medicines as prescribed 3) Eat low salt foods-Limit salt (sodium) to 2000 mg per day.  4) Stay as active as you can everyday 5) Limit all fluids for the day to less than 2 liters

## 2015-11-22 NOTE — Progress Notes (Signed)
Patient ID: Richard [name], male   DOB: 1970-12-22, 45 y.o.   MRN: [id number]    Advanced Heart Failure Clinic Note    Primary Care: Colin Benton, DO Cardiology:  Dr. Aundra Dubin   HPI:  Richard [name] is a 45 y.o. male with a past medical history that includes diabetes, chronic combined heart failure, hypertension, chronic kidney disease stage II.  Admitted from [facility] 09/26/15 with worsening SOB and LE edema. He reported medical non-compliance on his lasix regimen. Diuresed well, Down 14 lbs with discharge weight of 317 lbs.   Echo 09/27/2015 20-25%, grade 1 DD, RV mild dilated, moderately reduced, severe RAE.   He presents today for regular follow up.  He has been doing well overall.  Weight down 11 lbs.  He rides a stationary bike every other day and works out with [employer].  BP is controlled.  No exertional dyspnea, fatigue, or chest pain. No orthopnea/PND.    ECG (4/17): NSR, narrow QRS, lateral TWIs  Labs (4/17): K 3.9, creatinine 1.61  ROS: All systems reviewed and negative except as per HPI.   PMH: 1. Chronic systolic CHF: Nonischemic CMP.  Likely due to poorly controlled HTN. HIV negative, SPEP negative, no M spike. UPEP negative. - Echo (2014): EF 20-25%.  - LHC (2014) with no significant CAD. - Echo (4/17) with EF 20-25%, RV mildly dilated, RV systolic function moderately reduced, severe LAE  2. HTN 3. CKD stage III 4. Obesity 5. Type II diabetes  Current Outpatient Prescriptions  Medication Sig Dispense Refill  . Blood Glucose Monitoring Suppl (ACURA BLOOD GLUCOSE METER) W/DEVICE KIT 1 meter with lancets and test strips for daily BS testing QS 1 month 1 kit 0  . carvedilol (COREG) 12.5 MG tablet Take 1 tablet (12.5 mg total) by mouth 2 (two) times daily with a meal. 60 tablet 6  . furosemide (LASIX) 40 MG tablet Take 1 tablet (40 mg total) by mouth 2 (two) times daily. 60 tablet 5  . glipiZIDE (GLUCOTROL) 5 MG tablet Take 1 tablet (5 mg total) by mouth 2 (two) times  daily before a meal. 120 tablet 3  . glucose blood (ONE TOUCH ULTRA TEST) test strip Use as directed to check blood sugar once a day. 100 each 3  . isosorbide-hydrALAZINE (BIDIL) 20-37.5 MG tablet Take 1 tablet by mouth 3 (three) times daily. 90 tablet 3  . LevOCARNitine (L-CARNITINE) 250 MG CAPS Take 250 mg by mouth daily.    . magnesium 30 MG tablet Take 30 mg by mouth 2 (two) times daily.    . metFORMIN (GLUCOPHAGE) 1000 MG tablet Take 1 tablet (1,000 mg total) by mouth 2 (two) times daily with a meal. 180 tablet 3  . Multiple Vitamin (MULTIVITAMIN WITH MINERALS) TABS tablet Take 1 tablet by mouth daily.    . Omega-3 Fatty Acids (FISH OIL) 1000 MG CAPS Take 1 capsule by mouth daily.    Glory Rosebush DELICA LANCETS 21H MISC 1 Device by Other route daily. Use as directed for glucose testing once a day. 100 each 3  . potassium chloride SA (K-DUR,KLOR-CON) 20 MEQ tablet Take 2 tablets (40 mEq total) by mouth 2 (two) times daily. 60 tablet 4  . sacubitril-valsartan (ENTRESTO) 49-51 MG Take 1 tablet by mouth 2 (two) times daily. 60 tablet 3  . spironolactone (ALDACTONE) 25 MG tablet Take 1 tablet (25 mg total) by mouth daily. 30 tablet 6   No current facility-administered medications for this encounter.    No Known Allergies  Social History   Social History  . Marital Status: Single    Spouse Name: N/A  . Number of Children: N/A  . Years of Education: N/A   Occupational History  . Not on file.   Social History Main Topics  . Smoking status: Never Smoker   . Smokeless tobacco: Not on file  . Alcohol Use: No  . Drug Use: No  . Sexual Activity: Not on file   Other Topics Concern  . Not on file   Social History Narrative   Work or School: Engineer, agricultural, homeless Chief of Staff      Home Situation: lives alone      Spiritual Beliefs: Christian      Lifestyle: 15 minutes dialy walking - working up; working on diet               Family History  Problem  Relation Age of Onset  . Heart disease Father 36    MI  . Hypertension Father   . Hyperlipidemia Father   . Diabetes Mother   . Hypertension Mother     [name] Vitals:   11/21/15 1455  BP: 132/88  Pulse: 86  Weight: 297 lb (134.718 kg)  SpO2: 100%   Wt Readings from Last 3 Encounters:  11/21/15 297 lb (134.718 kg)  10/20/15 308 lb 9.6 oz (139.98 kg)  10/07/15 309 lb 11.2 oz (140.479 kg)     PHYSICAL EXAM: General:  Well appearing. No respiratory difficulty HEENT: normal Neck: supple. JVD 5-6. Carotids 2+ bilat; no bruits. No thyromegaly or nodule noted.  Cor: PMI nondisplaced. RRR. No M/G/R Lungs: Clear Abdomen: soft, non-tender, non-distended, no HSM. No bruits or masses. +BS  Extremities: no cyanosis, clubbing, rash, trace ankle edema Neuro: alert & oriented x 3, cranial nerves grossly intact. moves all 4 extremities w/o difficulty. Affect pleasant.  ASSESSMENT & PLAN:   1. Chronic systolic CHF:  Echo 06/22/8675 EF 20-25%, RV mild dilated with moderately reduced systolic function, severe LAE. Nonischemic cardiomyopathy, likely 2/2 poorly controlled HTN. HIV negative, SPEP negative, no M spike. UPEP negative.  Had [facility] in 2014, negative for CAD. EF was 25-30% at that time.  NYHA class II symptoms. Doing much better.  He is not volume overloaded.  - Continue Lasix 40 mg BID. BMET/BNP today.  - Continue Entresto 49/51 bid.  - Increase spironolactone to 25 mg daily, repeat BMET in 2 wks. - Increase Coreg to 12.5 mg bid.  - Continue Bidil 1 tab TID.  - Repeat echo in 10/17.  2. HTN: BP controlled.  3. CKD: Stage III. BMET today.  Suspect hypertensive nephropathy.   4. DM2: Followed by PCP.   Richard [name] 11/22/2015

## 2015-12-05 ENCOUNTER — Ambulatory Visit (HOSPITAL_COMMUNITY)
Admission: RE | Admit: 2015-12-05 | Discharge: 2015-12-05 | Disposition: A | Discharge status: Home / Self Care | Payer: BLUE CROSS/BLUE SHIELD | Source: Ambulatory Visit | Attending: Internal Medicine | Admitting: Internal Medicine

## 2015-12-05 DIAGNOSIS — I5023 Acute on chronic systolic (congestive) heart failure: Secondary | ICD-10-CM | POA: Diagnosis not present

## 2015-12-05 DIAGNOSIS — I5042 Chronic combined systolic (congestive) and diastolic (congestive) heart failure: Secondary | ICD-10-CM | POA: Diagnosis not present

## 2015-12-05 LAB — BASIC METABOLIC PANEL
ANION GAP: 9 (ref 5–15)
BUN: 26 mg/dL — ABNORMAL HIGH (ref 6–20)
CALCIUM: 9.8 mg/dL (ref 8.9–10.3)
CO2: 21 mmol/L — AB (ref 22–32)
CREATININE: 1.78 mg/dL — AB (ref 0.61–1.24)
Chloride: 108 mmol/L (ref 101–111)
GFR, EST AFRICAN AMERICAN: 51 mL/min — AB (ref >60)
GFR, EST NON AFRICAN AMERICAN: 44 mL/min — AB (ref >60)
Glucose, Bld: 75 mg/dL (ref 65–99)
Potassium: 4.3 mmol/L (ref 3.5–5.1)
Sodium: 138 mmol/L (ref 135–145)

## 2015-12-16 ENCOUNTER — Other Ambulatory Visit: Payer: Self-pay | Admitting: Physician Assistant

## 2015-12-19 ENCOUNTER — Other Ambulatory Visit (HOSPITAL_COMMUNITY): Payer: Self-pay | Admitting: *Deleted

## 2015-12-19 MED ORDER — POTASSIUM CHLORIDE CRYS ER 20 MEQ PO TBCR: 40.0000 meq | EXTENDED_RELEASE_TABLET | Freq: Two times a day (BID) | ORAL | Status: DC

## 2015-12-20 ENCOUNTER — Other Ambulatory Visit (HOSPITAL_COMMUNITY): Payer: Self-pay | Admitting: *Deleted

## 2015-12-20 DIAGNOSIS — I509 Heart failure, unspecified: Secondary | ICD-10-CM

## 2015-12-20 MED ORDER — ISOSORB DINITRATE-HYDRALAZINE 20-37.5 MG PO TABS: 1.0000 | ORAL_TABLET | Freq: Three times a day (TID) | ORAL | Status: DC

## 2015-12-20 MED ORDER — FUROSEMIDE 40 MG PO TABS: 40.0000 mg | ORAL_TABLET | Freq: Two times a day (BID) | ORAL | Status: DC

## 2016-01-03 ENCOUNTER — Ambulatory Visit (HOSPITAL_COMMUNITY)
Admission: RE | Admit: 2016-01-03 | Discharge: 2016-01-03 | Disposition: A | Discharge status: Home / Self Care | Payer: BLUE CROSS/BLUE SHIELD | Source: Ambulatory Visit | Attending: Cardiology | Admitting: Cardiology

## 2016-01-03 DIAGNOSIS — Z8249 Family history of ischemic heart disease and other diseases of the circulatory system: Secondary | ICD-10-CM | POA: Insufficient documentation

## 2016-01-03 DIAGNOSIS — I10 Essential (primary) hypertension: Secondary | ICD-10-CM

## 2016-01-03 DIAGNOSIS — Z79899 Other long term (current) drug therapy: Secondary | ICD-10-CM | POA: Diagnosis not present

## 2016-01-03 DIAGNOSIS — Z7984 Long term (current) use of oral hypoglycemic drugs: Secondary | ICD-10-CM | POA: Insufficient documentation

## 2016-01-03 DIAGNOSIS — Z833 Family history of diabetes mellitus: Secondary | ICD-10-CM | POA: Insufficient documentation

## 2016-01-03 DIAGNOSIS — I5022 Chronic systolic (congestive) heart failure: Secondary | ICD-10-CM | POA: Insufficient documentation

## 2016-01-03 DIAGNOSIS — I130 Hypertensive heart and chronic kidney disease with heart failure and stage 1 through stage 4 chronic kidney disease, or unspecified chronic kidney disease: Secondary | ICD-10-CM | POA: Insufficient documentation

## 2016-01-03 DIAGNOSIS — E669 Obesity, unspecified: Secondary | ICD-10-CM | POA: Diagnosis not present

## 2016-01-03 DIAGNOSIS — E1122 Type 2 diabetes mellitus with diabetic chronic kidney disease: Secondary | ICD-10-CM | POA: Diagnosis not present

## 2016-01-03 DIAGNOSIS — Z6839 Body mass index (BMI) 39.0-39.9, adult: Secondary | ICD-10-CM | POA: Diagnosis not present

## 2016-01-03 DIAGNOSIS — I428 Other cardiomyopathies: Secondary | ICD-10-CM | POA: Diagnosis not present

## 2016-01-03 DIAGNOSIS — I509 Heart failure, unspecified: Secondary | ICD-10-CM | POA: Diagnosis not present

## 2016-01-03 DIAGNOSIS — I5042 Chronic combined systolic (congestive) and diastolic (congestive) heart failure: Secondary | ICD-10-CM

## 2016-01-03 DIAGNOSIS — N183 Chronic kidney disease, stage 3 (moderate): Secondary | ICD-10-CM | POA: Diagnosis not present

## 2016-01-03 LAB — BASIC METABOLIC PANEL
Anion gap: 9 (ref 5–15)
BUN: 25 mg/dL — ABNORMAL HIGH (ref 6–20)
CALCIUM: 9.6 mg/dL (ref 8.9–10.3)
CO2: 22 mmol/L (ref 22–32)
CREATININE: 2.02 mg/dL — AB (ref 0.61–1.24)
Chloride: 109 mmol/L (ref 101–111)
GFR calc non Af Amer: 38 mL/min — ABNORMAL LOW (ref >60)
GFR, EST AFRICAN AMERICAN: 44 mL/min — AB (ref >60)
GLUCOSE: 86 mg/dL (ref 65–99)
Potassium: 4.2 mmol/L (ref 3.5–5.1)
Sodium: 140 mmol/L (ref 135–145)

## 2016-01-03 LAB — BRAIN NATRIURETIC PEPTIDE: B Natriuretic Peptide: 31.6 pg/mL (ref 0.0–100.0)

## 2016-01-03 MED ORDER — CARVEDILOL 12.5 MG PO TABS: 18.7500 mg | ORAL_TABLET | Freq: Two times a day (BID) | ORAL | 6 refills | Status: DC

## 2016-01-03 MED ORDER — FUROSEMIDE 40 MG PO TABS: 40.0000 mg | ORAL_TABLET | Freq: Two times a day (BID) | ORAL | 6 refills | Status: DC

## 2016-01-03 MED ORDER — ISOSORB DINITRATE-HYDRALAZINE 20-37.5 MG PO TABS: 1.0000 | ORAL_TABLET | Freq: Three times a day (TID) | ORAL | 6 refills | Status: DC

## 2016-01-03 MED ORDER — SPIRONOLACTONE 25 MG PO TABS: 25.0000 mg | ORAL_TABLET | Freq: Every day | ORAL | 6 refills | Status: DC

## 2016-01-03 MED ORDER — SACUBITRIL-VALSARTAN 49-51 MG PO TABS: 1.0000 | ORAL_TABLET | Freq: Two times a day (BID) | ORAL | 6 refills | Status: DC

## 2016-01-03 MED ORDER — POTASSIUM CHLORIDE CRYS ER 20 MEQ PO TBCR: 40.0000 meq | EXTENDED_RELEASE_TABLET | Freq: Two times a day (BID) | ORAL | 6 refills | Status: DC

## 2016-01-03 NOTE — Patient Instructions (Signed)
Increase Carvedilol to 18.75 mg (1 & 1/2 tab) Twice daily   Your physician recommends that you schedule a follow-up appointment in: 6 weeks

## 2016-01-04 ENCOUNTER — Telehealth (HOSPITAL_COMMUNITY): Payer: Self-pay | Admitting: *Deleted

## 2016-01-04 DIAGNOSIS — I509 Heart failure, unspecified: Secondary | ICD-10-CM

## 2016-01-04 MED ORDER — FUROSEMIDE 40 MG PO TABS: 40.0000 mg | ORAL_TABLET | Freq: Every day | ORAL | 6 refills | Status: DC

## 2016-01-04 NOTE — Progress Notes (Signed)
Patient ID: Richard [name], male   DOB: 04/09/1971, 45 y.o.   MRN: [id number]    Advanced Heart Failure Clinic Note    Primary Care: Colin Benton, DO Cardiology:  Dr. Aundra Dubin   HPI:  Richard [name] is a 45 y.o. male with a past medical history that includes diabetes, chronic combined heart failure, hypertension, chronic kidney disease stage II.  Admitted from [facility] 09/26/15 with worsening SOB and LE edema. He reported medical non-compliance on his lasix regimen. Diuresed well, Down 14 lbs with discharge weight of 317 lbs.   Echo 09/27/2015 20-25%, grade 1 DD, RV mild dilated, moderately reduced, severe RAE.   He presents today for regular follow up.  He has been doing well overall.  Weight down another 12 lbs.  He rides a stationary bike every other day and works out with [employer].  BP is better-controlled.  No exertional dyspnea, fatigue, or chest pain. No orthopnea/PND.    ECG (4/17): NSR, narrow QRS, lateral TWIs  Labs (4/17): K 3.9, creatinine 1.61 Labs (7/17): K 4.3, creatinine 1.78  ROS: All systems reviewed and negative except as per HPI.   PMH: 1. Chronic systolic CHF: Nonischemic CMP.  Likely due to poorly controlled HTN. HIV negative, SPEP negative, no M spike. UPEP negative. - Echo (2014): EF 20-25%.  - LHC (2014) with no significant CAD. - Echo (4/17) with EF 20-25%, RV mildly dilated, RV systolic function moderately reduced, severe LAE  2. HTN 3. CKD stage III 4. Obesity 5. Type II diabetes  Current Outpatient Prescriptions  Medication Sig Dispense Refill  . Blood Glucose Monitoring Suppl (ACURA BLOOD GLUCOSE METER) W/DEVICE KIT 1 meter with lancets and test strips for daily BS testing QS 1 month 1 kit 0  . carvedilol (COREG) 12.5 MG tablet Take 1.5 tablets (18.75 mg total) by mouth 2 (two) times daily with a meal. 90 tablet 6  . glipiZIDE (GLUCOTROL) 5 MG tablet Take 1 tablet (5 mg total) by mouth 2 (two) times daily before a meal. 120 tablet 3  . glucose blood  (ONE TOUCH ULTRA TEST) test strip Use as directed to check blood sugar once a day. 100 each 3  . isosorbide-hydrALAZINE (BIDIL) 20-37.5 MG tablet Take 1 tablet by mouth 3 (three) times daily. 90 tablet 6  . LevOCARNitine (L-CARNITINE) 250 MG CAPS Take 250 mg by mouth daily.    . magnesium 30 MG tablet Take 30 mg by mouth 2 (two) times daily.    . metFORMIN (GLUCOPHAGE) 1000 MG tablet Take 1 tablet (1,000 mg total) by mouth 2 (two) times daily with a meal. 180 tablet 3  . Multiple Vitamin (MULTIVITAMIN WITH MINERALS) TABS tablet Take 1 tablet by mouth daily.    . Omega-3 Fatty Acids (FISH OIL) 1000 MG CAPS Take 1 capsule by mouth daily.    Glory Rosebush DELICA LANCETS 50Y MISC 1 Device by Other route daily. Use as directed for glucose testing once a day. 100 each 3  . potassium chloride SA (K-DUR,KLOR-CON) 20 MEQ tablet Take 2 tablets (40 mEq total) by mouth 2 (two) times daily. 120 tablet 6  . sacubitril-valsartan (ENTRESTO) 49-51 MG Take 1 tablet by mouth 2 (two) times daily. 60 tablet 6  . spironolactone (ALDACTONE) 25 MG tablet Take 1 tablet (25 mg total) by mouth daily. 30 tablet 6  . furosemide (LASIX) 40 MG tablet Take 1 tablet (40 mg total) by mouth daily. 30 tablet 6   No current facility-administered medications for this encounter.  No Known Allergies    Social History   Social History  . Marital status: Single    Spouse name: N/A  . Number of children: N/A  . Years of education: N/A   Occupational History  . Not on file.   Social History Main Topics  . Smoking status: Never Smoker  . Smokeless tobacco: Not on file  . Alcohol use No  . Drug use: No  . Sexual activity: Not on file   Other Topics Concern  . 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 Heart disease Father 40    MI  . Hypertension Father   . Hyperlipidemia Father   . Diabetes Mother   . Hypertension Mother     Vitals:   01/03/16 1522  BP: (!) 142/86  Pulse: 78  SpO2: 98%  Weight: 285 lb 12 oz (129.6 kg)   Wt Readings from Last 3 Encounters:  01/03/16 285 lb 12 oz (129.6 kg)  11/21/15 297 lb (134.7 kg)  10/20/15 (!) 308 lb 9.6 oz (140 kg)     PHYSICAL EXAM: General:  Well appearing. No respiratory difficulty HEENT: normal Neck: supple. JVD 5-6. Carotids 2+ bilat; no bruits. No thyromegaly or nodule noted.  Cor: PMI nondisplaced. RRR. No M/G/R Lungs: Clear Abdomen: soft, non-tender, non-distended, no HSM. No bruits or masses. +BS  Extremities: no cyanosis, clubbing, rash, trace ankle edema Neuro: alert & oriented x 3, cranial nerves grossly intact. moves all 4 extremities w/o difficulty. Affect pleasant.  ASSESSMENT & PLAN:   1. Chronic systolic CHF:  Echo 5/37/4827 EF 20-25%, RV mild dilated with moderately reduced systolic function, severe LAE. Nonischemic cardiomyopathy, likely 2/2 poorly controlled HTN. HIV negative, SPEP negative, no M spike. UPEP negative.  Had [place] in 2014, negative for CAD. EF was 25-30% at that time.  NYHA class II symptoms. Doing much better.  He is not volume overloaded.  - Continue Lasix 40 mg BID. BMET/BNP today.  - Continue Entresto 49/51 bid.  - Continue spironolactone 25 daily. - Increase Coreg to 18.75 mg bid.  - Continue Bidil 1 tab TID.  - Repeat echo in 10/17.  2. HTN: BP controlled.  3. CKD: Stage III. BMET today.  Suspect hypertensive nephropathy.   4. DM2: Followed by PCP.   [name] 01/04/2016

## 2016-01-04 NOTE — Telephone Encounter (Signed)
Notes Recorded by Harvie Junior, CMA on 01/04/2016 at 3:41 PM EDT Patient called back he is aware of lab results and medication change. Patient has been added to the lab schedule 01/09/16 ------  Notes Recorded by Harvie Junior, [place] on 01/04/2016 at 3:36 PM EDT Attempted to reach patient by phone with lab results. Pt did not answer left voice message to call back for lab results. Will try patient again tomorrow. ------  Notes Recorded by Larey Dresser, MD on 01/04/2016 at 8:33 AM EDT Decrease Lasix to once daily rather than bid, repeat BMET 1 week. Call today.    Ref Range & Units 1d ago (01/03/16) 40mo ago (12/05/15) 76mo ago (11/21/15)   Sodium 135 - 145 mmol/L 140 138 137   Potassium 3.5 - 5.1 mmol/L 4.2 4.3 3.9   Chloride 101 - 111 mmol/L 109 108 105   CO2 22 - 32 mmol/L 22 21  20     Glucose, Bld 65 - 99 mg/dL 86 75 92   BUN 6 - 20 mg/dL 25  26  17    Creatinine, Ser 0.61 - 1.24 mg/dL 2.02  1.78  1.79    Calcium 8.9 - 10.3 mg/dL 9.6 9.8 9.5   GFR calc non Af Amer >60 mL/min 38  44  44    GFR calc Af Amer >60 mL/min 44  51CM  51CM   Comments: (NOTE)

## 2016-01-05 ENCOUNTER — Encounter: Payer: Self-pay | Admitting: Family Medicine

## 2016-01-05 ENCOUNTER — Ambulatory Visit (INDEPENDENT_AMBULATORY_CARE_PROVIDER_SITE_OTHER): Payer: BLUE CROSS/BLUE SHIELD | Admitting: Family Medicine

## 2016-01-05 VITALS — BP 122/80 | HR 87 | Temp 98.4°F | Ht 71.0 in | Wt 281.6 lb

## 2016-01-05 DIAGNOSIS — E1122 Type 2 diabetes mellitus with diabetic chronic kidney disease: Secondary | ICD-10-CM | POA: Diagnosis not present

## 2016-01-05 DIAGNOSIS — I10 Essential (primary) hypertension: Secondary | ICD-10-CM | POA: Diagnosis not present

## 2016-01-05 DIAGNOSIS — I5042 Chronic combined systolic (congestive) and diastolic (congestive) heart failure: Secondary | ICD-10-CM | POA: Diagnosis not present

## 2016-01-05 DIAGNOSIS — E1165 Type 2 diabetes mellitus with hyperglycemia: Secondary | ICD-10-CM

## 2016-01-05 DIAGNOSIS — IMO0002 Reserved for concepts with insufficient information to code with codable children: Secondary | ICD-10-CM

## 2016-01-05 DIAGNOSIS — N183 Chronic kidney disease, stage 3 unspecified: Secondary | ICD-10-CM

## 2016-01-05 DIAGNOSIS — E669 Obesity, unspecified: Secondary | ICD-10-CM | POA: Diagnosis not present

## 2016-01-05 LAB — POCT GLYCOSYLATED HEMOGLOBIN (HGB A1C): Hemoglobin A1C: 4.8

## 2016-01-05 MED ORDER — METFORMIN HCL 500 MG PO TABS: 500.0000 mg | ORAL_TABLET | Freq: Two times a day (BID) | ORAL | 1 refills | Status: DC

## 2016-01-05 NOTE — Patient Instructions (Signed)
BEFORE YOU LEAVE: -POC [id number] -follow up: 3-4 months  Congratulations on the lifestyle changes!   We recommend the following healthy lifestyle for LIFE: 1) Small portions - eat off of salad plate instead of dinner plate 2) Eat a healthy clean diet with avoidance of (less then 1 serving per week) processed foods, sweetened drinks, white starches, red meat, fast foods and sweets and consisting of: * 5-9 servings per day of fresh or frozen fruits and vegetables (not corn or potatoes, not dried or canned) *nuts and seeds, beans *olives and olive oil *small portions of lean meats such as fish and white chicken  *small portions of whole grains 3)Get at least 150 minutes of sweaty aerobic exercise per week 4)reduce stress - counseling, meditation, relaxation to balance other aspects of your life

## 2016-01-05 NOTE — Progress Notes (Signed)
Pre visit review using our clinic review tool, if applicable. No additional management support is needed unless otherwise documented below in the visit note. 

## 2016-01-05 NOTE — Progress Notes (Signed)
HPI:  Richard [name] is a 45 yo with an unfortunate PMH here for follow up. He has a history of noncompliance but has been doing dramatically better recently   DM, with CKD: -meds: asa stopped by cardiology, acei, metformin, glipizide -diet and exercise: cycling and weights 4x per week, eating healthy -eye exam: due -foot exam: today -denies: polyuria, polydipsia, vision changes, wounds on feet -last hgba1c 8.7 09/2015; 4.8 today!!! -lost 28 lbs since last visit!! -wants to be on wall of fame   HTN, CsdCHF, CM, pulm HTN: -managed by his cardiologist -hospitalized 09/2015 -meds: enestro 24-26 bid, lasix recently decreased to 50m daily due to mild increase in cr, spiro 12.5, coreg 9.375 bid, bidil tid -denies: CP, palpitations, swelling, weight gain  ROS: See pertinent positives and negatives per HPI.  Past Medical History:  Diagnosis Date  . Acute decompensated heart failure ([facility] 01/03/2013  . Allergy   . CHF (congestive heart failure) (HCC)    systolic  . Chronic kidney disease   . CKD (chronic kidney disease) stage 2, GFR 60-89 ml/min 01/04/2013  . DM (diabetes mellitus) ([facility] 01/06/2013  . Hypertension    hx htn emergency  . Obesity     Past Surgical History:  Procedure Laterality Date  . LEFT AND RIGHT HEART CATHETERIZATION WITH CORONARY ANGIOGRAM N/A 01/07/2013   Procedure: LEFT AND RIGHT HEART CATHETERIZATION WITH CORONARY ANGIOGRAM;  Surgeon: ABirdie Riddle MD;  [facility];  Service: Cardiovascular;  Laterality: N/A;    Family History  Problem Relation Age of Onset  . Heart disease Father 620   MI  . Hypertension Father   . Hyperlipidemia Father   . Diabetes Mother   . Hypertension Mother     Social History   Social History  . Marital status: Single    Spouse name: N/A  . Number of children: N/A  . Years of education: N/A   Social History Main Topics  . Smoking status: Never Smoker  . Smokeless tobacco: None  . Alcohol use No  . Drug use:  No  . Sexual activity: Not Asked   Other Topics Concern  . None   Social History Narrative   Work or School: pEngineer, agricultural homeless sChief of Staff     Home Situation: lives alone      Spiritual Beliefs: Christian      Lifestyle: 15 minutes dialy walking - working up; working on diet              Current Outpatient Prescriptions:  .  Blood Glucose Monitoring Suppl (ACURA BLOOD GLUCOSE METER) W/DEVICE KIT, 1 meter with lancets and test strips for daily BS testing QS 1 month, Disp: 1 kit, Rfl: 0 .  carvedilol (COREG) 12.5 MG tablet, Take 1.5 tablets (18.75 mg total) by mouth 2 (two) times daily with a meal., Disp: 90 tablet, Rfl: 6 .  furosemide (LASIX) 40 MG tablet, Take 1 tablet (40 mg total) by mouth daily., Disp: 30 tablet, Rfl: 6 .  glipiZIDE (GLUCOTROL) 5 MG tablet, Take 1 tablet (5 mg total) by mouth 2 (two) times daily before a meal., Disp: 120 tablet, Rfl: 3 .  glucose blood (ONE TOUCH ULTRA TEST) test strip, Use as directed to check blood sugar once a day., Disp: 100 each, Rfl: 3 .  isosorbide-hydrALAZINE (BIDIL) 20-37.5 MG tablet, Take 1 tablet by mouth 3 (three) times daily., Disp: 90 tablet, Rfl: 6 .  LevOCARNitine (L-CARNITINE) 250 MG CAPS, Take 250 mg by  mouth daily., Disp: , Rfl:  .  magnesium 30 MG tablet, Take 30 mg by mouth 2 (two) times daily., Disp: , Rfl:  .  metFORMIN (GLUCOPHAGE) 500 MG tablet, Take 1 tablet (500 mg total) by mouth 2 (two) times daily with a meal., Disp: 180 tablet, Rfl: 1 .  Multiple Vitamin (MULTIVITAMIN WITH MINERALS) TABS tablet, Take 1 tablet by mouth daily., Disp: , Rfl:  .  Omega-3 Fatty Acids (FISH OIL) 1000 MG CAPS, Take 1 capsule by mouth daily., Disp: , Rfl:  .  ONETOUCH DELICA LANCETS 38P MISC, 1 Device by Other route daily. Use as directed for glucose testing once a day., Disp: 100 each, Rfl: 3 .  potassium chloride SA (K-DUR,KLOR-CON) 20 MEQ tablet, Take 2 tablets (40 mEq total) by mouth 2 (two) times daily., Disp:  120 tablet, Rfl: 6 .  sacubitril-valsartan (ENTRESTO) 49-51 MG, Take 1 tablet by mouth 2 (two) times daily., Disp: 60 tablet, Rfl: 6 .  spironolactone (ALDACTONE) 25 MG tablet, Take 1 tablet (25 mg total) by mouth daily., Disp: 30 tablet, Rfl: 6  EXAM:  Vitals:   01/05/16 1634  BP: 122/80  Pulse: 87  Temp: 98.4 F (36.9 C)    Body mass index is 39.28 kg/m.  GENERAL: vitals reviewed and listed above, alert, oriented, appears well hydrated and in no acute distress  HEENT: atraumatic, conjunttiva clear, no obvious abnormalities on inspection of external nose and ears  NECK: no obvious masses on inspection  LUNGS: clear to auscultation bilaterally, no wheezes, rales or rhonchi, good air movement  CV: HRRR, no peripheral edema  MS: moves all extremities without noticeable abnormality  PSYCH: pleasant and cooperative, no obvious depression or anxiety  ASSESSMENT AND PLAN:  Discussed the following assessment and plan:  Uncontrolled type 2 diabetes mellitus with stage 3 chronic kidney disease, without long-term current use of insulin (HCC) - Plan: POC HgB A1c  Obesity  Essential hypertension, benign  Chronic combined systolic and diastolic CHF, NYHA class 2 (HCC)  CKD (chronic kidney disease), stage III  -congratulated on changes and success! He wants to be on our wall of fame, will obtain permission next visit. Encouraged to make these LIFEstyle changes for LIFE -will decrease metformin to 554m bid as may no longer need and may help renal function -Patient advised to return or notify a doctor immediately if symptoms worsen or persist or new concerns arise.  Patient Instructions  BEFORE YOU LEAVE: -POC hgba1c -follow up: 3-4 months  Congratulations on the lifestyle changes!   We recommend the following healthy lifestyle for LIFE: 1) Small portions - eat off of salad plate instead of dinner plate 2) Eat a healthy clean diet with avoidance of (less then 1 serving per  week) processed foods, sweetened drinks, white starches, red meat, fast foods and sweets and consisting of: * 5-9 servings per day of fresh or frozen fruits and vegetables (not corn or potatoes, not dried or canned) *nuts and seeds, beans *olives and olive oil *small portions of lean meats such as fish and white chicken  *small portions of whole grains 3)Get at least 150 minutes of sweaty aerobic exercise per week 4)reduce stress - counseling, meditation, relaxation to balance other aspects of your life     KColin BentonR., DO

## 2016-01-09 ENCOUNTER — Ambulatory Visit (HOSPITAL_COMMUNITY)
Admission: RE | Admit: 2016-01-09 | Discharge: 2016-01-09 | Disposition: A | Discharge status: Home / Self Care | Payer: BLUE CROSS/BLUE SHIELD | Source: Ambulatory Visit | Attending: Internal Medicine | Admitting: Internal Medicine

## 2016-01-09 DIAGNOSIS — I5042 Chronic combined systolic (congestive) and diastolic (congestive) heart failure: Secondary | ICD-10-CM

## 2016-01-09 LAB — BASIC METABOLIC PANEL
ANION GAP: 9 (ref 5–15)
BUN: 22 mg/dL — ABNORMAL HIGH (ref 6–20)
CALCIUM: 9.7 mg/dL (ref 8.9–10.3)
CO2: 23 mmol/L (ref 22–32)
Chloride: 107 mmol/L (ref 101–111)
Creatinine, Ser: 1.76 mg/dL — ABNORMAL HIGH (ref 0.61–1.24)
GFR, EST AFRICAN AMERICAN: 52 mL/min — AB (ref >60)
GFR, EST NON AFRICAN AMERICAN: 45 mL/min — AB (ref >60)
Glucose, Bld: 89 mg/dL (ref 65–99)
Potassium: 4.6 mmol/L (ref 3.5–5.1)
Sodium: 139 mmol/L (ref 135–145)

## 2016-02-14 ENCOUNTER — Ambulatory Visit (HOSPITAL_COMMUNITY)
Admission: RE | Admit: 2016-02-14 | Discharge: 2016-02-14 | Disposition: A | Discharge status: Home / Self Care | Payer: BLUE CROSS/BLUE SHIELD | Source: Ambulatory Visit | Attending: Cardiology | Admitting: Cardiology

## 2016-02-14 VITALS — BP 126/64 | HR 86 | Wt 276.6 lb

## 2016-02-14 DIAGNOSIS — Z79899 Other long term (current) drug therapy: Secondary | ICD-10-CM | POA: Insufficient documentation

## 2016-02-14 DIAGNOSIS — E669 Obesity, unspecified: Secondary | ICD-10-CM | POA: Insufficient documentation

## 2016-02-14 DIAGNOSIS — N183 Chronic kidney disease, stage 3 unspecified: Secondary | ICD-10-CM

## 2016-02-14 DIAGNOSIS — I5022 Chronic systolic (congestive) heart failure: Secondary | ICD-10-CM | POA: Diagnosis present

## 2016-02-14 DIAGNOSIS — I130 Hypertensive heart and chronic kidney disease with heart failure and stage 1 through stage 4 chronic kidney disease, or unspecified chronic kidney disease: Secondary | ICD-10-CM | POA: Diagnosis not present

## 2016-02-14 DIAGNOSIS — Z7984 Long term (current) use of oral hypoglycemic drugs: Secondary | ICD-10-CM | POA: Diagnosis not present

## 2016-02-14 DIAGNOSIS — E1122 Type 2 diabetes mellitus with diabetic chronic kidney disease: Secondary | ICD-10-CM | POA: Insufficient documentation

## 2016-02-14 DIAGNOSIS — I10 Essential (primary) hypertension: Secondary | ICD-10-CM

## 2016-02-14 DIAGNOSIS — I5042 Chronic combined systolic (congestive) and diastolic (congestive) heart failure: Secondary | ICD-10-CM

## 2016-02-14 DIAGNOSIS — I429 Cardiomyopathy, unspecified: Secondary | ICD-10-CM | POA: Diagnosis not present

## 2016-02-14 MED ORDER — POTASSIUM CHLORIDE CRYS ER 20 MEQ PO TBCR: 40.0000 meq | EXTENDED_RELEASE_TABLET | Freq: Every day | ORAL | 6 refills | Status: DC

## 2016-02-14 MED ORDER — CARVEDILOL 25 MG PO TABS: 25.0000 mg | ORAL_TABLET | Freq: Two times a day (BID) | ORAL | 6 refills | Status: DC

## 2016-02-14 NOTE — Patient Instructions (Signed)
INCREASE Coreg to 25 mg, one tab twice a day DECREASE Potassium to 40 meq (2 tabs) daily  Your physician has requested that you have an echocardiogram. Echocardiography is a painless test that uses sound waves to create images of your heart. It provides your doctor with information about the size and shape of your heart and how well your heart's chambers and valves are working. This procedure takes approximately one hour. There are no restrictions for this procedure.  Your physician recommends that you schedule a follow-up appointment in: 4 weeks with Dr Aundra Dubin and echo  Do the following things EVERYDAY: 1) Weigh yourself in the morning before breakfast. Write it down and keep it in a log. 2) Take your medicines as prescribed 3) Eat low salt foods-Limit salt (sodium) to 2000 mg per day.  4) Stay as active as you can everyday 5) Limit all fluids for the day to less than 2 liters

## 2016-02-14 NOTE — Progress Notes (Signed)
Patient ID: Richard [name], male   DOB: [birth year], 45 y.o.   MRN: [id number]    Advanced Heart Failure Clinic Note    Primary Care: Colin Benton, DO Cardiology:  Dr. Aundra Dubin   HPI: Richard [name] is a 45 y.o. male with a past medical history that includes diabetes, chronic combined heart failure, hypertension, chronic kidney disease stage II.  Admitted from [facility] 09/26/15 with worsening SOB and LE edema. He reported medical non-compliance on his lasix regimen. Diuresed well, Down 14 lbs with discharge weight of 317 lbs.   Echo 09/27/2015 20-25%, grade 1 DD, RV mild dilated, moderately reduced, severe RAE.   He presents today for HF follow  Up. Last visit carvedilol was increased to 18.75 mg twice a day. Overall feeling great. Denies SOB/PND/Orthopnea. Able to ride stationary bike 20 minutes every other day. Taking all medications. Working 2 jobs.   ECG (4/17): NSR, narrow QRS, lateral TWIs  Labs (4/17): K 3.9, creatinine 1.61 Labs (7/17): K 4.3, creatinine 1.78 Labs (01/09/2016) : K 4.6 Creatinine 1.76   ROS: All systems reviewed and negative except as per HPI.   PMH: 1. Chronic systolic CHF: Nonischemic CMP.  Likely due to poorly controlled HTN. HIV negative, SPEP negative, no M spike. UPEP negative. - Echo (2014): EF 20-25%.  - LHC (2014) with no significant CAD. - Echo (4/17) with EF 20-25%, RV mildly dilated, RV systolic function moderately reduced, severe LAE  2. HTN 3. CKD stage III 4. Obesity 5. Type II diabetes  Current Outpatient Prescriptions  Medication Sig Dispense Refill  . Blood Glucose Monitoring Suppl (ACURA BLOOD GLUCOSE METER) W/DEVICE KIT 1 meter with lancets and test strips for daily BS testing QS 1 month 1 kit 0  . carvedilol (COREG) 12.5 MG tablet Take 1.5 tablets (18.75 mg total) by mouth 2 (two) times daily with a meal. 90 tablet 6  . furosemide (LASIX) 40 MG tablet Take 1 tablet (40 mg total) by mouth daily. 30 tablet 6  . glipiZIDE (GLUCOTROL) 5 MG tablet  Take 1 tablet (5 mg total) by mouth 2 (two) times daily before a meal. 120 tablet 3  . glucose blood (ONE TOUCH ULTRA TEST) test strip Use as directed to check blood sugar once a day. 100 each 3  . isosorbide-hydrALAZINE (BIDIL) 20-37.5 MG tablet Take 1 tablet by mouth 3 (three) times daily. 90 tablet 6  . LevOCARNitine (L-CARNITINE) 250 MG CAPS Take 250 mg by mouth daily.    . magnesium 30 MG tablet Take 30 mg by mouth 2 (two) times daily.    . metFORMIN (GLUCOPHAGE) 500 MG tablet Take 1 tablet (500 mg total) by mouth 2 (two) times daily with a meal. 180 tablet 1  . Multiple Vitamin (MULTIVITAMIN WITH MINERALS) TABS tablet Take 1 tablet by mouth daily.    . Omega-3 Fatty Acids (FISH OIL) 1000 MG CAPS Take 1 capsule by mouth daily.    Glory Rosebush DELICA LANCETS 74J MISC 1 Device by Other route daily. Use as directed for glucose testing once a day. 100 each 3  . potassium chloride SA (K-DUR,KLOR-CON) 20 MEQ tablet Take 2 tablets (40 mEq total) by mouth 2 (two) times daily. 120 tablet 6  . sacubitril-valsartan (ENTRESTO) 49-51 MG Take 1 tablet by mouth 2 (two) times daily. 60 tablet 6  . spironolactone (ALDACTONE) 25 MG tablet Take 1 tablet (25 mg total) by mouth daily. 30 tablet 6   No current facility-administered medications for this encounter.     No  Known Allergies    Social History   Social History  . Marital status: Single    Spouse name: N/A  . Number of children: N/A  . Years of education: N/A   Occupational History  . Not on file.   Social History Main Topics  . Smoking status: Never Smoker  . Smokeless tobacco: Not on file  . Alcohol use No  . Drug use: No  . Sexual activity: Not on file   Other Topics Concern  . 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 Heart disease Father 35    MI  . Hypertension Father   . Hyperlipidemia Father   . Diabetes Mother   . Hypertension Mother     Vitals:   02/14/16 1454  BP: 126/64  Pulse: 86  SpO2: 96%  Weight: 276 lb 9.6 oz (125.5 kg)   Wt Readings from Last 3 Encounters:  02/14/16 276 lb 9.6 oz (125.5 kg)  01/05/16 281 lb 9.6 oz (127.7 kg)  01/03/16 285 lb 12 oz (129.6 kg)     PHYSICAL EXAM: General:  Well appearing. No respiratory difficulty HEENT: normal Neck: supple. JVD 5-6. Carotids 2+ bilat; no bruits. No thyromegaly or nodule noted.  Cor: PMI nondisplaced. RRR. No M/G/R Lungs: Clear Abdomen: soft, non-tender, non-distended, no HSM. No bruits or masses. +BS  Extremities: no cyanosis, clubbing, rash, edema Neuro: alert & oriented x 3, cranial nerves grossly intact. moves all 4 extremities w/o difficulty. Affect pleasant.  ASSESSMENT & PLAN:   1. Chronic systolic CHF:  Echo 0/34/9179 EF 20-25%, RV mild dilated with moderately reduced systolic function, severe LAE. Nonischemic cardiomyopathy, likely 2/2 poorly controlled HTN. HIV negative, SPEP negative, no M spike. UPEP negative.  Had [place] in 2014, negative for CAD. EF was 25-30% at that time.   NYHA class I symptoms. Volume status stable. - Continue Lasix 40 mg daily. Cut back potassium to 40 meq daily.   - Continue Entresto 49/51 bid.  - Continue spironolactone 25 daily. - Increase Coreg to 25 mg bid.  - Continue Bidil 1 tab TID.  - Repeat echo in 10/17. If EF remains < 35% will need to consider ICD. He is not sure he would like to purse.  2. HTN: BP controlled.  3. CKD: Stage III. Creatinine 1.76 on 01/09/2016   Suspect hypertensive nephropathy. Instructed to avoid NSAIDs 4. DM2: Followed by PCP.     Follow up in 4 weeks with an ECHO and Dr Aundra Dubin. Check BMET next visit.    Brees Hounshell NP-C  02/14/2016

## 2016-02-14 NOTE — Progress Notes (Signed)
Advanced Heart Failure Medication Review by a Pharmacist  Does the patient  feel that his/her medications are working for him/her?  yes  Has the patient been experiencing any side effects to the medications prescribed?  no  Does the patient measure his/her own blood pressure or blood glucose at home?  no   Does the patient have any problems obtaining medications due to transportation or finances?   no  Understanding of regimen: good Understanding of indications: good Potential of compliance: good Patient understands to avoid NSAIDs. Patient understands to avoid decongestants.  Issues to address at subsequent visits: None   Pharmacist comments:  Richard [name] is a pleasant 45 yo M presenting without a medication list. He reports good compliance with his regimen. He did ask for recommendations on OTC cough/cold medications safe for him to take and I have provided him with our informational handout.   [name]. [name], PharmD, BCPS, CPP Clinical Pharmacist Pager: [phone] Phone: [phone] 02/14/2016 3:11 PM      Time with patient: 10 minutes Preparation and documentation time: 2 minutes Total time: 12 minutes

## 2016-02-21 ENCOUNTER — Telehealth (HOSPITAL_COMMUNITY): Payer: Self-pay | Admitting: Pharmacist

## 2016-02-21 NOTE — Telephone Encounter (Signed)
Mr. Gieselman left a VM on my pharmacy line but was cutting in and out and could not hear what he wanted. Called him and left VM to call back.   [name]. [name], PharmD, BCPS, CPP Clinical Pharmacist Pager: [phone] Phone: [phone] 02/21/2016 10:36 AM

## 2016-03-15 ENCOUNTER — Ambulatory Visit (HOSPITAL_COMMUNITY)
Admission: RE | Admit: 2016-03-15 | Discharge: 2016-03-15 | Disposition: A | Discharge status: Home / Self Care | Payer: BLUE CROSS/BLUE SHIELD | Source: Ambulatory Visit | Attending: Family Medicine | Admitting: Family Medicine

## 2016-03-15 ENCOUNTER — Encounter (HOSPITAL_COMMUNITY): Payer: Self-pay

## 2016-03-15 ENCOUNTER — Ambulatory Visit (HOSPITAL_BASED_OUTPATIENT_CLINIC_OR_DEPARTMENT_OTHER)
Admission: RE | Admit: 2016-03-15 | Discharge: 2016-03-15 | Disposition: A | Discharge status: Home / Self Care | Payer: BLUE CROSS/BLUE SHIELD | Source: Ambulatory Visit | Attending: Cardiology | Admitting: Cardiology

## 2016-03-15 VITALS — BP 136/90 | HR 64 | Wt 278.0 lb

## 2016-03-15 DIAGNOSIS — I5042 Chronic combined systolic (congestive) and diastolic (congestive) heart failure: Secondary | ICD-10-CM

## 2016-03-15 DIAGNOSIS — E1122 Type 2 diabetes mellitus with diabetic chronic kidney disease: Secondary | ICD-10-CM | POA: Diagnosis not present

## 2016-03-15 DIAGNOSIS — N183 Chronic kidney disease, stage 3 (moderate): Secondary | ICD-10-CM | POA: Diagnosis not present

## 2016-03-15 DIAGNOSIS — Z7984 Long term (current) use of oral hypoglycemic drugs: Secondary | ICD-10-CM | POA: Insufficient documentation

## 2016-03-15 DIAGNOSIS — I10 Essential (primary) hypertension: Secondary | ICD-10-CM | POA: Diagnosis not present

## 2016-03-15 DIAGNOSIS — I5022 Chronic systolic (congestive) heart failure: Secondary | ICD-10-CM | POA: Diagnosis present

## 2016-03-15 DIAGNOSIS — Z6838 Body mass index (BMI) 38.0-38.9, adult: Secondary | ICD-10-CM | POA: Diagnosis not present

## 2016-03-15 DIAGNOSIS — E669 Obesity, unspecified: Secondary | ICD-10-CM | POA: Insufficient documentation

## 2016-03-15 DIAGNOSIS — Z79899 Other long term (current) drug therapy: Secondary | ICD-10-CM | POA: Diagnosis not present

## 2016-03-15 DIAGNOSIS — I130 Hypertensive heart and chronic kidney disease with heart failure and stage 1 through stage 4 chronic kidney disease, or unspecified chronic kidney disease: Secondary | ICD-10-CM | POA: Diagnosis present

## 2016-03-15 LAB — BASIC METABOLIC PANEL
Anion gap: 9 (ref 5–15)
BUN: 19 mg/dL (ref 6–20)
CALCIUM: 9.3 mg/dL (ref 8.9–10.3)
CHLORIDE: 103 mmol/L (ref 101–111)
CO2: 25 mmol/L (ref 22–32)
CREATININE: 1.97 mg/dL — AB (ref 0.61–1.24)
GFR, EST AFRICAN AMERICAN: 46 mL/min — AB (ref >60)
GFR, EST NON AFRICAN AMERICAN: 39 mL/min — AB (ref >60)
Glucose, Bld: 91 mg/dL (ref 65–99)
Potassium: 3.9 mmol/L (ref 3.5–5.1)
SODIUM: 137 mmol/L (ref 135–145)

## 2016-03-15 NOTE — Patient Instructions (Signed)
Labs today We will only contact you if something comes back abnormal or we need to make some changes. Otherwise no news is good news!   Your physician recommends that you schedule a follow-up appointment in: 3 months for labs and in 6 months with Dr Aundra Dubin  Do the following things EVERYDAY: 1) Weigh yourself in the morning before breakfast. Write it down and keep it in a log. 2) Take your medicines as prescribed 3) Eat low salt foods-Limit salt (sodium) to 2000 mg per day.  4) Stay as active as you can everyday 5) Limit all fluids for the day to less than 2 liters

## 2016-03-15 NOTE — Progress Notes (Signed)
Patient ID: Richard [name], male   DOB: 02-Oct-1970, 45 y.o.   MRN: [id number]    Advanced Heart Failure Clinic Note    Primary Care: Colin Benton, DO Cardiology:  Dr. Aundra Dubin   HPI: Richard [name] is a 45 y.o. male with a past medical history that includes diabetes, chronic combined heart failure, hypertension, chronic kidney disease stage II.  Admitted from [facility] 09/26/15 with worsening SOB and LE edema. He reported medical non-compliance on his lasix regimen. Diuresed well, Down 14 lbs with discharge weight of 317 lbs.   Echo 09/27/2015 20-25%, grade 1 DD, RV mild dilated, moderately reduced, severe RAE.   He presents today for HF follow up. Last visit increase coreg up to 25 mg BID.  Denies fatigue or lightheadedness. Walks as far as he wants on flat ground. Does stair master for 20-25 minutes without DOE.  Overall PND/Orthopnea/Palpitations. Taking all medications. 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  Echo today shows EF improved to 50-55%.   Labs (4/17): K 3.9, creatinine 1.61 Labs (7/17): K 4.3, creatinine 1.78 Labs (01/09/2016) : K 4.6 Creatinine 1.76   ROS: All systems reviewed and negative except as per HPI.   PMH: 1. Chronic systolic CHF: Nonischemic CMP.  Likely due to poorly controlled HTN. HIV negative, SPEP negative, no M spike. UPEP negative. - Echo (2014): EF 20-25%.  - LHC (2014) with no significant CAD. - Echo (4/17) with EF 20-25%, RV mildly dilated, RV systolic function moderately reduced, severe LAE  - Echo (10/17) with EF 50-55%, normal RV size and systolic function.  2. HTN 3. CKD stage III 4. Obesity 5. Type II diabetes  Current Outpatient Prescriptions  Medication Sig Dispense Refill  . Blood Glucose Monitoring Suppl (ACURA BLOOD GLUCOSE METER) W/DEVICE KIT 1 meter with lancets and test strips for daily BS testing QS 1 month 1 kit 0  . carvedilol (COREG) 25 MG tablet Take 1 tablet (25 mg total) by mouth 2 (two) times  daily with a meal. 60 tablet 6  . furosemide (LASIX) 40 MG tablet Take 1 tablet (40 mg total) by mouth daily. 30 tablet 6  . glipiZIDE (GLUCOTROL) 5 MG tablet Take 1 tablet (5 mg total) by mouth 2 (two) times daily before a meal. 120 tablet 3  . glucose blood (ONE TOUCH ULTRA TEST) test strip Use as directed to check blood sugar once a day. 100 each 3  . isosorbide-hydrALAZINE (BIDIL) 20-37.5 MG tablet Take 1 tablet by mouth 3 (three) times daily. 90 tablet 6  . LevOCARNitine (L-CARNITINE) 250 MG CAPS Take 250 mg by mouth daily.    . magnesium 30 MG tablet Take 30 mg by mouth 2 (two) times daily.    . metFORMIN (GLUCOPHAGE) 500 MG tablet Take 1 tablet (500 mg total) by mouth 2 (two) times daily with a meal. 180 tablet 1  . Multiple Vitamin (MULTIVITAMIN WITH MINERALS) TABS tablet Take 1 tablet by mouth daily.    . Omega-3 Fatty Acids (FISH OIL) 1000 MG CAPS Take 1 capsule by mouth daily.    Glory Rosebush DELICA LANCETS 72C MISC 1 Device by Other route daily. Use as directed for glucose testing once a day. 100 each 3  . potassium chloride SA (K-DUR,KLOR-CON) 20 MEQ tablet Take 2 tablets (40 mEq total) by mouth daily. 60 tablet 6  . sacubitril-valsartan (ENTRESTO) 49-51 MG Take 1 tablet by mouth 2 (two) times daily. 60 tablet 6  . spironolactone (ALDACTONE) 25 MG  tablet Take 1 tablet (25 mg total) by mouth daily. 30 tablet 6   No current facility-administered medications for this encounter.     No Known Allergies    Social History   Social History  . Marital status: Single    Spouse name: N/A  . Number of children: N/A  . Years of education: N/A   Occupational History  . Not on file.   Social History Main Topics  . Smoking status: Never Smoker  . Smokeless tobacco: Not on file  . Alcohol use No  . Drug use: No  . Sexual activity: Not on file   Other Topics Concern  . Not on file   Social History Narrative   Work or School: Engineer, agricultural, homeless Chief of Staff       Home Situation: lives alone      Spiritual Beliefs: Christian      Lifestyle: 15 minutes dialy walking - working up; working on diet               Family History  Problem Relation Age of Onset  . Heart disease Father 31    MI  . Hypertension Father   . Hyperlipidemia Father   . Diabetes Mother   . Hypertension Mother     Vitals:   03/15/16 1423  BP: 136/90  Pulse: 64  SpO2: 100%  Weight: 278 lb (126.1 kg)   Wt Readings from Last 3 Encounters:  03/15/16 278 lb (126.1 kg)  02/14/16 276 lb 9.6 oz (125.5 kg)  01/05/16 281 lb 9.6 oz (127.7 kg)     PHYSICAL EXAM: General:  Well appearing. No respiratory difficulty HEENT: normal Neck: supple. JVD not elevated. Carotids 2+ bilat; no bruits. No thyromegaly or nodule noted.  Cor: PMI nondisplaced. RRR. No M/G/R.  Lungs: CTAB, normal effort.  Abdomen: soft, NT, ND, no HSM. No bruits or masses. +BS  Extremities: no cyanosis, clubbing, rash. No edema Neuro: alert & oriented x 3, cranial nerves grossly intact. moves all 4 extremities w/o difficulty. Affect pleasant.  ASSESSMENT & PLAN:   1. Chronic systolic CHF:  Echo 4/88/8916 EF 20-25%, RV mild dilated with moderately reduced systolic function, severe LAE. Nonischemic cardiomyopathy, likely 2/2 poorly controlled HTN. HIV negative, SPEP negative, no M spike. UPEP negative.  Had [place] in 2014, negative for CAD. EF was 25-30% at that time. Echo today with EF 50-55%, normal RV. NYHA class I symptoms. Volume status stable. - Continue Lasix 40 mg daily and potassium 40 meq daily.  BMET today. - Continue Entresto 49/51 bid.  - Continue spironolactone 25 daily. - Continue Coreg 25 mg bid.  - Continue Bidil 1 tab TID.  - EF improved, will not need ICD.  2. HTN: BP controlled on current regimen.  3. CKD: Stage III. Creatinine 1.76 on 01/09/2016   Suspect hypertensive nephropathy. Instructed to avoid NSAIDs 4. DM2: Followed by PCP.   BMET today. Follow up in 6 months. Will repeat  BMET in 3 months with Entresto + spironolactone.   Satira Mccallum Tillery PA-C 03/15/2016   Patient seen with PA, agree with the above note.  I reviewed today's echo, EF up to 50-55%.  He will not need ICD.  Suspect improved EF in setting of good BP control (probably hypertensive cardiomyopathy).   Followup in 6 months but will get BMET in 3 months.  Continue all current meds.   [name] 03/18/2016

## 2016-03-15 NOTE — Progress Notes (Signed)
*  PRELIMINARY RESULTS* Echocardiogram 2D Echocardiogram has been performed.  Richard [name], [name] 03/15/2016, 2:03 PM

## 2016-03-27 ENCOUNTER — Telehealth (HOSPITAL_COMMUNITY): Payer: Self-pay

## 2016-03-27 NOTE — Telephone Encounter (Signed)
ECHOCARDIOGRAM COMPLETE  Order: [id number]  Status:  Final result Visible to patient:  Yes (MyChart) Dx:  Chronic combined systolic and diastol...  Notes Recorded by Effie Berkshire, RN on 03/27/2016 at 10:12 AM EDT Left confidential VM with results and return callback number if needed ------  Notes Recorded by Conrad Williams, NP on 03/16/2016 at 1:08 PM EDT EF improved from 25% to 50-55%. Please call.

## 2016-04-12 ENCOUNTER — Ambulatory Visit: Payer: BLUE CROSS/BLUE SHIELD | Admitting: Family Medicine

## 2016-05-26 ENCOUNTER — Other Ambulatory Visit: Payer: Self-pay | Admitting: Family Medicine

## 2016-05-29 ENCOUNTER — Other Ambulatory Visit: Payer: Self-pay | Admitting: Emergency Medicine

## 2016-06-15 ENCOUNTER — Ambulatory Visit (HOSPITAL_COMMUNITY)
Admission: RE | Admit: 2016-06-15 | Discharge: 2016-06-15 | Disposition: A | Discharge status: Home / Self Care | Payer: 59 | Source: Ambulatory Visit | Attending: Internal Medicine | Admitting: Internal Medicine

## 2016-06-15 DIAGNOSIS — I5042 Chronic combined systolic (congestive) and diastolic (congestive) heart failure: Secondary | ICD-10-CM

## 2016-06-15 LAB — BASIC METABOLIC PANEL
ANION GAP: 10 (ref 5–15)
BUN: 22 mg/dL — AB (ref 6–20)
CHLORIDE: 102 mmol/L (ref 101–111)
CO2: 24 mmol/L (ref 22–32)
Calcium: 9.4 mg/dL (ref 8.9–10.3)
Creatinine, Ser: 1.83 mg/dL — ABNORMAL HIGH (ref 0.61–1.24)
GFR, EST AFRICAN AMERICAN: 49 mL/min — AB (ref >60)
GFR, EST NON AFRICAN AMERICAN: 43 mL/min — AB (ref >60)
Glucose, Bld: 127 mg/dL — ABNORMAL HIGH (ref 65–99)
POTASSIUM: 4.4 mmol/L (ref 3.5–5.1)
SODIUM: 136 mmol/L (ref 135–145)

## 2016-06-18 ENCOUNTER — Ambulatory Visit: Payer: BLUE CROSS/BLUE SHIELD | Admitting: Family Medicine

## 2016-07-19 ENCOUNTER — Telehealth: Payer: Self-pay | Admitting: Family Medicine

## 2016-07-19 MED ORDER — METFORMIN HCL 500 MG PO TABS: 500.0000 mg | ORAL_TABLET | Freq: Two times a day (BID) | ORAL | 0 refills | Status: DC

## 2016-07-19 NOTE — Telephone Encounter (Signed)
Rx done. 

## 2016-07-19 NOTE — Telephone Encounter (Signed)
Pt need new Rx for metformin  Pharm:  [facility]

## 2016-07-20 ENCOUNTER — Other Ambulatory Visit (HOSPITAL_COMMUNITY): Payer: Self-pay | Admitting: Pharmacist

## 2016-07-20 ENCOUNTER — Telehealth (HOSPITAL_COMMUNITY): Payer: Self-pay | Admitting: Pharmacist

## 2016-07-20 MED ORDER — ISOSORB DINITRATE-HYDRALAZINE 20-37.5 MG PO TABS: 1.0000 | ORAL_TABLET | Freq: Three times a day (TID) | ORAL | 6 refills | Status: DC

## 2016-07-20 NOTE — Telephone Encounter (Signed)
Patient called stating Bidil too expensive with new insurance. It does not require a PA through [place] and since it is commercial he can use the Bidil copay card. Left VM to call back to relay this info.   [name]. [name], PharmD, BCPS, CPP Clinical Pharmacist Pager: [phone] Phone: [phone] 07/20/2016 3:12 PM

## 2016-07-22 ENCOUNTER — Other Ambulatory Visit: Payer: Self-pay | Admitting: Family Medicine

## 2016-11-29 ENCOUNTER — Telehealth (HOSPITAL_COMMUNITY): Payer: Self-pay | Admitting: Pharmacist

## 2016-11-29 NOTE — Telephone Encounter (Signed)
PA started for [facility] and faxed to [facility]. Patient called and states that he will run out of entresto Sunday. Already used 30-day free card. Samples provided and left at front desk.   Medication Samples have been provided to the patient.  Drug name: Entresto       Strength: 49-51mg         Qty: 14  LOT: [id number]  Exp.Date: 03/2018 Dosing instructions: take 1 tablet by mouth twice daily  The patient has been instructed regarding the correct time, dose, and frequency of taking this medication, including desired effects and most common side effects.   Carlean Jews, Pharm.D. PGY1 Pharmacy Resident 6/28/20183:24 PM Pager [phone]

## 2016-11-30 ENCOUNTER — Telehealth (HOSPITAL_COMMUNITY): Payer: Self-pay

## 2016-11-30 NOTE — Telephone Encounter (Signed)
PA approved by [place] F[place] for [place] on 11/29/2016 through 11/29/2017. Called and notified patient of this approval so that he can obtain this medication through his pharmacy.   Phillis Knack, PharmD Candidate

## 2016-12-07 ENCOUNTER — Other Ambulatory Visit: Payer: Self-pay | Admitting: Family Medicine

## 2016-12-07 MED ORDER — METFORMIN HCL 500 MG PO TABS: 500.0000 mg | ORAL_TABLET | Freq: Two times a day (BID) | ORAL | 0 refills | Status: DC

## 2016-12-07 NOTE — Telephone Encounter (Signed)
Rx done and the pt was notified via Mychart message. 

## 2016-12-27 ENCOUNTER — Telehealth (HOSPITAL_COMMUNITY): Payer: Self-pay

## 2016-12-27 NOTE — Telephone Encounter (Signed)
Patient requesting bidil samples. States his copay card usually mad his prescription $30 but it is not working any longer. Will provide 1 month of samples and give patient new copay card to try. Advised to call our office if he is not able to use new copay card or if his pharmacy can give [place] more details as to what can be done for him to use a copay card.  Renee Pain, RN

## 2017-01-03 MED ORDER — ISOSORB DINITRATE-HYDRALAZINE 20-37.5 MG PO TABS: 1.0000 | ORAL_TABLET | Freq: Three times a day (TID) | ORAL | 11 refills | Status: DC

## 2017-01-03 NOTE — Telephone Encounter (Signed)
Mr. Blankenhorn would like his Bidil sent to [facility] for assistance with his copays. Rx sent and advised Mr. Hobbins to call back with any further issues.   [name]. [name], PharmD, BCPS, CPP Clinical Pharmacist Pager: [phone] Phone: [phone] 01/03/2017 3:08 PM

## 2017-01-03 NOTE — Addendum Note (Signed)
Addended by: Adora Fridge on: 01/03/2017 03:09 PM   Modules accepted: Orders

## 2017-01-18 ENCOUNTER — Other Ambulatory Visit: Payer: Self-pay | Admitting: Family Medicine

## 2017-01-18 MED ORDER — METFORMIN HCL 500 MG PO TABS: 500.0000 mg | ORAL_TABLET | Freq: Two times a day (BID) | ORAL | 0 refills | Status: DC

## 2017-01-18 NOTE — Telephone Encounter (Signed)
Rx done. 

## 2017-01-19 ENCOUNTER — Other Ambulatory Visit (HOSPITAL_COMMUNITY): Payer: Self-pay | Admitting: Cardiology

## 2017-01-23 ENCOUNTER — Other Ambulatory Visit (HOSPITAL_COMMUNITY): Payer: Self-pay | Admitting: Cardiology

## 2017-01-23 MED ORDER — SPIRONOLACTONE 25 MG PO TABS: 25.0000 mg | ORAL_TABLET | Freq: Every day | ORAL | 6 refills | Status: DC

## 2017-01-23 MED ORDER — FUROSEMIDE 40 MG PO TABS: 40.0000 mg | ORAL_TABLET | Freq: Every day | ORAL | 6 refills | Status: DC

## 2017-01-23 NOTE — Telephone Encounter (Signed)
Refill line request

## 2017-01-28 ENCOUNTER — Ambulatory Visit (HOSPITAL_COMMUNITY)
Admission: RE | Admit: 2017-01-28 | Discharge: 2017-01-28 | Disposition: A | Discharge status: Home / Self Care | Payer: 59 | Source: Ambulatory Visit | Attending: Internal Medicine | Admitting: Internal Medicine

## 2017-01-28 VITALS — BP 166/100 | HR 84 | Wt 315.0 lb

## 2017-01-28 DIAGNOSIS — Z8249 Family history of ischemic heart disease and other diseases of the circulatory system: Secondary | ICD-10-CM | POA: Insufficient documentation

## 2017-01-28 DIAGNOSIS — N183 Chronic kidney disease, stage 3 unspecified: Secondary | ICD-10-CM

## 2017-01-28 DIAGNOSIS — IMO0002 Reserved for concepts with insufficient information to code with codable children: Secondary | ICD-10-CM

## 2017-01-28 DIAGNOSIS — I5042 Chronic combined systolic (congestive) and diastolic (congestive) heart failure: Secondary | ICD-10-CM

## 2017-01-28 DIAGNOSIS — E669 Obesity, unspecified: Secondary | ICD-10-CM | POA: Diagnosis not present

## 2017-01-28 DIAGNOSIS — Z9119 Patient's noncompliance with other medical treatment and regimen: Secondary | ICD-10-CM | POA: Diagnosis not present

## 2017-01-28 DIAGNOSIS — E1122 Type 2 diabetes mellitus with diabetic chronic kidney disease: Secondary | ICD-10-CM | POA: Insufficient documentation

## 2017-01-28 DIAGNOSIS — I130 Hypertensive heart and chronic kidney disease with heart failure and stage 1 through stage 4 chronic kidney disease, or unspecified chronic kidney disease: Secondary | ICD-10-CM | POA: Insufficient documentation

## 2017-01-28 DIAGNOSIS — I5022 Chronic systolic (congestive) heart failure: Secondary | ICD-10-CM | POA: Diagnosis present

## 2017-01-28 DIAGNOSIS — E6609 Other obesity due to excess calories: Secondary | ICD-10-CM

## 2017-01-28 DIAGNOSIS — Z6841 Body Mass Index (BMI) 40.0 and over, adult: Secondary | ICD-10-CM | POA: Diagnosis not present

## 2017-01-28 DIAGNOSIS — I10 Essential (primary) hypertension: Secondary | ICD-10-CM

## 2017-01-28 DIAGNOSIS — E1165 Type 2 diabetes mellitus with hyperglycemia: Secondary | ICD-10-CM | POA: Diagnosis not present

## 2017-01-28 DIAGNOSIS — I429 Cardiomyopathy, unspecified: Secondary | ICD-10-CM | POA: Insufficient documentation

## 2017-01-28 DIAGNOSIS — Z833 Family history of diabetes mellitus: Secondary | ICD-10-CM | POA: Diagnosis not present

## 2017-01-28 DIAGNOSIS — Z7984 Long term (current) use of oral hypoglycemic drugs: Secondary | ICD-10-CM | POA: Insufficient documentation

## 2017-01-28 LAB — CBC
HCT: 41.5 % (ref 39.0–52.0)
Hemoglobin: 13.4 g/dL (ref 13.0–17.0)
MCH: 28.9 pg (ref 26.0–34.0)
MCHC: 32.3 g/dL (ref 30.0–36.0)
MCV: 89.6 fL (ref 78.0–100.0)
PLATELETS: 177 10*3/uL (ref 150–400)
RBC: 4.63 MIL/uL (ref 4.22–5.81)
RDW: 12.9 % (ref 11.5–15.5)
WBC: 5.8 10*3/uL (ref 4.0–10.5)

## 2017-01-28 LAB — BASIC METABOLIC PANEL
Anion gap: 14 (ref 5–15)
BUN: 16 mg/dL (ref 6–20)
CHLORIDE: 103 mmol/L (ref 101–111)
CO2: 24 mmol/L (ref 22–32)
CREATININE: 1.81 mg/dL — AB (ref 0.61–1.24)
Calcium: 9 mg/dL (ref 8.9–10.3)
GFR calc Af Amer: 50 mL/min — ABNORMAL LOW (ref >60)
GFR calc non Af Amer: 43 mL/min — ABNORMAL LOW (ref >60)
Glucose, Bld: 109 mg/dL — ABNORMAL HIGH (ref 65–99)
Potassium: 3.5 mmol/L (ref 3.5–5.1)
Sodium: 141 mmol/L (ref 135–145)

## 2017-01-28 MED ORDER — SACUBITRIL-VALSARTAN 49-51 MG PO TABS: 1.0000 | ORAL_TABLET | Freq: Two times a day (BID) | ORAL | 6 refills | Status: DC

## 2017-01-28 MED ORDER — SPIRONOLACTONE 25 MG PO TABS: 25.0000 mg | ORAL_TABLET | Freq: Every day | ORAL | 6 refills | Status: DC

## 2017-01-28 MED ORDER — POTASSIUM CHLORIDE CRYS ER 20 MEQ PO TBCR: 40.0000 meq | EXTENDED_RELEASE_TABLET | Freq: Every day | ORAL | 6 refills | Status: DC

## 2017-01-28 MED ORDER — ISOSORB DINITRATE-HYDRALAZINE 20-37.5 MG PO TABS
2.0000 | ORAL_TABLET | Freq: Three times a day (TID) | ORAL | 11 refills | Status: DC
Start: 2017-01-28 — End: 2019-05-22

## 2017-01-28 MED ORDER — FUROSEMIDE 40 MG PO TABS: 40.0000 mg | ORAL_TABLET | Freq: Every day | ORAL | 6 refills | Status: DC

## 2017-01-28 MED ORDER — CARVEDILOL 25 MG PO TABS: 25.0000 mg | ORAL_TABLET | Freq: Two times a day (BID) | ORAL | 6 refills | Status: DC

## 2017-01-28 NOTE — Progress Notes (Signed)
Patient ID: Richard [name], male   DOB: 04/07/1971, 45 y.o.   MRN: [id number]    Advanced Heart Failure Clinic Note    Primary Care: Hannah Kim, DO Cardiology:  Dr. McLean   HPI: Richard [name] is a 45 y.o. male with a past medical history that includes diabetes, chronic combined heart failure, hypertension, chronic kidney disease stage II.  Admitted from [facility] 09/26/15 with worsening SOB and LE edema. He reported medical non-compliance on his lasix regimen. Diuresed well, Down 14 lbs with discharge weight of 317 lbs.   Echo 09/27/2015 20-25%, grade 1 DD, RV mild dilated, moderately reduced, severe RAE.  Echo 03/2016 50-55%, grade 1 DD, RV ok.   He presents today for HF follow up. No SOB with walking around in the neighborhood, walks a long way into his work and does not get SOB. He has gained weight since last visit, says that he has been eating more recently. No lower extremity edema. He does not watch his sodium intake, drinks more than 2L a day.    Labs (4/17): K 3.9, creatinine 1.61 Labs (7/17): K 4.3, creatinine 1.78 Labs (01/09/2016) : K 4.6 Creatinine 1.76   ROS: All systems reviewed and negative except as per HPI.   PMH: 1. Chronic systolic CHF: Nonischemic CMP.  Likely due to poorly controlled HTN. HIV negative, SPEP negative, no M spike. UPEP negative. - Echo (2014): EF 20-25%.  - LHC (2014) with no significant CAD. - Echo (4/17) with EF 20-25%, RV mildly dilated, RV systolic function moderately reduced, severe LAE  - Echo (10/17) with EF 50-55%, normal RV size and systolic function.  2. HTN 3. CKD stage III 4. Obesity 5. Type II diabetes  Current Outpatient Prescriptions  Medication Sig Dispense Refill  . Blood Glucose Monitoring Suppl (ACURA BLOOD GLUCOSE METER) W/DEVICE KIT 1 meter with lancets and test strips for daily BS testing QS 1 month 1 kit 0  . carvedilol (COREG) 25 MG tablet Take 1 tablet (25 mg total) by mouth 2 (two) times daily with a meal. 60 tablet 6  .  furosemide (LASIX) 40 MG tablet Take 1 tablet (40 mg total) by mouth daily. 30 tablet 6  . glipiZIDE (GLUCOTROL) 5 MG tablet TAKE ONE TABLET BY MOUTH TWICE DAILY BEFORE MEAL(S) 120 tablet 3  . glucose blood (ONE TOUCH ULTRA TEST) test strip Use as directed to check blood sugar once a day. 100 each 3  . isosorbide-hydrALAZINE (BIDIL) 20-37.5 MG tablet Take 1 tablet by mouth 3 (three) times daily. 90 tablet 11  . LevOCARNitine (L-CARNITINE) 250 MG CAPS Take 250 mg by mouth daily.    . magnesium 30 MG tablet Take 30 mg by mouth 2 (two) times daily.    . metFORMIN (GLUCOPHAGE) 500 MG tablet Take 1 tablet (500 mg total) by mouth 2 (two) times daily with a meal. 60 tablet 0  . Multiple Vitamin (MULTIVITAMIN WITH MINERALS) TABS tablet Take 1 tablet by mouth daily.    . Omega-3 Fatty Acids (FISH OIL) 1000 MG CAPS Take 1 capsule by mouth daily.    . ONETOUCH DELICA LANCETS 33G MISC 1 Device by Other route daily. Use as directed for glucose testing once a day. 100 each 3  . potassium chloride SA (K-DUR,KLOR-CON) 20 MEQ tablet Take 2 tablets (40 mEq total) by mouth daily. 60 tablet 6  . sacubitril-valsartan (ENTRESTO) 49-51 MG Take 1 tablet by mouth 2 (two) times daily. 60 tablet 6  . spironolactone (ALDACTONE) 25 MG tablet Take   1 tablet (25 mg total) by mouth daily. (Patient not taking: Reported on 01/28/2017) 30 tablet 6   No current facility-administered medications for this encounter.     No Known Allergies    Social History   Social History  . Marital status: Single    Spouse name: N/A  . Number of children: N/A  . Years of education: N/A   Occupational History  . Not on file.   Social History Main Topics  . Smoking status: Never Smoker  . Smokeless tobacco: Not on file  . Alcohol use No  . Drug use: No  . Sexual activity: Not on file   Other Topics Concern  . 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 Heart disease Father 62       MI  . Hypertension Father   . Hyperlipidemia Father   . Diabetes Mother   . Hypertension Mother     Vitals:   01/28/17 1434  BP: (!) 166/100  Pulse: 84  SpO2: 96%  Weight: (!) 315 lb (142.9 kg)   Wt Readings from Last 3 Encounters:  01/28/17 (!) 315 lb (142.9 kg)  03/15/16 278 lb (126.1 kg)  02/14/16 276 lb 9.6 oz (125.5 kg)     PHYSICAL EXAM: General:  Well appearing male. Walked into clinic without difficulty.  HEENT: Normal.  Neck: Supple, JVP hard to assess but does not appear elevated. Carotids 2+ bilat; no bruits. No thyromegaly or nodule noted.  Cor: PMI nondisplaced. Regular rate and rhythm. No M/R/G.   Lungs: Clear bilaterally. Normal effort.  Abdomen: Obese, soft, NT, ND, no HSM. No bruits or masses. +BS  Extremities: no cyanosis, clubbing, rash. No edema Neuro: alert & oriented x 3, cranial nerves grossly intact. moves all 4 extremities w/o difficulty. Affect pleasant.  ASSESSMENT & PLAN:   1. Chronic systolic CHF:  Echo 09/27/2015 EF 20-25%, RV mild dilated with moderately reduced systolic function, severe LAE. Nonischemic cardiomyopathy, likely 2/2 poorly controlled HTN. HIV negative, SPEP negative, no M spike. UPEP negative.  Had LHC in 2014, negative for CAD. EF was 25-30% at that time. Echo 03/2016 with EF 50-55%, normal RV.  - NYHA I - Volume stable on exam.  - Continue Lasix 40 mg daily.  - Continue Entresto 49/51 mg BID.  - Increase Bidil to 2 tabs TID.  - Continue Coreg 25 mg BID - He has not been taking Spiro. Will have him return in 4 weeks to see PharmD. Can add back Spiro +/- increase Entresto next visit.  - Encouraged him to start walking 20 min a day, 5 days a week.    2. HTN:  - BP elevated. Increase Bidil as  above.   3. CKD: Stage III.  - BMET today.   4. DM2:  - Follows with PCP.   Follow up in one month with PharmD for HF medication titration. Follow up in 3 months with APP clinic.   Erin E Smith, NP-C  01/28/2017     

## 2017-01-28 NOTE — Patient Instructions (Signed)
Labs today (will call for abnormal results, otherwise no news is good news)  INCREASE Bidil to 2 Tabs Three Times Daily  Follow up with pharmacist in 1 month  Follow up in 3 months

## 2017-02-21 ENCOUNTER — Encounter: Payer: Self-pay | Admitting: Family Medicine

## 2017-02-27 ENCOUNTER — Ambulatory Visit (HOSPITAL_COMMUNITY)
Admission: RE | Admit: 2017-02-27 | Discharge: 2017-02-27 | Disposition: A | Discharge status: Home / Self Care | Payer: 59 | Source: Ambulatory Visit | Attending: Internal Medicine | Admitting: Internal Medicine

## 2017-02-27 DIAGNOSIS — N183 Chronic kidney disease, stage 3 (moderate): Secondary | ICD-10-CM | POA: Insufficient documentation

## 2017-02-27 DIAGNOSIS — I130 Hypertensive heart and chronic kidney disease with heart failure and stage 1 through stage 4 chronic kidney disease, or unspecified chronic kidney disease: Secondary | ICD-10-CM | POA: Insufficient documentation

## 2017-02-27 DIAGNOSIS — I5022 Chronic systolic (congestive) heart failure: Secondary | ICD-10-CM | POA: Insufficient documentation

## 2017-02-27 DIAGNOSIS — E1122 Type 2 diabetes mellitus with diabetic chronic kidney disease: Secondary | ICD-10-CM | POA: Insufficient documentation

## 2017-02-27 DIAGNOSIS — I428 Other cardiomyopathies: Secondary | ICD-10-CM | POA: Insufficient documentation

## 2017-02-27 DIAGNOSIS — Z79899 Other long term (current) drug therapy: Secondary | ICD-10-CM | POA: Diagnosis not present

## 2017-02-27 MED ORDER — SACUBITRIL-VALSARTAN 97-103 MG PO TABS: 1.0000 | ORAL_TABLET | Freq: Two times a day (BID) | ORAL | 11 refills | Status: DC

## 2017-02-27 NOTE — Progress Notes (Signed)
HF MD: [facility]  HPI:  Richard [name] is a 46 y.o. African American male with a past medical history that includes diabetes, chronic combined heart failure, hypertension, chronic kidney disease stage II.  Admitted from [facility] 09/26/15 with worsening SOB and LE edema. He reported medical non-compliance on his lasix regimen. Diuresed well, Down 14 lbs with discharge weight of 317 lbs.   Echo 09/27/2015 20-25%, grade 1 DD, RV mild dilated, moderately reduced, severe RAE.  Echo 03/2016 50-55%, grade 1 DD, RV ok.   He presents today for pharmacist-led HF medication titration. At last HF clinic visit on 01/28/17, his Bidil was increased to 2 tablets TID. Walks 1.5 miles to and from work and does not get SOB. He also walks on the treadmill 45-60 minutes at least 5 days a week and does some resistance training and push ups. He has cut out fried foods and soda from his diet. Still drinks more than 2L a day especially when he is working [employer].    . Shortness of breath/dyspnea on exertion? no  . Orthopnea/PND? no . Edema? no . Lightheadedness/dizziness? no . Daily weights at home? Yes - stable 308-309 lb  . Blood pressure/heart rate monitoring at home? no . Following low-sodium/fluid-restricted diet? Yes - attempting to stay <2L/day  HF Medications: Carvedilol 25 mg PO BID Furosemide 40 mg PO daily Bidil 2 tablets PO TID KCl 40 mEq PO daily Entresto 49-51 mg PO BID Spironolactone 25 mg PO daily   Has the patient been experiencing any side effects to the medications prescribed?  Yes - feels "weird" for about 30 mins after taking Bidil but this has started to subside  Does the patient have any problems obtaining medications due to transportation or finances?   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 Patient understands to avoid  decongestants.    Pertinent Lab Values: . 01/28/17: Serum creatinine 1.81 (BL 1.6-1.8), BUN 16, Potassium 3.5, Sodium 141  Vital Signs: . Weight: 315 lb (dry weight: 315 lb) . Blood pressure: 170/90 mmHg  . Heart rate: 71 bpm   Assessment: 1. Chronicsystolic CHF (EF 09>>62-83%), due to NICM (likely HTN). NYHA class I-IIsymptoms.  - Volume status stable  - Will increase Entresto to goal dose 97-103 mg BID  - Continue goal doses of carvedilol 25 mg BID, Bidil 2 tablets BID, and spironolactone 25 mg daily as well as furosemide 40 mg daily and KCl 40 mEq daily - Basic disease state pathophysiology, medication indication, mechanism and side effects reviewed at length with patient and he verbalized understanding 2. HTN - BP remains elevated well above goal of <130/80 mmHg - Reports good compliance with medication regimen and diet improving - Rarely drinks caffeinated beverages and is working out consistently - [employer] as above, if still elevated at next visit will consider addition of amlodipine as well as home BP monitoring in case there is some component of white coat hypertension 3. CKD Stage III - SCr stable ~1.6-1.8 4. DM2 - Follows with PCP  Plan: 1) Medication changes: Based on clinical presentation, vital signs and recent labs will increase Entresto to 97-103 mg BID 2) Labs: BMET in 7-10 days 3) Follow-up: Pharmacist on 03/13/17 and NP/PA on 04/30/17   Tallula Grindle K. [name], PharmD, BCPS, CPP Clinical Pharmacist Pager: [phone] Phone: [phone] 02/27/2017 2:35 PM

## 2017-02-27 NOTE — Patient Instructions (Addendum)
It was great to see you today!  Please INCREASE your Entresto to 97-103 mg TWICE DAILY.   You have an appointment to see me again on 03/13/17 at 2:00 PM.  You also have an appointment with the NP/PA on 04/30/17.

## 2017-02-28 ENCOUNTER — Other Ambulatory Visit: Payer: Self-pay | Admitting: Family Medicine

## 2017-03-01 ENCOUNTER — Other Ambulatory Visit: Payer: Self-pay | Admitting: *Deleted

## 2017-03-01 MED ORDER — METFORMIN HCL 500 MG PO TABS: 500.0000 mg | ORAL_TABLET | Freq: Two times a day (BID) | ORAL | 0 refills | Status: DC

## 2017-03-01 NOTE — Telephone Encounter (Signed)
Rx done and pt notified via Mychart message.

## 2017-03-04 ENCOUNTER — Telehealth (HOSPITAL_COMMUNITY): Payer: Self-pay | Admitting: Pharmacist

## 2017-03-04 NOTE — Telephone Encounter (Signed)
Mr. Lofton called stating that his new Entresto 97-103 mg BID Rx was $400/mo. I have called his insurance company [employer] (histocompatibility and immunogenetics)) who stated that this is because he has a deductible to meet. I have advised him to use the $10 copay card and call back if it is not covering the cost of his Entresto.   [name]. [name], PharmD, BCPS, CPP Clinical Pharmacist Pager: [phone] Phone: [phone] 03/04/2017 10:08 AM

## 2017-03-13 ENCOUNTER — Ambulatory Visit (HOSPITAL_COMMUNITY): Payer: 59

## 2017-04-12 ENCOUNTER — Other Ambulatory Visit: Payer: Self-pay | Admitting: Family Medicine

## 2017-04-12 ENCOUNTER — Ambulatory Visit (INDEPENDENT_AMBULATORY_CARE_PROVIDER_SITE_OTHER): Payer: 59 | Admitting: Family Medicine

## 2017-04-12 ENCOUNTER — Encounter: Payer: Self-pay | Admitting: Family Medicine

## 2017-04-12 VITALS — BP 128/88 | HR 65 | Temp 98.5°F | Ht 71.0 in | Wt 280.0 lb

## 2017-04-12 DIAGNOSIS — E1122 Type 2 diabetes mellitus with diabetic chronic kidney disease: Secondary | ICD-10-CM | POA: Diagnosis not present

## 2017-04-12 DIAGNOSIS — I10 Essential (primary) hypertension: Secondary | ICD-10-CM | POA: Diagnosis not present

## 2017-04-12 DIAGNOSIS — E1165 Type 2 diabetes mellitus with hyperglycemia: Secondary | ICD-10-CM

## 2017-04-12 DIAGNOSIS — N183 Chronic kidney disease, stage 3 (moderate): Secondary | ICD-10-CM

## 2017-04-12 DIAGNOSIS — IMO0002 Reserved for concepts with insufficient information to code with codable children: Secondary | ICD-10-CM

## 2017-04-12 DIAGNOSIS — I5042 Chronic combined systolic (congestive) and diastolic (congestive) heart failure: Secondary | ICD-10-CM

## 2017-04-12 MED ORDER — GLIPIZIDE 5 MG PO TABS: ORAL_TABLET | ORAL | 3 refills | Status: DC

## 2017-04-12 MED ORDER — METFORMIN HCL 500 MG PO TABS: 500.0000 mg | ORAL_TABLET | Freq: Two times a day (BID) | ORAL | 3 refills | Status: DC

## 2017-04-12 NOTE — Progress Notes (Signed)
HPI:  Richard [name] is a pleasant 46 y.o. here for follow up. Chronic medical problems summarized below were reviewed for changes.  History of very poor compliance.  We actually have not seen him in over a year looks like.  Reports he has been doing well.  No concerns today.  Only came in because he needs refills.  Reports he continues to exercise on a regular basis and is trying to eat healthy. Denies CP, SOB, DOE, hypoglycemia, vision changes, treatment intolerance or new symptoms. Reports his eye exam is set up. Plans to get his flu shot elsewhere, does not want to get it here today.  DM, with CKD: -meds: asa stopped by cardiology, acei, metformin, glipizide -eye exam: due -denies: polyuria, polydipsia, vision changes, wounds on feet -last hgba1c 8.7 09/2015; 4.8 8/17 -Weight 281 pounds 8/17 --> 280 today   HTN, CsdCHF, CM, pulm HTN: -managed by his cardiologist -hospitalized 09/2015 -meds: enestro, lasix, spiro, coreg, bidil tid -denies: CP, palpitations, swelling, weight gain  ROS: See pertinent positives and negatives per HPI.  Past Medical History:  Diagnosis Date  . Acute decompensated heart failure ([place]) 01/03/2013  . Allergy   . CHF (congestive heart failure) (HCC)    systolic  . Chronic kidney disease   . CKD (chronic kidney disease) stage 2, GFR 60-89 ml/min 01/04/2013  . DM (diabetes mellitus) ([place]) 01/06/2013  . Hypertension    hx htn emergency  . Obesity     History reviewed. No pertinent surgical history.  Family History  Problem Relation Age of Onset  . Heart disease Father 11       MI  . Hypertension Father   . Hyperlipidemia Father   . Diabetes Mother   . Hypertension Mother     Social History   Socioeconomic History  . Marital status: Single    Spouse name: None  . Number of children: None  . Years of education: None  . Highest education level: None  Social Needs  . Financial resource strain: None  . Food insecurity - worry: None  . Food  insecurity - inability: None  . Transportation needs - medical: None  . Transportation needs - non-medical: None  Occupational History  . None  Tobacco Use  . Smoking status: Never Smoker  . Smokeless tobacco: Never Used  Substance and Sexual Activity  . Alcohol use: No  . Drug use: No  . Sexual activity: None  Other Topics Concern  . None  Social History Narrative   Work or School: Engineer, agricultural, homeless Chief of Staff      Home Situation: lives alone      Spiritual Beliefs: Christian      Lifestyle: 15 minutes dialy walking - working up; working on diet              Current Outpatient Medications:  .  Blood Glucose Monitoring Suppl (ACURA BLOOD GLUCOSE METER) W/DEVICE KIT, 1 meter with lancets and test strips for daily BS testing QS 1 month, Disp: 1 kit, Rfl: 0 .  carvedilol (COREG) 25 MG tablet, Take 1 tablet (25 mg total) by mouth 2 (two) times daily with a meal., Disp: 60 tablet, Rfl: 6 .  furosemide (LASIX) 40 MG tablet, Take 1 tablet (40 mg total) by mouth daily., Disp: 30 tablet, Rfl: 6 .  glipiZIDE (GLUCOTROL) 5 MG tablet, TAKE ONE TABLET BY MOUTH TWICE DAILY BEFORE MEAL(S), Disp: 180 tablet, Rfl: 3 .  glucose blood (ONE TOUCH ULTRA TEST) test strip,  Use as directed to check blood sugar once a day., Disp: 100 each, Rfl: 3 .  isosorbide-hydrALAZINE (BIDIL) 20-37.5 MG tablet, Take 2 tablets by mouth 3 (three) times daily., Disp: 180 tablet, Rfl: 11 .  LevOCARNitine (L-CARNITINE) 250 MG CAPS, Take 250 mg by mouth daily., Disp: , Rfl:  .  magnesium 30 MG tablet, Take 30 mg by mouth 2 (two) times daily., Disp: , Rfl:  .  metFORMIN (GLUCOPHAGE) 500 MG tablet, Take 1 tablet (500 mg total) 2 (two) times daily with a meal by mouth., Disp: 180 tablet, Rfl: 3 .  Multiple Vitamin (MULTIVITAMIN WITH MINERALS) TABS tablet, Take 1 tablet by mouth daily., Disp: , Rfl:  .  Omega-3 Fatty Acids (FISH OIL) 1000 MG CAPS, Take 1 capsule by mouth daily., Disp: , Rfl:  .   ONETOUCH DELICA LANCETS 44Y MISC, 1 Device by Other route daily. Use as directed for glucose testing once a day., Disp: 100 each, Rfl: 3 .  potassium chloride SA (K-DUR,KLOR-CON) 20 MEQ tablet, Take 2 tablets (40 mEq total) by mouth daily., Disp: 60 tablet, Rfl: 6 .  sacubitril-valsartan (ENTRESTO) 97-103 MG, Take 1 tablet by mouth 2 (two) times daily., Disp: 60 tablet, Rfl: 11 .  spironolactone (ALDACTONE) 25 MG tablet, Take 1 tablet (25 mg total) by mouth daily., Disp: 30 tablet, Rfl: 6  EXAM:  Vitals:   04/12/17 1624  BP: 128/88  Pulse: 65  Temp: 98.5 F (36.9 C)    Body mass index is 39.05 kg/m.  GENERAL: vitals reviewed and listed above, alert, oriented, appears well hydrated and in no acute distress  HEENT: atraumatic, conjunttiva clear, no obvious abnormalities on inspection of external nose and ears  NECK: no obvious masses on inspection  LUNGS: clear to auscultation bilaterally, no wheezes, rales or rhonchi, good air movement  CV: HRRR, no peripheral edema  MS: moves all extremities without noticeable abnormality  PSYCH: pleasant and cooperative, no obvious depression or anxiety  ASSESSMENT AND PLAN:  Discussed the following assessment and plan:  Uncontrolled type 2 diabetes mellitus with stage 3 chronic kidney disease, without long-term current use of insulin ([place]), Chronic - Plan: Hemoglobin A1c, CANCELED: Hemoglobin A1c, CANCELED: Hemoglobin A1c  Morbid obesity ([place]) - Plan: Cholesterol, Total, HDL cholesterol, CANCELED: Cholesterol, total, CANCELED: HDL cholesterol, CANCELED: HDL cholesterol, CANCELED: Cholesterol, Total  Essential hypertension, benign - Plan: Basic metabolic panel, CBC (no diff), CANCELED: Basic metabolic panel, CANCELED: CBC, CANCELED: CBC (no diff), CANCELED: Basic metabolic panel  Chronic combined systolic and diastolic CHF, NYHA class 2 (HCC), Chronic  -Advised lifelong commitment to regular exercise and healthy diet -Labs  today -Refills on diabetes medicines provided -Advised eye exam and flu shot to, he plans to do flu shot elsewhere, reports eye exam is scheduled -Follow-up in 3 months   Patient Instructions  BEFORE YOU LEAVE: -follow up: 3 - 4 months; please come fasting so that we can check a fasting cholesterol panel -labs  We have ordered labs or studies at this visit. It can take up to 1-2 weeks for results and processing. IF results require follow up or explanation, we will call you with instructions. Clinically stable results will be released to your [facility]. If you have not heard from [place] or cannot find your results in [facility] in 2 weeks please contact our office at [phone].  If you are not yet signed up for [facility], please consider signing up.         We recommend the following healthy lifestyle for  LIFE: 1) Small portions.   Tip: eat off of a salad plate instead of a dinner plate.  Tip: It is ok to feel hungry after a meal - that likely means you ate an appropriate portion.  Tip: if you need more or a snack choose fruits, veggies and/or a handful of nuts or seeds.  2) Eat a healthy clean diet.   TRY TO EAT: -at least 5-7 servings of low sugar vegetables per day (not corn, potatoes or bananas.) -berries are the best choice if you wish to eat fruit.   -lean meets (fish, chicken or [place] breasts) -vegan proteins for some meals - beans or tofu, whole grains, nuts and seeds -Replace bad fats with good fats - good fats include: fish, nuts and seeds, canola oil, olive oil -small amounts of low fat or non fat dairy -small amounts of100 % whole grains - check the lables  AVOID: -SUGAR, sweets, anything with added sugar, corn syrup or sweeteners -if you must have a sweetener, small amounts of stevia may be best -sweetened beverages -simple starches (rice, bread, potatoes, pasta, chips, etc - small amounts of 100% whole grains are ok) -red meat, pork, butter -fried foods, fast food,  processed food, excessive dairy, eggs and coconut.  3)Get at least 150 minutes of sweaty aerobic exercise per week.  4)Reduce stress - consider counseling, meditation and relaxation to balance other aspects of your life.     Colin Benton R., DO

## 2017-04-12 NOTE — Patient Instructions (Signed)
BEFORE YOU LEAVE: -follow up: 3 - 4 months; please come fasting so that we can check a fasting cholesterol panel -labs  We have ordered labs or studies at this visit. It can take up to 1-2 weeks for results and processing. IF results require follow up or explanation, we will call you with instructions. Clinically stable results will be released to your [facility]. If you have not heard from [place] or cannot find your results in [facility] in 2 weeks please contact our office at [phone].  If you are not yet signed up for [facility], please consider signing up.         We recommend the following healthy lifestyle for LIFE: 1) Small portions.   Tip: eat off of a salad plate instead of a dinner plate.  Tip: It is ok to feel hungry after a meal - that likely means you ate an appropriate portion.  Tip: if you need more or a snack choose fruits, veggies and/or a handful of nuts or seeds.  2) Eat a healthy clean diet.   TRY TO EAT: -at least 5-7 servings of low sugar vegetables per day (not corn, potatoes or bananas.) -berries are the best choice if you wish to eat fruit.   -lean meets (fish, chicken or [place] breasts) -vegan proteins for some meals - beans or tofu, whole grains, nuts and seeds -Replace bad fats with good fats - good fats include: fish, nuts and seeds, canola oil, olive oil -small amounts of low fat or non fat dairy -small amounts of100 % whole grains - check the lables  AVOID: -SUGAR, sweets, anything with added sugar, corn syrup or sweeteners -if you must have a sweetener, small amounts of stevia may be best -sweetened beverages -simple starches (rice, bread, potatoes, pasta, chips, etc - small amounts of 100% whole grains are ok) -red meat, pork, butter -fried foods, fast food, processed food, excessive dairy, eggs and coconut.  3)Get at least 150 minutes of sweaty aerobic exercise per week.  4)Reduce stress - consider counseling, meditation and relaxation to balance  other aspects of your life.

## 2017-04-12 NOTE — Telephone Encounter (Signed)
Denied.  Pt needs appt with Dr. Maudie Mercury.

## 2017-04-13 LAB — CBC
HEMATOCRIT: 42.9 % (ref 38.5–50.0)
HEMOGLOBIN: 14.8 g/dL (ref 13.2–17.1)
MCH: 28.6 pg (ref 27.0–33.0)
MCHC: 34.5 g/dL (ref 32.0–36.0)
MCV: 83 fL (ref 80.0–100.0)
MPV: 11.4 fL (ref 7.5–12.5)
Platelets: 178 10*3/uL (ref 140–400)
RBC: 5.17 10*6/uL (ref 4.20–5.80)
RDW: 11.8 % (ref 11.0–15.0)
WBC: 5.4 10*3/uL (ref 3.8–10.8)

## 2017-04-13 LAB — BASIC METABOLIC PANEL
BUN / CREAT RATIO: 7 (calc) (ref 6–22)
BUN: 12 mg/dL (ref 7–25)
CO2: 21 mmol/L (ref 20–32)
CREATININE: 1.61 mg/dL — AB (ref 0.60–1.35)
Calcium: 9.8 mg/dL (ref 8.6–10.3)
Chloride: 102 mmol/L (ref 98–110)
Glucose, Bld: 79 mg/dL (ref 65–99)
Potassium: 3.8 mmol/L (ref 3.5–5.3)
Sodium: 140 mmol/L (ref 135–146)

## 2017-04-13 LAB — HEMOGLOBIN A1C
EAG (MMOL/L): 5 (calc)
Hgb A1c MFr Bld: 4.8 % of total Hgb (ref <5.7)
Mean Plasma Glucose: 91 (calc)

## 2017-04-13 LAB — HDL CHOLESTEROL: HDL: 44 mg/dL (ref >40)

## 2017-04-13 LAB — CHOLESTEROL, TOTAL: CHOLESTEROL: 168 mg/dL (ref <200)

## 2017-04-21 ENCOUNTER — Other Ambulatory Visit (HOSPITAL_COMMUNITY): Payer: Self-pay | Admitting: Cardiology

## 2017-04-21 DIAGNOSIS — I509 Heart failure, unspecified: Secondary | ICD-10-CM

## 2017-04-30 ENCOUNTER — Ambulatory Visit (HOSPITAL_COMMUNITY)
Admission: RE | Admit: 2017-04-30 | Discharge: 2017-04-30 | Disposition: A | Discharge status: Home / Self Care | Payer: 59 | Source: Ambulatory Visit | Attending: Internal Medicine | Admitting: Internal Medicine

## 2017-04-30 ENCOUNTER — Encounter (HOSPITAL_COMMUNITY): Payer: Self-pay

## 2017-04-30 VITALS — BP 150/104 | HR 78 | Wt 267.2 lb

## 2017-04-30 DIAGNOSIS — Z7984 Long term (current) use of oral hypoglycemic drugs: Secondary | ICD-10-CM | POA: Insufficient documentation

## 2017-04-30 DIAGNOSIS — I5042 Chronic combined systolic (congestive) and diastolic (congestive) heart failure: Secondary | ICD-10-CM | POA: Diagnosis not present

## 2017-04-30 DIAGNOSIS — IMO0002 Reserved for concepts with insufficient information to code with codable children: Secondary | ICD-10-CM

## 2017-04-30 DIAGNOSIS — Z79899 Other long term (current) drug therapy: Secondary | ICD-10-CM | POA: Insufficient documentation

## 2017-04-30 DIAGNOSIS — I130 Hypertensive heart and chronic kidney disease with heart failure and stage 1 through stage 4 chronic kidney disease, or unspecified chronic kidney disease: Secondary | ICD-10-CM | POA: Insufficient documentation

## 2017-04-30 DIAGNOSIS — I10 Essential (primary) hypertension: Secondary | ICD-10-CM | POA: Diagnosis not present

## 2017-04-30 DIAGNOSIS — I5022 Chronic systolic (congestive) heart failure: Secondary | ICD-10-CM | POA: Diagnosis present

## 2017-04-30 DIAGNOSIS — E6609 Other obesity due to excess calories: Secondary | ICD-10-CM

## 2017-04-30 DIAGNOSIS — N183 Chronic kidney disease, stage 3 unspecified: Secondary | ICD-10-CM

## 2017-04-30 DIAGNOSIS — E1165 Type 2 diabetes mellitus with hyperglycemia: Secondary | ICD-10-CM

## 2017-04-30 DIAGNOSIS — E1122 Type 2 diabetes mellitus with diabetic chronic kidney disease: Secondary | ICD-10-CM | POA: Diagnosis not present

## 2017-04-30 LAB — BASIC METABOLIC PANEL
ANION GAP: 9 (ref 5–15)
BUN: 12 mg/dL (ref 6–20)
CO2: 22 mmol/L (ref 22–32)
Calcium: 9.3 mg/dL (ref 8.9–10.3)
Chloride: 109 mmol/L (ref 101–111)
Creatinine, Ser: 1.47 mg/dL — ABNORMAL HIGH (ref 0.61–1.24)
GFR calc Af Amer: >60 mL/min (ref >60)
GFR, EST NON AFRICAN AMERICAN: 56 mL/min — AB (ref >60)
GLUCOSE: 66 mg/dL (ref 65–99)
Potassium: 3.7 mmol/L (ref 3.5–5.1)
SODIUM: 140 mmol/L (ref 135–145)

## 2017-04-30 NOTE — Progress Notes (Signed)
Patient ID: Richard [name], male   DOB: [birth year], 46 y.o.   MRN: [id number]    Advanced Heart Failure Clinic Note    Primary Care: Colin Benton, DO Cardiology:  Dr. Aundra Dubin   HPI: Richard [name] is a 46 y.o. male with a past medical history that includes diabetes, chronic combined heart failure, hypertension, chronic kidney disease stage II.  Admitted from [facility] 09/26/15 with worsening SOB and LE edema. He reported medical non-compliance on his lasix regimen. Diuresed well, Down 14 lbs with discharge weight of 317 lbs.   Echo 09/27/2015 20-25%, grade 1 DD, RV mild dilated, moderately reduced, severe RAE.  Echo 03/2016 50-55%, grade 1 DD, RV ok.   He presents today for regular follow up. BP markedly elevated on arrival, but denies any symptoms. Sent back to his car to take his morning medications. No HAs, slurred speech, vision changes, weakness, or confusion. BP was 128/88 at PCP visit earlier this month. Of note, he has not taken his medications this am. He states he misses his afternoon hydralazine 1-2 times a week.  Staying active. Did cardio last night and felt great. Denies any DOE. No lightheadedness or dizziness. Has lost 30+ lbs since September.  Watching salt and sodium intake.   Labs (4/17): K 3.9, creatinine 1.61 Labs (7/17): K 4.3, creatinine 1.78 Labs (01/09/2016) : K 4.6 Creatinine 1.76   Review of systems complete and found to be negative unless listed in HPI.    PMH: 1. Chronic systolic CHF: Nonischemic CMP.  Likely due to poorly controlled HTN. HIV negative, SPEP negative, no M spike. UPEP negative. - Echo (2014): EF 20-25%.  - LHC (2014) with no significant CAD. - Echo (4/17) with EF 20-25%, RV mildly dilated, RV systolic function moderately reduced, severe LAE  - Echo (10/17) with EF 50-55%, normal RV size and systolic function.  2. HTN 3. CKD stage III 4. Obesity 5. Type II diabetes  Current Outpatient Medications  Medication Sig Dispense Refill  . Blood Glucose  Monitoring Suppl (ACURA BLOOD GLUCOSE METER) W/DEVICE KIT 1 meter with lancets and test strips for daily BS testing QS 1 month 1 kit 0  . carvedilol (COREG) 25 MG tablet Take 1 tablet (25 mg total) by mouth 2 (two) times daily with a meal. 60 tablet 6  . furosemide (LASIX) 40 MG tablet Take 1 tablet (40 mg total) by mouth daily. 30 tablet 6  . furosemide (LASIX) 40 MG tablet TAKE ONE TABLET BY MOUTH TWICE DAILY 60 tablet 1  . glipiZIDE (GLUCOTROL) 5 MG tablet TAKE ONE TABLET BY MOUTH TWICE DAILY BEFORE MEAL(S) 180 tablet 3  . glucose blood (ONE TOUCH ULTRA TEST) test strip Use as directed to check blood sugar once a day. 100 each 3  . isosorbide-hydrALAZINE (BIDIL) 20-37.5 MG tablet Take 2 tablets by mouth 3 (three) times daily. 180 tablet 11  . LevOCARNitine (L-CARNITINE) 250 MG CAPS Take 250 mg by mouth daily.    . magnesium 30 MG tablet Take 30 mg by mouth 2 (two) times daily.    . metFORMIN (GLUCOPHAGE) 500 MG tablet Take 1 tablet (500 mg total) 2 (two) times daily with a meal by mouth. 180 tablet 3  . Multiple Vitamin (MULTIVITAMIN WITH MINERALS) TABS tablet Take 1 tablet by mouth daily.    . Omega-3 Fatty Acids (FISH OIL) 1000 MG CAPS Take 1 capsule by mouth daily.    Glory Rosebush DELICA LANCETS 93J MISC 1 Device by Other route daily. Use as directed for  glucose testing once a day. 100 each 3  . potassium chloride SA (K-DUR,KLOR-CON) 20 MEQ tablet Take 2 tablets (40 mEq total) by mouth daily. 60 tablet 6  . sacubitril-valsartan (ENTRESTO) 97-103 MG Take 1 tablet by mouth 2 (two) times daily. 60 tablet 11  . spironolactone (ALDACTONE) 25 MG tablet Take 1 tablet (25 mg total) by mouth daily. 30 tablet 6   No current facility-administered medications for this encounter.     No Known Allergies    Social History   Socioeconomic History  . Marital status: Single    Spouse name: Not on file  . Number of children: Not on file  . Years of education: Not on file  . Highest education level:  Not on file  Social Needs  . Financial resource strain: Not on file  . Food insecurity - worry: Not on file  . Food insecurity - inability: Not on file  . Transportation needs - medical: Not on file  . Transportation needs - non-medical: Not on file  Occupational History  . Not on file  Tobacco Use  . Smoking status: Never Smoker  . Smokeless tobacco: Never Used  Substance and Sexual Activity  . Alcohol use: No  . Drug use: No  . Sexual activity: Not on file  Other Topics Concern  . Not on file  Social History Narrative   Work or School: Engineer, agricultural, homeless Chief of Staff      Home Situation: lives alone      Spiritual Beliefs: Christian      Lifestyle: 15 minutes dialy walking - working up; working on diet               Family History  Problem Relation Age of Onset  . Heart disease Father 44       MI  . Hypertension Father   . Hyperlipidemia Father   . Diabetes Mother   . Hypertension Mother     Vitals:   04/30/17 0909  BP: (!) 192/128  Pulse: 78  SpO2: 99%  Weight: 267 lb 3.2 oz (121.2 kg)   Wt Readings from Last 3 Encounters:  04/30/17 267 lb 3.2 oz (121.2 kg)  04/12/17 280 lb (127 kg)  02/27/17 (!) 315 lb (142.9 kg)     PHYSICAL EXAM: General: Well appearing. NAD.  HEENT: Normal Neck: Supple. JVP 5-6. Carotids 2+ bilat; no bruits. No thyromegaly or nodule noted. Cor: PMI nondisplaced. RRR, No M/G/R noted Lungs: CTAB, normal effort. Abdomen: Soft, non-tender, non-distended, no HSM. No bruits or masses. +BS  Extremities: No cyanosis, clubbing, or rash. R and LLE no edema.  Neuro: Alert & orientedx3, cranial nerves grossly intact. moves all 4 extremities w/o difficulty. Affect pleasant   ASSESSMENT & PLAN:   1. Chronic systolic CHF:  Echo 8/85/0277 EF 20-25%, RV mild dilated with moderately reduced systolic function, severe LAE. Nonischemic cardiomyopathy, likely 2/2 poorly controlled HTN. HIV negative, SPEP negative, no M  spike. UPEP negative.  Had [place] in 2014, negative for CAD. EF was 25-30% at that time. Echo 03/2016 with EF 50-55%, normal RV.  - NYHA I - Volume stable on exam - Continue Lasix 40 mg daily.  - Continue Entresto 97/103 mg BID. BMET today.  - Continue Bidil 2 tabs TID.  - Continue Coreg 25 mg BID - Continue spiro 25 mg daily.  - Reinforced fluid restriction to < 2 L daily, sodium restriction to less than 2000 mg daily, and the importance of daily weights.  2. Poorly controlled HTN:  - Todays reading appears to be an outlier, as usually runs OK. Denies any significant stressors.  - BP improved significantly after taking his morning medications.  - Stressed importance of taking his meds in a timely and regular manner, and compliance with order dosage.  - He refuses amlodipine today, but is going to keep a BP log for the next week and report back.   3. CKD: Stage III.  - BMET today.   4. DM2:  - Follows with PCP.   Labs today. He is on max dose meds. Follow up 2 months with MD with Echo with poorly controlled HTN.   Shirley Friar, PA-C  04/30/2017   Greater than 50% of the 40 minute visit was spent in counseling/coordination of care regarding disease state education, salt/fluid restriction, sliding scale diuretics, and medication compliance.

## 2017-04-30 NOTE — Patient Instructions (Signed)
Routine lab work today. Will notify you of abnormal results, otherwise no news is good news!  Check blood pressure twice daily/morning and evening (1 hour after medications) for 1 week. Call results to CHF clinic [phone] - Push option to speak to triage or leave a message with readings.  Follow up 2 months with Dr. Aundra Dubin and echocardiogram.  Take all medication as prescribed the day of your appointment. Bring all medications with you to your appointment.  Do the following things EVERYDAY: 1) Weigh yourself in the morning before breakfast. Write it down and keep it in a log. 2) Take your medicines as prescribed 3) Eat low salt foods-Limit salt (sodium) to 2000 mg per day.  4) Stay as active as you can everyday 5) Limit all fluids for the day to less than 2 liters

## 2017-06-13 ENCOUNTER — Encounter: Payer: Self-pay | Admitting: Family Medicine

## 2017-07-03 ENCOUNTER — Ambulatory Visit (HOSPITAL_COMMUNITY): Payer: 59

## 2017-07-03 ENCOUNTER — Encounter (HOSPITAL_COMMUNITY): Payer: 59 | Admitting: Cardiology

## 2017-07-17 IMAGING — DX DG CHEST 1V PORT
1 series · 1 of 1 positions shown · non-contrast
Comparison: 01/23/2013

CLINICAL DATA: Shortness of breath, onset this morning.

EXAM:
PORTABLE CHEST 1 VIEW

[chest ap]
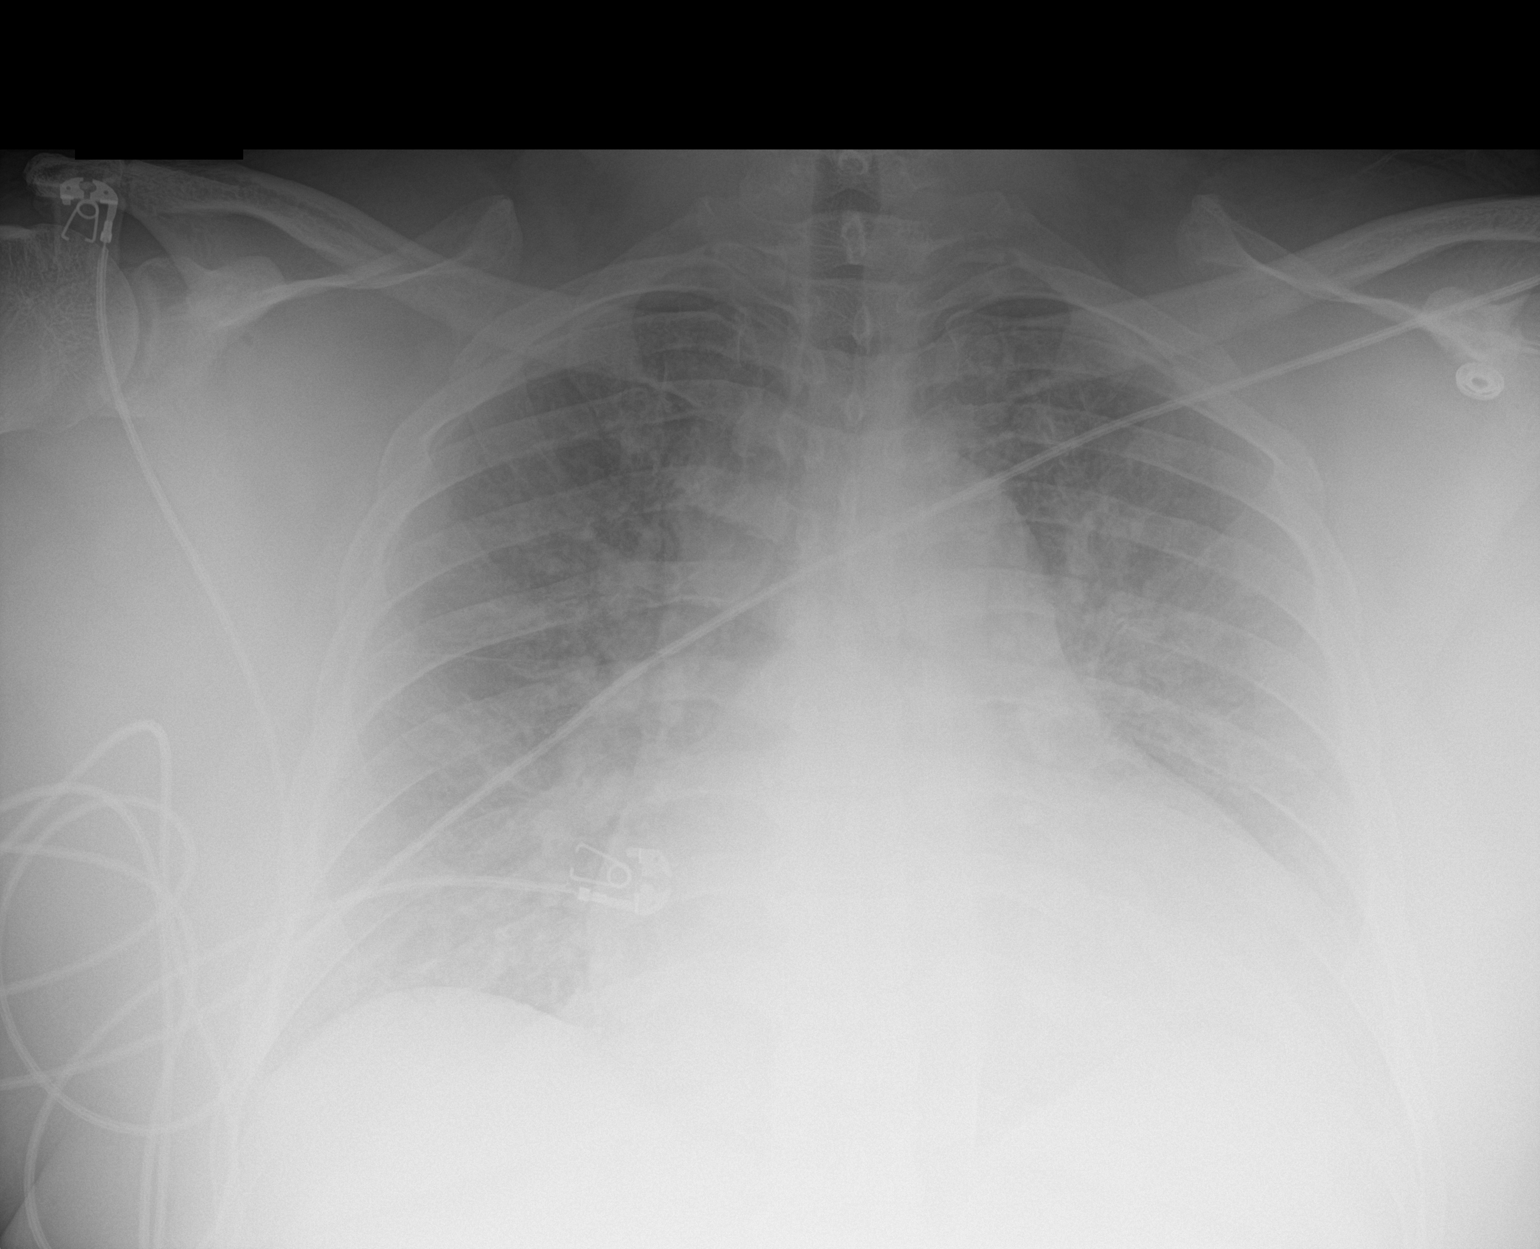

[1 of 1 positions shown; findings below may reference images not displayed]

FINDINGS: Cardiomegaly with vascular congestion and interstitial prominence,
likely interstitial edema. No confluent opacities or effusions.
IMPRESSION: Diffuse interstitial prominence, likely mild interstitial edema.

## 2017-08-08 ENCOUNTER — Ambulatory Visit: Payer: 59 | Admitting: Family Medicine

## 2017-11-25 ENCOUNTER — Encounter (HOSPITAL_COMMUNITY): Payer: Self-pay

## 2017-11-25 NOTE — Progress Notes (Signed)
Initial Entresto PA submitted to covermymeds for pending approval.  Renee Pain, RN

## 2017-12-04 NOTE — Progress Notes (Signed)
No update available for Entresto PA at this time, determination still unknown through Covermymeds.

## 2017-12-17 NOTE — Progress Notes (Signed)
PA determination for Entresto listed as Unknown at this time.  Renee Pain, RN

## 2017-12-18 NOTE — Progress Notes (Signed)
Entresto approved through [employer] effective 12/09/17-12/10/18 Copy of approval scanned into patient's electronic medical record.  Renee Pain, RN

## 2018-12-01 ENCOUNTER — Telehealth (HOSPITAL_COMMUNITY): Payer: Self-pay | Admitting: Pharmacy Technician

## 2018-12-01 NOTE — Telephone Encounter (Signed)
Attempted Prior Authorization for [employer] through [employer], no prior authorization needed at this time.   Jodelle Gross (CPhT) [facility]ocate [phone] 12/01/2018 4:05 PM

## 2019-05-19 ENCOUNTER — Other Ambulatory Visit: Payer: Self-pay

## 2019-05-19 ENCOUNTER — Inpatient Hospital Stay (HOSPITAL_COMMUNITY)
Admission: EM | Admit: 2019-05-19 | Discharge: 2019-05-22 | DRG: 065 | Disposition: A | Discharge status: Home / Self Care | Payer: 59 | Attending: Internal Medicine | Admitting: Internal Medicine

## 2019-05-19 ENCOUNTER — Other Ambulatory Visit (HOSPITAL_COMMUNITY): Payer: Self-pay

## 2019-05-19 ENCOUNTER — Emergency Department (HOSPITAL_COMMUNITY): Payer: 59

## 2019-05-19 DIAGNOSIS — I639 Cerebral infarction, unspecified: Secondary | ICD-10-CM | POA: Diagnosis not present

## 2019-05-19 DIAGNOSIS — I6381 Other cerebral infarction due to occlusion or stenosis of small artery: Principal | ICD-10-CM | POA: Diagnosis present

## 2019-05-19 DIAGNOSIS — Z20828 Contact with and (suspected) exposure to other viral communicable diseases: Secondary | ICD-10-CM | POA: Diagnosis present

## 2019-05-19 DIAGNOSIS — N182 Chronic kidney disease, stage 2 (mild): Secondary | ICD-10-CM | POA: Diagnosis present

## 2019-05-19 DIAGNOSIS — Z79899 Other long term (current) drug therapy: Secondary | ICD-10-CM

## 2019-05-19 DIAGNOSIS — G8321 Monoplegia of upper limb affecting right dominant side: Secondary | ICD-10-CM | POA: Diagnosis present

## 2019-05-19 DIAGNOSIS — E785 Hyperlipidemia, unspecified: Secondary | ICD-10-CM | POA: Diagnosis present

## 2019-05-19 DIAGNOSIS — J302 Other seasonal allergic rhinitis: Secondary | ICD-10-CM | POA: Diagnosis present

## 2019-05-19 DIAGNOSIS — I160 Hypertensive urgency: Secondary | ICD-10-CM | POA: Diagnosis present

## 2019-05-19 DIAGNOSIS — E876 Hypokalemia: Secondary | ICD-10-CM | POA: Diagnosis present

## 2019-05-19 DIAGNOSIS — Z9119 Patient's noncompliance with other medical treatment and regimen: Secondary | ICD-10-CM

## 2019-05-19 DIAGNOSIS — I10 Essential (primary) hypertension: Secondary | ICD-10-CM | POA: Diagnosis present

## 2019-05-19 DIAGNOSIS — Z6837 Body mass index (BMI) 37.0-37.9, adult: Secondary | ICD-10-CM

## 2019-05-19 DIAGNOSIS — E1122 Type 2 diabetes mellitus with diabetic chronic kidney disease: Secondary | ICD-10-CM | POA: Diagnosis present

## 2019-05-19 DIAGNOSIS — Z8249 Family history of ischemic heart disease and other diseases of the circulatory system: Secondary | ICD-10-CM

## 2019-05-19 DIAGNOSIS — N1831 Chronic kidney disease, stage 3a: Secondary | ICD-10-CM | POA: Diagnosis present

## 2019-05-19 DIAGNOSIS — Z833 Family history of diabetes mellitus: Secondary | ICD-10-CM

## 2019-05-19 DIAGNOSIS — Z7984 Long term (current) use of oral hypoglycemic drugs: Secondary | ICD-10-CM

## 2019-05-19 DIAGNOSIS — I130 Hypertensive heart and chronic kidney disease with heart failure and stage 1 through stage 4 chronic kidney disease, or unspecified chronic kidney disease: Secondary | ICD-10-CM | POA: Diagnosis present

## 2019-05-19 DIAGNOSIS — E669 Obesity, unspecified: Secondary | ICD-10-CM | POA: Diagnosis present

## 2019-05-19 DIAGNOSIS — R7303 Prediabetes: Secondary | ICD-10-CM | POA: Diagnosis present

## 2019-05-19 DIAGNOSIS — Z713 Dietary counseling and surveillance: Secondary | ICD-10-CM

## 2019-05-19 DIAGNOSIS — E1151 Type 2 diabetes mellitus with diabetic peripheral angiopathy without gangrene: Secondary | ICD-10-CM | POA: Diagnosis present

## 2019-05-19 DIAGNOSIS — R29700 NIHSS score 0: Secondary | ICD-10-CM | POA: Diagnosis present

## 2019-05-19 DIAGNOSIS — I5042 Chronic combined systolic (congestive) and diastolic (congestive) heart failure: Secondary | ICD-10-CM | POA: Diagnosis present

## 2019-05-19 LAB — DIFFERENTIAL
Abs Immature Granulocytes: 0.02 10*3/uL (ref 0.00–0.07)
Basophils Absolute: 0 10*3/uL (ref 0.0–0.1)
Basophils Relative: 1 %
Eosinophils Absolute: 0.2 10*3/uL (ref 0.0–0.5)
Eosinophils Relative: 4 %
Immature Granulocytes: 0 %
Lymphocytes Relative: 25 %
Lymphs Abs: 1.6 10*3/uL (ref 0.7–4.0)
Monocytes Absolute: 0.7 10*3/uL (ref 0.1–1.0)
Monocytes Relative: 11 %
Neutro Abs: 3.7 10*3/uL (ref 1.7–7.7)
Neutrophils Relative %: 59 %

## 2019-05-19 LAB — COMPREHENSIVE METABOLIC PANEL
ALT: 21 U/L (ref 0–44)
AST: 25 U/L (ref 15–41)
Albumin: 3.8 g/dL (ref 3.5–5.0)
Alkaline Phosphatase: 44 U/L (ref 38–126)
Anion gap: 11 (ref 5–15)
BUN: 12 mg/dL (ref 6–20)
CO2: 26 mmol/L (ref 22–32)
Calcium: 9.2 mg/dL (ref 8.9–10.3)
Chloride: 104 mmol/L (ref 98–111)
Creatinine, Ser: 1.54 mg/dL — ABNORMAL HIGH (ref 0.61–1.24)
GFR calc Af Amer: >60 mL/min (ref >60)
GFR calc non Af Amer: 52 mL/min — ABNORMAL LOW (ref >60)
Glucose, Bld: 111 mg/dL — ABNORMAL HIGH (ref 70–99)
Potassium: 3.4 mmol/L — ABNORMAL LOW (ref 3.5–5.1)
Sodium: 141 mmol/L (ref 135–145)
Total Bilirubin: 0.8 mg/dL (ref 0.3–1.2)
Total Protein: 7.1 g/dL (ref 6.5–8.1)

## 2019-05-19 LAB — PROTIME-INR
INR: 1 (ref 0.8–1.2)
Prothrombin Time: 13 seconds (ref 11.4–15.2)

## 2019-05-19 LAB — CBC
HCT: 49.5 % (ref 39.0–52.0)
Hemoglobin: 16.2 g/dL (ref 13.0–17.0)
MCH: 28.6 pg (ref 26.0–34.0)
MCHC: 32.7 g/dL (ref 30.0–36.0)
MCV: 87.3 fL (ref 80.0–100.0)
Platelets: 219 10*3/uL (ref 150–400)
RBC: 5.67 MIL/uL (ref 4.22–5.81)
RDW: 13.2 % (ref 11.5–15.5)
WBC: 6.2 10*3/uL (ref 4.0–10.5)
nRBC: 0 % (ref 0.0–0.2)

## 2019-05-19 LAB — APTT: aPTT: 29 seconds (ref 24–36)

## 2019-05-19 NOTE — ED Triage Notes (Addendum)
Pt states that he went to sleep at 6pm this evening, and believes that he was sleeping on his arm. States that when he awoke, his left arm was numb, but now just feels weak. Pt also states that he used to take BP meds, but stopped taking them two months ago. Changed his diet and was feeling better so he thought the diet was helping.

## 2019-05-20 ENCOUNTER — Observation Stay (HOSPITAL_COMMUNITY): Payer: 59

## 2019-05-20 ENCOUNTER — Emergency Department (HOSPITAL_COMMUNITY): Payer: 59

## 2019-05-20 ENCOUNTER — Inpatient Hospital Stay (HOSPITAL_COMMUNITY): Payer: 59

## 2019-05-20 ENCOUNTER — Encounter (HOSPITAL_COMMUNITY): Payer: Self-pay | Admitting: Internal Medicine

## 2019-05-20 DIAGNOSIS — J302 Other seasonal allergic rhinitis: Secondary | ICD-10-CM | POA: Diagnosis present

## 2019-05-20 DIAGNOSIS — R7303 Prediabetes: Secondary | ICD-10-CM | POA: Diagnosis present

## 2019-05-20 DIAGNOSIS — I5042 Chronic combined systolic (congestive) and diastolic (congestive) heart failure: Secondary | ICD-10-CM | POA: Diagnosis present

## 2019-05-20 DIAGNOSIS — Z713 Dietary counseling and surveillance: Secondary | ICD-10-CM | POA: Diagnosis not present

## 2019-05-20 DIAGNOSIS — E782 Mixed hyperlipidemia: Secondary | ICD-10-CM

## 2019-05-20 DIAGNOSIS — N1831 Chronic kidney disease, stage 3a: Secondary | ICD-10-CM

## 2019-05-20 DIAGNOSIS — I10 Essential (primary) hypertension: Secondary | ICD-10-CM | POA: Diagnosis not present

## 2019-05-20 DIAGNOSIS — I5041 Acute combined systolic (congestive) and diastolic (congestive) heart failure: Secondary | ICD-10-CM

## 2019-05-20 DIAGNOSIS — E876 Hypokalemia: Secondary | ICD-10-CM | POA: Diagnosis present

## 2019-05-20 DIAGNOSIS — Z79899 Other long term (current) drug therapy: Secondary | ICD-10-CM | POA: Diagnosis not present

## 2019-05-20 DIAGNOSIS — I160 Hypertensive urgency: Secondary | ICD-10-CM | POA: Diagnosis present

## 2019-05-20 DIAGNOSIS — Z9119 Patient's noncompliance with other medical treatment and regimen: Secondary | ICD-10-CM | POA: Diagnosis not present

## 2019-05-20 DIAGNOSIS — I340 Nonrheumatic mitral (valve) insufficiency: Secondary | ICD-10-CM | POA: Diagnosis not present

## 2019-05-20 DIAGNOSIS — Z833 Family history of diabetes mellitus: Secondary | ICD-10-CM | POA: Diagnosis not present

## 2019-05-20 DIAGNOSIS — I639 Cerebral infarction, unspecified: Secondary | ICD-10-CM | POA: Diagnosis present

## 2019-05-20 DIAGNOSIS — E669 Obesity, unspecified: Secondary | ICD-10-CM | POA: Diagnosis present

## 2019-05-20 DIAGNOSIS — Z6837 Body mass index (BMI) 37.0-37.9, adult: Secondary | ICD-10-CM | POA: Diagnosis not present

## 2019-05-20 DIAGNOSIS — I6389 Other cerebral infarction: Secondary | ICD-10-CM | POA: Diagnosis not present

## 2019-05-20 DIAGNOSIS — Z7984 Long term (current) use of oral hypoglycemic drugs: Secondary | ICD-10-CM | POA: Diagnosis not present

## 2019-05-20 DIAGNOSIS — I130 Hypertensive heart and chronic kidney disease with heart failure and stage 1 through stage 4 chronic kidney disease, or unspecified chronic kidney disease: Secondary | ICD-10-CM | POA: Diagnosis present

## 2019-05-20 DIAGNOSIS — I6381 Other cerebral infarction due to occlusion or stenosis of small artery: Secondary | ICD-10-CM | POA: Diagnosis present

## 2019-05-20 DIAGNOSIS — E1151 Type 2 diabetes mellitus with diabetic peripheral angiopathy without gangrene: Secondary | ICD-10-CM | POA: Diagnosis present

## 2019-05-20 DIAGNOSIS — I63412 Cerebral infarction due to embolism of left middle cerebral artery: Secondary | ICD-10-CM

## 2019-05-20 DIAGNOSIS — I361 Nonrheumatic tricuspid (valve) insufficiency: Secondary | ICD-10-CM | POA: Diagnosis not present

## 2019-05-20 DIAGNOSIS — E785 Hyperlipidemia, unspecified: Secondary | ICD-10-CM | POA: Diagnosis present

## 2019-05-20 DIAGNOSIS — N182 Chronic kidney disease, stage 2 (mild): Secondary | ICD-10-CM | POA: Diagnosis not present

## 2019-05-20 DIAGNOSIS — R29700 NIHSS score 0: Secondary | ICD-10-CM | POA: Diagnosis present

## 2019-05-20 DIAGNOSIS — Z8249 Family history of ischemic heart disease and other diseases of the circulatory system: Secondary | ICD-10-CM | POA: Diagnosis not present

## 2019-05-20 DIAGNOSIS — E1122 Type 2 diabetes mellitus with diabetic chronic kidney disease: Secondary | ICD-10-CM | POA: Diagnosis present

## 2019-05-20 DIAGNOSIS — G8321 Monoplegia of upper limb affecting right dominant side: Secondary | ICD-10-CM | POA: Diagnosis present

## 2019-05-20 DIAGNOSIS — Z20828 Contact with and (suspected) exposure to other viral communicable diseases: Secondary | ICD-10-CM | POA: Diagnosis present

## 2019-05-20 LAB — LIPID PANEL
Cholesterol: 268 mg/dL — ABNORMAL HIGH (ref 0–200)
HDL: 51 mg/dL (ref >40)
LDL Cholesterol: 196 mg/dL — ABNORMAL HIGH (ref 0–99)
Total CHOL/HDL Ratio: 5.3 RATIO
Triglycerides: 104 mg/dL (ref <150)
VLDL: 21 mg/dL (ref 0–40)

## 2019-05-20 LAB — HEMOGLOBIN A1C
Hgb A1c MFr Bld: 5.8 % — ABNORMAL HIGH (ref 4.8–5.6)
Mean Plasma Glucose: 119.76 mg/dL

## 2019-05-20 LAB — ETHANOL: Alcohol, Ethyl (B): <10 mg/dL (ref <10)

## 2019-05-20 LAB — HIV ANTIBODY (ROUTINE TESTING W REFLEX): HIV Screen 4th Generation wRfx: NONREACTIVE

## 2019-05-20 LAB — TSH: TSH: 0.884 u[IU]/mL (ref 0.350–4.500)

## 2019-05-20 LAB — SARS CORONAVIRUS 2 (TAT 6-24 HRS): SARS Coronavirus 2: NEGATIVE

## 2019-05-20 LAB — ECHOCARDIOGRAM COMPLETE

## 2019-05-20 MED ORDER — ASPIRIN 300 MG RE SUPP: 300.0000 mg | Freq: Every day | RECTAL | Status: DC

## 2019-05-20 MED ORDER — ALBUTEROL SULFATE (2.5 MG/3ML) 0.083% IN NEBU: 2.5000 mg | INHALATION_SOLUTION | Freq: Four times a day (QID) | RESPIRATORY_TRACT | Status: DC | PRN

## 2019-05-20 MED ORDER — CLOPIDOGREL BISULFATE 75 MG PO TABS
75.0000 mg | ORAL_TABLET | Freq: Every day | ORAL | Status: DC
  Administered 2019-05-20 – 2019-05-21 (×2): 75 mg via ORAL
  Filled 2019-05-20 (×2): qty 1

## 2019-05-20 MED ORDER — STROKE: EARLY STAGES OF RECOVERY BOOK
Freq: Once | Status: AC
  Filled 2019-05-20: qty 1

## 2019-05-20 MED ORDER — ACETAMINOPHEN 325 MG PO TABS: 650.0000 mg | ORAL_TABLET | ORAL | Status: DC | PRN

## 2019-05-20 MED ORDER — ASPIRIN 325 MG PO TABS
325.0000 mg | ORAL_TABLET | Freq: Every day | ORAL | Status: DC
  Administered 2019-05-20: 325 mg via ORAL
  Filled 2019-05-20: qty 1

## 2019-05-20 MED ORDER — ATORVASTATIN CALCIUM 80 MG PO TABS
80.0000 mg | ORAL_TABLET | Freq: Every day | ORAL | Status: DC
  Administered 2019-05-20 – 2019-05-21 (×2): 80 mg via ORAL
  Filled 2019-05-20 (×2): qty 1

## 2019-05-20 MED ORDER — SODIUM CHLORIDE 0.9 % IV SOLN: Freq: Once | INTRAVENOUS | Status: DC

## 2019-05-20 MED ORDER — POTASSIUM CHLORIDE CRYS ER 20 MEQ PO TBCR
20.0000 meq | EXTENDED_RELEASE_TABLET | ORAL | Status: AC
  Administered 2019-05-20: 20 meq via ORAL
  Filled 2019-05-20: qty 1

## 2019-05-20 MED ORDER — ASPIRIN EC 81 MG PO TBEC
81.0000 mg | DELAYED_RELEASE_TABLET | Freq: Every day | ORAL | Status: DC
  Administered 2019-05-21: 81 mg via ORAL
  Filled 2019-05-20: qty 1

## 2019-05-20 MED ORDER — LABETALOL HCL 5 MG/ML IV SOLN: 10.0000 mg | INTRAVENOUS | Status: DC | PRN

## 2019-05-20 MED ORDER — ACETAMINOPHEN 160 MG/5ML PO SOLN: 650.0000 mg | ORAL | Status: DC | PRN

## 2019-05-20 MED ORDER — IOHEXOL 350 MG/ML SOLN
75.0000 mL | Freq: Once | INTRAVENOUS | Status: AC | PRN
  Administered 2019-05-20: 75 mL via INTRAVENOUS

## 2019-05-20 MED ORDER — LABETALOL HCL 5 MG/ML IV SOLN
10.0000 mg | INTRAVENOUS | Status: DC | PRN
  Administered 2019-05-20 – 2019-05-22 (×4): 10 mg via INTRAVENOUS
  Filled 2019-05-20 (×5): qty 4

## 2019-05-20 MED ORDER — ENOXAPARIN SODIUM 40 MG/0.4ML ~~LOC~~ SOLN
40.0000 mg | SUBCUTANEOUS | Status: DC
  Administered 2019-05-20 – 2019-05-21 (×2): 40 mg via SUBCUTANEOUS
  Filled 2019-05-20 (×2): qty 0.4

## 2019-05-20 MED ORDER — SENNOSIDES-DOCUSATE SODIUM 8.6-50 MG PO TABS: 1.0000 | ORAL_TABLET | Freq: Every evening | ORAL | Status: DC | PRN

## 2019-05-20 MED ORDER — ACETAMINOPHEN 650 MG RE SUPP: 650.0000 mg | RECTAL | Status: DC | PRN

## 2019-05-20 NOTE — Progress Notes (Signed)
Patient arrived to 3w. Alert and oriented x4. States no pain. Skin intact. Tele placed and verified. Patient sitting in chair and ordering breakfast. Patient educated on call light. Educated to call when he needs to get up; patient verbalized he understood. No needs or concerns at this time. [facility]

## 2019-05-20 NOTE — ED Provider Notes (Signed)
tPA in stroke considered but not given due to: Symptoms resolved/mild      Ripley Fraise, MD 05/20/19 [phone]

## 2019-05-20 NOTE — Progress Notes (Signed)
Lower extremity venous has been completed.   Preliminary results in CV Proc.   Abram Sander 05/20/2019 11:18 AM

## 2019-05-20 NOTE — ED Notes (Signed)
Patient transported to X-ray 

## 2019-05-20 NOTE — ED Notes (Signed)
Patient transported to CT 

## 2019-05-20 NOTE — ED Notes (Addendum)
Neurologist at bedside evaluating pt. , explained MRI results / plan of care and admission to patient. Neurologist aware of patient's hypertension .

## 2019-05-20 NOTE — Progress Notes (Signed)
    [facility] has been requested to perform a transesophageal echocardiogram on 05/22/19 for Stroke.  After careful review of history and examination, the risks and benefits of transesophageal echocardiogram have been explained including risks of esophageal damage, perforation (1:10,000 risk), bleeding, pharyngeal hematoma as well as other potential complications associated with conscious sedation including aspiration, arrhythmia, respiratory failure and death. Alternatives to treatment were discussed, questions were answered. Patient is willing to proceed. Labs and vital signs stable.   Reino Bellis, NP-C 05/20/2019 2:50 PM

## 2019-05-20 NOTE — Consult Note (Addendum)
Neurology Consultation  Reason for Consult: Stroke Referring Physician: Dr. Penni Bombard  CC: Right arm weakness  History is obtained from: Patient, chart review  HPI: Richard [name] is a 48 y.o. male past medical history of systolic heart failure, CKD, diabetes, hypertension with a history of hypertensive emergency documented in the chart, obesity, who has not been taking his medications in an attempt to get rid of medications by more holistic and natural means, presented to the emergency room for sudden onset of right arm numbness and weakness on the evening of 05/19/2019. According the patient, he was last normal around 5 PM on 05/19/2019 when he took a nap.  Upon waking up at around 6 PM, he noted that his arm felt as if it had gone to sleep.  The symptoms did not resolve immediately or soon enough for him to be concerned and come to the ER.  As he was coming to the hospital, the symptoms started to resolve.  His initial exam in the ED was NIH stroke scale of 0.  He was extremely hypertensive with systolics in the 675Q.  An MRI of the brain was obtained because of his risk factors, extremely elevated blood pressures and focal neurological deficits-that revealed a small infarct in the left frontal cortical portion.  Neurological consultation was obtained for this. Patient denies any history of illicit drug use. Reports family history of heart attacks in young-father died of heart attack at the age of 57. Denies any similar episodes in the past. Denies any visual changes or headaches.  Denies any tingling numbness or weakness anywhere else in the body. Denies any palpitations.  Denies shortness of breath. 2D echo in April 2017-LVEF 20 to 25%.  Repeat echo in April 2017 with improved LVEF of 50 to 55%.  LKW: 5 PM on 05/19/2019 tpa given?: no, NIH 0, symptoms resolved Premorbid modified Rankin scale (mRS): 0  ROS: ROS was performed and is negative except as noted in the HPI.   Past Medical  History:  Diagnosis Date  . Acute decompensated heart failure ([place]) 01/03/2013  . Allergy   . CHF (congestive heart failure) ([facility])    systolic  . Chronic kidney disease   . CKD (chronic kidney disease) stage 2, GFR 60-89 ml/min 01/04/2013  . DM (diabetes mellitus) ([place]) 01/06/2013  . Hypertension    hx htn emergency  . Obesity     Family History  Problem Relation Age of Onset  . Heart disease Father 80       MI  . Hypertension Father   . Hyperlipidemia Father   . Diabetes Mother   . Hypertension Mother    Social History:   reports that he has never smoked. He has never used smokeless tobacco. He reports that he does not drink alcohol or use drugs.  Medications No current facility-administered medications for this encounter.  Current Outpatient Medications:  .  Blood Glucose Monitoring Suppl (ACURA BLOOD GLUCOSE METER) W/DEVICE KIT, 1 meter with lancets and test strips for daily BS testing QS 1 month, Disp: 1 kit, Rfl: 0 .  carvedilol (COREG) 25 MG tablet, Take 1 tablet (25 mg total) by mouth 2 (two) times daily with a meal., Disp: 60 tablet, Rfl: 6 .  furosemide (LASIX) 40 MG tablet, Take 1 tablet (40 mg total) by mouth daily., Disp: 30 tablet, Rfl: 6 .  furosemide (LASIX) 40 MG tablet, TAKE ONE TABLET BY MOUTH TWICE DAILY, Disp: 60 tablet, Rfl: 1 .  glipiZIDE (GLUCOTROL) 5 MG  tablet, TAKE ONE TABLET BY MOUTH TWICE DAILY BEFORE MEAL(S), Disp: 180 tablet, Rfl: 3 .  glucose blood (ONE TOUCH ULTRA TEST) test strip, Use as directed to check blood sugar once a day., Disp: 100 each, Rfl: 3 .  isosorbide-hydrALAZINE (BIDIL) 20-37.5 MG tablet, Take 2 tablets by mouth 3 (three) times daily., Disp: 180 tablet, Rfl: 11 .  LevOCARNitine (L-CARNITINE) 250 MG CAPS, Take 250 mg by mouth daily., Disp: , Rfl:  .  magnesium 30 MG tablet, Take 30 mg by mouth 2 (two) times daily., Disp: , Rfl:  .  metFORMIN (GLUCOPHAGE) 500 MG tablet, Take 1 tablet (500 mg total) 2 (two) times daily with a meal by  mouth., Disp: 180 tablet, Rfl: 3 .  Multiple Vitamin (MULTIVITAMIN WITH MINERALS) TABS tablet, Take 1 tablet by mouth daily., Disp: , Rfl:  .  Omega-3 Fatty Acids (FISH OIL) 1000 MG CAPS, Take 1 capsule by mouth daily., Disp: , Rfl:  .  ONETOUCH DELICA LANCETS 94R MISC, 1 Device by Other route daily. Use as directed for glucose testing once a day., Disp: 100 each, Rfl: 3 .  potassium chloride SA (K-DUR,KLOR-CON) 20 MEQ tablet, Take 2 tablets (40 mEq total) by mouth daily., Disp: 60 tablet, Rfl: 6 .  sacubitril-valsartan (ENTRESTO) 97-103 MG, Take 1 tablet by mouth 2 (two) times daily., Disp: 60 tablet, Rfl: 11 .  spironolactone (ALDACTONE) 25 MG tablet, Take 1 tablet (25 mg total) by mouth daily., Disp: 30 tablet, Rfl: 6  Exam: Current vital signs: BP (!) 185/122   Pulse 75   Temp 98.1 F (36.7 C) (Oral)   Resp (!) 25   SpO2 99%  Vital signs in last 24 hours: Temp:  [98.1 F (36.7 C)] 98.1 F (36.7 C) (12/16 0050) Pulse Rate:  [70-85] 75 (12/16 0600) Resp:  [16-25] 25 (12/16 0600) BP: (169-208)/(111-131) 185/122 (12/16 0600) SpO2:  [99 %-100 %] 99 % (12/16 0600)  GENERAL: Awake, alert in NAD HEENT: - Normocephalic and atraumatic, dry mm, no LN++, no Thyromegally LUNGS - Clear to auscultation bilaterally with no wheezes CV - S1S2 RRR, no m/r/g, equal pulses bilaterally. ABDOMEN - Soft, nontender, nondistended with normoactive BS Ext: warm, well perfused, intact peripheral pulses,no edema  NEURO:  Mental Status: AA&Ox3  Language: speech is clear.  Naming, repetition, fluency, and comprehension intact. Cranial Nerves: PERRL EOMI, visual fields full, no facial asymmetry, facial sensation intact, hearing intact, tongue/uvula/soft palate midline, normal sternocleidomastoid and trapezius muscle strength. No evidence of tongue atrophy or fibrillations Motor: 5/5 in all 4s. No drift. Normal tone. Tone: is normal and bulk is normal Sensation- Intact to light touch  bilaterally Coordination: FTN intact bilaterally, no ataxia in BLE. Gait- deferred  NIHSS-0   Labs I have reviewed labs in epic and the results pertinent to this consultation are:  CBC    Component Value Date/Time   WBC 6.2 05/19/2019 2104   RBC 5.67 05/19/2019 2104   HGB 16.2 05/19/2019 2104   HCT 49.5 05/19/2019 2104   PLT 219 05/19/2019 2104   MCV 87.3 05/19/2019 2104   MCV 89.2 01/03/2013 1800   MCH 28.6 05/19/2019 2104   MCHC 32.7 05/19/2019 2104   RDW 13.2 05/19/2019 2104   LYMPHSABS 1.6 05/19/2019 2104   MONOABS 0.7 05/19/2019 2104   EOSABS 0.2 05/19/2019 2104   BASOSABS 0.0 05/19/2019 2104    CMP     Component Value Date/Time   NA 141 05/19/2019 2104   K 3.4 (L) 05/19/2019 2104  CL 104 05/19/2019 2104   CO2 26 05/19/2019 2104   GLUCOSE 111 (H) 05/19/2019 2104   BUN 12 05/19/2019 2104   CREATININE 1.54 (H) 05/19/2019 2104   CREATININE 1.61 (H) 04/12/2017 1704   CALCIUM 9.2 05/19/2019 2104   PROT 7.1 05/19/2019 2104   ALBUMIN 3.8 05/19/2019 2104   AST 25 05/19/2019 2104   ALT 21 05/19/2019 2104   ALKPHOS 44 05/19/2019 2104   BILITOT 0.8 05/19/2019 2104   GFRNONAA 52 (L) 05/19/2019 2104   GFRAA >60 05/19/2019 2104    Imaging I have reviewed the images obtained:  CT-scan of the brain-questionable pontomedullary junction abnormality ?  Cavernous angioma.  Comparison with MRI-likely artifactual.  MRI examination of the brain-12 mm acute ischemic infarct in the posterior left frontal cortical infarct in the precentral gyrus.  Moderate chronic microvascular ischemic disease.  Small remote left thalamic lacunar infarct.  Brainstem also shows advanced microvascular ischemic disease.  Assessment: 48 year old with significant cerebrovascular risk factors presenting for right arm numbness and weakness that has since resolved, noted to have a small acute ischemic infarct in the posterior left frontal cortex in the precentral gyrus. Likely source cardioembolic  versus small vessel disease given history of CHF and multiple other risk factors. Needs further work-up-to rule out cardiac source such as an LV thrombus, given prior history of CHF, to a point where the LVEF was 20 to 25% in April 2017.  Subsequent echo in October 2017 with improved LVEF  Impression: Acute ischemic stroke-cardioembolic versus small vessel  Recommendations: Admit to hospitalist Frequent neurochecks Telemetry CTA head and neck 2D echo A1c Lipid panel Urinary toxicology screen Aspirin 325 Atorvastatin 80 Permissive hypertension-allow for permissive hypertension for 24 to 48 hours, treating only if systolic blood pressures over 220 on a as needed basis.  Gradual goal at discharge should be normotensive. PT OT Speech therapy N.p.o. until cleared by bedside stroke swallow screen or formal swallow evaluation Although he is young, I would defer the need for checking a hypercoagulable panel to the stroke team because I believe that there is enough evidence that his current risk factors might be responsible for the stroke, and might not mandate checking for hypercoagulable panel.  Stroke team will follow Please call with questions  -- Amie Portland, MD [facility] Pager: [phone] If 7pm to 7am, please call on call as listed on AMION.

## 2019-05-20 NOTE — ED Notes (Signed)
Report given to Kelsey RN

## 2019-05-20 NOTE — H&P (Addendum)
History and Physical    Richard [name] [id number] DOB: Jun 10, 1970 DOA: 05/19/2019  Referring MD/NP/PA: Mitzi Hansen, MD PCP: Patient, No Pcp Per  Patient coming from: Home  Chief Complaint: Right arm numbness weakness  I have personally briefly reviewed patient's old medical records in [place]   HPI: Richard [name] is a 48 y.o. right-handed male with medical history significant of hypertension, systolic congestive heart failure, prediabetes, chronic kidney disease stage II, who presents with complaints of right arm numbness and weakness.  Patient reports waking up to go to work last night at around 6 PM and reporting that his hand felt as though it were asleep.  He had tried to pick up the help of coffee but was unable to.  Last week patient reported that he had been getting headaches, but related to him recently getting fitted for Invisalign.  He has not been on any medications in quite some time never followed up on previous.  Patient reported that he lost significant amount of weight, but regained 30pounds since the pandemic started.  Denies having any significant fever, chills, shortness of breath, cough, nausea, vomiting, or diarrhea.  Family history is significant for his dad passed away from a heart attack at the age of 66.  ED Course: Upon admission into the emergency department patient was seen to be afebrile, respirations 16-25, and blood pressures elevated up to 208/125.  Neurology evaluated the patient and initial CT scan of the brain revealed a 9 mm hyperattenuating structure in the left anterior lateral pontomedullary junction for which MRI was recommended.  MRI revealed 12 mm acute ischemic posterior left frontal cortical infarct.  Patient was not considered a candidate for TPA.   Review of Systems  Constitutional: Negative for fever and malaise/fatigue.       Positive for weight gain 30 pounds  HENT: Negative for ear discharge and sinus pain.   Eyes: Negative for  photophobia and pain.  Respiratory: Negative for cough and shortness of breath.   Cardiovascular: Negative for chest pain and leg swelling.  Gastrointestinal: Negative for abdominal pain, nausea and vomiting.  Genitourinary: Negative for dysuria and frequency.  Musculoskeletal: Negative for joint pain and myalgias.  Skin: Negative for rash.  Neurological: Positive for focal weakness and headaches. Negative for seizures and loss of consciousness.  Endo/Heme/Allergies: Negative for polydipsia.  Psychiatric/Behavioral: Negative for substance abuse. The patient does not have insomnia.     Past Medical History:  Diagnosis Date  . Acute decompensated heart failure ([facility]) 01/03/2013  . Allergy   . CHF (congestive heart failure) ([facility])    systolic  . Chronic kidney disease   . CKD (chronic kidney disease) stage 2, GFR 60-89 ml/min 01/04/2013  . DM (diabetes mellitus) ([facility]) 01/06/2013  . Hypertension    hx htn emergency  . Obesity     Past Surgical History:  Procedure Laterality Date  . LEFT AND RIGHT HEART CATHETERIZATION WITH CORONARY ANGIOGRAM N/A 01/07/2013   Procedure: LEFT AND RIGHT HEART CATHETERIZATION WITH CORONARY ANGIOGRAM;  Surgeon: Birdie Riddle, MD;  Location: [facility];  Service: Cardiovascular;  Laterality: N/A;     reports that he has never smoked. He has never used smokeless tobacco. He reports that he does not drink alcohol or use drugs.  No Known Allergies  Family History  Problem Relation Age of Onset  . Heart disease Father 58       MI  . Hypertension Father   . Hyperlipidemia Father   . Diabetes  Mother   . Hypertension Mother     Prior to Admission medications   Medication Sig Start Date End Date Taking? Authorizing Provider  Blood Glucose Monitoring Suppl (ACURA BLOOD GLUCOSE METER) W/DEVICE KIT 1 meter with lancets and test strips for daily BS testing QS 1 month 01/10/13   Geradine Girt, DO  carvedilol (COREG) 25 MG tablet Take 1 tablet (25 mg total) by  mouth 2 (two) times daily with a meal. 01/28/17   Arbutus Leas, NP  furosemide (LASIX) 40 MG tablet Take 1 tablet (40 mg total) by mouth daily. 01/28/17   Arbutus Leas, NP  furosemide (LASIX) 40 MG tablet TAKE ONE TABLET BY MOUTH TWICE DAILY 04/23/17   Larey Dresser, MD  glipiZIDE (GLUCOTROL) 5 MG tablet TAKE ONE TABLET BY MOUTH TWICE DAILY BEFORE MEAL(S) 04/12/17   Colin Benton R, DO  glucose blood (ONE TOUCH ULTRA TEST) test strip Use as directed to check blood sugar once a day. 10/01/15   Almyra Deforest, PA  isosorbide-hydrALAZINE (BIDIL) 20-37.5 MG tablet Take 2 tablets by mouth 3 (three) times daily. 01/28/17   Arbutus Leas, NP  LevOCARNitine (L-CARNITINE) 250 MG CAPS Take 250 mg by mouth daily.    [provider]  magnesium 30 MG tablet Take 30 mg by mouth 2 (two) times daily.    [provider]  metFORMIN (GLUCOPHAGE) 500 MG tablet Take 1 tablet (500 mg total) 2 (two) times daily with a meal by mouth. 04/12/17   Lucretia Kern, DO  Multiple Vitamin (MULTIVITAMIN WITH MINERALS) TABS tablet Take 1 tablet by mouth daily.    [provider]  Omega-3 Fatty Acids (FISH OIL) 1000 MG CAPS Take 1 capsule by mouth daily.    [provider]  [facility] DELICA LANCETS 99I MISC 1 Device by Other route daily. Use as directed for glucose testing once a day. 10/01/15   Almyra Deforest, PA  potassium chloride SA (K-DUR,KLOR-CON) 20 MEQ tablet Take 2 tablets (40 mEq total) by mouth daily. 01/28/17   Arbutus Leas, NP  sacubitril-valsartan (ENTRESTO) 97-103 MG Take 1 tablet by mouth 2 (two) times daily. 02/27/17   Larey Dresser, MD  spironolactone (ALDACTONE) 25 MG tablet Take 1 tablet (25 mg total) by mouth daily. 01/28/17   Arbutus Leas, NP    Physical Exam:  Constitutional: Obese male in NAD, calm, comfortable Vitals:   05/20/19 0050 05/20/19 0412 05/20/19 0600 05/20/19 0630  BP: (!) 169/111 (!) 174/118 (!) 185/122 (!) 168/114  Pulse: 72 70 75 78  Resp: 16 16 (!) 25 (!) 23  Temp:  98.1 F (36.7 C)     TempSrc: Oral     SpO2: 99% 100% 99% 97%   Eyes: PERRL, lids and conjunctivae normal ENMT: Mucous membranes are moist. Posterior pharynx clear of any exudate or lesions. Normal dentition.  Neck: normal, supple, no masses, no thyromegaly Respiratory: clear to auscultation bilaterally, no wheezing, no crackles. Normal respiratory effort. No accessory muscle use.  Cardiovascular: Regular rate and rhythm, no murmurs / rubs / gallops. No extremity edema. 2+ pedal pulses. No carotid bruits.  Abdomen: no tenderness, no masses palpated. No hepatosplenomegaly. Bowel sounds positive.  Musculoskeletal: no clubbing / cyanosis. No joint deformity upper and lower extremities. Good ROM, no contractures. Normal muscle tone.  Skin: no rashes, lesions, ulcers. No induration Neurologic: CN 2-12 grossly intact. Sensation intact, DTR normal. Strength 5/5 in all 4.  Psychiatric: Normal judgment and insight. Alert and oriented x  3. Normal mood.     Labs on Admission: I have personally reviewed following labs and imaging studies  CBC: Recent Labs  Lab 05/19/19 2104  WBC 6.2  NEUTROABS 3.7  HGB 16.2  HCT 49.5  MCV 87.3  PLT 832   Basic Metabolic Panel: Recent Labs  Lab 05/19/19 2104  NA 141  K 3.4*  CL 104  CO2 26  GLUCOSE 111*  BUN 12  CREATININE 1.54*  CALCIUM 9.2   GFR: CrCl cannot be calculated (Unknown ideal weight.). Liver Function Tests: Recent Labs  Lab 05/19/19 2104  AST 25  ALT 21  ALKPHOS 44  BILITOT 0.8  PROT 7.1  ALBUMIN 3.8   No results for input(s): LIPASE, AMYLASE in the last 168 hours. No results for input(s): AMMONIA in the last 168 hours. Coagulation Profile: Recent Labs  Lab 05/19/19 2104  INR 1.0   Cardiac Enzymes: No results for input(s): CKTOTAL, CKMB, CKMBINDEX, TROPONINI in the last 168 hours. BNP (last 3 results) No results for input(s): PROBNP in the last 8760 hours. HbA1C: No results for input(s): HGBA1C in the last 72  hours. CBG: No results for input(s): GLUCAP in the last 168 hours. Lipid Profile: No results for input(s): CHOL, HDL, LDLCALC, TRIG, CHOLHDL, LDLDIRECT in the last 72 hours. Thyroid Function Tests: No results for input(s): TSH, T4TOTAL, FREET4, T3FREE, THYROIDAB in the last 72 hours. Anemia Panel: No results for input(s): VITAMINB12, FOLATE, FERRITIN, TIBC, IRON, RETICCTPCT in the last 72 hours. Urine analysis:    Component Value Date/Time   BILIRUBINUR small 01/03/2013 1809   PROTEINUR 300 01/03/2013 1809   UROBILINOGEN 1.0 01/03/2013 1809   NITRITE neg 01/03/2013 1809   LEUKOCYTESUR Negative 01/03/2013 1809   Sepsis Labs: No results found for this or any previous visit (from the past 240 hour(s)).   Radiological Exams on Admission: CT HEAD WO CONTRAST  Result Date: 05/19/2019 CLINICAL DATA:  Woke from sleep with left arm weakness, possibly slept on arm. EXAM: CT HEAD WITHOUT CONTRAST TECHNIQUE: Contiguous axial images were obtained from the base of the skull through the vertex without intravenous contrast. COMPARISON:  None. FINDINGS: Brain: Question a subtle 9 mm hyperattenuating structure seen in the left anterolateral pontomedullary junction (2/9). No adjacent edema. No associated mass effect. No convincing CT evidence of acute infarction, hemorrhage, hydrocephalus, or extra-axial collection. Vascular: Atherosclerotic calcification of the carotid siphons and intradural vertebral arteries. No hyperdense vessel. Skull: No calvarial fracture or suspicious osseous lesion. No scalp swelling or hematoma. Sinuses/Orbits: Paranasal sinuses and mastoid air cells are predominantly clear. Included orbital structures are unremarkable. Other: None IMPRESSION: 1. Question a subtle 9 mm hyperattenuating structure in the left anterolateral pontomedullary junction. No adjacent edema. No associated mass effect. This may represent a small cavernous malformation, however, given the patient's symptoms,  recommend MRI with and without contrast for further characterization. 2. No other acute intracranial abnormality. 3. Atherosclerosis of the carotid siphons and intradural vertebral arteries. Electronically Signed   By: Lovena Le M.D.   On: 05/19/2019 21:43   MR BRAIN WO CONTRAST  Result Date: 05/20/2019 CLINICAL DATA:  Initial evaluation for acute weakness, left upper extremity numbness. EXAM: MRI HEAD WITHOUT CONTRAST TECHNIQUE: Multiplanar, multiecho pulse sequences of the brain and surrounding structures were obtained without intravenous contrast. COMPARISON:  Comparison made with prior CT from 05/19/2019. FINDINGS: Brain: Age-related cerebral volume loss. Patchy T2/FLAIR hyperintensity seen within the periventricular and deep white matter both cerebral hemispheres, most consistent with chronic small vessel ischemic disease,  moderate nature. Chronic microvascular ischemic changes present within the pons. Small remote left thalamic lacunar infarct noted. Patchy small volume cortical infarct measuring approximately 12 mm seen involving the posterior left frontal lobe, precentral gyrus (series 9, image 92). No associated hemorrhage or mass effect. No other evidence for acute or subacute ischemia. Gray-white matter differentiation otherwise maintained. No encephalomalacia to suggest chronic cortical infarction. No evidence for acute or chronic intracranial hemorrhage. No mass lesion, midline shift or mass effect. No hydrocephalus. No extra-axial fluid collection. Incidental note made of a partially empty sella. Midline structures intact. Vascular: Major intracranial vascular flow voids are maintained. Skull and upper cervical spine: Craniocervical junction within normal limits. Upper cervical spine normal. Bone marrow signal intensity within normal limits. No scalp soft tissue abnormality. Sinuses/Orbits: Globes and orbital soft tissues within normal limits. Mild scattered mucosal thickening noted throughout  the paranasal sinuses. Paranasal sinuses are otherwise clear. No significant mastoid effusion. Inner ear structures normal. Other: None. IMPRESSION: 1. 12 mm acute ischemic nonhemorrhagic posterior left frontal cortical infarct involving the precentral gyrus. No associated hemorrhage or mass effect. 2. Moderate chronic microvascular ischemic disease. 3. Small remote left thalamic lacunar infarct. Electronically Signed   By: Jeannine Boga M.D.   On: 05/20/2019 03:00    EKG: Independently reviewed.  Sinus rhythm at 78 bpm with prolonged QTC 498  Assessment/Plan Acute ischemic stroke: Patient presented with right-sided arm weakness starting last night around 6 PM.  MRI revealed acute 12 mm ischemic posterior left frontal cortical infarct.  Neurology has been formally consulted.  Patient was not a candidate for TPA.  Question possibility of arrhythmia or vegetation as a cause of symptoms. -Admit to telemetry bed -Stroke order set initiated -Neuro checks -Follow-up UDS -CTA of the head neck did not show any acute large vessel abnormalities. -Check echocardiogram -Check Hemoglobin A1c and lipid panel in a.m. -ASA 325 mg and atorvastatin 80 mg -PT/OT/Speech to eval and treat -Permissive hypertension -Social work consult  -[place] neurolog[facility] consultative services, will follow-up   Hypertensive urgency: On admission blood pressures elevated up to 208/125. -Allow for permissive hypertension with systolic blood pressure <161 or diastolic less than 096  Chronic systolic and diastolic congestive heart failure: Last available echocardiogram revealed EF was 20-25% in 2017.  Patient is not on any medications at this time and does not appear to be acutely fluid overloaded. -Follow-up echocardiogram  Prediabetes: On admission blood glucose is mildly elevated at 111.  He is not on any medications for treatment.  Last available hemoglobin A1c 4.8 back in 04/2017. -Hypoglycemic protocols -CBGs before  every meal and at bedtime with sensitive SSI  Hypokalemia: Acute.  Potassium mildly low at 3.4. -Give 20 mEq of potassium chloride p.o. x1 dose now -Continue to monitor and replace as needed  Chronic kidney disease stage II: On admission creatinine 1.54 which appears near patient's baseline on reviewing previous records. -Continue to monitor   Obesity:   DVT prophylaxis: Lovenox Code Status: Full Family Communication: No family requested to be updated Disposition Plan: Possible discharge home in 2 to 3 days once medically stable Consults called: Neurology Admission status: Inpatient  Norval Morton MD [facility] Pager [phone]   If 7PM-7AM, please contact night-coverage [email or web address] Password [facility]  05/20/2019, 7:44 AM

## 2019-05-20 NOTE — Progress Notes (Signed)
  Echocardiogram 2D Echocardiogram has been performed.  Jennette Dubin 05/20/2019, 11:40 AM

## 2019-05-20 NOTE — Evaluation (Signed)
Physical Therapy Evaluation Patient Details Name: Richard [name] MRN: [id number] DOB: [birth year] Today's Date: 05/20/2019   History of Present Illness  Richard [name] is a 48 y.o. male past medical history of systolic heart failure, CKD, diabetes, hypertension with a history of hypertensive emergency documented in the chart, obesity, who has not been taking his medications in an attempt to get rid of medications by more holistic and natural means, presented to the emergency room for sudden onset of right arm numbness and weakness on the evening of 05/19/2019.  MRI positive for small acute ischemic infarct in the posterior left frontal cortex in the precentral gyrus.  Clinical Impression  Patient presenting with mobility WNL and at his functional baseline.  Able to demonstrate stairs without rails with step through technique.  Reviewed stroke warning signs as medical emergency.  No further skilled PT needs.  Will sign off.     Follow Up Recommendations No PT follow up    Equipment Recommendations  None recommended by PT    Recommendations for Other Services       Precautions / Restrictions Precautions Precautions: None      Mobility  Bed Mobility               General bed mobility comments: up in chair  Transfers Overall transfer level: Independent                  Ambulation/Gait Ambulation/Gait assistance: Independent Gait Distance (Feet): 200 Feet Assistive device: None Gait Pattern/deviations: WFL(Within Functional Limits)        Stairs Stairs: Yes Stairs assistance: Independent Stair Management: No rails;Alternating pattern;Forwards Number of Stairs: 4    Wheelchair Mobility    Modified Rankin (Stroke Patients Only) Modified Rankin (Stroke Patients Only) Pre-Morbid Rankin Score: No symptoms Modified Rankin: Slight disability     Balance Overall balance assessment: Independent                               Standardized Balance  Assessment Standardized Balance Assessment : Dynamic Gait Index   Dynamic Gait Index Level Surface: Normal Change in Gait Speed: Normal Gait with Horizontal Head Turns: Normal Gait with Vertical Head Turns: Normal Gait and Pivot Turn: Normal Step Over Obstacle: Normal Step Around Obstacles: Normal Steps: Normal Total Score: 24       Pertinent Vitals/Pain Pain Assessment: No/denies pain    Home Living Family/patient expects to be discharged to:: Private residence Living Arrangements: Alone             Home Equipment: None Additional Comments: works for [employer]    Prior Function Level of Independence: Independent               Journalist, newspaper        Extremity/Trunk Assessment   Upper Extremity Assessment Upper Extremity Assessment: Overall WFL for tasks assessed    Lower Extremity Assessment Lower Extremity Assessment: Overall WFL for tasks assessed       Communication   Communication: No difficulties  Cognition Arousal/Alertness: Awake/alert Behavior During Therapy: WFL for tasks assessed/performed Overall Cognitive Status: Within Functional Limits for tasks assessed                                        General Comments General comments (skin integrity, edema, etc.): Discussed stroke warning signs and medical emergency; BP  after ambulation 188/114; RN aware    Exercises     Assessment/Plan    PT Assessment Patent does not need any further PT services  PT Problem List         PT Treatment Interventions      PT Goals (Current goals can be found in the Care Plan section)  Acute Rehab PT Goals PT Goal Formulation: All assessment and education complete, DC therapy    Frequency     Barriers to discharge        Co-evaluation               AM-PAC PT "6 Clicks" Mobility  Outcome Measure Help needed turning from your back to your side while in a flat bed without using bedrails?: None Help needed  moving from lying on your back to sitting on the side of a flat bed without using bedrails?: None Help needed moving to and from a bed to a chair (including a wheelchair)?: None Help needed standing up from a chair using your arms (e.g., wheelchair or bedside chair)?: None Help needed to walk in hospital room?: None Help needed climbing 3-5 steps with a railing? : None 6 Click Score: 24    End of Session   Activity Tolerance: Patient tolerated treatment well Patient left: in chair;with call bell/phone within reach Nurse Communication: Mobility status PT Visit Diagnosis: Other abnormalities of gait and mobility (R26.89)    Time: [id number] PT Time Calculation (min) (ACUTE ONLY): 13 min   Charges:   PT Evaluation $PT Eval Low Complexity: [place], [place] Acute Rehabilitation Services [phone] 05/20/2019   Reginia Naas 05/20/2019, 4:52 PM

## 2019-05-20 NOTE — ED Provider Notes (Signed)
[facility] EMERGENCY DEPARTMENT Provider Note   CSN: [id number] Arrival date & time: 05/19/19  2048     History Chief Complaint  Patient presents with  . Numbness    left arm    Richard [name] is a 48 y.o. male.  The history is provided by the patient.  Weakness Severity:  Moderate Onset quality:  Sudden Timing:  Constant Progression:  Improving Chronicity:  New Relieved by:  None tried Worsened by:  Nothing Associated symptoms: no fever, no headaches and no vision change   Patient with history of untreated blood pressure presents with right arm numbness and weakness.  Patient reports he went to sleep at approximately 1800 on December 15, and when he woke up his right arm was numb and mildly weak. No headache or visual changes.  No other weakness reported.  No other acute complaints.     Past Medical History:  Diagnosis Date  . Acute decompensated heart failure ([place]) 01/03/2013  . Allergy   . CHF (congestive heart failure) ([facility])    systolic  . Chronic kidney disease   . CKD (chronic kidney disease) stage 2, GFR 60-89 ml/min 01/04/2013  . DM (diabetes mellitus) ([place]) 01/06/2013  . Hypertension    hx htn emergency  . Obesity     Patient Active Problem List   Diagnosis Date Noted  . Acute ischemic stroke ([place]) 05/20/2019  . Chronic combined systolic and diastolic CHF, NYHA class 2 ([place]) 10/06/2015  . CKD (chronic kidney disease), stage III 10/06/2015  . Diastolic dysfunction 00/86/7619  . Obesity   . Type 2 diabetes, uncontrolled, with renal manifestation ([place]) 02/17/2013  . Essential hypertension, benign 01/15/2013    Past Surgical History:  Procedure Laterality Date  . LEFT AND RIGHT HEART CATHETERIZATION WITH CORONARY ANGIOGRAM N/A 01/07/2013   Procedure: LEFT AND RIGHT HEART CATHETERIZATION WITH CORONARY ANGIOGRAM;  Surgeon: Birdie Riddle, MD;  Location: [place] CATH LAB;  Service: Cardiovascular;  Laterality: N/A;       Family History    Problem Relation Age of Onset  . Heart disease Father 62       MI  . Hypertension Father   . Hyperlipidemia Father   . Diabetes Mother   . Hypertension Mother     Social History   Tobacco Use  . Smoking status: Never Smoker  . Smokeless tobacco: Never Used  Substance Use Topics  . Alcohol use: No  . Drug use: No    Home Medications Prior to Admission medications   Medication Sig Start Date End Date Taking? Authorizing Provider  Blood Glucose Monitoring Suppl (ACURA BLOOD GLUCOSE METER) W/DEVICE KIT 1 meter with lancets and test strips for daily BS testing QS 1 month 01/10/13   Geradine Girt, DO  carvedilol (COREG) 25 MG tablet Take 1 tablet (25 mg total) by mouth 2 (two) times daily with a meal. 01/28/17   Arbutus Leas, NP  furosemide (LASIX) 40 MG tablet Take 1 tablet (40 mg total) by mouth daily. 01/28/17   Arbutus Leas, NP  furosemide (LASIX) 40 MG tablet TAKE ONE TABLET BY MOUTH TWICE DAILY 04/23/17   Larey Dresser, MD  glipiZIDE (GLUCOTROL) 5 MG tablet TAKE ONE TABLET BY MOUTH TWICE DAILY BEFORE MEAL(S) 04/12/17   Colin Benton R, DO  glucose blood (ONE TOUCH ULTRA TEST) test strip Use as directed to check blood sugar once a day. 10/01/15   Almyra Deforest, PA  isosorbide-hydrALAZINE (BIDIL) 20-37.5 MG tablet Take 2 tablets  by mouth 3 (three) times daily. 01/28/17   Arbutus Leas, NP  LevOCARNitine (L-CARNITINE) 250 MG CAPS Take 250 mg by mouth daily.    [provider]  magnesium 30 MG tablet Take 30 mg by mouth 2 (two) times daily.    [provider]  metFORMIN (GLUCOPHAGE) 500 MG tablet Take 1 tablet (500 mg total) 2 (two) times daily with a meal by mouth. 04/12/17   Lucretia Kern, DO  Multiple Vitamin (MULTIVITAMIN WITH MINERALS) TABS tablet Take 1 tablet by mouth daily.    [provider]  Omega-3 Fatty Acids (FISH OIL) 1000 MG CAPS Take 1 capsule by mouth daily.    [provider]  [facility] DELICA LANCETS 45T MISC 1 Device by Other route  daily. Use as directed for glucose testing once a day. 10/01/15   Almyra Deforest, PA  potassium chloride SA (K-DUR,KLOR-CON) 20 MEQ tablet Take 2 tablets (40 mEq total) by mouth daily. 01/28/17   Arbutus Leas, NP  sacubitril-valsartan (ENTRESTO) 97-103 MG Take 1 tablet by mouth 2 (two) times daily. 02/27/17   Larey Dresser, MD  spironolactone (ALDACTONE) 25 MG tablet Take 1 tablet (25 mg total) by mouth daily. 01/28/17   Arbutus Leas, NP    Allergies    Patient has no known allergies.  Review of Systems   Review of Systems  Constitutional: Negative for fever.  Eyes: Negative for visual disturbance.  Neurological: Positive for weakness and numbness. Negative for headaches.  All other systems reviewed and are negative.   Physical Exam Updated Vital Signs BP (!) 185/122   Pulse 75   Temp 98.1 F (36.7 C) (Oral)   Resp (!) 25   SpO2 99%   Physical Exam CONSTITUTIONAL: Well developed/well nourished HEAD: Normocephalic/atraumatic EYES: EOMI/PERRL, no nystagmus, no visual field deficit  no ptosis ENMT: Mucous membranes moist NECK: supple no meningeal signs, no bruits CV: S1/S2 noted, no murmurs/rubs/gallops noted LUNGS: Lungs are clear to auscultation bilaterally, no apparent distress ABDOMEN: soft, nontender, no rebound or guarding GU:no cva tenderness NEURO:Awake/alert, face symmetric, no arm or leg drift is noted Equal 5/5 strength with shoulder abduction Equal 5/5 strength with hip flexion,knee flex/extension, foot dorsi/plantar flexion Cranial nerves 3/4/5/6/12/10/08/11/12 tested and intact No past pointing Pt reports mild numbness to right forearm.  ?decreased hand grip on right EXTREMITIES: pulses normal, full ROM SKIN: warm, color normal PSYCH: no abnormalities of mood noted  ED Results / Procedures / Treatments   Labs (all labs ordered are listed, but only abnormal results are displayed) Labs Reviewed  COMPREHENSIVE METABOLIC PANEL - Abnormal; Notable for the following  components:      Result Value   Potassium 3.4 (*)    Glucose, Bld 111 (*)    Creatinine, Ser 1.54 (*)    GFR calc non Af Amer 52 (*)    All other components within normal limits  SARS CORONAVIRUS 2 (TAT 6-24 HRS)  PROTIME-INR  APTT  CBC  DIFFERENTIAL  RAPID URINE DRUG SCREEN, HOSP PERFORMED  ETHANOL    EKG EKG Interpretation  Date/Time:  Wednesday May 20 2019 05:57:00 EST Ventricular Rate:  78 PR Interval:    QRS Duration: 89 QT Interval:  433 QTC Calculation: 494 R Axis:   84 Text Interpretation: Sinus rhythm Probable left atrial enlargement Anteroseptal infarct, old Nonspecific T abnormalities, lateral leads Confirmed by Ripley Fraise [phone]) on 05/20/2019 6:23:39 AM   Radiology CT HEAD WO CONTRAST  Result Date: 05/19/2019 CLINICAL DATA:  Woke  from sleep with left arm weakness, possibly slept on arm. EXAM: CT HEAD WITHOUT CONTRAST TECHNIQUE: Contiguous axial images were obtained from the base of the skull through the vertex without intravenous contrast. COMPARISON:  None. FINDINGS: Brain: Question a subtle 9 mm hyperattenuating structure seen in the left anterolateral pontomedullary junction (2/9). No adjacent edema. No associated mass effect. No convincing CT evidence of acute infarction, hemorrhage, hydrocephalus, or extra-axial collection. Vascular: Atherosclerotic calcification of the carotid siphons and intradural vertebral arteries. No hyperdense vessel. Skull: No calvarial fracture or suspicious osseous lesion. No scalp swelling or hematoma. Sinuses/Orbits: Paranasal sinuses and mastoid air cells are predominantly clear. Included orbital structures are unremarkable. Other: None IMPRESSION: 1. Question a subtle 9 mm hyperattenuating structure in the left anterolateral pontomedullary junction. No adjacent edema. No associated mass effect. This may represent a small cavernous malformation, however, given the patient's symptoms, recommend MRI with and without contrast  for further characterization. 2. No other acute intracranial abnormality. 3. Atherosclerosis of the carotid siphons and intradural vertebral arteries. Electronically Signed   By: Lovena Le M.D.   On: 05/19/2019 21:43   MR BRAIN WO CONTRAST  Result Date: 05/20/2019 CLINICAL DATA:  Initial evaluation for acute weakness, left upper extremity numbness. EXAM: MRI HEAD WITHOUT CONTRAST TECHNIQUE: Multiplanar, multiecho pulse sequences of the brain and surrounding structures were obtained without intravenous contrast. COMPARISON:  Comparison made with prior CT from 05/19/2019. FINDINGS: Brain: Age-related cerebral volume loss. Patchy T2/FLAIR hyperintensity seen within the periventricular and deep white matter both cerebral hemispheres, most consistent with chronic small vessel ischemic disease, moderate nature. Chronic microvascular ischemic changes present within the pons. Small remote left thalamic lacunar infarct noted. Patchy small volume cortical infarct measuring approximately 12 mm seen involving the posterior left frontal lobe, precentral gyrus (series 9, image 92). No associated hemorrhage or mass effect. No other evidence for acute or subacute ischemia. Gray-white matter differentiation otherwise maintained. No encephalomalacia to suggest chronic cortical infarction. No evidence for acute or chronic intracranial hemorrhage. No mass lesion, midline shift or mass effect. No hydrocephalus. No extra-axial fluid collection. Incidental note made of a partially empty sella. Midline structures intact. Vascular: Major intracranial vascular flow voids are maintained. Skull and upper cervical spine: Craniocervical junction within normal limits. Upper cervical spine normal. Bone marrow signal intensity within normal limits. No scalp soft tissue abnormality. Sinuses/Orbits: Globes and orbital soft tissues within normal limits. Mild scattered mucosal thickening noted throughout the paranasal sinuses. Paranasal sinuses  are otherwise clear. No significant mastoid effusion. Inner ear structures normal. Other: None. IMPRESSION: 1. 12 mm acute ischemic nonhemorrhagic posterior left frontal cortical infarct involving the precentral gyrus. No associated hemorrhage or mass effect. 2. Moderate chronic microvascular ischemic disease. 3. Small remote left thalamic lacunar infarct. Electronically Signed   By: Jeannine Boga M.D.   On: 05/20/2019 03:00    Procedures Procedures    Medications Ordered in ED Medications  aspirin suppository 300 mg (has no administration in time range)    Or  aspirin tablet 325 mg (has no administration in time range)  atorvastatin (LIPITOR) tablet 80 mg (has no administration in time range)  labetalol (NORMODYNE) injection 10 mg (has no administration in time range)    ED Course  I have reviewed the triage vital signs and the nursing notes.  Pertinent labs & imaging results that were available during my care of the patient were reviewed by me and considered in my medical decision making (see chart for details).    MDM Rules/Calculators/A&P  6:45 AM Patient with abnormal MRI findings of ischemic infarct of left frontal cortex.  Patient reported numbness and weakness of the right arm that occurred while sleeping.  He now reports he is feeling improved.  On my assessment, Exam is essentially unremarkable except for mild numbness to the right arm  Consulted Dr. Rory Percy with neurology who will see the patient.  He recommends admission to the hospitalist.  Discussed with Dr. Myna Hidalgo for admission Final Clinical Impression(s) / ED Diagnoses Final diagnoses:  Cerebrovascular accident (CVA), unspecified mechanism [facility])    Rx / [place] Orders ED Discharge Orders    None       Ripley Fraise, MD 05/20/19 [phone]

## 2019-05-20 NOTE — Progress Notes (Addendum)
STROKE TEAM PROGRESS NOTE   INTERVAL HISTORY Patient sitting in chair, stated that his symptoms all resolved last night.  Yesterday, he was having difficulty reaching a cup, holding a cup, around 6 PM.  By midnight, his symptoms all resolved.  MRI showed small left frontal cortical infarct.  Stroke work-up underway.  Vitals:   05/20/19 0630 05/20/19 0700 05/20/19 0953 05/20/19 1125  BP: (!) 168/114 (!) 159/110 (!) 193/122 (!) 179/107  Pulse: 78 71 76 83  Resp: (!) 23 (!) 27 (!) 29 16  Temp:      TempSrc:      SpO2: 97% 98% 100% 100%    CBC:  Recent Labs  Lab 05/19/19 2104  WBC 6.2  NEUTROABS 3.7  HGB 16.2  HCT 49.5  MCV 87.3  PLT [id number]    Basic Metabolic Panel:  Recent Labs  Lab 05/19/19 2104  NA 141  K 3.4*  CL 104  CO2 26  GLUCOSE 111*  BUN 12  CREATININE 1.54*  CALCIUM 9.2   Lipid Panel:     Component Value Date/Time   CHOL 268 (H) 05/20/2019 0800   TRIG 104 05/20/2019 0800   HDL 51 05/20/2019 0800   CHOLHDL 5.3 05/20/2019 0800   VLDL 21 05/20/2019 0800   LDLCALC 196 (H) 05/20/2019 0800   HgbA1c:  Lab Results  Component Value Date   HGBA1C 5.8 (H) 05/20/2019   Urine Drug Screen: No results found for: LABOPIA, COCAINSCRNUR, LABBENZ, AMPHETMU, THCU, LABBARB  Alcohol Level     Component Value Date/Time   ETH <10 05/20/2019 0606    IMAGING CT ANGIO HEAD W OR WO CONTRAST  Result Date: 05/20/2019 CLINICAL DATA:  Right arm weakness and numbness, now resolved EXAM: CT ANGIOGRAPHY HEAD AND NECK TECHNIQUE: Multidetector CT imaging of the head and neck was performed using the standard protocol during bolus administration of intravenous contrast. Multiplanar CT image reconstructions and MIPs were obtained to evaluate the vascular anatomy. Carotid stenosis measurements (when applicable) are obtained utilizing NASCET criteria, using the distal internal carotid diameter as the denominator. CONTRAST:  74mL OMNIPAQUE IOHEXOL 350 MG/ML SOLN COMPARISON:  None.  FINDINGS: CTA NECK FINDINGS Aortic arch: Great vessel origins are patent. Right carotid system: Common, internal, and external carotid arteries are patent. There is minimal calcified plaque at the bifurcation. No hemodynamically significant stenosis or evidence of dissection. Left carotid system: Common, internal, and external carotid arteries are patent. There is mild eccentric noncalcified plaque at the bifurcation and proximal ICA without measurable stenosis. No hemodynamically significant stenosis or evidence of dissection. Vertebral arteries: Patent and codominant. Partially obscured proximally by artifact. No significant stenosis or evidence of dissection. Skeleton: Unremarkable. Other neck: Enlarged, multinodular thyroid. Upper chest: No apical lung mass. Review of the MIP images confirms the above findings CTA HEAD FINDINGS Anterior circulation: Intracranial internal carotid arteries are patent with mild calcified plaque. Anterior and middle cerebral arteries are patent. No significant stenosis. Posterior circulation: Intracranial vertebral arteries, basilar artery, and posterior cerebral arteries are patent. There are bilateral posterior communicating arteries. No significant stenosis. Venous sinuses: As permitted by contrast timing, patent. Anatomic variants: Congenitally absent right A1 ACA. Review of the MIP images confirms the above findings IMPRESSION: No large vessel occlusion or hemodynamically significant stenosis. Electronically Signed   By: Macy Mis M.D.   On: 05/20/2019 08:06   DG Chest 2 View  Result Date: 05/20/2019 CLINICAL DATA:  Left arm numbness and weakness. EXAM: CHEST - 2 VIEW COMPARISON:  September 26, 2015  FINDINGS: The heart size is borderline to mildly enlarged. The hila and mediastinum are normal. No pneumothorax. No nodules or masses. No focal infiltrates. No overt edema. IMPRESSION: No active cardiopulmonary disease. Electronically Signed   By: Dorise Bullion III M.D   On:  05/20/2019 08:50   CT HEAD WO CONTRAST  Result Date: 05/19/2019 CLINICAL DATA:  Woke from sleep with left arm weakness, possibly slept on arm. EXAM: CT HEAD WITHOUT CONTRAST TECHNIQUE: Contiguous axial images were obtained from the base of the skull through the vertex without intravenous contrast. COMPARISON:  None. FINDINGS: Brain: Question a subtle 9 mm hyperattenuating structure seen in the left anterolateral pontomedullary junction (2/9). No adjacent edema. No associated mass effect. No convincing CT evidence of acute infarction, hemorrhage, hydrocephalus, or extra-axial collection. Vascular: Atherosclerotic calcification of the carotid siphons and intradural vertebral arteries. No hyperdense vessel. Skull: No calvarial fracture or suspicious osseous lesion. No scalp swelling or hematoma. Sinuses/Orbits: Paranasal sinuses and mastoid air cells are predominantly clear. Included orbital structures are unremarkable. Other: None IMPRESSION: 1. Question a subtle 9 mm hyperattenuating structure in the left anterolateral pontomedullary junction. No adjacent edema. No associated mass effect. This may represent a small cavernous malformation, however, given the patient's symptoms, recommend MRI with and without contrast for further characterization. 2. No other acute intracranial abnormality. 3. Atherosclerosis of the carotid siphons and intradural vertebral arteries. Electronically Signed   By: Lovena Le M.D.   On: 05/19/2019 21:43   CT ANGIO NECK W OR WO CONTRAST  Result Date: 05/20/2019 CLINICAL DATA:  Right arm weakness and numbness, now resolved EXAM: CT ANGIOGRAPHY HEAD AND NECK TECHNIQUE: Multidetector CT imaging of the head and neck was performed using the standard protocol during bolus administration of intravenous contrast. Multiplanar CT image reconstructions and MIPs were obtained to evaluate the vascular anatomy. Carotid stenosis measurements (when applicable) are obtained utilizing NASCET  criteria, using the distal internal carotid diameter as the denominator. CONTRAST:  82mL OMNIPAQUE IOHEXOL 350 MG/ML SOLN COMPARISON:  None. FINDINGS: CTA NECK FINDINGS Aortic arch: Great vessel origins are patent. Right carotid system: Common, internal, and external carotid arteries are patent. There is minimal calcified plaque at the bifurcation. No hemodynamically significant stenosis or evidence of dissection. Left carotid system: Common, internal, and external carotid arteries are patent. There is mild eccentric noncalcified plaque at the bifurcation and proximal ICA without measurable stenosis. No hemodynamically significant stenosis or evidence of dissection. Vertebral arteries: Patent and codominant. Partially obscured proximally by artifact. No significant stenosis or evidence of dissection. Skeleton: Unremarkable. Other neck: Enlarged, multinodular thyroid. Upper chest: No apical lung mass. Review of the MIP images confirms the above findings CTA HEAD FINDINGS Anterior circulation: Intracranial internal carotid arteries are patent with mild calcified plaque. Anterior and middle cerebral arteries are patent. No significant stenosis. Posterior circulation: Intracranial vertebral arteries, basilar artery, and posterior cerebral arteries are patent. There are bilateral posterior communicating arteries. No significant stenosis. Venous sinuses: As permitted by contrast timing, patent. Anatomic variants: Congenitally absent right A1 ACA. Review of the MIP images confirms the above findings IMPRESSION: No large vessel occlusion or hemodynamically significant stenosis. Electronically Signed   By: Macy Mis M.D.   On: 05/20/2019 08:06   MR BRAIN WO CONTRAST  Result Date: 05/20/2019 CLINICAL DATA:  Initial evaluation for acute weakness, left upper extremity numbness. EXAM: MRI HEAD WITHOUT CONTRAST TECHNIQUE: Multiplanar, multiecho pulse sequences of the brain and surrounding structures were obtained  without intravenous contrast. COMPARISON:  Comparison made with prior  CT from 05/19/2019. FINDINGS: Brain: Age-related cerebral volume loss. Patchy T2/FLAIR hyperintensity seen within the periventricular and deep white matter both cerebral hemispheres, most consistent with chronic small vessel ischemic disease, moderate nature. Chronic microvascular ischemic changes present within the pons. Small remote left thalamic lacunar infarct noted. Patchy small volume cortical infarct measuring approximately 12 mm seen involving the posterior left frontal lobe, precentral gyrus (series 9, image 92). No associated hemorrhage or mass effect. No other evidence for acute or subacute ischemia. Gray-white matter differentiation otherwise maintained. No encephalomalacia to suggest chronic cortical infarction. No evidence for acute or chronic intracranial hemorrhage. No mass lesion, midline shift or mass effect. No hydrocephalus. No extra-axial fluid collection. Incidental note made of a partially empty sella. Midline structures intact. Vascular: Major intracranial vascular flow voids are maintained. Skull and upper cervical spine: Craniocervical junction within normal limits. Upper cervical spine normal. Bone marrow signal intensity within normal limits. No scalp soft tissue abnormality. Sinuses/Orbits: Globes and orbital soft tissues within normal limits. Mild scattered mucosal thickening noted throughout the paranasal sinuses. Paranasal sinuses are otherwise clear. No significant mastoid effusion. Inner ear structures normal. Other: None. IMPRESSION: 1. 12 mm acute ischemic nonhemorrhagic posterior left frontal cortical infarct involving the precentral gyrus. No associated hemorrhage or mass effect. 2. Moderate chronic microvascular ischemic disease. 3. Small remote left thalamic lacunar infarct. Electronically Signed   By: Jeannine Boga M.D.   On: 05/20/2019 03:00   VAS [place] LOWER EXTREMITY VENOUS (DVT)  Result Date:  05/20/2019  Lower Venous Study Indications: Stroke.  Comparison Study: 09/27/15 previous Performing Technologist: Abram Sander RVS  Examination Guidelines: A complete evaluation includes B-mode imaging, spectral Doppler, color Doppler, and power Doppler as needed of all accessible portions of each vessel. Bilateral testing is considered an integral part of a complete examination. Limited examinations for reoccurring indications may be performed as noted.  +---------+---------------+---------+-----------+----------+--------------+ RIGHT    CompressibilityPhasicitySpontaneityPropertiesThrombus Aging +---------+---------------+---------+-----------+----------+--------------+ CFV      Full           Yes      Yes                                 +---------+---------------+---------+-----------+----------+--------------+ SFJ      Full                                                        +---------+---------------+---------+-----------+----------+--------------+ FV Prox  Full                                                        +---------+---------------+---------+-----------+----------+--------------+ FV Mid   Full                                                        +---------+---------------+---------+-----------+----------+--------------+ FV DistalFull                                                        +---------+---------------+---------+-----------+----------+--------------+  PFV      Full                                                        +---------+---------------+---------+-----------+----------+--------------+ POP      Full           Yes      Yes                                 +---------+---------------+---------+-----------+----------+--------------+ PTV      Full                                                        +---------+---------------+---------+-----------+----------+--------------+ PERO                                                   Not visualized +---------+---------------+---------+-----------+----------+--------------+   +---------+---------------+---------+-----------+----------+--------------+ LEFT     CompressibilityPhasicitySpontaneityPropertiesThrombus Aging +---------+---------------+---------+-----------+----------+--------------+ CFV      Full           Yes      Yes                                 +---------+---------------+---------+-----------+----------+--------------+ SFJ      Full                                                        +---------+---------------+---------+-----------+----------+--------------+ FV Prox  Full                                                        +---------+---------------+---------+-----------+----------+--------------+ FV Mid   Full                                                        +---------+---------------+---------+-----------+----------+--------------+ FV DistalFull                                                        +---------+---------------+---------+-----------+----------+--------------+ PFV      Full                                                        +---------+---------------+---------+-----------+----------+--------------+  POP      Full           Yes      Yes                                 +---------+---------------+---------+-----------+----------+--------------+ PTV      Full                                                        +---------+---------------+---------+-----------+----------+--------------+ PERO                                                  Not visualized +---------+---------------+---------+-----------+----------+--------------+     Summary: Right: There is no evidence of deep vein thrombosis in the lower extremity. No cystic structure found in the popliteal fossa. Left: There is no evidence of deep vein thrombosis in the lower extremity. No cystic structure found in  the popliteal fossa.  *See table(s) above for measurements and observations.    Preliminary     PHYSICAL EXAM  Temp:  [98.1 F (36.7 C)] 98.1 F (36.7 C) (12/16 0050) Pulse Rate:  [70-85] 83 (12/16 1125) Resp:  [16-29] 16 (12/16 1125) BP: (159-208)/(107-131) 179/107 (12/16 1125) SpO2:  [97 %-100 %] 100 % (12/16 1125)  General - obese, well developed, in no apparent distress.  Ophthalmologic - fundi not visualized due to noncooperation.  Cardiovascular - Regular rhythm and rate.  Mental Status -  Level of arousal and orientation to time, place, and person were intact. Language including expression, naming, repetition, comprehension was assessed and found intact. Attention span and concentration were normal. Fund of Knowledge was assessed and was intact.  Cranial Nerves II - XII - II - Visual field intact OU. III, IV, VI - Extraocular movements intact. V - Facial sensation intact bilaterally. VII - Facial movement intact bilaterally. VIII - Hearing & vestibular intact bilaterally. X - Palate elevates symmetrically. XI - Chin turning & shoulder shrug intact bilaterally. XII - Tongue protrusion intact.  Motor Strength - The patient's strength was normal in all extremities and pronator drift was absent.  Bulk was normal and fasciculations were absent.   Motor Tone - Muscle tone was assessed at the neck and appendages and was normal.  Reflexes - The patient's reflexes were symmetrical in all extremities and he had no pathological reflexes.  Sensory - Light touch, temperature/pinprick were assessed and were symmetrical.    Coordination - The patient had normal movements in the hands and feet with no ataxia or dysmetria.  Tremor was absent.  Gait and Station - deferred.   ASSESSMENT/PLAN [name] is a 48 y.o. male with history of systolic heart failure, CKD, diabetes, hypertension with a history of hypertensive emergency documented in the chart, obesity, who has not  been taking his medications in an attempt to get rid of medications by more holistic and natural means who woke from a nap with R arm weakness, finally presenting to ED with it resolving.   Stroke:  L frontal lobe infarct embolic secondary to unclear source  CT head L pontomedullary jxn hypoattenuation, ? Infarct. No other acute abnormality.  ICA and VA atherosclerosis  MRI  Posterior L frontal lobe cortical infarct (precentral gyrus). Moderate small vessel disease. Small old L thalamic lacune.   CTA head & neck no LVO or significant stenosis   2D Echo pending  Hypercoagulable labs pending  LE dopplers neg DVT  Recommend TEE to look for embolic source. Arranged with [place] for tomorrow.  (I have made patient NPO after midnight tonight).  If TEE negative, a [place] electrophysiologist will consult and consider placement of an implantable loop recorder to evaluate for atrial fibrillation as etiology of stroke. This has been explained to patient/family by Dr. Erlinda Hong and they are agreeable.   LDL 196  HgbA1c 5.8  Lovenox 40 mg sq daily for VTE prophylaxis  No antithrombotic prior to admission, now on aspirin 325 mg daily.  Recommend aspirin 81 and Plavix 75 DAPT for 3 weeks and then aspirin alone.  Therapy recommendations:  pending   Disposition:  pending   Hypertension . BP 200s on presentation  . Permissive hypertension (OK if < 220/120) but gradually normalize in 3-5 days . Long-term BP goal normotensive  Hyperlipidemia  Home meds:  No statin  Now on lipitor 80  LDL 196, goal < 70  Continue statin at discharge  Diabetes type II Controlled  HgbA1c 5.8, goal < 7.0  CBGs  SSI  Continue PCP follow-up  History of cardiomyopathy  Hx chronic combined Congestive heart failure  EF 20-25% in April 2017, improved to 50-555% Oct 2017.   Not compliant with Entresto  TTE/TEE pending  Other Stroke Risk  Factors  Obesity, recommend weight loss, diet and exercise as appropriate   Other Active Problems  CKD stage IIIa, creatinine 1.54  Hypokalemia, potassium 3.4  Hospital day # 0  Rosalin Hawking, MD PhD Stroke Neurology 05/20/2019 1:19 PM    To contact Stroke Continuity provider, please refer to [email or web address] After hours, contact General Neurology

## 2019-05-21 ENCOUNTER — Encounter (HOSPITAL_COMMUNITY)
Admission: EM | Disposition: A | Discharge status: Home / Self Care | Payer: Self-pay | Source: Home / Self Care | Attending: Internal Medicine

## 2019-05-21 DIAGNOSIS — E876 Hypokalemia: Secondary | ICD-10-CM

## 2019-05-21 DIAGNOSIS — I160 Hypertensive urgency: Secondary | ICD-10-CM

## 2019-05-21 DIAGNOSIS — E6609 Other obesity due to excess calories: Secondary | ICD-10-CM

## 2019-05-21 DIAGNOSIS — I5042 Chronic combined systolic (congestive) and diastolic (congestive) heart failure: Secondary | ICD-10-CM

## 2019-05-21 DIAGNOSIS — N182 Chronic kidney disease, stage 2 (mild): Secondary | ICD-10-CM

## 2019-05-21 LAB — BASIC METABOLIC PANEL
Anion gap: 13 (ref 5–15)
BUN: 15 mg/dL (ref 6–20)
CO2: 24 mmol/L (ref 22–32)
Calcium: 8.5 mg/dL — ABNORMAL LOW (ref 8.9–10.3)
Chloride: 106 mmol/L (ref 98–111)
Creatinine, Ser: 1.69 mg/dL — ABNORMAL HIGH (ref 0.61–1.24)
GFR calc Af Amer: 54 mL/min — ABNORMAL LOW (ref >60)
GFR calc non Af Amer: 47 mL/min — ABNORMAL LOW (ref >60)
Glucose, Bld: 135 mg/dL — ABNORMAL HIGH (ref 70–99)
Potassium: 3 mmol/L — ABNORMAL LOW (ref 3.5–5.1)
Sodium: 143 mmol/L (ref 135–145)

## 2019-05-21 LAB — VITAMIN B12: Vitamin B-12: 257 pg/mL (ref 180–914)

## 2019-05-21 LAB — LUPUS ANTICOAGULANT PANEL
DRVVT: 37.7 s (ref 0.0–47.0)
PTT Lupus Anticoagulant: 33 s (ref 0.0–51.9)

## 2019-05-21 LAB — MAGNESIUM: Magnesium: 2 mg/dL (ref 1.7–2.4)

## 2019-05-21 SURGERY — ECHOCARDIOGRAM, TRANSESOPHAGEAL
Anesthesia: General

## 2019-05-21 MED ORDER — SODIUM CHLORIDE 0.9 % IV SOLN: INTRAVENOUS | Status: DC

## 2019-05-21 MED ORDER — POTASSIUM CHLORIDE CRYS ER 20 MEQ PO TBCR
40.0000 meq | EXTENDED_RELEASE_TABLET | Freq: Two times a day (BID) | ORAL | Status: AC
  Administered 2019-05-21 (×2): 40 meq via ORAL
  Filled 2019-05-21 (×2): qty 2

## 2019-05-21 NOTE — Progress Notes (Signed)
Patient BP 178/128. PRN labetalol given. BP recheck at 1109, BP now 171/117. MD Maylene Roes notified. [place]

## 2019-05-21 NOTE — Progress Notes (Signed)
STROKE TEAM PROGRESS NOTE   INTERVAL HISTORY Pt sitting in chair, no family at bedside. Pending TEE and loop tomorrow. No acute event overnight.    Vitals:   05/21/19 0441 05/21/19 0947 05/21/19 1109 05/21/19 1140  BP: (!) 155/104 (!) 178/128 (!) 171/117 (!) 165/111  Pulse: 75 77 75 78  Resp: 16 18  (!) 24  Temp: 97.8 F (36.6 C) 98.4 F (36.9 C)  98.2 F (36.8 C)  TempSrc: Oral Oral  Oral  SpO2: 100% 100%  99%    CBC:  Recent Labs  Lab 05/19/19 2104  WBC 6.2  NEUTROABS 3.7  HGB 16.2  HCT 49.5  MCV 87.3  PLT [id number]    Basic Metabolic Panel:  Recent Labs  Lab 05/19/19 2104 05/21/19 0227  NA 141 143  K 3.4* 3.0*  CL 104 106  CO2 26 24  GLUCOSE 111* 135*  BUN 12 15  CREATININE 1.54* 1.69*  CALCIUM 9.2 8.5*  MG  --  2.0   Lipid Panel:     Component Value Date/Time   CHOL 268 (H) 05/20/2019 0800   TRIG 104 05/20/2019 0800   HDL 51 05/20/2019 0800   CHOLHDL 5.3 05/20/2019 0800   VLDL 21 05/20/2019 0800   LDLCALC 196 (H) 05/20/2019 0800   HgbA1c:  Lab Results  Component Value Date   HGBA1C 5.8 (H) 05/20/2019   Urine Drug Screen: No results found for: LABOPIA, COCAINSCRNUR, LABBENZ, AMPHETMU, THCU, LABBARB  Alcohol Level     Component Value Date/Time   ETH <10 05/20/2019 0606    IMAGING CT ANGIO HEAD W OR WO CONTRAST  Result Date: 05/20/2019 CLINICAL DATA:  Right arm weakness and numbness, now resolved EXAM: CT ANGIOGRAPHY HEAD AND NECK TECHNIQUE: Multidetector CT imaging of the head and neck was performed using the standard protocol during bolus administration of intravenous contrast. Multiplanar CT image reconstructions and MIPs were obtained to evaluate the vascular anatomy. Carotid stenosis measurements (when applicable) are obtained utilizing NASCET criteria, using the distal internal carotid diameter as the denominator. CONTRAST:  51mL OMNIPAQUE IOHEXOL 350 MG/ML SOLN COMPARISON:  None. FINDINGS: CTA NECK FINDINGS Aortic arch: Great vessel origins are  patent. Right carotid system: Common, internal, and external carotid arteries are patent. There is minimal calcified plaque at the bifurcation. No hemodynamically significant stenosis or evidence of dissection. Left carotid system: Common, internal, and external carotid arteries are patent. There is mild eccentric noncalcified plaque at the bifurcation and proximal ICA without measurable stenosis. No hemodynamically significant stenosis or evidence of dissection. Vertebral arteries: Patent and codominant. Partially obscured proximally by artifact. No significant stenosis or evidence of dissection. Skeleton: Unremarkable. Other neck: Enlarged, multinodular thyroid. Upper chest: No apical lung mass. Review of the MIP images confirms the above findings CTA HEAD FINDINGS Anterior circulation: Intracranial internal carotid arteries are patent with mild calcified plaque. Anterior and middle cerebral arteries are patent. No significant stenosis. Posterior circulation: Intracranial vertebral arteries, basilar artery, and posterior cerebral arteries are patent. There are bilateral posterior communicating arteries. No significant stenosis. Venous sinuses: As permitted by contrast timing, patent. Anatomic variants: Congenitally absent right A1 ACA. Review of the MIP images confirms the above findings IMPRESSION: No large vessel occlusion or hemodynamically significant stenosis. Electronically Signed   By: Macy Mis M.D.   On: 05/20/2019 08:06   DG Chest 2 View  Result Date: 05/20/2019 CLINICAL DATA:  Left arm numbness and weakness. EXAM: CHEST - 2 VIEW COMPARISON:  September 26, 2015 FINDINGS: The heart size  is borderline to mildly enlarged. The hila and mediastinum are normal. No pneumothorax. No nodules or masses. No focal infiltrates. No overt edema. IMPRESSION: No active cardiopulmonary disease. Electronically Signed   By: Dorise Bullion III M.D   On: 05/20/2019 08:50   CT HEAD WO CONTRAST  Result Date:  05/19/2019 CLINICAL DATA:  Woke from sleep with left arm weakness, possibly slept on arm. EXAM: CT HEAD WITHOUT CONTRAST TECHNIQUE: Contiguous axial images were obtained from the base of the skull through the vertex without intravenous contrast. COMPARISON:  None. FINDINGS: Brain: Question a subtle 9 mm hyperattenuating structure seen in the left anterolateral pontomedullary junction (2/9). No adjacent edema. No associated mass effect. No convincing CT evidence of acute infarction, hemorrhage, hydrocephalus, or extra-axial collection. Vascular: Atherosclerotic calcification of the carotid siphons and intradural vertebral arteries. No hyperdense vessel. Skull: No calvarial fracture or suspicious osseous lesion. No scalp swelling or hematoma. Sinuses/Orbits: Paranasal sinuses and mastoid air cells are predominantly clear. Included orbital structures are unremarkable. Other: None IMPRESSION: 1. Question a subtle 9 mm hyperattenuating structure in the left anterolateral pontomedullary junction. No adjacent edema. No associated mass effect. This may represent a small cavernous malformation, however, given the patient's symptoms, recommend MRI with and without contrast for further characterization. 2. No other acute intracranial abnormality. 3. Atherosclerosis of the carotid siphons and intradural vertebral arteries. Electronically Signed   By: Lovena Le M.D.   On: 05/19/2019 21:43   CT ANGIO NECK W OR WO CONTRAST  Result Date: 05/20/2019 CLINICAL DATA:  Right arm weakness and numbness, now resolved EXAM: CT ANGIOGRAPHY HEAD AND NECK TECHNIQUE: Multidetector CT imaging of the head and neck was performed using the standard protocol during bolus administration of intravenous contrast. Multiplanar CT image reconstructions and MIPs were obtained to evaluate the vascular anatomy. Carotid stenosis measurements (when applicable) are obtained utilizing NASCET criteria, using the distal internal carotid diameter as the  denominator. CONTRAST:  66mL OMNIPAQUE IOHEXOL 350 MG/ML SOLN COMPARISON:  None. FINDINGS: CTA NECK FINDINGS Aortic arch: Great vessel origins are patent. Right carotid system: Common, internal, and external carotid arteries are patent. There is minimal calcified plaque at the bifurcation. No hemodynamically significant stenosis or evidence of dissection. Left carotid system: Common, internal, and external carotid arteries are patent. There is mild eccentric noncalcified plaque at the bifurcation and proximal ICA without measurable stenosis. No hemodynamically significant stenosis or evidence of dissection. Vertebral arteries: Patent and codominant. Partially obscured proximally by artifact. No significant stenosis or evidence of dissection. Skeleton: Unremarkable. Other neck: Enlarged, multinodular thyroid. Upper chest: No apical lung mass. Review of the MIP images confirms the above findings CTA HEAD FINDINGS Anterior circulation: Intracranial internal carotid arteries are patent with mild calcified plaque. Anterior and middle cerebral arteries are patent. No significant stenosis. Posterior circulation: Intracranial vertebral arteries, basilar artery, and posterior cerebral arteries are patent. There are bilateral posterior communicating arteries. No significant stenosis. Venous sinuses: As permitted by contrast timing, patent. Anatomic variants: Congenitally absent right A1 ACA. Review of the MIP images confirms the above findings IMPRESSION: No large vessel occlusion or hemodynamically significant stenosis. Electronically Signed   By: Macy Mis M.D.   On: 05/20/2019 08:06   MR BRAIN WO CONTRAST  Result Date: 05/20/2019 CLINICAL DATA:  Initial evaluation for acute weakness, left upper extremity numbness. EXAM: MRI HEAD WITHOUT CONTRAST TECHNIQUE: Multiplanar, multiecho pulse sequences of the brain and surrounding structures were obtained without intravenous contrast. COMPARISON:  Comparison made with  prior CT from 05/19/2019. FINDINGS:  Brain: Age-related cerebral volume loss. Patchy T2/FLAIR hyperintensity seen within the periventricular and deep white matter both cerebral hemispheres, most consistent with chronic small vessel ischemic disease, moderate nature. Chronic microvascular ischemic changes present within the pons. Small remote left thalamic lacunar infarct noted. Patchy small volume cortical infarct measuring approximately 12 mm seen involving the posterior left frontal lobe, precentral gyrus (series 9, image 92). No associated hemorrhage or mass effect. No other evidence for acute or subacute ischemia. Gray-white matter differentiation otherwise maintained. No encephalomalacia to suggest chronic cortical infarction. No evidence for acute or chronic intracranial hemorrhage. No mass lesion, midline shift or mass effect. No hydrocephalus. No extra-axial fluid collection. Incidental note made of a partially empty sella. Midline structures intact. Vascular: Major intracranial vascular flow voids are maintained. Skull and upper cervical spine: Craniocervical junction within normal limits. Upper cervical spine normal. Bone marrow signal intensity within normal limits. No scalp soft tissue abnormality. Sinuses/Orbits: Globes and orbital soft tissues within normal limits. Mild scattered mucosal thickening noted throughout the paranasal sinuses. Paranasal sinuses are otherwise clear. No significant mastoid effusion. Inner ear structures normal. Other: None. IMPRESSION: 1. 12 mm acute ischemic nonhemorrhagic posterior left frontal cortical infarct involving the precentral gyrus. No associated hemorrhage or mass effect. 2. Moderate chronic microvascular ischemic disease. 3. Small remote left thalamic lacunar infarct. Electronically Signed   By: Jeannine Boga M.D.   On: 05/20/2019 03:00   ECHOCARDIOGRAM COMPLETE  Result Date: 05/20/2019   ECHOCARDIOGRAM REPORT   Patient Name:   Richard [name] Date of  Exam: 05/20/2019 Medical Rec #:  [id number]     Height:       71.0 in Accession #:    [id number]    Weight:       267.2 lb Date of Birth:  1970/07/22     BSA:          2.38 m Patient Age:    48 years      BP:           193/122 mmHg Patient Gender: M             HR:           83 bpm. Exam Location:  Inpatient Procedure: 2D Echo Indications:    Stroke I163.9  History:        Patient has no prior history of Echocardiogram examinations,                 most recent 03/15/2016. CHF; Risk Factors:Hypertension and                 Diabetes.  Sonographer:    Mikki Santee RDCS (AE) Referring Phys: [id number] RONDELL A SMITH IMPRESSIONS  1. Left ventricular ejection fraction, by visual estimation, is 50 to 55%. The left ventricle has low normal function. There is moderately increased left ventricular hypertrophy.  2. Indeterminate diastolic filling due to E-A fusion.  3. The left ventricle has no regional wall motion abnormalities.  4. Global right ventricle has low normal systolic function.The right ventricular size is normal. No increase in right ventricular wall thickness.  5. Left atrial size was mildly dilated.  6. Right atrial size was mildly dilated.  7. Trivial pericardial effusion is present.  8. The mitral valve is grossly normal. Mild mitral valve regurgitation.  9. The tricuspid valve is grossly normal. Tricuspid valve regurgitation is mild. 10. The aortic valve is tricuspid. Aortic valve regurgitation is not visualized. Mild aortic valve sclerosis without stenosis. 11. The pulmonic  valve was grossly normal. Pulmonic valve regurgitation is not visualized. 12. Mildly elevated pulmonary artery systolic pressure. 13. The tricuspid regurgitant velocity is 3.06 m/s, and with an assumed right atrial pressure of 3 mmHg, the estimated right ventricular systolic pressure is mildly elevated at 40.5 mmHg. 14. The inferior vena cava is normal in size with greater than 50% respiratory variability, suggesting right atrial  pressure of 3 mmHg. 15. A prior study was performed on 03/15/2016. 16. No significant change from prior study. FINDINGS  Left Ventricle: Left ventricular ejection fraction, by visual estimation, is 50 to 55%. The left ventricle has low normal function. The left ventricle has no regional wall motion abnormalities. The left ventricular internal cavity size was the left ventricle is normal in size. There is moderately increased left ventricular hypertrophy. Indeterminate diastolic filling due to E-A fusion. Right Ventricle: The right ventricular size is normal. No increase in right ventricular wall thickness. Global RV systolic function is has low normal systolic function. The tricuspid regurgitant velocity is 3.06 m/s, and with an assumed right atrial pressure of 3 mmHg, the estimated right ventricular systolic pressure is mildly elevated at 40.5 mmHg. Left Atrium: Left atrial size was mildly dilated. Right Atrium: Right atrial size was mildly dilated Pericardium: Trivial pericardial effusion is present. Mitral Valve: The mitral valve is grossly normal. Mild mitral valve regurgitation. Tricuspid Valve: The tricuspid valve is grossly normal. Tricuspid valve regurgitation is mild. Aortic Valve: The aortic valve is tricuspid. . There is mild thickening and mild calcification of the aortic valve. Aortic valve regurgitation is not visualized. Mild aortic valve sclerosis is present, with no evidence of aortic valve stenosis. There is mild thickening of the aortic valve. There is mild calcification of the aortic valve. Pulmonic Valve: The pulmonic valve was grossly normal. Pulmonic valve regurgitation is not visualized. Pulmonic regurgitation is not visualized. Aorta: The aortic root and ascending aorta are structurally normal, with no evidence of dilitation. Venous: The inferior vena cava is normal in size with greater than 50% respiratory variability, suggesting right atrial pressure of 3 mmHg. IAS/Shunts: No atrial level  shunt detected by color flow Doppler. Additional Comments: A prior study was performed on 03/15/2016.  LEFT VENTRICLE PLAX 2D LVIDd:         5.63 cm  Diastology LVIDs:         4.51 cm  LV e' lateral:   4.73 cm/s LV PW:         1.19 cm  LV E/e' lateral: 20.7 LV IVS:        1.16 cm  LV e' medial:    4.73 cm/s LVOT diam:     2.10 cm  LV E/e' medial:  20.7 LV SV:         63 ml LV SV Index:   24.95 LVOT Area:     3.46 cm  RIGHT VENTRICLE RV S prime:     9.38 cm/s TAPSE (M-mode): 1.4 cm LEFT ATRIUM              Index       RIGHT ATRIUM           Index LA diam:        4.10 cm  1.72 cm/m  RA Area:     18.50 cm LA Vol (A2C):   106.0 ml 44.45 ml/m RA Volume:   48.70 ml  20.42 ml/m LA Vol (A4C):   82.9 ml  34.76 ml/m LA Biplane Vol: 97.3 ml  40.80 ml/m  AORTIC VALVE LVOT Vmax:   67.20 cm/s LVOT Vmean:  47.900 cm/s LVOT VTI:    0.127 m  AORTA Ao Root diam: 3.20 cm MITRAL VALVE                       TRICUSPID VALVE MV Area (PHT): 3.72 cm            TR Peak grad:   37.5 mmHg MV PHT:        59.16 msec          TR Vmax:        369.00 cm/s MV Decel Time: 204 msec MV E velocity: 97.80 cm/s 103 cm/s SHUNTS                                    Systemic VTI:  0.13 m                                    Systemic Diam: 2.10 cm  Eleonore Chiquito MD Electronically signed by Eleonore Chiquito MD Signature Date/Time: 05/20/2019/3:13:43 PM    Final    VAS [place] LOWER EXTREMITY VENOUS (DVT)  Result Date: 05/20/2019  Lower Venous Study Indications: Stroke.  Comparison Study: 09/27/15 previous Performing Technologist: Abram Sander RVS  Examination Guidelines: A complete evaluation includes B-mode imaging, spectral Doppler, color Doppler, and power Doppler as needed of all accessible portions of each vessel. Bilateral testing is considered an integral part of a complete examination. Limited examinations for reoccurring indications may be performed as noted.  +---------+---------------+---------+-----------+----------+--------------+ RIGHT     CompressibilityPhasicitySpontaneityPropertiesThrombus Aging +---------+---------------+---------+-----------+----------+--------------+ CFV      Full           Yes      Yes                                 +---------+---------------+---------+-----------+----------+--------------+ SFJ      Full                                                        +---------+---------------+---------+-----------+----------+--------------+ FV Prox  Full                                                        +---------+---------------+---------+-----------+----------+--------------+ FV Mid   Full                                                        +---------+---------------+---------+-----------+----------+--------------+ FV DistalFull                                                        +---------+---------------+---------+-----------+----------+--------------+  PFV      Full                                                        +---------+---------------+---------+-----------+----------+--------------+ POP      Full           Yes      Yes                                 +---------+---------------+---------+-----------+----------+--------------+ PTV      Full                                                        +---------+---------------+---------+-----------+----------+--------------+ PERO                                                  Not visualized +---------+---------------+---------+-----------+----------+--------------+   +---------+---------------+---------+-----------+----------+--------------+ LEFT     CompressibilityPhasicitySpontaneityPropertiesThrombus Aging +---------+---------------+---------+-----------+----------+--------------+ CFV      Full           Yes      Yes                                 +---------+---------------+---------+-----------+----------+--------------+ SFJ      Full                                                         +---------+---------------+---------+-----------+----------+--------------+ FV Prox  Full                                                        +---------+---------------+---------+-----------+----------+--------------+ FV Mid   Full                                                        +---------+---------------+---------+-----------+----------+--------------+ FV DistalFull                                                        +---------+---------------+---------+-----------+----------+--------------+ PFV      Full                                                        +---------+---------------+---------+-----------+----------+--------------+  POP      Full           Yes      Yes                                 +---------+---------------+---------+-----------+----------+--------------+ PTV      Full                                                        +---------+---------------+---------+-----------+----------+--------------+ PERO                                                  Not visualized +---------+---------------+---------+-----------+----------+--------------+     Summary: Right: There is no evidence of deep vein thrombosis in the lower extremity. No cystic structure found in the popliteal fossa. Left: There is no evidence of deep vein thrombosis in the lower extremity. No cystic structure found in the popliteal fossa.  *See table(s) above for measurements and observations. Electronically signed by Monica Martinez MD on 05/20/2019 at 12:55:14 PM.    Final     PHYSICAL EXAM   Temp:  [97.8 F (36.6 C)-98.6 F (37 C)] 98.2 F (36.8 C) (12/17 1140) Pulse Rate:  [72-78] 78 (12/17 1140) Resp:  [15-24] 24 (12/17 1140) BP: (151-178)/(101-128) 165/111 (12/17 1140) SpO2:  [92 %-100 %] 99 % (12/17 1140)  General - obese, well developed, in no apparent distress.  Ophthalmologic - fundi not visualized due to  noncooperation.  Cardiovascular - Regular rhythm and rate.  Mental Status -  Level of arousal and orientation to time, place, and person were intact. Language including expression, naming, repetition, comprehension was assessed and found intact. Attention span and concentration were normal. Fund of Knowledge was assessed and was intact.  Cranial Nerves II - XII - II - Visual field intact OU. III, IV, VI - Extraocular movements intact. V - Facial sensation intact bilaterally. VII - Facial movement intact bilaterally. VIII - Hearing & vestibular intact bilaterally. X - Palate elevates symmetrically. XI - Chin turning & shoulder shrug intact bilaterally. XII - Tongue protrusion intact.  Motor Strength - The patient's strength was normal in all extremities and pronator drift was absent.  Bulk was normal and fasciculations were absent.   Motor Tone - Muscle tone was assessed at the neck and appendages and was normal.  Reflexes - The patient's reflexes were symmetrical in all extremities and he had no pathological reflexes.  Sensory - Light touch, temperature/pinprick were assessed and were symmetrical.    Coordination - The patient had normal movements in the hands and feet with no ataxia or dysmetria.  Tremor was absent.  Gait and Station - deferred.   ASSESSMENT/PLAN [name] is a 48 y.o. male with history of systolic heart failure, CKD, diabetes, hypertension with a history of hypertensive emergency documented in the chart, obesity, who has not been taking his medications in an attempt to get rid of medications by more holistic and natural means who woke from a nap with R arm weakness, finally presenting to ED with it resolving.   Stroke:  L frontal lobe infarct embolic secondary to unclear  source  CT head L pontomedullary jxn hypoattenuation, ? Infarct. No other acute abnormality. ICA and VA atherosclerosis  MRI  Posterior L frontal lobe cortical infarct (precentral  gyrus). Moderate small vessel disease. Small old L thalamic lacune.   CTA head & neck no LVO or significant stenosis   2D Echo EF 50-55%. No source of embolus   Hypercoagulable labs pending  LE dopplers neg DVT  Recommend TEE to look for embolic source. Arranged with [facility] for Friday at 1330. Pt made NPO after MN.  If TEE negative, a [facility] electrophysiologist will consult and consider placement of an implantable loop recorder to evaluate for atrial fibrillation as etiology of stroke. This has been explained to patient/family by Dr. Erlinda Hong and they are agreeable.   LDL 196  HgbA1c 5.8  Lovenox 40 mg sq daily for VTE prophylaxis  No antithrombotic prior to admission, now on aspirin 325 mg daily.  Recommend aspirin 81 and Plavix 75 DAPT for 3 weeks and then aspirin alone.  Therapy recommendations:  No therapy needs  Disposition:  Return home  Hypertensive Urgency . BP 200s on presentation  . BP stable on the higher end . Gradually normalize in 3-5 days . Long-term BP goal normotensive  Hyperlipidemia  Home meds:  No statin  Now on lipitor 80  LDL 196, goal < 70  Continue statin at discharge  Pre-Diabetes  HgbA1c 5.8, goal < 7.0  CBGs  SSI  Continue PCP follow-up  History of cardiomyopathy  Hx chronic combined Congestive heart failure  EF 20-25% in April 2017, improved to 50-555% Oct 2017.   Not compliant with Entresto  TTE EF 50-55%  TEE pending  Other Stroke Risk Factors  Obesity, recommend weight loss, diet and exercise as appropriate   Other Active Problems  CKD stage IIIa, creatinine 1.54->1.69  Hypokalemia, potassium 3.4->3.0 - supplement  Hospital day # 1  Rosalin Hawking, MD PhD Stroke Neurology 05/21/2019 1:55 PM    To contact Stroke Continuity provider, please refer to [email or web address] After hours, contact General Neurology

## 2019-05-21 NOTE — Consult Note (Addendum)
ELECTROPHYSIOLOGY CONSULT NOTE  Patient ID: Richard [name] MRN: [id number], DOB/AGE: 10-29-1970   Admit date: 05/19/2019 Date of Consult: 05/22/2019  Primary Physician: Patient, No Pcp Per Primary Cardiologist: No primary care provider on file.  Primary Electrophysiologist: New to Dr. Curt Bears Reason for Consultation: Cryptogenic stroke; recommendations regarding Implantable Loop Recorder  History of Present Illness EP has been asked to evaluate [place] for placement of an implantable loop recorder to monitor for atrial fibrillation by Dr Erlinda Hong.  The patient was admitted on 05/19/2019 with R arm weakness in the setting of medical non-compliance.  They first developed symptoms while  at home, on waking from a nap.    Imaging demonstrated L frontal lobe infarct suspected embolic. They have undergone workup for stroke including echocardiogram and CTA Head and Neck which showed no hemodynamically significant stenosis. The patient has been monitored on telemetry which has demonstrated sinus rhythm with no arrhythmias.  Inpatient stroke work-up will require a TEE per Neurology.   Echocardiogram this admission demonstrated EF 50-55%.  Lab work is reviewed.  Prior to admission, the patient denies chest pain, shortness of breath, dizziness, palpitations, or syncope.  They are recovering from their stroke with plans to return home  at discharge.  Past Medical History:  Diagnosis Date   Acute decompensated heart failure ([place]) 01/03/2013   Allergy    CHF (congestive heart failure) ([facility])    systolic   Chronic kidney disease    CKD (chronic kidney disease) stage 2, GFR 60-89 ml/min 01/04/2013   DM (diabetes mellitus) ([place]) 01/06/2013   Hypertension    hx htn emergency   Obesity      Surgical History:  Past Surgical History:  Procedure Laterality Date   LEFT AND RIGHT HEART CATHETERIZATION WITH CORONARY ANGIOGRAM N/A 01/07/2013   Procedure: LEFT AND RIGHT HEART CATHETERIZATION WITH CORONARY  ANGIOGRAM;  Surgeon: Birdie Riddle, MD;  Location: [facility];  Service: Cardiovascular;  Laterality: N/A;     Medications Prior to Admission  Medication Sig Dispense Refill Last Dose   cholecalciferol (VITAMIN D3) 25 MCG (1000 UT) tablet Take 1,000 Units by mouth daily.   05/19/2019 at Unknown time   Multiple Vitamin (MULTIVITAMIN WITH MINERALS) TABS tablet Take 1 tablet by mouth daily.   05/19/2019 at Unknown time   Omega-3 Fatty Acids (FISH OIL) 1000 MG CAPS Take 1 capsule by mouth daily.   05/19/2019 at Unknown time   Turmeric (QC TUMERIC COMPLEX) 500 MG CAPS Take 500 mg by mouth daily.   05/19/2019 at Unknown time   Blood Glucose Monitoring Suppl (ACURA BLOOD GLUCOSE METER) W/DEVICE KIT 1 meter with lancets and test strips for daily BS testing QS 1 month (Patient not taking: Reported on 05/20/2019) 1 kit 0 Not Taking at Unknown time   carvedilol (COREG) 25 MG tablet Take 1 tablet (25 mg total) by mouth 2 (two) times daily with a meal. (Patient not taking: Reported on 05/20/2019) 60 tablet 6 Not Taking at Unknown time   furosemide (LASIX) 40 MG tablet Take 1 tablet (40 mg total) by mouth daily. (Patient not taking: Reported on 05/20/2019) 30 tablet 6 Not Taking at Unknown time   furosemide (LASIX) 40 MG tablet TAKE ONE TABLET BY MOUTH TWICE DAILY (Patient not taking: Reported on 05/20/2019) 60 tablet 1 Not Taking at Unknown time   glipiZIDE (GLUCOTROL) 5 MG tablet TAKE ONE TABLET BY MOUTH TWICE DAILY BEFORE MEAL(S) (Patient not taking: Reported on 05/20/2019) 180 tablet 3 Not Taking at Unknown  time   glucose blood (ONE TOUCH ULTRA TEST) test strip Use as directed to check blood sugar once a day. 100 each 3 unk   isosorbide-hydrALAZINE (BIDIL) 20-37.5 MG tablet Take 2 tablets by mouth 3 (three) times daily. (Patient not taking: Reported on 05/20/2019) 180 tablet 11 Not Taking at Unknown time   metFORMIN (GLUCOPHAGE) 500 MG tablet Take 1 tablet (500 mg total) 2 (two) times daily with a meal by  mouth. (Patient not taking: Reported on 05/20/2019) 180 tablet 3 Not Taking at Unknown time   [facility] DELICA LANCETS 76E MISC 1 Device by Other route daily. Use as directed for glucose testing once a day. 100 each 3    potassium chloride SA (K-DUR,KLOR-CON) 20 MEQ tablet Take 2 tablets (40 mEq total) by mouth daily. (Patient not taking: Reported on 05/20/2019) 60 tablet 6 Not Taking at Unknown time   sacubitril-valsartan (ENTRESTO) 97-103 MG Take 1 tablet by mouth 2 (two) times daily. (Patient not taking: Reported on 05/20/2019) 60 tablet 11 Not Taking at Unknown time   spironolactone (ALDACTONE) 25 MG tablet Take 1 tablet (25 mg total) by mouth daily. (Patient not taking: Reported on 05/20/2019) 30 tablet 6 Not Taking at Unknown time    Inpatient Medications:   aspirin EC  81 mg Oral Daily   atorvastatin  80 mg Oral q1800   clopidogrel  75 mg Oral Daily   enoxaparin (LOVENOX) injection  40 mg Subcutaneous Q24H   potassium chloride  40 mEq Oral BID    Allergies: No Known Allergies  Social History   Socioeconomic History   Marital status: Single    Spouse name: Not on file   Number of children: Not on file   Years of education: Not on file   Highest education level: Not on file  Occupational History   Not on file  Tobacco Use   Smoking status: Never Smoker   Smokeless tobacco: Never Used  Substance and Sexual Activity   Alcohol use: No   Drug use: No   Sexual activity: Not on file  Other Topics Concern   Not on file  Social History Narrative   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, homeless Chief of Staff      Home Situation: lives alone      Spiritual Beliefs: Christian      Lifestyle: 15 minutes dialy walking - working up; working on diet            Social Determinants of Radio broadcast assistant Strain:    Difficulty of Paying Living Expenses: Not on file  Food Insecurity:    Worried About Charity fundraiser in the Last Year: Not on file   [employer] of  Food in the Last Year: Not on file  Transportation Needs:    Lack of Transportation (Medical): Not on file   Lack of Transportation (Non-Medical): Not on file  Physical Activity:    Days of Exercise per Week: Not on file   Minutes of Exercise per Session: Not on file  Stress:    Feeling of Stress : Not on file  Social Connections:    Frequency of Communication with Friends and Family: Not on file   Frequency of Social Gatherings with Friends and Family: Not on file   Attends Religious Services: Not on file   Active Member of Clubs or Organizations: Not on file   Attends Archivist Meetings: Not on file   Marital Status: Not on file  Intimate Partner Violence:    Fear of Current or Ex-Partner: Not on file   Emotionally Abused: Not on file   Physically Abused: Not on file   Sexually Abused: Not on file     Family History  Problem Relation Age of Onset   Heart disease Father 4       MI   Hypertension Father    Hyperlipidemia Father    Diabetes Mother    Hypertension Mother       Review of Systems: All other systems reviewed and are otherwise negative except as noted above.  Physical Exam: Vitals:   05/21/19 0441 05/21/19 0947 05/21/19 1109 05/21/19 1140  BP: (!) 155/104 (!) 178/128 (!) 171/117 (!) 165/111  Pulse: 75 77 75 78  Resp: 16 18  (!) 24  Temp: 97.8 F (36.6 C) 98.4 F (36.9 C)  98.2 F (36.8 C)  TempSrc: Oral Oral  Oral  SpO2: 100% 100%  99%    GEN- The patient is well appearing, alert and oriented x 3 today.   Head- normocephalic, atraumatic Eyes-  Sclera clear, conjunctiva pink Ears- hearing intact Oropharynx- clear Neck- supple Lungs- Clear to ausculation bilaterally, normal work of breathing Heart- Regular rate and rhythm, no murmurs, rubs or gallops  GI- soft, NT, ND, + BS Extremities- no clubbing, cyanosis, or edema MS- no significant deformity or atrophy Skin- no rash or lesion Psych- euthymic mood, full affect   Labs:   Lab  Results  Component Value Date   WBC 6.2 05/19/2019   HGB 16.2 05/19/2019   HCT 49.5 05/19/2019   MCV 87.3 05/19/2019   PLT 219 05/19/2019    Recent Labs  Lab 05/19/19 2104 05/21/19 0227  NA 141 143  K 3.4* 3.0*  CL 104 106  CO2 26 24  BUN 12 15  CREATININE 1.54* 1.69*  CALCIUM 9.2 8.5*  PROT 7.1  --   BILITOT 0.8  --   ALKPHOS 44  --   ALT 21  --   AST 25  --   GLUCOSE 111* 135*     Radiology/Studies: CT ANGIO HEAD W OR WO CONTRAST  Result Date: 05/20/2019 CLINICAL DATA:  Right arm weakness and numbness, now resolved EXAM: CT ANGIOGRAPHY HEAD AND NECK TECHNIQUE: Multidetector CT imaging of the head and neck was performed using the standard protocol during bolus administration of intravenous contrast. Multiplanar CT image reconstructions and MIPs were obtained to evaluate the vascular anatomy. Carotid stenosis measurements (when applicable) are obtained utilizing NASCET criteria, using the distal internal carotid diameter as the denominator. CONTRAST:  40m OMNIPAQUE IOHEXOL 350 MG/ML SOLN COMPARISON:  None. FINDINGS: CTA NECK FINDINGS Aortic arch: Great vessel origins are patent. Right carotid system: Common, internal, and external carotid arteries are patent. There is minimal calcified plaque at the bifurcation. No hemodynamically significant stenosis or evidence of dissection. Left carotid system: Common, internal, and external carotid arteries are patent. There is mild eccentric noncalcified plaque at the bifurcation and proximal ICA without measurable stenosis. No hemodynamically significant stenosis or evidence of dissection. Vertebral arteries: Patent and codominant. Partially obscured proximally by artifact. No significant stenosis or evidence of dissection. Skeleton: Unremarkable. Other neck: Enlarged, multinodular thyroid. Upper chest: No apical lung mass. Review of the MIP images confirms the above findings CTA HEAD FINDINGS Anterior circulation: Intracranial internal carotid  arteries are patent with mild calcified plaque. Anterior and middle cerebral arteries are patent. No significant stenosis. Posterior circulation: Intracranial vertebral arteries, basilar artery, and posterior cerebral arteries are  patent. There are bilateral posterior communicating arteries. No significant stenosis. Venous sinuses: As permitted by contrast timing, patent. Anatomic variants: Congenitally absent right A1 ACA. Review of the MIP images confirms the above findings IMPRESSION: No large vessel occlusion or hemodynamically significant stenosis. Electronically Signed   By: Macy Mis M.D.   On: 05/20/2019 08:06   DG Chest 2 View  Result Date: 05/20/2019 CLINICAL DATA:  Left arm numbness and weakness. EXAM: CHEST - 2 VIEW COMPARISON:  September 26, 2015 FINDINGS: The heart size is borderline to mildly enlarged. The hila and mediastinum are normal. No pneumothorax. No nodules or masses. No focal infiltrates. No overt edema. IMPRESSION: No active cardiopulmonary disease. Electronically Signed   By: Dorise Bullion III M.D   On: 05/20/2019 08:50   CT HEAD WO CONTRAST  Result Date: 05/19/2019 CLINICAL DATA:  Woke from sleep with left arm weakness, possibly slept on arm. EXAM: CT HEAD WITHOUT CONTRAST TECHNIQUE: Contiguous axial images were obtained from the base of the skull through the vertex without intravenous contrast. COMPARISON:  None. FINDINGS: Brain: Question a subtle 9 mm hyperattenuating structure seen in the left anterolateral pontomedullary junction (2/9). No adjacent edema. No associated mass effect. No convincing CT evidence of acute infarction, hemorrhage, hydrocephalus, or extra-axial collection. Vascular: Atherosclerotic calcification of the carotid siphons and intradural vertebral arteries. No hyperdense vessel. Skull: No calvarial fracture or suspicious osseous lesion. No scalp swelling or hematoma. Sinuses/Orbits: Paranasal sinuses and mastoid air cells are predominantly clear.  Included orbital structures are unremarkable. Other: None IMPRESSION: 1. Question a subtle 9 mm hyperattenuating structure in the left anterolateral pontomedullary junction. No adjacent edema. No associated mass effect. This may represent a small cavernous malformation, however, given the patient's symptoms, recommend MRI with and without contrast for further characterization. 2. No other acute intracranial abnormality. 3. Atherosclerosis of the carotid siphons and intradural vertebral arteries. Electronically Signed   By: Lovena Le M.D.   On: 05/19/2019 21:43   CT ANGIO NECK W OR WO CONTRAST  Result Date: 05/20/2019 CLINICAL DATA:  Right arm weakness and numbness, now resolved EXAM: CT ANGIOGRAPHY HEAD AND NECK TECHNIQUE: Multidetector CT imaging of the head and neck was performed using the standard protocol during bolus administration of intravenous contrast. Multiplanar CT image reconstructions and MIPs were obtained to evaluate the vascular anatomy. Carotid stenosis measurements (when applicable) are obtained utilizing NASCET criteria, using the distal internal carotid diameter as the denominator. CONTRAST:  3m OMNIPAQUE IOHEXOL 350 MG/ML SOLN COMPARISON:  None. FINDINGS: CTA NECK FINDINGS Aortic arch: Great vessel origins are patent. Right carotid system: Common, internal, and external carotid arteries are patent. There is minimal calcified plaque at the bifurcation. No hemodynamically significant stenosis or evidence of dissection. Left carotid system: Common, internal, and external carotid arteries are patent. There is mild eccentric noncalcified plaque at the bifurcation and proximal ICA without measurable stenosis. No hemodynamically significant stenosis or evidence of dissection. Vertebral arteries: Patent and codominant. Partially obscured proximally by artifact. No significant stenosis or evidence of dissection. Skeleton: Unremarkable. Other neck: Enlarged, multinodular thyroid. Upper chest: No  apical lung mass. Review of the MIP images confirms the above findings CTA HEAD FINDINGS Anterior circulation: Intracranial internal carotid arteries are patent with mild calcified plaque. Anterior and middle cerebral arteries are patent. No significant stenosis. Posterior circulation: Intracranial vertebral arteries, basilar artery, and posterior cerebral arteries are patent. There are bilateral posterior communicating arteries. No significant stenosis. Venous sinuses: As permitted by contrast timing, patent. Anatomic variants: Congenitally absent  right A1 ACA. Review of the MIP images confirms the above findings IMPRESSION: No large vessel occlusion or hemodynamically significant stenosis. Electronically Signed   By: Macy Mis M.D.   On: 05/20/2019 08:06   MR BRAIN WO CONTRAST  Result Date: 05/20/2019 CLINICAL DATA:  Initial evaluation for acute weakness, left upper extremity numbness. EXAM: MRI HEAD WITHOUT CONTRAST TECHNIQUE: Multiplanar, multiecho pulse sequences of the brain and surrounding structures were obtained without intravenous contrast. COMPARISON:  Comparison made with prior CT from 05/19/2019. FINDINGS: Brain: Age-related cerebral volume loss. Patchy T2/FLAIR hyperintensity seen within the periventricular and deep white matter both cerebral hemispheres, most consistent with chronic small vessel ischemic disease, moderate nature. Chronic microvascular ischemic changes present within the pons. Small remote left thalamic lacunar infarct noted. Patchy small volume cortical infarct measuring approximately 12 mm seen involving the posterior left frontal lobe, precentral gyrus (series 9, image 92). No associated hemorrhage or mass effect. No other evidence for acute or subacute ischemia. Gray-white matter differentiation otherwise maintained. No encephalomalacia to suggest chronic cortical infarction. No evidence for acute or chronic intracranial hemorrhage. No mass lesion, midline shift or mass  effect. No hydrocephalus. No extra-axial fluid collection. Incidental note made of a partially empty sella. Midline structures intact. Vascular: Major intracranial vascular flow voids are maintained. Skull and upper cervical spine: Craniocervical junction within normal limits. Upper cervical spine normal. Bone marrow signal intensity within normal limits. No scalp soft tissue abnormality. Sinuses/Orbits: Globes and orbital soft tissues within normal limits. Mild scattered mucosal thickening noted throughout the paranasal sinuses. Paranasal sinuses are otherwise clear. No significant mastoid effusion. Inner ear structures normal. Other: None. IMPRESSION: 1. 12 mm acute ischemic nonhemorrhagic posterior left frontal cortical infarct involving the precentral gyrus. No associated hemorrhage or mass effect. 2. Moderate chronic microvascular ischemic disease. 3. Small remote left thalamic lacunar infarct. Electronically Signed   By: Jeannine Boga M.D.   On: 05/20/2019 03:00   ECHOCARDIOGRAM COMPLETE  Result Date: 05/20/2019   ECHOCARDIOGRAM REPORT   Patient Name:   BLAS RICHES Date of Exam: 05/20/2019 Medical Rec #:  [id number]     Height:       71.0 in Accession #:    [id number]    Weight:       267.2 lb Date of Birth:  Sep 11, 1970     BSA:          2.38 m Patient Age:    23 years      BP:           193/122 mmHg Patient Gender: M             HR:           83 bpm. Exam Location:  Inpatient Procedure: 2D Echo Indications:    Stroke I163.9  History:        Patient has no prior history of Echocardiogram examinations,                 most recent 03/15/2016. CHF; Risk Factors:Hypertension and                 Diabetes.  Sonographer:    Mikki Santee RDCS (AE) Referring Phys: [id number] RONDELL A SMITH IMPRESSIONS  1. Left ventricular ejection fraction, by visual estimation, is 50 to 55%. The left ventricle has low normal function. There is moderately increased left ventricular hypertrophy.  2. Indeterminate  diastolic filling due to E-A fusion.  3. The left ventricle has no regional wall motion abnormalities.  4.  Global right ventricle has low normal systolic function.The right ventricular size is normal. No increase in right ventricular wall thickness.  5. Left atrial size was mildly dilated.  6. Right atrial size was mildly dilated.  7. Trivial pericardial effusion is present.  8. The mitral valve is grossly normal. Mild mitral valve regurgitation.  9. The tricuspid valve is grossly normal. Tricuspid valve regurgitation is mild. 10. The aortic valve is tricuspid. Aortic valve regurgitation is not visualized. Mild aortic valve sclerosis without stenosis. 11. The pulmonic valve was grossly normal. Pulmonic valve regurgitation is not visualized. 12. Mildly elevated pulmonary artery systolic pressure. 13. The tricuspid regurgitant velocity is 3.06 m/s, and with an assumed right atrial pressure of 3 mmHg, the estimated right ventricular systolic pressure is mildly elevated at 40.5 mmHg. 14. The inferior vena cava is normal in size with greater than 50% respiratory variability, suggesting right atrial pressure of 3 mmHg. 15. A prior study was performed on 03/15/2016. 16. No significant change from prior study. FINDINGS  Left Ventricle: Left ventricular ejection fraction, by visual estimation, is 50 to 55%. The left ventricle has low normal function. The left ventricle has no regional wall motion abnormalities. The left ventricular internal cavity size was the left ventricle is normal in size. There is moderately increased left ventricular hypertrophy. Indeterminate diastolic filling due to E-A fusion. Right Ventricle: The right ventricular size is normal. No increase in right ventricular wall thickness. Global RV systolic function is has low normal systolic function. The tricuspid regurgitant velocity is 3.06 m/s, and with an assumed right atrial pressure of 3 mmHg, the estimated right ventricular systolic pressure is  mildly elevated at 40.5 mmHg. Left Atrium: Left atrial size was mildly dilated. Right Atrium: Right atrial size was mildly dilated Pericardium: Trivial pericardial effusion is present. Mitral Valve: The mitral valve is grossly normal. Mild mitral valve regurgitation. Tricuspid Valve: The tricuspid valve is grossly normal. Tricuspid valve regurgitation is mild. Aortic Valve: The aortic valve is tricuspid. . There is mild thickening and mild calcification of the aortic valve. Aortic valve regurgitation is not visualized. Mild aortic valve sclerosis is present, with no evidence of aortic valve stenosis. There is mild thickening of the aortic valve. There is mild calcification of the aortic valve. Pulmonic Valve: The pulmonic valve was grossly normal. Pulmonic valve regurgitation is not visualized. Pulmonic regurgitation is not visualized. Aorta: The aortic root and ascending aorta are structurally normal, with no evidence of dilitation. Venous: The inferior vena cava is normal in size with greater than 50% respiratory variability, suggesting right atrial pressure of 3 mmHg. IAS/Shunts: No atrial level shunt detected by color flow Doppler. Additional Comments: A prior study was performed on 03/15/2016.  LEFT VENTRICLE PLAX 2D LVIDd:         5.63 cm  Diastology LVIDs:         4.51 cm  LV e' lateral:   4.73 cm/s LV PW:         1.19 cm  LV E/e' lateral: 20.7 LV IVS:        1.16 cm  LV e' medial:    4.73 cm/s LVOT diam:     2.10 cm  LV E/e' medial:  20.7 LV SV:         63 ml LV SV Index:   24.95 LVOT Area:     3.46 cm  RIGHT VENTRICLE RV S prime:     9.38 cm/s TAPSE (M-mode): 1.4 cm LEFT ATRIUM  Index       RIGHT ATRIUM           Index LA diam:        4.10 cm  1.72 cm/m  RA Area:     18.50 cm LA Vol (A2C):   106.0 ml 44.45 ml/m RA Volume:   48.70 ml  20.42 ml/m LA Vol (A4C):   82.9 ml  34.76 ml/m LA Biplane Vol: 97.3 ml  40.80 ml/m  AORTIC VALVE LVOT Vmax:   67.20 cm/s LVOT Vmean:  47.900 cm/s LVOT VTI:     0.127 m  AORTA Ao Root diam: 3.20 cm MITRAL VALVE                       TRICUSPID VALVE MV Area (PHT): 3.72 cm            TR Peak grad:   37.5 mmHg MV PHT:        59.16 msec          TR Vmax:        369.00 cm/s MV Decel Time: 204 msec MV E velocity: 97.80 cm/s 103 cm/s SHUNTS                                    Systemic VTI:  0.13 m                                    Systemic Diam: 2.10 cm  Eleonore Chiquito MD Electronically signed by Eleonore Chiquito MD Signature Date/Time: 05/20/2019/3:13:43 PM    Final    VAS [place] LOWER EXTREMITY VENOUS (DVT)  Result Date: 05/20/2019  Lower Venous Study Indications: Stroke.  Comparison Study: 09/27/15 previous Performing Technologist: Abram Sander RVS  Examination Guidelines: A complete evaluation includes B-mode imaging, spectral Doppler, color Doppler, and power Doppler as needed of all accessible portions of each vessel. Bilateral testing is considered an integral part of a complete examination. Limited examinations for reoccurring indications may be performed as noted.  +---------+---------------+---------+-----------+----------+--------------+ RIGHT    CompressibilityPhasicitySpontaneityPropertiesThrombus Aging +---------+---------------+---------+-----------+----------+--------------+ CFV      Full           Yes      Yes                                 +---------+---------------+---------+-----------+----------+--------------+ SFJ      Full                                                        +---------+---------------+---------+-----------+----------+--------------+ FV Prox  Full                                                        +---------+---------------+---------+-----------+----------+--------------+ FV Mid   Full                                                        +---------+---------------+---------+-----------+----------+--------------+  FV DistalFull                                                         +---------+---------------+---------+-----------+----------+--------------+ PFV      Full                                                        +---------+---------------+---------+-----------+----------+--------------+ POP      Full           Yes      Yes                                 +---------+---------------+---------+-----------+----------+--------------+ PTV      Full                                                        +---------+---------------+---------+-----------+----------+--------------+ PERO                                                  Not visualized +---------+---------------+---------+-----------+----------+--------------+   +---------+---------------+---------+-----------+----------+--------------+ LEFT     CompressibilityPhasicitySpontaneityPropertiesThrombus Aging +---------+---------------+---------+-----------+----------+--------------+ CFV      Full           Yes      Yes                                 +---------+---------------+---------+-----------+----------+--------------+ SFJ      Full                                                        +---------+---------------+---------+-----------+----------+--------------+ FV Prox  Full                                                        +---------+---------------+---------+-----------+----------+--------------+ FV Mid   Full                                                        +---------+---------------+---------+-----------+----------+--------------+ FV DistalFull                                                        +---------+---------------+---------+-----------+----------+--------------+  PFV      Full                                                        +---------+---------------+---------+-----------+----------+--------------+ POP      Full           Yes      Yes                                  +---------+---------------+---------+-----------+----------+--------------+ PTV      Full                                                        +---------+---------------+---------+-----------+----------+--------------+ PERO                                                  Not visualized +---------+---------------+---------+-----------+----------+--------------+     Summary: Right: There is no evidence of deep vein thrombosis in the lower extremity. No cystic structure found in the popliteal fossa. Left: There is no evidence of deep vein thrombosis in the lower extremity. No cystic structure found in the popliteal fossa.  *See table(s) above for measurements and observations. Electronically signed by Monica Martinez MD on 05/20/2019 at 12:55:14 PM.    Final     12-lead ECG 05/20/2019 shows NSR 78 bpm, with PR interval 180 ms and QRS 89 ms (personally reviewed) All prior EKG's in EP[facility] reviewed with no documented atrial fibrillation  Telemetry NSR 70-80s (personally reviewed)  Assessment and Plan:  1. Cryptogenic stroke The patient presents with cryptogenic stroke.  The patient does have a TEE planned for this AM.  I spoke at length with the patient about monitoring for afib with an implantable loop recorder.  Risks, benefits, and alteratives to implantable loop recorder were discussed with the patient today.   At this time,  would like to consider a loop recorder, and would like [place] to come back later this morning to discuss further.     Shirley Friar, PA-C 05/21/2019 3:09 PM  I have seen and examined this patient with Oda Kilts.  Agree with above, note added to reflect my findings.  On exam, RRR, no murmurs, lungs clear. Patient admitted for cryptogenic stroke. No cause found yet. Plan for TEE later today. Discussed LINQ monitoring. Patient states that he does not wish to be monitored with LINQ and will thus place an event monitor.    Will M. Camnitz  MD 05/22/2019 11:25 AM

## 2019-05-21 NOTE — Progress Notes (Signed)
OT Cancellation Note  Patient Details Name: Richard [name] MRN: [id number] DOB: [birth year]   Cancelled Treatment:    Reason Eval/Treat Not Completed: OT screened, no needs identified, will sign off.  Pt is back to baseline.  Nilsa Nutting., OTR/L Acute Rehabilitation Services Pager [phone] Office [phone]   Lucille Passy M 05/21/2019, 5:48 PM

## 2019-05-21 NOTE — Progress Notes (Signed)
PROGRESS NOTE    Richard [name]  [id number] DOB: 1970/09/16 DOA: 05/19/2019 PCP: Patient, No Pcp Per     Brief Narrative:  Richard [name] is a 48 y.o. right-handed male with medical history significant of hypertension, chronic systolic congestive heart failure, prediabetes, chronic kidney disease stage II, who presents with complaints of right arm numbness and weakness.  Patient reports waking up to go to work last night at around 6 PM and reporting that his hand felt as though it were asleep.  He had tried to pick up the help of coffee but was unable to.  Last week patient reported that he had been getting headaches, but related to him recently getting fitted for Invisalign.  He has not been on any medications in quite some time never followed up on previous.  Patient reported that he lost significant amount of weight, but regained 30pounds since the pandemic started.  Denies having any significant fever, chills, shortness of breath, cough, nausea, vomiting, or diarrhea.  Family history is significant for his dad passed away from a heart attack at the age of 110. Neurology evaluated the patient and initial CT scan of the brain revealed a 9 mm hyperattenuating structure in the left anterior lateral pontomedullary junction for which MRI was recommended.  MRI revealed 12 mm acute ischemic posterior left frontal cortical infarct.  Patient was not considered a candidate for TPA.  New events last 24 hours / Subjective: Right arm weakness and numbness has now resolved.  Feeling back to his baseline.  TEE scheduled for tomorrow.  Assessment & Plan:   Principal Problem:   Acute ischemic stroke [facility]) Active Problems:   CKD (chronic kidney disease) stage 2, GFR 60-89 ml/min   Hypokalemia   Obesity   Chronic combined systolic and diastolic CHF, NYHA class 2 (HCC)   Prediabetes   Hypertensive urgency   Acute left frontal CVA -Presented with right arm weakness and numbness, now resolved -MRI  revealed acute 12 mm ischemic posterior left frontal cortical infarct -CTA of the head neck did not show large vessel occlusion or significant stenosis -Doppler ultrasound negative for DVT bilaterally -Echocardiogram as below  -Hypercoagulable work up in process  -PT evaluated patient, no PT follow up recommended  -TEE pending scheduled for 12/18 to look for embolic source  -Aspirin 81mg , plavix 75mg  daily for 3 weeks, then aspirin alone. Lipitor  -Neurology following  Hypertensive urgency -Allow for permissive hypertension in setting of acute stroke  Chronic diastolic CHF -Echocardiogram as below, EF 50-55% -Without acute exacerbation of heart failure  Prediabetes -Hemoglobin A1c 5.8  Chronic kidney disease stage IIIa -Baseline creatinine 1.5 -Stable   Hypokalemia -Replace, trend  Obesity -Estimated body mass index is 37.27 kg/m as calculated from the following:   Height as of 04/12/17: 5\' 11"  (1.803 m).   Weight as of 04/30/17: 121.2 kg.   DVT prophylaxis: Lovenox  Code Status: Full Family Communication: None at baseline Disposition Plan: Pending TEE 12/18   Consultants:   Neurology  Procedures:   Echocardiogram IMPRESSIONS  1. Left ventricular ejection fraction, by visual estimation, is 50 to 55%. The left ventricle has low normal function. There is moderately increased left ventricular hypertrophy.  2. Indeterminate diastolic filling due to E-A fusion.  3. The left ventricle has no regional wall motion abnormalities.  4. Global right ventricle has low normal systolic function.The right ventricular size is normal. No increase in right ventricular wall thickness.  5. Left atrial size was mildly dilated.  6. Right  atrial size was mildly dilated.  7. Trivial pericardial effusion is present.  8. The mitral valve is grossly normal. Mild mitral valve regurgitation.  9. The tricuspid valve is grossly normal. Tricuspid valve regurgitation is mild. 10. The aortic  valve is tricuspid. Aortic valve regurgitation is not visualized. Mild aortic valve sclerosis without stenosis. 11. The pulmonic valve was grossly normal. Pulmonic valve regurgitation is not visualized. 12. Mildly elevated pulmonary artery systolic pressure. 13. The tricuspid regurgitant velocity is 3.06 m/s, and with an assumed right atrial pressure of 3 mmHg, the estimated right ventricular systolic pressure is mildly elevated at 40.5 mmHg. 14. The inferior vena cava is normal in size with greater than 50% respiratory variability, suggesting right atrial pressure of 3 mmHg. 15. A prior study was performed on 03/15/2016. 16. No significant change from prior study.   TEE 12/18   Antimicrobials:  Anti-infectives (From admission, onward)   None        Objective: Vitals:   05/20/19 1952 05/20/19 2347 05/21/19 0441 05/21/19 0947  BP: (!) 151/101 (!) 175/119 (!) 155/104 (!) 178/128  Pulse: 74 72 75 77  Resp: 15 16 16 18   Temp: 98.6 F (37 C) 98 F (36.7 C) 97.8 F (36.6 C) 98.4 F (36.9 C)  TempSrc: Oral Oral Oral Oral  SpO2: 100% 92% 100% 100%    Intake/Output Summary (Last 24 hours) at 05/21/2019 1051 Last data filed at 05/20/2019 1848 Gross per 24 hour  Intake 240 ml  Output --  Net 240 ml   There were no vitals filed for this visit.   Examination:  General exam: Appears calm and comfortable  Respiratory system: Clear to auscultation. Respiratory effort normal. No respiratory distress. No conversational dyspnea.  Cardiovascular system: S1 & S2 heard, RRR. No murmurs. No pedal edema. Gastrointestinal system: Abdomen is nondistended, soft and nontender. Normal bowel sounds heard. Central nervous system: Alert and oriented. No focal neurological deficits. Speech clear.  Extremities: Symmetric in appearance  Skin: No rashes, lesions or ulcers on exposed skin  Psychiatry: Judgement and insight appear normal. Mood & affect appropriate.   Data Reviewed: I have personally  reviewed following labs and imaging studies  CBC: Recent Labs  Lab 05/19/19 2104  WBC 6.2  NEUTROABS 3.7  HGB 16.2  HCT 49.5  MCV 87.3  PLT [id number]   Basic Metabolic Panel: Recent Labs  Lab 05/19/19 2104 05/21/19 0227  NA 141 143  K 3.4* 3.0*  CL 104 106  CO2 26 24  GLUCOSE 111* 135*  BUN 12 15  CREATININE 1.54* 1.69*  CALCIUM 9.2 8.5*  MG  --  2.0   GFR: CrCl cannot be calculated (Unknown ideal weight.). Liver Function Tests: Recent Labs  Lab 05/19/19 2104  AST 25  ALT 21  ALKPHOS 44  BILITOT 0.8  PROT 7.1  ALBUMIN 3.8   No results for input(s): LIPASE, AMYLASE in the last 168 hours. No results for input(s): AMMONIA in the last 168 hours. Coagulation Profile: Recent Labs  Lab 05/19/19 2104  INR 1.0   Cardiac Enzymes: No results for input(s): CKTOTAL, CKMB, CKMBINDEX, TROPONINI in the last 168 hours. BNP (last 3 results) No results for input(s): PROBNP in the last 8760 hours. HbA1C: Recent Labs    05/20/19 0940  HGBA1C 5.8*   CBG: No results for input(s): GLUCAP in the last 168 hours. Lipid Profile: Recent Labs    05/20/19 0800  CHOL 268*  HDL 51  LDLCALC 196*  TRIG 104  CHOLHDL 5.3  Thyroid Function Tests: Recent Labs    05/20/19 1213  TSH 0.884   Anemia Panel: Recent Labs    05/21/19 0227  VITAMINB12 257   Sepsis Labs: No results for input(s): PROCALCITON, LATICACIDVEN in the last 168 hours.  Recent Results (from the past 240 hour(s))  SARS CORONAVIRUS 2 (TAT 6-24 HRS) Nasopharyngeal Nasopharyngeal Swab     Status: None   Collection Time: 05/20/19  6:48 AM   Specimen: Nasopharyngeal Swab  Result Value Ref Range Status   SARS Coronavirus 2 NEGATIVE NEGATIVE Final    Comment: (NOTE) SARS-CoV-2 target nucleic acids are NOT DETECTED. The SARS-CoV-2 RNA is generally detectable in upper and lower respiratory specimens during the acute phase of infection. Negative results do not preclude SARS-CoV-2 infection, do not rule  out co-infections with other pathogens, and should not be used as the sole basis for treatment or other patient management decisions. Negative results must be combined with clinical observations, patient history, and epidemiological information. The expected result is Negative. Fact Sheet for Patients: SugarRoll.be Fact Sheet for Healthcare Providers: https://www.woods-mathews.com/ This test is not yet approved or cleared by the [place] FDA and  has been authorized for detection and/or diagnosis of SARS-CoV-2 by FDA under an Emergency Use Authorization (EUA). This EUA will remain  in effect (meaning this test can be used) for the duration of the COVID-19 declaration under Section 56 4(b)(1) of the Act, 21 U.S.C. section 360bbb-3(b)(1), unless the authorization is terminated or revoked sooner. Performed at [facility], [place] [address], [place], [place] [address]       Radiology Studies: CT ANGIO HEAD W OR WO CONTRAST  Result Date: 05/20/2019 CLINICAL DATA:  Right arm weakness and numbness, now resolved EXAM: CT ANGIOGRAPHY HEAD AND NECK TECHNIQUE: Multidetector CT imaging of the head and neck was performed using the standard protocol during bolus administration of intravenous contrast. Multiplanar CT image reconstructions and MIPs were obtained to evaluate the vascular anatomy. Carotid stenosis measurements (when applicable) are obtained utilizing NASCET criteria, using the distal internal carotid diameter as the denominator. CONTRAST:  55mL OMNIPAQUE IOHEXOL 350 MG/ML SOLN COMPARISON:  None. FINDINGS: CTA NECK FINDINGS Aortic arch: Great vessel origins are patent. Right carotid system: Common, internal, and external carotid arteries are patent. There is minimal calcified plaque at the bifurcation. No hemodynamically significant stenosis or evidence of dissection. Left carotid system: Common, internal, and external carotid arteries are  patent. There is mild eccentric noncalcified plaque at the bifurcation and proximal ICA without measurable stenosis. No hemodynamically significant stenosis or evidence of dissection. Vertebral arteries: Patent and codominant. Partially obscured proximally by artifact. No significant stenosis or evidence of dissection. Skeleton: Unremarkable. Other neck: Enlarged, multinodular thyroid. Upper chest: No apical lung mass. Review of the MIP images confirms the above findings CTA HEAD FINDINGS Anterior circulation: Intracranial internal carotid arteries are patent with mild calcified plaque. Anterior and middle cerebral arteries are patent. No significant stenosis. Posterior circulation: Intracranial vertebral arteries, basilar artery, and posterior cerebral arteries are patent. There are bilateral posterior communicating arteries. No significant stenosis. Venous sinuses: As permitted by contrast timing, patent. Anatomic variants: Congenitally absent right A1 ACA. Review of the MIP images confirms the above findings IMPRESSION: No large vessel occlusion or hemodynamically significant stenosis. Electronically Signed   By: Macy Mis M.D.   On: 05/20/2019 08:06   DG Chest 2 View  Result Date: 05/20/2019 CLINICAL DATA:  Left arm numbness and weakness. EXAM: CHEST - 2 VIEW COMPARISON:  September 26, 2015 FINDINGS: The heart size is borderline to mildly enlarged. The hila and mediastinum are normal. No pneumothorax. No nodules or masses. No focal infiltrates. No overt edema. IMPRESSION: No active cardiopulmonary disease. Electronically Signed   By: Dorise Bullion III M.D   On: 05/20/2019 08:50   CT HEAD WO CONTRAST  Result Date: 05/19/2019 CLINICAL DATA:  Woke from sleep with left arm weakness, possibly slept on arm. EXAM: CT HEAD WITHOUT CONTRAST TECHNIQUE: Contiguous axial images were obtained from the base of the skull through the vertex without intravenous contrast. COMPARISON:  None. FINDINGS: Brain: Question  a subtle 9 mm hyperattenuating structure seen in the left anterolateral pontomedullary junction (2/9). No adjacent edema. No associated mass effect. No convincing CT evidence of acute infarction, hemorrhage, hydrocephalus, or extra-axial collection. Vascular: Atherosclerotic calcification of the carotid siphons and intradural vertebral arteries. No hyperdense vessel. Skull: No calvarial fracture or suspicious osseous lesion. No scalp swelling or hematoma. Sinuses/Orbits: Paranasal sinuses and mastoid air cells are predominantly clear. Included orbital structures are unremarkable. Other: None IMPRESSION: 1. Question a subtle 9 mm hyperattenuating structure in the left anterolateral pontomedullary junction. No adjacent edema. No associated mass effect. This may represent a small cavernous malformation, however, given the patient's symptoms, recommend MRI with and without contrast for further characterization. 2. No other acute intracranial abnormality. 3. Atherosclerosis of the carotid siphons and intradural vertebral arteries. Electronically Signed   By: Lovena Le M.D.   On: 05/19/2019 21:43   CT ANGIO NECK W OR WO CONTRAST  Result Date: 05/20/2019 CLINICAL DATA:  Right arm weakness and numbness, now resolved EXAM: CT ANGIOGRAPHY HEAD AND NECK TECHNIQUE: Multidetector CT imaging of the head and neck was performed using the standard protocol during bolus administration of intravenous contrast. Multiplanar CT image reconstructions and MIPs were obtained to evaluate the vascular anatomy. Carotid stenosis measurements (when applicable) are obtained utilizing NASCET criteria, using the distal internal carotid diameter as the denominator. CONTRAST:  42mL OMNIPAQUE IOHEXOL 350 MG/ML SOLN COMPARISON:  None. FINDINGS: CTA NECK FINDINGS Aortic arch: Great vessel origins are patent. Right carotid system: Common, internal, and external carotid arteries are patent. There is minimal calcified plaque at the bifurcation. No  hemodynamically significant stenosis or evidence of dissection. Left carotid system: Common, internal, and external carotid arteries are patent. There is mild eccentric noncalcified plaque at the bifurcation and proximal ICA without measurable stenosis. No hemodynamically significant stenosis or evidence of dissection. Vertebral arteries: Patent and codominant. Partially obscured proximally by artifact. No significant stenosis or evidence of dissection. Skeleton: Unremarkable. Other neck: Enlarged, multinodular thyroid. Upper chest: No apical lung mass. Review of the MIP images confirms the above findings CTA HEAD FINDINGS Anterior circulation: Intracranial internal carotid arteries are patent with mild calcified plaque. Anterior and middle cerebral arteries are patent. No significant stenosis. Posterior circulation: Intracranial vertebral arteries, basilar artery, and posterior cerebral arteries are patent. There are bilateral posterior communicating arteries. No significant stenosis. Venous sinuses: As permitted by contrast timing, patent. Anatomic variants: Congenitally absent right A1 ACA. Review of the MIP images confirms the above findings IMPRESSION: No large vessel occlusion or hemodynamically significant stenosis. Electronically Signed   By: Macy Mis M.D.   On: 05/20/2019 08:06   MR BRAIN WO CONTRAST  Result Date: 05/20/2019 CLINICAL DATA:  Initial evaluation for acute weakness, left upper extremity numbness. EXAM: MRI HEAD WITHOUT CONTRAST TECHNIQUE: Multiplanar, multiecho pulse sequences of the brain and surrounding structures were obtained without intravenous contrast. COMPARISON:  Comparison made with prior  CT from 05/19/2019. FINDINGS: Brain: Age-related cerebral volume loss. Patchy T2/FLAIR hyperintensity seen within the periventricular and deep white matter both cerebral hemispheres, most consistent with chronic small vessel ischemic disease, moderate nature. Chronic microvascular ischemic  changes present within the pons. Small remote left thalamic lacunar infarct noted. Patchy small volume cortical infarct measuring approximately 12 mm seen involving the posterior left frontal lobe, precentral gyrus (series 9, image 92). No associated hemorrhage or mass effect. No other evidence for acute or subacute ischemia. Gray-white matter differentiation otherwise maintained. No encephalomalacia to suggest chronic cortical infarction. No evidence for acute or chronic intracranial hemorrhage. No mass lesion, midline shift or mass effect. No hydrocephalus. No extra-axial fluid collection. Incidental note made of a partially empty sella. Midline structures intact. Vascular: Major intracranial vascular flow voids are maintained. Skull and upper cervical spine: Craniocervical junction within normal limits. Upper cervical spine normal. Bone marrow signal intensity within normal limits. No scalp soft tissue abnormality. Sinuses/Orbits: Globes and orbital soft tissues within normal limits. Mild scattered mucosal thickening noted throughout the paranasal sinuses. Paranasal sinuses are otherwise clear. No significant mastoid effusion. Inner ear structures normal. Other: None. IMPRESSION: 1. 12 mm acute ischemic nonhemorrhagic posterior left frontal cortical infarct involving the precentral gyrus. No associated hemorrhage or mass effect. 2. Moderate chronic microvascular ischemic disease. 3. Small remote left thalamic lacunar infarct. Electronically Signed   By: Jeannine Boga M.D.   On: 05/20/2019 03:00   ECHOCARDIOGRAM COMPLETE  Result Date: 05/20/2019   ECHOCARDIOGRAM REPORT   Patient Name:   JADIER CHAUSSE Date of Exam: 05/20/2019 Medical Rec #:  [id number]     Height:       71.0 in Accession #:    [id number]    Weight:       267.2 lb Date of Birth:  10-May-1971     BSA:          2.38 m Patient Age:    32 years      BP:           193/122 mmHg Patient Gender: M             HR:           83 bpm. Exam Location:   Inpatient Procedure: 2D Echo Indications:    Stroke I163.9  History:        Patient has no prior history of Echocardiogram examinations,                 most recent 03/15/2016. CHF; Risk Factors:Hypertension and                 Diabetes.  Sonographer:    Mikki Santee RDCS (AE) Referring Phys: [id number] RONDELL A SMITH IMPRESSIONS  1. Left ventricular ejection fraction, by visual estimation, is 50 to 55%. The left ventricle has low normal function. There is moderately increased left ventricular hypertrophy.  2. Indeterminate diastolic filling due to E-A fusion.  3. The left ventricle has no regional wall motion abnormalities.  4. Global right ventricle has low normal systolic function.The right ventricular size is normal. No increase in right ventricular wall thickness.  5. Left atrial size was mildly dilated.  6. Right atrial size was mildly dilated.  7. Trivial pericardial effusion is present.  8. The mitral valve is grossly normal. Mild mitral valve regurgitation.  9. The tricuspid valve is grossly normal. Tricuspid valve regurgitation is mild. 10. The aortic valve is tricuspid. Aortic valve regurgitation is not visualized. Mild aortic valve sclerosis  without stenosis. 11. The pulmonic valve was grossly normal. Pulmonic valve regurgitation is not visualized. 12. Mildly elevated pulmonary artery systolic pressure. 13. The tricuspid regurgitant velocity is 3.06 m/s, and with an assumed right atrial pressure of 3 mmHg, the estimated right ventricular systolic pressure is mildly elevated at 40.5 mmHg. 14. The inferior vena cava is normal in size with greater than 50% respiratory variability, suggesting right atrial pressure of 3 mmHg. 15. A prior study was performed on 03/15/2016. 16. No significant change from prior study. FINDINGS  Left Ventricle: Left ventricular ejection fraction, by visual estimation, is 50 to 55%. The left ventricle has low normal function. The left ventricle has no regional wall motion  abnormalities. The left ventricular internal cavity size was the left ventricle is normal in size. There is moderately increased left ventricular hypertrophy. Indeterminate diastolic filling due to E-A fusion. Right Ventricle: The right ventricular size is normal. No increase in right ventricular wall thickness. Global RV systolic function is has low normal systolic function. The tricuspid regurgitant velocity is 3.06 m/s, and with an assumed right atrial pressure of 3 mmHg, the estimated right ventricular systolic pressure is mildly elevated at 40.5 mmHg. Left Atrium: Left atrial size was mildly dilated. Right Atrium: Right atrial size was mildly dilated Pericardium: Trivial pericardial effusion is present. Mitral Valve: The mitral valve is grossly normal. Mild mitral valve regurgitation. Tricuspid Valve: The tricuspid valve is grossly normal. Tricuspid valve regurgitation is mild. Aortic Valve: The aortic valve is tricuspid. . There is mild thickening and mild calcification of the aortic valve. Aortic valve regurgitation is not visualized. Mild aortic valve sclerosis is present, with no evidence of aortic valve stenosis. There is mild thickening of the aortic valve. There is mild calcification of the aortic valve. Pulmonic Valve: The pulmonic valve was grossly normal. Pulmonic valve regurgitation is not visualized. Pulmonic regurgitation is not visualized. Aorta: The aortic root and ascending aorta are structurally normal, with no evidence of dilitation. Venous: The inferior vena cava is normal in size with greater than 50% respiratory variability, suggesting right atrial pressure of 3 mmHg. IAS/Shunts: No atrial level shunt detected by color flow Doppler. Additional Comments: A prior study was performed on 03/15/2016.  LEFT VENTRICLE PLAX 2D LVIDd:         5.63 cm  Diastology LVIDs:         4.51 cm  LV e' lateral:   4.73 cm/s LV PW:         1.19 cm  LV E/e' lateral: 20.7 LV IVS:        1.16 cm  LV e' medial:     4.73 cm/s LVOT diam:     2.10 cm  LV E/e' medial:  20.7 LV SV:         63 ml LV SV Index:   24.95 LVOT Area:     3.46 cm  RIGHT VENTRICLE RV S prime:     9.38 cm/s TAPSE (M-mode): 1.4 cm LEFT ATRIUM              Index       RIGHT ATRIUM           Index LA diam:        4.10 cm  1.72 cm/m  RA Area:     18.50 cm LA Vol (A2C):   106.0 ml 44.45 ml/m RA Volume:   48.70 ml  20.42 ml/m LA Vol (A4C):   82.9 ml  34.76 ml/m LA Biplane Vol: 97.3  ml  40.80 ml/m  AORTIC VALVE LVOT Vmax:   67.20 cm/s LVOT Vmean:  47.900 cm/s LVOT VTI:    0.127 m  AORTA Ao Root diam: 3.20 cm MITRAL VALVE                       TRICUSPID VALVE MV Area (PHT): 3.72 cm            TR Peak grad:   37.5 mmHg MV PHT:        59.16 msec          TR Vmax:        369.00 cm/s MV Decel Time: 204 msec MV E velocity: 97.80 cm/s 103 cm/s SHUNTS                                    Systemic VTI:  0.13 m                                    Systemic Diam: 2.10 cm  Eleonore Chiquito MD Electronically signed by Eleonore Chiquito MD Signature Date/Time: 05/20/2019/3:13:43 PM    Final    VAS [place] LOWER EXTREMITY VENOUS (DVT)  Result Date: 05/20/2019  Lower Venous Study Indications: Stroke.  Comparison Study: 09/27/15 previous Performing Technologist: Abram Sander RVS  Examination Guidelines: A complete evaluation includes B-mode imaging, spectral Doppler, color Doppler, and power Doppler as needed of all accessible portions of each vessel. Bilateral testing is considered an integral part of a complete examination. Limited examinations for reoccurring indications may be performed as noted.  +---------+---------------+---------+-----------+----------+--------------+ RIGHT    CompressibilityPhasicitySpontaneityPropertiesThrombus Aging +---------+---------------+---------+-----------+----------+--------------+ CFV      Full           Yes      Yes                                 +---------+---------------+---------+-----------+----------+--------------+ SFJ       Full                                                        +---------+---------------+---------+-----------+----------+--------------+ FV Prox  Full                                                        +---------+---------------+---------+-----------+----------+--------------+ FV Mid   Full                                                        +---------+---------------+---------+-----------+----------+--------------+ FV DistalFull                                                        +---------+---------------+---------+-----------+----------+--------------+  PFV      Full                                                        +---------+---------------+---------+-----------+----------+--------------+ POP      Full           Yes      Yes                                 +---------+---------------+---------+-----------+----------+--------------+ PTV      Full                                                        +---------+---------------+---------+-----------+----------+--------------+ PERO                                                  Not visualized +---------+---------------+---------+-----------+----------+--------------+   +---------+---------------+---------+-----------+----------+--------------+ LEFT     CompressibilityPhasicitySpontaneityPropertiesThrombus Aging +---------+---------------+---------+-----------+----------+--------------+ CFV      Full           Yes      Yes                                 +---------+---------------+---------+-----------+----------+--------------+ SFJ      Full                                                        +---------+---------------+---------+-----------+----------+--------------+ FV Prox  Full                                                        +---------+---------------+---------+-----------+----------+--------------+ FV Mid   Full                                                         +---------+---------------+---------+-----------+----------+--------------+ FV DistalFull                                                        +---------+---------------+---------+-----------+----------+--------------+ PFV      Full                                                        +---------+---------------+---------+-----------+----------+--------------+  POP      Full           Yes      Yes                                 +---------+---------------+---------+-----------+----------+--------------+ PTV      Full                                                        +---------+---------------+---------+-----------+----------+--------------+ PERO                                                  Not visualized +---------+---------------+---------+-----------+----------+--------------+     Summary: Right: There is no evidence of deep vein thrombosis in the lower extremity. No cystic structure found in the popliteal fossa. Left: There is no evidence of deep vein thrombosis in the lower extremity. No cystic structure found in the popliteal fossa.  *See table(s) above for measurements and observations. Electronically signed by Monica Martinez MD on 05/20/2019 at 12:55:14 PM.    Final       Scheduled Meds: . aspirin EC  81 mg Oral Daily  . atorvastatin  80 mg Oral q1800  . clopidogrel  75 mg Oral Daily  . enoxaparin (LOVENOX) injection  40 mg Subcutaneous Q24H  . potassium chloride  40 mEq Oral BID   Continuous Infusions: . [START ON 05/22/2019] sodium chloride       LOS: 1 day      Time spent: 35 minutes   Dessa Phi, DO [facility] 05/21/2019, 10:51 AM   Available via Epic secure chat 7am-7pm After these hours, please refer to coverage provider listed on [email or web address]

## 2019-05-21 NOTE — Progress Notes (Signed)
  Speech Language Pathology Treatment:    Patient Details Name: Richard [name] MRN: [id number] DOB: 04-19-1971 Today's Date: 05/21/2019 Time:  -     Assessment / Plan / Recommendation Clinical Impression:  Patient screened for cognitive or communication changes related to recent stroke. Pt is oriented X 4, communicating at the conversational level and had no noticeable changes in speech and language at onset of stroke symptoms. No further Speech Therapy services needed.                                             GO                Wynelle Bourgeois., MA, CCC-SLP 05/21/2019, 10:06 AM

## 2019-05-22 ENCOUNTER — Encounter (HOSPITAL_COMMUNITY): Payer: Self-pay | Admitting: Internal Medicine

## 2019-05-22 ENCOUNTER — Other Ambulatory Visit: Payer: Self-pay | Admitting: Neurology

## 2019-05-22 ENCOUNTER — Inpatient Hospital Stay (HOSPITAL_COMMUNITY)
Admit: 2019-05-22 | Discharge: 2019-05-22 | Disposition: A | Discharge status: Home / Self Care | Payer: 59 | Attending: Cardiology | Admitting: Cardiology

## 2019-05-22 ENCOUNTER — Encounter (HOSPITAL_COMMUNITY)
Admission: EM | Disposition: A | Discharge status: Home / Self Care | Payer: Self-pay | Source: Home / Self Care | Attending: Internal Medicine

## 2019-05-22 ENCOUNTER — Inpatient Hospital Stay (HOSPITAL_COMMUNITY): Payer: 59 | Admitting: Anesthesiology

## 2019-05-22 ENCOUNTER — Other Ambulatory Visit: Payer: Self-pay | Admitting: Student

## 2019-05-22 DIAGNOSIS — R7303 Prediabetes: Secondary | ICD-10-CM

## 2019-05-22 DIAGNOSIS — I639 Cerebral infarction, unspecified: Secondary | ICD-10-CM

## 2019-05-22 DIAGNOSIS — I6389 Other cerebral infarction: Secondary | ICD-10-CM

## 2019-05-22 DIAGNOSIS — I255 Ischemic cardiomyopathy: Secondary | ICD-10-CM

## 2019-05-22 DIAGNOSIS — I5042 Chronic combined systolic (congestive) and diastolic (congestive) heart failure: Secondary | ICD-10-CM

## 2019-05-22 HISTORY — PX: BUBBLE STUDY: SHX6837

## 2019-05-22 HISTORY — PX: TEE WITHOUT CARDIOVERSION: SHX5443

## 2019-05-22 LAB — BASIC METABOLIC PANEL
Anion gap: 9 (ref 5–15)
BUN: 14 mg/dL (ref 6–20)
CO2: 24 mmol/L (ref 22–32)
Calcium: 8.3 mg/dL — ABNORMAL LOW (ref 8.9–10.3)
Chloride: 107 mmol/L (ref 98–111)
Creatinine, Ser: 1.48 mg/dL — ABNORMAL HIGH (ref 0.61–1.24)
GFR calc Af Amer: >60 mL/min (ref >60)
GFR calc non Af Amer: 55 mL/min — ABNORMAL LOW (ref >60)
Glucose, Bld: 111 mg/dL — ABNORMAL HIGH (ref 70–99)
Potassium: 3.3 mmol/L — ABNORMAL LOW (ref 3.5–5.1)
Sodium: 140 mmol/L (ref 135–145)

## 2019-05-22 LAB — CARDIOLIPIN ANTIBODIES, IGG, IGM, IGA
Anticardiolipin IgA: <9 APL U/mL (ref 0–11)
Anticardiolipin IgG: <9 GPL U/mL (ref 0–14)
Anticardiolipin IgM: <9 MPL U/mL (ref 0–12)

## 2019-05-22 LAB — MAGNESIUM: Magnesium: 1.9 mg/dL (ref 1.7–2.4)

## 2019-05-22 LAB — BETA-2-GLYCOPROTEIN I ABS, IGG/M/A
Beta-2 Glyco I IgG: <9 GPI IgG units (ref 0–20)
Beta-2-Glycoprotein I IgA: <9 GPI IgA units (ref 0–25)
Beta-2-Glycoprotein I IgM: <9 GPI IgM units (ref 0–32)

## 2019-05-22 SURGERY — ECHOCARDIOGRAM, TRANSESOPHAGEAL
Anesthesia: Monitor Anesthesia Care

## 2019-05-22 MED ORDER — POTASSIUM CHLORIDE CRYS ER 20 MEQ PO TBCR: 40.0000 meq | EXTENDED_RELEASE_TABLET | Freq: Once | ORAL | Status: DC

## 2019-05-22 MED ORDER — PROPOFOL 500 MG/50ML IV EMUL
INTRAVENOUS | Status: DC | PRN
  Administered 2019-05-22: 75 ug/kg/min via INTRAVENOUS

## 2019-05-22 MED ORDER — ASPIRIN 81 MG PO TBEC: 81.0000 mg | DELAYED_RELEASE_TABLET | Freq: Every day | ORAL | 2 refills | Status: DC

## 2019-05-22 MED ORDER — ATORVASTATIN CALCIUM 80 MG PO TABS: 80.0000 mg | ORAL_TABLET | Freq: Every day | ORAL | 2 refills | Status: DC

## 2019-05-22 MED ORDER — SODIUM CHLORIDE 0.9 % IV SOLN: INTRAVENOUS | Status: DC

## 2019-05-22 MED ORDER — PROPOFOL 10 MG/ML IV BOLUS
INTRAVENOUS | Status: DC | PRN
  Administered 2019-05-22: 10 mg via INTRAVENOUS

## 2019-05-22 MED ORDER — CLOPIDOGREL BISULFATE 75 MG PO TABS: 75.0000 mg | ORAL_TABLET | Freq: Every day | ORAL | 0 refills | Status: AC

## 2019-05-22 MED ORDER — DEXMEDETOMIDINE HCL 200 MCG/2ML IV SOLN
INTRAVENOUS | Status: DC | PRN
  Administered 2019-05-22: 4 ug via INTRAVENOUS
  Administered 2019-05-22: 8 ug via INTRAVENOUS
  Administered 2019-05-22 (×3): 4 ug via INTRAVENOUS

## 2019-05-22 MED FILL — ATORVASTATIN CALCIUM 80 MG: 80 | 30 days supply | Qty: 30 | Fill #0

## 2019-05-22 MED FILL — ASPIRIN LOW DOSE 81 MG TBEC: 81 | 30 days supply | Qty: 30 | Fill #0

## 2019-05-22 MED FILL — CLOPIDOGREL 75 MG TABLET: 75 | 21 days supply | Qty: 21 | Fill #0

## 2019-05-22 NOTE — Progress Notes (Signed)
  Echocardiogram Echocardiogram Transesophageal has been performed.  Richard [name] A Richard [name] 05/22/2019, 2:20 PM

## 2019-05-22 NOTE — Transfer of Care (Signed)
Immediate Anesthesia Transfer of Care Note  Patient: Richard [name]  Procedure(s) Performed: TRANSESOPHAGEAL ECHOCARDIOGRAM (TEE) (N/A ) BUBBLE STUDY  Patient Location: Endoscopy Unit  Anesthesia Type:MAC  Level of Consciousness: awake, alert  and oriented  Airway & Oxygen Therapy: Patient Spontanous Breathing and Patient connected to nasal cannula oxygen  Post-op Assessment: Report given to RN, Post -op Vital signs reviewed and stable and Patient moving all extremities X 4  Post vital signs: Reviewed and stable  Last Vitals:  Vitals Value Taken Time  BP    Temp    Pulse 66 05/22/19 1407  Resp 33 05/22/19 1407  SpO2 100 % 05/22/19 1407  Vitals shown include unvalidated device data.  Last Pain:  Vitals:   05/22/19 1216  TempSrc: Oral  PainSc: 0-No pain         Complications: No apparent anesthesia complications

## 2019-05-22 NOTE — CV Procedure (Signed)
Brief TEE Note  LVEF 20-25%.  Global hypokinesis. RV systolic function mild-moderately reduced No LA/LAA thrombus or mass No ASD or PFO by color flow Doppler or saline microcavitation study.  Patient developed mild stridor after probe insertion that resolved during the procedure without intervention.  Richard Rupert C. Oval Linsey, MD, [facility] 05/22/2019 1:55 PM

## 2019-05-22 NOTE — Progress Notes (Signed)
STROKE TEAM PROGRESS NOTE   INTERVAL HISTORY Pt sitting in chair, ready to be discharged. He had TEE which showed EF 20-25% with global hypokinesis. Pt admitted that he did not take his heart medication PTA. He will continue to follow up with cardiology as outpt.  Vitals:   05/22/19 1107 05/22/19 1132 05/22/19 1215 05/22/19 1216  BP: (!) 207/155 (!) 178/126  (!) 209/123  Pulse: 80   82  Resp: (!) 24   (!) 32  Temp: 98.2 F (36.8 C)   97.8 F (36.6 C)  TempSrc: Oral   Oral  SpO2: 99%   99%  Weight:   113.4 kg   Height:   5\' 11"  (1.803 m)     CBC:  Recent Labs  Lab 05/19/19 2104  WBC 6.2  NEUTROABS 3.7  HGB 16.2  HCT 49.5  MCV 87.3  PLT [id number]    Basic Metabolic Panel:  Recent Labs  Lab 05/21/19 0227 05/22/19 0220  NA 143 140  K 3.0* 3.3*  CL 106 107  CO2 24 24  GLUCOSE 135* 111*  BUN 15 14  CREATININE 1.69* 1.48*  CALCIUM 8.5* 8.3*  MG 2.0 1.9   Lipid Panel:     Component Value Date/Time   CHOL 268 (H) 05/20/2019 0800   TRIG 104 05/20/2019 0800   HDL 51 05/20/2019 0800   CHOLHDL 5.3 05/20/2019 0800   VLDL 21 05/20/2019 0800   LDLCALC 196 (H) 05/20/2019 0800   HgbA1c:  Lab Results  Component Value Date   HGBA1C 5.8 (H) 05/20/2019   Urine Drug Screen: No results found for: LABOPIA, COCAINSCRNUR, LABBENZ, AMPHETMU, THCU, LABBARB  Alcohol Level     Component Value Date/Time   ETH <10 05/20/2019 0606    IMAGING past 48h No results found.  PHYSICAL EXAM    Temp:  [97.7 F (36.5 C)-98.6 F (37 C)] 97.8 F (36.6 C) (12/18 1216) Pulse Rate:  [75-85] 82 (12/18 1216) Resp:  [18-32] 32 (12/18 1216) BP: (154-209)/(112-155) 209/123 (12/18 1216) SpO2:  [99 %-100 %] 99 % (12/18 1216) Weight:  [113.4 kg] 113.4 kg (12/18 1215)  General - obese, well developed, in no apparent distress.  Ophthalmologic - fundi not visualized due to noncooperation.  Cardiovascular - Regular rhythm and rate.  Mental Status -  Level of arousal and orientation to time,  place, and person were intact. Language including expression, naming, repetition, comprehension was assessed and found intact. Attention span and concentration were normal. Fund of Knowledge was assessed and was intact.  Cranial Nerves II - XII - II - Visual field intact OU. III, IV, VI - Extraocular movements intact. V - Facial sensation intact bilaterally. VII - Facial movement intact bilaterally. VIII - Hearing & vestibular intact bilaterally. X - Palate elevates symmetrically. XI - Chin turning & shoulder shrug intact bilaterally. XII - Tongue protrusion intact.  Motor Strength - The patient's strength was normal in all extremities and pronator drift was absent.  Bulk was normal and fasciculations were absent.   Motor Tone - Muscle tone was assessed at the neck and appendages and was normal.  Reflexes - The patient's reflexes were symmetrical in all extremities and he had no pathological reflexes.  Sensory - Light touch, temperature/pinprick were assessed and were symmetrical.    Coordination - The patient had normal movements in the hands and feet with no ataxia or dysmetria.  Tremor was absent.  Gait and Station - deferred.   ASSESSMENT/PLAN [name] is a 48 y.o.  male with history of systolic heart failure, CKD, diabetes, hypertension with a history of hypertensive emergency documented in the chart, obesity, who has not been taking his medications in an attempt to get rid of medications by more holistic and natural means who woke from a nap with R arm weakness, finally presenting to ED with it resolving.   Stroke:  L frontal lobe infarct embolic secondary to unclear source  CT head L pontomedullary jxn hypoattenuation, ? Infarct. No other acute abnormality. ICA and VA atherosclerosis  MRI  Posterior L frontal lobe cortical infarct (precentral gyrus). Moderate small vessel disease. Small old L thalamic lacune.   CTA head & neck no LVO or significant stenosis   2D  Echo EF 50-55%. No source of embolus   Hypercoagulable labs so far negative but some are still pending  LE dopplers neg DVT  TEE showed EF 20-25%   Pt refused loop recorder implant, agreeable to event monitor following d/c that cardiology will set up.  LDL 196  HgbA1c 5.8  Lovenox 40 mg sq daily for VTE prophylaxis  No antithrombotic prior to admission, now on aspirin 325 mg daily.  Recommend aspirin 81 and Plavix 75 DAPT for 3 weeks and then aspirin alone.  Therapy recommendations:  No therapy needs  Disposition:  Return home  Hypertensive Urgency . BP 200s on presentation  . BP stable on the higher end . Gradually normalize in 3-5 days . Long-term BP goal normotensive  Hyperlipidemia  Home meds:  No statin  Now on lipitor 80  LDL 196, goal < 70  Continue statin at discharge  Pre-Diabetes  HgbA1c 5.8, goal < 7.0  CBGs  SSI  Continue PCP follow-up  History of cardiomyopathy  Hx chronic combined Congestive heart failure  EF 20-25% in April 2017, improved to 50-555% Oct 2017.   Not compliant with Entresto  TTE EF 50-55%  TEE EF 20-25%   Pt was advised to follow up with cardiology as outpt closely - has been following with Dr. Aundra [name]  Other Stroke Risk Factors  Obesity, recommend weight loss, diet and exercise as appropriate   Other Active Problems  CKD stage IIIa, creatinine 1.54->1.69->1.48  Hypokalemia, potassium 3.4->3.0->3.3 - supplement  Hospital day # 2  Neurology will sign off. Please call with questions. Pt will follow up with stroke clinic Dr. Leonie [name] at [facility] in about 4 weeks. Thanks for the consult.   Rosalin Hawking, MD PhD Stroke Neurology 05/22/2019 12:37 PM    To contact Stroke Continuity provider, please refer to [email or web address] After hours, contact General Neurology

## 2019-05-22 NOTE — Discharge Summary (Signed)
Physician Discharge Summary  Richard [name] IAX:[id number] DOB: [birth year] DOA: 05/19/2019  PCP: Patient, No Pcp Per  Admit date: 05/19/2019 Discharge date: 05/22/2019  Admitted From: Home Disposition:  Home  Recommendations for Outpatient Follow-up:  1. Follow up with PCP as scheduled on 06/08/2019  2. Follow up with Neurology in 4 weeks, referral placed at time of discharge  3. Follow up with Cardiology for event monitor on 07/08/2019  4. Would recommend patient establish with cardiology again for heart failure management.  He has seen Dr. Aundra Dubin back in 2018  Discharge Condition: Stable CODE STATUS: Full  Diet recommendation: Heart healthy   Brief/Interim Summary: Richard [name] a 48 y.o.right-handed malewith medical history significant ofhypertension, chronicsystoliccongestive heart failure, prediabetes, chronic kidney disease stage II, who presents with complaints of rightarmnumbness and weakness. Patient reports waking up to go to work last night at around 6 PM and reporting that his hand felt as though it were asleep. He had tried to pick up the help of coffee but was unable to. Last week patient reported that he had been getting headaches, but related to him recently getting fitted for Invisalign.He has not been on any medications in quite some time never followed up on previous. Patient reported that he lost significant amount of weight, but regained 30pounds since the pandemic started. Denies having any significant fever, chills, shortness of breath, cough, nausea, vomiting, or diarrhea. Family history is significant for his dad passed away from a heart attack at the age of 47. Neurology evaluated the patient and initial CT scan of the brain revealed a 9 mm hyperattenuating structure in the left anterior lateral pontomedullary junction for which MRI was recommended. MRI revealed 12 mm acute ischemicposterior left frontal cortical infarct. Patient was not considered a  candidate for TPA.  Patient had completed work-up including echocardiogram, Doppler DVT ultrasound, TEE.  Patient declined loop recorder placement and elected for event monitoring instead.  His symptoms had resolved during hospitalization.  He was evaluated by PT OT prior to discharge.  Discharge Diagnoses:  Principal Problem:   Acute ischemic stroke [facility]) Active Problems:   CKD (chronic kidney disease) stage 2, GFR 60-89 ml/min   Hypokalemia   Obesity   Chronic combined systolic and diastolic CHF, NYHA class 2 (HCC)   Prediabetes   Hypertensive urgency   Acute left frontal CVA -Presented with right arm weakness and numbness, now resolved -MRI revealed acute 12 mm ischemic posterior left frontal cortical infarct -CTA of the head neck did not show large vessel occlusion or significant stenosis -Doppler ultrasound negative for DVT bilaterally -Echocardiogram as below  -Hypercoagulable work up in process  -PT evaluated patient, no PT follow up recommended  -TEE negative for LA/LAA thrombus or mass, no ASD or PFO oh -Aspirin 3m, plavix 721mdaily for 3 weeks, then aspirin alone. Lipitor  -Neurology following; follow-up as an outpatient -Patient evaluated by loop recorder placement.  He had declined implantable loop recorder and elected for event monitoring instead.  Hypertensive urgency -Allow for permissive hypertension in setting of acute stroke -BP today 133/87   Chronic diastolic CHF -Echocardiogram as below, EF 50-55% -Without acute exacerbation of heart failure -Patient reportedly not taking any of his heart failure medications including BiDil, Lasix, Coreg, Entresto, spironolactone  Prediabetes -Hemoglobin A1c 5.8 -Patient reportedly not taking his diabetic medications including Metformin, glipizide  Chronic kidney disease stage IIIa -Baseline creatinine 1.5 -Stable   Hypokalemia -Replaced  Obesity -Estimated body mass index is 37.27 kg/m as calculated from  the following:   Height as of 04/12/17: _0  (1.803 m).   Weight as of 04/30/17: 121.2 kg.   Discharge Instructions  Discharge Instructions    (HEART FAILURE PATIENTS) Call MD:  Anytime you have any of the following symptoms: 1) 3 pound weight gain in 24 hours or 5 pounds in 1 week 2) shortness of breath, with or without a dry hacking cough 3) swelling in the hands, feet or stomach 4) if you have to sleep on extra pillows at night in order to breathe.   Complete by: As directed    Ambulatory referral to Neurology   Complete by: As directed    Follow up in stroke clinic at [facility] with Frann Rider, NP in about 4 weeks. If not available, consider Dr. Antony Contras, Dr. Bess Harvest, or Dr. Sarina Ill.   Call MD for:  difficulty breathing, headache or visual disturbances   Complete by: As directed    Call MD for:  extreme fatigue   Complete by: As directed    Call MD for:  persistant dizziness or light-headedness   Complete by: As directed    Call MD for:  persistant nausea and vomiting   Complete by: As directed    Call MD for:  severe uncontrolled pain   Complete by: As directed    Call MD for:  temperature >100.4   Complete by: As directed    Diet - low sodium heart healthy   Complete by: As directed    Discharge instructions   Complete by: As directed    You were cared for by a hospitalist during your hospital stay. If you have any questions about your discharge medications or the care you received while you were in the hospital after you are discharged, you can call the unit and ask to speak with the hospitalist on call if the hospitalist that took care of you is not available. Once you are discharged, your primary care physician will handle any further medical issues. Please note that NO REFILLS for any discharge medications will be authorized once you are discharged, as it is imperative that you return to your primary care physician (or establish a  relationship with a primary care physician if you do not have one) for your aftercare needs so that they can reassess your need for medications and monitor your lab values.   Increase activity slowly   Complete by: As directed      Allergies as of 05/22/2019   No Known Allergies     Medication List    STOP taking these medications   carvedilol 25 MG tablet Commonly known as: COREG   furosemide 40 MG tablet Commonly known as: LASIX   glipiZIDE 5 MG tablet Commonly known as: GLUCOTROL   isosorbide-hydrALAZINE 20-37.5 MG tablet Commonly known as: BIDIL   metFORMIN 500 MG tablet Commonly known as: GLUCOPHAGE   potassium chloride SA 20 MEQ tablet Commonly known as: KLOR-CON   sacubitril-valsartan 97-103 MG Commonly known as: Entresto   spironolactone 25 MG tablet Commonly known as: ALDACTONE     TAKE these medications   [employer] Blood Glucose [name] w/Device Kit 1 [name] with lancets and test strips for daily BS testing QS 1 month   aspirin 81 MG EC tablet Take 1 tablet (81 mg total) by mouth daily.   atorvastatin 80 MG tablet Commonly known as: LIPITOR Take 1 tablet (80 mg total) by mouth daily at 6 PM.   cholecalciferol 25 MCG (1000  UT) tablet Commonly known as: VITAMIN D3 Take 1,000 Units by mouth daily.   clopidogrel 75 MG tablet Commonly known as: PLAVIX Take 1 tablet (75 mg total) by mouth daily for 21 days.   Fish Oil 1000 MG Caps Take 1 capsule by mouth daily.   glucose blood test strip Commonly known as: ONE TOUCH ULTRA TEST Use as directed to check blood sugar once a day.   multivitamin with minerals Tabs tablet Take 1 tablet by mouth daily.   OneTouch Delica Lancets 67R Misc 1 Device by Other route daily. Use as directed for glucose testing once a day.   QC Tumeric Complex 500 MG Caps Generic drug: Turmeric Take 500 mg by mouth daily.      Follow-up Information    Shirley Friar, PA-C Follow up on 07/08/2019.   Specialty: Physician  Assistant Why: at 1030 for post hospital follow up and discuss event monitor, consider loop Contact information: [place] [address] [phone]        [facility] Follow up in 4 week(s).   Specialty: Neurology Why: stroke clinic. office will call with appt date and time.  Contact information: [place] [phone]       Maximiano Coss, NP Follow up on 06/08/2019.   Specialty: Adult Health Nurse Practitioner Why: Your appointment is at 9:10 am. Please arrive early and bring: picture ID, insurance card and current medications Contact information: [address] [place] [address] [phone]        Larey Dresser, MD. Schedule an appointment as soon as possible for a visit.   Specialty: Cardiology Why: To follow up regarding heart failure Contact information: [address] [place] [address] [phone]          No Known Allergies  Consultations:  Neurology  Cardiology  EP   Procedures/Studies: CT ANGIO HEAD W OR WO CONTRAST  Result Date: 05/20/2019 CLINICAL DATA:  Right arm weakness and numbness, now resolved EXAM: CT ANGIOGRAPHY HEAD AND NECK TECHNIQUE: Multidetector CT imaging of the head and neck was performed using the standard protocol during bolus administration of intravenous contrast. Multiplanar CT image reconstructions and MIPs were obtained to evaluate the vascular anatomy. Carotid stenosis measurements (when applicable) are obtained utilizing NASCET criteria, using the distal internal carotid diameter as the denominator. CONTRAST:  11m OMNIPAQUE IOHEXOL 350 MG/ML SOLN COMPARISON:  None. FINDINGS: CTA NECK FINDINGS Aortic arch: Great vessel origins are patent. Right carotid system: Common, internal, and external carotid arteries are patent. There is minimal calcified plaque at the bifurcation. No hemodynamically significant  stenosis or evidence of dissection. Left carotid system: Common, internal, and external carotid arteries are patent. There is mild eccentric noncalcified plaque at the bifurcation and proximal ICA without measurable stenosis. No hemodynamically significant stenosis or evidence of dissection. Vertebral arteries: Patent and codominant. Partially obscured proximally by artifact. No significant stenosis or evidence of dissection. Skeleton: Unremarkable. Other neck: Enlarged, multinodular thyroid. Upper chest: No apical lung mass. Review of the MIP images confirms the above findings CTA HEAD FINDINGS Anterior circulation: Intracranial internal carotid arteries are patent with mild calcified plaque. Anterior and middle cerebral arteries are patent. No significant stenosis. Posterior circulation: Intracranial vertebral arteries, basilar artery, and posterior cerebral arteries are patent. There are bilateral posterior communicating arteries. No significant stenosis. Venous sinuses: As permitted by contrast timing, patent. Anatomic variants: Congenitally absent right A1 ACA. Review of the MIP  images confirms the above findings IMPRESSION: No large vessel occlusion or hemodynamically significant stenosis. Electronically Signed   By: Macy Mis M.D.   On: 05/20/2019 08:06   DG Chest 2 View  Result Date: 05/20/2019 CLINICAL DATA:  Left arm numbness and weakness. EXAM: CHEST - 2 VIEW COMPARISON:  September 26, 2015 FINDINGS: The heart size is borderline to mildly enlarged. The hila and mediastinum are normal. No pneumothorax. No nodules or masses. No focal infiltrates. No overt edema. IMPRESSION: No active cardiopulmonary disease. Electronically Signed   By: Dorise Bullion III M.D   On: 05/20/2019 08:50   CT HEAD WO CONTRAST  Result Date: 05/19/2019 CLINICAL DATA:  Woke from sleep with left arm weakness, possibly slept on arm. EXAM: CT HEAD WITHOUT CONTRAST TECHNIQUE: Contiguous axial images were obtained from the  base of the skull through the vertex without intravenous contrast. COMPARISON:  None. FINDINGS: Brain: Question a subtle 9 mm hyperattenuating structure seen in the left anterolateral pontomedullary junction (2/9). No adjacent edema. No associated mass effect. No convincing CT evidence of acute infarction, hemorrhage, hydrocephalus, or extra-axial collection. Vascular: Atherosclerotic calcification of the carotid siphons and intradural vertebral arteries. No hyperdense vessel. Skull: No calvarial fracture or suspicious osseous lesion. No scalp swelling or hematoma. Sinuses/Orbits: Paranasal sinuses and mastoid air cells are predominantly clear. Included orbital structures are unremarkable. Other: None IMPRESSION: 1. Question a subtle 9 mm hyperattenuating structure in the left anterolateral pontomedullary junction. No adjacent edema. No associated mass effect. This may represent a small cavernous malformation, however, given the patient's symptoms, recommend MRI with and without contrast for further characterization. 2. No other acute intracranial abnormality. 3. Atherosclerosis of the carotid siphons and intradural vertebral arteries. Electronically Signed   By: Lovena Le M.D.   On: 05/19/2019 21:43   CT ANGIO NECK W OR WO CONTRAST  Result Date: 05/20/2019 CLINICAL DATA:  Right arm weakness and numbness, now resolved EXAM: CT ANGIOGRAPHY HEAD AND NECK TECHNIQUE: Multidetector CT imaging of the head and neck was performed using the standard protocol during bolus administration of intravenous contrast. Multiplanar CT image reconstructions and MIPs were obtained to evaluate the vascular anatomy. Carotid stenosis measurements (when applicable) are obtained utilizing NASCET criteria, using the distal internal carotid diameter as the denominator. CONTRAST:  67m OMNIPAQUE IOHEXOL 350 MG/ML SOLN COMPARISON:  None. FINDINGS: CTA NECK FINDINGS Aortic arch: Great vessel origins are patent. Right carotid system:  Common, internal, and external carotid arteries are patent. There is minimal calcified plaque at the bifurcation. No hemodynamically significant stenosis or evidence of dissection. Left carotid system: Common, internal, and external carotid arteries are patent. There is mild eccentric noncalcified plaque at the bifurcation and proximal ICA without measurable stenosis. No hemodynamically significant stenosis or evidence of dissection. Vertebral arteries: Patent and codominant. Partially obscured proximally by artifact. No significant stenosis or evidence of dissection. Skeleton: Unremarkable. Other neck: Enlarged, multinodular thyroid. Upper chest: No apical lung mass. Review of the MIP images confirms the above findings CTA HEAD FINDINGS Anterior circulation: Intracranial internal carotid arteries are patent with mild calcified plaque. Anterior and middle cerebral arteries are patent. No significant stenosis. Posterior circulation: Intracranial vertebral arteries, basilar artery, and posterior cerebral arteries are patent. There are bilateral posterior communicating arteries. No significant stenosis. Venous sinuses: As permitted by contrast timing, patent. Anatomic variants: Congenitally absent right A1 ACA. Review of the MIP images confirms the above findings IMPRESSION: No large vessel occlusion or hemodynamically significant stenosis. Electronically Signed   By: PMacy Mis  M.D.   On: 05/20/2019 08:06   MR BRAIN WO CONTRAST  Result Date: 05/20/2019 CLINICAL DATA:  Initial evaluation for acute weakness, left upper extremity numbness. EXAM: MRI HEAD WITHOUT CONTRAST TECHNIQUE: Multiplanar, multiecho pulse sequences of the brain and surrounding structures were obtained without intravenous contrast. COMPARISON:  Comparison made with prior CT from 05/19/2019. FINDINGS: Brain: Age-related cerebral volume loss. Patchy T2/FLAIR hyperintensity seen within the periventricular and deep white matter both cerebral  hemispheres, most consistent with chronic small vessel ischemic disease, moderate nature. Chronic microvascular ischemic changes present within the pons. Small remote left thalamic lacunar infarct noted. Patchy small volume cortical infarct measuring approximately 12 mm seen involving the posterior left frontal lobe, precentral gyrus (series 9, image 92). No associated hemorrhage or mass effect. No other evidence for acute or subacute ischemia. Gray-white matter differentiation otherwise maintained. No encephalomalacia to suggest chronic cortical infarction. No evidence for acute or chronic intracranial hemorrhage. No mass lesion, midline shift or mass effect. No hydrocephalus. No extra-axial fluid collection. Incidental note made of a partially empty sella. Midline structures intact. Vascular: Major intracranial vascular flow voids are maintained. Skull and upper cervical spine: Craniocervical junction within normal limits. Upper cervical spine normal. Bone marrow signal intensity within normal limits. No scalp soft tissue abnormality. Sinuses/Orbits: Globes and orbital soft tissues within normal limits. Mild scattered mucosal thickening noted throughout the paranasal sinuses. Paranasal sinuses are otherwise clear. No significant mastoid effusion. Inner ear structures normal. Other: None. IMPRESSION: 1. 12 mm acute ischemic nonhemorrhagic posterior left frontal cortical infarct involving the precentral gyrus. No associated hemorrhage or mass effect. 2. Moderate chronic microvascular ischemic disease. 3. Small remote left thalamic lacunar infarct. Electronically Signed   By: Jeannine Boga M.D.   On: 05/20/2019 03:00   ECHOCARDIOGRAM COMPLETE  Result Date: 05/20/2019   ECHOCARDIOGRAM REPORT   Patient Name:   Richard [name] Date of Exam: 05/20/2019 Medical Rec #:  [id number]     Height:       71.0 in Accession #:    [id number]    Weight:       267.2 lb Date of Birth:  1971-06-03     BSA:          2.38 m  Patient Age:    48 years      BP:           193/122 mmHg Patient Gender: M             HR:           83 bpm. Exam Location:  Inpatient Procedure: 2D Echo Indications:    Stroke I163.9  History:        Patient has no prior history of Echocardiogram examinations,                 most recent 03/15/2016. CHF; Risk Factors:Hypertension and                 Diabetes.  Sonographer:    Mikki Santee RDCS (AE) Referring Phys: [id number] RONDELL A SMITH IMPRESSIONS  1. Left ventricular ejection fraction, by visual estimation, is 50 to 55%. The left ventricle has low normal function. There is moderately increased left ventricular hypertrophy.  2. Indeterminate diastolic filling due to E-A fusion.  3. The left ventricle has no regional wall motion abnormalities.  4. Global right ventricle has low normal systolic function.The right ventricular size is normal. No increase in right ventricular wall thickness.  5. Left atrial size was mildly dilated.  6. Right atrial size was mildly dilated.  7. Trivial pericardial effusion is present.  8. The mitral valve is grossly normal. Mild mitral valve regurgitation.  9. The tricuspid valve is grossly normal. Tricuspid valve regurgitation is mild. 10. The aortic valve is tricuspid. Aortic valve regurgitation is not visualized. Mild aortic valve sclerosis without stenosis. 11. The pulmonic valve was grossly normal. Pulmonic valve regurgitation is not visualized. 12. Mildly elevated pulmonary artery systolic pressure. 13. The tricuspid regurgitant velocity is 3.06 m/s, and with an assumed right atrial pressure of 3 mmHg, the estimated right ventricular systolic pressure is mildly elevated at 40.5 mmHg. 14. The inferior vena cava is normal in size with greater than 50% respiratory variability, suggesting right atrial pressure of 3 mmHg. 15. A prior study was performed on 03/15/2016. 16. No significant change from prior study. FINDINGS  Left Ventricle: Left ventricular ejection fraction, by  visual estimation, is 50 to 55%. The left ventricle has low normal function. The left ventricle has no regional wall motion abnormalities. The left ventricular internal cavity size was the left ventricle is normal in size. There is moderately increased left ventricular hypertrophy. Indeterminate diastolic filling due to E-A fusion. Right Ventricle: The right ventricular size is normal. No increase in right ventricular wall thickness. Global RV systolic function is has low normal systolic function. The tricuspid regurgitant velocity is 3.06 m/s, and with an assumed right atrial pressure of 3 mmHg, the estimated right ventricular systolic pressure is mildly elevated at 40.5 mmHg. Left Atrium: Left atrial size was mildly dilated. Right Atrium: Right atrial size was mildly dilated Pericardium: Trivial pericardial effusion is present. Mitral Valve: The mitral valve is grossly normal. Mild mitral valve regurgitation. Tricuspid Valve: The tricuspid valve is grossly normal. Tricuspid valve regurgitation is mild. Aortic Valve: The aortic valve is tricuspid. . There is mild thickening and mild calcification of the aortic valve. Aortic valve regurgitation is not visualized. Mild aortic valve sclerosis is present, with no evidence of aortic valve stenosis. There is mild thickening of the aortic valve. There is mild calcification of the aortic valve. Pulmonic Valve: The pulmonic valve was grossly normal. Pulmonic valve regurgitation is not visualized. Pulmonic regurgitation is not visualized. Aorta: The aortic root and ascending aorta are structurally normal, with no evidence of dilitation. Venous: The inferior vena cava is normal in size with greater than 50% respiratory variability, suggesting right atrial pressure of 3 mmHg. IAS/Shunts: No atrial level shunt detected by color flow Doppler. Additional Comments: A prior study was performed on 03/15/2016.  LEFT VENTRICLE PLAX 2D LVIDd:         5.63 cm  Diastology LVIDs:          4.51 cm  LV e' lateral:   4.73 cm/s LV PW:         1.19 cm  LV E/e' lateral: 20.7 LV IVS:        1.16 cm  LV e' medial:    4.73 cm/s LVOT diam:     2.10 cm  LV E/e' medial:  20.7 LV SV:         63 ml LV SV Index:   24.95 LVOT Area:     3.46 cm  RIGHT VENTRICLE RV S prime:     9.38 cm/s TAPSE (M-mode): 1.4 cm LEFT ATRIUM              Index       RIGHT ATRIUM           Index  LA diam:        4.10 cm  1.72 cm/m  RA Area:     18.50 cm LA Vol (A2C):   106.0 ml 44.45 ml/m RA Volume:   48.70 ml  20.42 ml/m LA Vol (A4C):   82.9 ml  34.76 ml/m LA Biplane Vol: 97.3 ml  40.80 ml/m  AORTIC VALVE LVOT Vmax:   67.20 cm/s LVOT Vmean:  47.900 cm/s LVOT VTI:    0.127 m  AORTA Ao Root diam: 3.20 cm MITRAL VALVE                       TRICUSPID VALVE MV Area (PHT): 3.72 cm            TR Peak grad:   37.5 mmHg MV PHT:        59.16 msec          TR Vmax:        369.00 cm/s MV Decel Time: 204 msec MV E velocity: 97.80 cm/s 103 cm/s SHUNTS                                    Systemic VTI:  0.13 m                                    Systemic Diam: 2.10 cm  Eleonore Chiquito MD Electronically signed by Eleonore Chiquito MD Signature Date/Time: 05/20/2019/3:13:43 PM    Final    VAS [place] LOWER EXTREMITY VENOUS (DVT)  Result Date: 05/20/2019  Lower Venous Study Indications: Stroke.  Comparison Study: 09/27/15 previous Performing Technologist: Abram Sander RVS  Examination Guidelines: A complete evaluation includes B-mode imaging, spectral Doppler, color Doppler, and power Doppler as needed of all accessible portions of each vessel. Bilateral testing is considered an integral part of a complete examination. Limited examinations for reoccurring indications may be performed as noted.  +---------+---------------+---------+-----------+----------+--------------+ RIGHT    CompressibilityPhasicitySpontaneityPropertiesThrombus Aging +---------+---------------+---------+-----------+----------+--------------+ CFV      Full           Yes      Yes                                  +---------+---------------+---------+-----------+----------+--------------+ SFJ      Full                                                        +---------+---------------+---------+-----------+----------+--------------+ FV Prox  Full                                                        +---------+---------------+---------+-----------+----------+--------------+ FV Mid   Full                                                        +---------+---------------+---------+-----------+----------+--------------+  FV DistalFull                                                        +---------+---------------+---------+-----------+----------+--------------+ PFV      Full                                                        +---------+---------------+---------+-----------+----------+--------------+ POP      Full           Yes      Yes                                 +---------+---------------+---------+-----------+----------+--------------+ PTV      Full                                                        +---------+---------------+---------+-----------+----------+--------------+ PERO                                                  Not visualized +---------+---------------+---------+-----------+----------+--------------+   +---------+---------------+---------+-----------+----------+--------------+ LEFT     CompressibilityPhasicitySpontaneityPropertiesThrombus Aging +---------+---------------+---------+-----------+----------+--------------+ CFV      Full           Yes      Yes                                 +---------+---------------+---------+-----------+----------+--------------+ SFJ      Full                                                        +---------+---------------+---------+-----------+----------+--------------+ FV Prox  Full                                                         +---------+---------------+---------+-----------+----------+--------------+ FV Mid   Full                                                        +---------+---------------+---------+-----------+----------+--------------+ FV DistalFull                                                        +---------+---------------+---------+-----------+----------+--------------+  PFV      Full                                                        +---------+---------------+---------+-----------+----------+--------------+ POP      Full           Yes      Yes                                 +---------+---------------+---------+-----------+----------+--------------+ PTV      Full                                                        +---------+---------------+---------+-----------+----------+--------------+ PERO                                                  Not visualized +---------+---------------+---------+-----------+----------+--------------+     Summary: Right: There is no evidence of deep vein thrombosis in the lower extremity. No cystic structure found in the popliteal fossa. Left: There is no evidence of deep vein thrombosis in the lower extremity. No cystic structure found in the popliteal fossa.  *See table(s) above for measurements and observations. Electronically signed by Monica Martinez MD on 05/20/2019 at 12:55:14 PM.    Final     Echo 05/20/2019 IMPRESSIONS  1. Left ventricular ejection fraction, by visual estimation, is 50 to 55%. The left ventricle has low normal function. There is moderately increased left ventricular hypertrophy.  2. Indeterminate diastolic filling due to E-A fusion.  3. The left ventricle has no regional wall motion abnormalities.  4. Global right ventricle has low normal systolic function.The right ventricular size is normal. No increase in right ventricular wall thickness.  5. Left atrial size was mildly dilated.  6. Right atrial size was  mildly dilated.  7. Trivial pericardial effusion is present.  8. The mitral valve is grossly normal. Mild mitral valve regurgitation.  9. The tricuspid valve is grossly normal. Tricuspid valve regurgitation is mild. 10. The aortic valve is tricuspid. Aortic valve regurgitation is not visualized. Mild aortic valve sclerosis without stenosis. 11. The pulmonic valve was grossly normal. Pulmonic valve regurgitation is not visualized. 12. Mildly elevated pulmonary artery systolic pressure. 13. The tricuspid regurgitant velocity is 3.06 m/s, and with an assumed right atrial pressure of 3 mmHg, the estimated right ventricular systolic pressure is mildly elevated at 40.5 mmHg. 14. The inferior vena cava is normal in size with greater than 50% respiratory variability, suggesting right atrial pressure of 3 mmHg. 15. A prior study was performed on 03/15/2016. 16. No significant change from prior study.    TEE 05/22/2019  LVEF 20-25%.  Global hypokinesis. RV systolic function mild-moderately reduced No LA/LAA thrombus or mass No ASD or PFO by color flow Doppler or saline microcavitation study.    Discharge Exam: Vitals:   05/22/19 1425 05/22/19 1430  BP: 133/87 133/87  Pulse: 62 60  Resp: (!) 23 (!) 25  Temp:  SpO2: 98% 98%     General: Pt is alert, awake, not in acute distress Cardiovascular: RRR, S1/S2 +, no edema Respiratory: CTA bilaterally, no wheezing, no rhonchi, no respiratory distress, no conversational dyspnea  Abdominal: Soft, NT, ND, bowel sounds + Extremities: no edema, no cyanosis Psych: Normal mood and affect, stable judgement and insight     The results of significant diagnostics from this hospitalization (including imaging, microbiology, ancillary and laboratory) are listed below for reference.     Microbiology: Recent Results (from the past 240 hour(s))  SARS CORONAVIRUS 2 (TAT 6-24 HRS) Nasopharyngeal Nasopharyngeal Swab     Status: None   Collection Time:  05/20/19  6:48 AM   Specimen: Nasopharyngeal Swab  Result Value Ref Range Status   SARS Coronavirus 2 NEGATIVE NEGATIVE Final    Comment: (NOTE) SARS-CoV-2 target nucleic acids are NOT DETECTED. The SARS-CoV-2 RNA is generally detectable in upper and lower respiratory specimens during the acute phase of infection. Negative results do not preclude SARS-CoV-2 infection, do not rule out co-infections with other pathogens, and should not be used as the sole basis for treatment or other patient management decisions. Negative results must be combined with clinical observations, patient history, and epidemiological information. The expected result is Negative. Fact Sheet for Patients: SugarRoll.be Fact Sheet for Healthcare Providers: https://www.woods-mathews.com/ This test is not yet approved or cleared by the [place] FDA and  has been authorized for detection and/or diagnosis of SARS-CoV-2 by FDA under an Emergency Use Authorization (EUA). This EUA will remain  in effect (meaning this test can be used) for the duration of the COVID-19 declaration under Section 56 4(b)(1) of the Act, 21 U.S.C. section 360bbb-3(b)(1), unless the authorization is terminated or revoked sooner. Performed at [facility], [place] [address] [place], [place] [address]      Labs: BNP (last 3 results) No results for input(s): BNP in the last 8760 hours. Basic Metabolic Panel: Recent Labs  Lab 05/19/19 2104 05/21/19 0227 05/22/19 0220  NA 141 143 140  K 3.4* 3.0* 3.3*  CL 104 106 107  CO2 _0 GLUCOSE 111* 135* 111*  BUN _1 CREATININE 1.54* 1.69* 1.48*  CALCIUM 9.2 8.5* 8.3*  MG  --  2.0 1.9   Liver Function Tests: Recent Labs  Lab 05/19/19 2104  AST 25  ALT 21  ALKPHOS 44  BILITOT 0.8  PROT 7.1  ALBUMIN 3.8   No results for input(s): LIPASE, AMYLASE in the last 168 hours. No results for input(s): AMMONIA in the last 168  hours. CBC: Recent Labs  Lab 05/19/19 2104  WBC 6.2  NEUTROABS 3.7  HGB 16.2  HCT 49.5  MCV 87.3  PLT 219   Cardiac Enzymes: No results for input(s): CKTOTAL, CKMB, CKMBINDEX, TROPONINI in the last 168 hours. BNP: Invalid input(s): POCBNP CBG: No results for input(s): GLUCAP in the last 168 hours. D-Dimer No results for input(s): DDIMER in the last 72 hours. Hgb A1c Recent Labs    05/20/19 0940  HGBA1C 5.8*   Lipid Profile Recent Labs    05/20/19 0800  CHOL 268*  HDL 51  LDLCALC 196*  TRIG 104  CHOLHDL 5.3   Thyroid function studies Recent Labs    05/20/19 1213  TSH 0.884   Anemia work up Recent Labs    05/21/19 0227  VITAMINB12 257   Urinalysis    Component Value Date/Time   BILIRUBINUR small 01/03/2013 1809   PROTEINUR 300 01/03/2013 1809  UROBILINOGEN 1.0 01/03/2013 1809   NITRITE neg 01/03/2013 1809   LEUKOCYTESUR Negative 01/03/2013 1809   Sepsis Labs Invalid input(s): PROCALCITONIN,  WBC,  LACTICIDVEN Microbiology Recent Results (from the past 240 hour(s))  SARS CORONAVIRUS 2 (TAT 6-24 HRS) Nasopharyngeal Nasopharyngeal Swab     Status: None   Collection Time: 05/20/19  6:48 AM   Specimen: Nasopharyngeal Swab  Result Value Ref Range Status   SARS Coronavirus 2 NEGATIVE NEGATIVE Final    Comment: (NOTE) SARS-CoV-2 target nucleic acids are NOT DETECTED. The SARS-CoV-2 RNA is generally detectable in upper and lower respiratory specimens during the acute phase of infection. Negative results do not preclude SARS-CoV-2 infection, do not rule out co-infections with other pathogens, and should not be used as the sole basis for treatment or other patient management decisions. Negative results must be combined with clinical observations, patient history, and epidemiological information. The expected result is Negative. Fact Sheet for Patients: SugarRoll.be Fact Sheet for Healthcare  Providers: https://www.woods-mathews.com/ This test is not yet approved or cleared by the [place] FDA and  has been authorized for detection and/or diagnosis of SARS-CoV-2 by FDA under an Emergency Use Authorization (EUA). This EUA will remain  in effect (meaning this test can be used) for the duration of the COVID-19 declaration under Section 56 4(b)(1) of the Act, 21 U.S.C. section 360bbb-3(b)(1), unless the authorization is terminated or revoked sooner. Performed at [facility], [place] [address] [place], [place] [address]      Patient was seen and examined on the day of discharge and was found to be in stable condition. Time coordinating discharge: 35 minutes including assessment and coordination of care, as well as examination of the patient.   SIGNED:  Dessa Phi, DO [facility] 05/22/2019, 2:59 PM

## 2019-05-22 NOTE — Anesthesia Preprocedure Evaluation (Signed)
Anesthesia Evaluation  Patient identified by MRN, date of birth, ID band Patient awake    Reviewed: Allergy & Precautions, NPO status , Patient's Chart, lab work & pertinent test results  Airway Mallampati: II  TM Distance: >3 FB Neck ROM: Full    Dental  (+) Teeth Intact, Dental Advisory Given   Pulmonary neg pulmonary ROS,    Pulmonary exam normal breath sounds clear to auscultation       Cardiovascular hypertension, Pt. on medications +CHF  Normal cardiovascular exam Rhythm:Regular Rate:Normal     Neuro/Psych CVA    GI/Hepatic negative GI ROS, Neg liver ROS,   Endo/Other  diabetes, Type 2, Oral Hypoglycemic Agents  Renal/GU Renal InsufficiencyRenal disease     Musculoskeletal negative musculoskeletal ROS (+)   Abdominal   Peds  Hematology negative hematology ROS (+)   Anesthesia Other Findings Day of surgery medications reviewed with the patient.  Reproductive/Obstetrics                             Anesthesia Physical Anesthesia Plan  ASA: III  Anesthesia Plan: MAC   Post-op Pain Management:    Induction: Intravenous  PONV Risk Score and Plan: 1 and Propofol infusion and Treatment may vary due to age or medical condition  Airway Management Planned: Nasal Cannula  Additional Equipment:   Intra-op Plan:   Post-operative Plan:   Informed Consent: I have reviewed the patients History and Physical, chart, labs and discussed the procedure including the risks, benefits and alternatives for the proposed anesthesia with the patient or authorized representative who has indicated his/her understanding and acceptance.     Dental advisory given  Plan Discussed with: CRNA and Anesthesiologist  Anesthesia Plan Comments:         Anesthesia Quick Evaluation

## 2019-05-22 NOTE — H&P (Signed)
Richard [name] is a 48 y.o. male who has presented today for surgery, with the diagnosis of stroke.  The various methods of treatment have been discussed with the patient and family. After consideration of risks, benefits and other options for treatment, the patient has consented to  Procedure(s): TRANSESOPHAGEAL ECHOCARDIOGRAM (TEE) (N/A) as a surgical intervention .  The patient's history has been reviewed, patient examined, no change in status, stable for surgery.  I have reviewed the patient's chart and labs.  Questions were answered to the patient's satisfaction.    [name] C. Oval Linsey, MD, [facility]  05/22/2019 1:30 PM

## 2019-05-22 NOTE — Anesthesia Postprocedure Evaluation (Signed)
Anesthesia Post Note  Patient: Richard [name]  Procedure(s) Performed: TRANSESOPHAGEAL ECHOCARDIOGRAM (TEE) (N/A ) BUBBLE STUDY     Patient location during evaluation: Endoscopy Anesthesia Type: MAC Level of consciousness: awake and alert Pain management: pain level controlled Vital Signs Assessment: post-procedure vital signs reviewed and stable Respiratory status: spontaneous breathing, nonlabored ventilation, respiratory function stable and patient connected to nasal cannula oxygen Cardiovascular status: stable and blood pressure returned to baseline Postop Assessment: no apparent nausea or vomiting Anesthetic complications: no    Last Vitals:  Vitals:   05/22/19 1407 05/22/19 1410  BP: 138/80 138/80  Pulse: 68 66  Resp: (!) 34 (!) 23  Temp:    SpO2: 100% 100%    Last Pain:  Vitals:   05/22/19 1216  TempSrc: Oral  PainSc: 0-No pain                 Catalina Gravel

## 2019-05-22 NOTE — TOC Transition Note (Signed)
Transition of Care [facility]) - CM/SW Discharge Note   Patient Details  Name: Isham Wickman MRN: [id number] Date of Birth: 07/11/1970  Transition of Care [facility]) CM/SW Contact:  Pollie Friar, RN Phone Number: 05/22/2019, 3:03 PM   Clinical Narrative:    Pt is from home alone. Pt states he has people that can check in on him once home.  Pt doesn't feel he will have issues obtaining home meds. Pt denies issues with transportation. Pt without a PCP. CM was able to get him a PcP appt and placed information on the AVS.  Pt has transportation home.   Final next level of care: Home/Self Care Barriers to Discharge: No Barriers Identified   Patient Goals and CMS Choice        Discharge Placement                       Discharge Plan and Services                                     Social Determinants of Health (SDOH) Interventions     Readmission Risk Interventions No flowsheet data found.

## 2019-05-23 LAB — ANTINUCLEAR ANTIBODIES, IFA: ANA Ab, IFA: NEGATIVE

## 2019-05-25 LAB — HOMOCYSTEINE: Homocysteine: 10.4 umol/L (ref 0.0–14.5)

## 2019-05-26 ENCOUNTER — Telehealth: Payer: Self-pay | Admitting: *Deleted

## 2019-05-26 NOTE — Telephone Encounter (Signed)
[employer] to ship a 30 day cardiac event monitor to your home.  Instructions will be available in the monitor kit.

## 2019-06-08 ENCOUNTER — Ambulatory Visit: Payer: Self-pay | Admitting: Registered Nurse

## 2019-06-08 ENCOUNTER — Other Ambulatory Visit: Payer: Self-pay

## 2019-06-08 ENCOUNTER — Encounter: Payer: Self-pay | Admitting: *Deleted

## 2019-06-08 ENCOUNTER — Other Ambulatory Visit: Payer: Self-pay | Admitting: *Deleted

## 2019-06-08 NOTE — Patient Outreach (Addendum)
[facility]) Care Management  06/08/2019  Richard [name] [birth year] [id number]   Subjective: Telephone call to patient's home / mobile number, spoke with patient, and HIPAA verified.  Discussed [employer] EMMI Stroke Google Alert follow up, patient voiced understanding, and is in agreement to follow up.  Patient states he is doing okay, remembers receiving EMMI automated calls,  had to reschedule Primary MD appointment from 06/08/2019 due to being late to appointment due, went to the wrong building, and he was told to reschedule appointment to 06/10/2019.  RNCM advised patient to call ahead if running late to appointment, providers may work with him if they are aware of issues ahead of the appointment time, patient voices understanding, and states will follow up with providers if needed in the future.   Patient states he has a follow up with cardiologist on 07/08/2019, for event monitor discussion, and he is also planning to discuss congestive heart failure follow up care at this appointment.   States he will have a follow up appointment with neurologist on 07/13/2019.  Patient states he is able to manage self care and has assistance as needed.  Patient voices understanding of medical diagnosis and treatment plan.   Patient states he no longer checks blood sugars, still have machine, and supplies if needed, has changed diet, eats healthier, no longer has issues with diabetes or elevated blood sugars.  Medication review completed,  Patient states the Lipitor upset his stomach at times and he is planning to discuss with MD during 06/10/2019 visit.  States he is accessing his  [employer] as needed via member services number on back of card.  Patient states he does not have any education material, EMMI follow up, care coordination, care management, disease monitoring, transportation, or pharmacy needs at this time.   Discussed Advanced  Directives, advised of [place] [facility] Social Worker  Advanced Directives document completion benefit, patient voices understanding, and in agreement to a referral to Education officer, museum will to ask Social Worker to send [employer], and follow up on document completion.   States he is very appreciative of the follow up and is in agreement to receive [place] Management services.  Patient in agreement to EMMI follow up calls as needed and 1 additional call from this RNCM within 10 days to assess for further CM needs.    Objective: Per KPN (Knowledge Performance Now, point of care tool) and chart review, patient hospitalized 05/19/2019 - 05/22/2019 for  Acute ischemic stroke.    Patient also has a history of prediabetes, diabetes, Chronic combined systolic and diastolic congestive heart failure, hypertension, and chronic kidney disease stage 3a.    Assessment: Received [employer] EMMI Stroke Google Alert follow up referral on 06/08/2019.  Red Flag Alert Trigger, Day # 13, patient answered no to the following question: Went to follow-up appointment?   EMMI follow up completed and will follow up to assess for further care management needs.      Plan: RNCM will refer to [place] [facility] Social Worker for Advanced Directives packet follow up.  RNCM will call patient for 2nd telephone outreach attempt within 10 business days, [facility] EMMI follow up, assess for further CM needs, and proceed with case closure, within 10 business days if no return call.        Jolyn Deshmukh H. Annia Friendly, BSN, [place] [facility] Telephonic CM Phone: [phone] Fax: [phone]

## 2019-06-08 NOTE — Patient Outreach (Signed)
[facility]) Care Management  06/08/2019  Richard [name] 1971/04/24 [id number]   Social work referral received today from [facility], Sonda Rumble, to mail Advance Directive documentation and follow up with patient to assist with completion. Packet and Emmi mailed today.   Will follow up with patient within the next two weeks to ensure receipt and assist with completion if needed.  Ronn Melena, BSW Social Worker [phone]

## 2019-06-10 ENCOUNTER — Ambulatory Visit (INDEPENDENT_AMBULATORY_CARE_PROVIDER_SITE_OTHER): Payer: 59 | Admitting: Registered Nurse

## 2019-06-10 ENCOUNTER — Encounter: Payer: Self-pay | Admitting: Registered Nurse

## 2019-06-10 ENCOUNTER — Other Ambulatory Visit: Payer: Self-pay

## 2019-06-10 VITALS — BP 162/118 | HR 86 | Temp 98.4°F | Ht 71.0 in | Wt 280.4 lb

## 2019-06-10 DIAGNOSIS — E1129 Type 2 diabetes mellitus with other diabetic kidney complication: Secondary | ICD-10-CM | POA: Diagnosis not present

## 2019-06-10 DIAGNOSIS — I10 Essential (primary) hypertension: Secondary | ICD-10-CM

## 2019-06-10 DIAGNOSIS — Z13220 Encounter for screening for lipoid disorders: Secondary | ICD-10-CM

## 2019-06-10 DIAGNOSIS — I6930 Unspecified sequelae of cerebral infarction: Secondary | ICD-10-CM | POA: Diagnosis not present

## 2019-06-10 DIAGNOSIS — I639 Cerebral infarction, unspecified: Secondary | ICD-10-CM

## 2019-06-10 DIAGNOSIS — IMO0002 Reserved for concepts with insufficient information to code with codable children: Secondary | ICD-10-CM

## 2019-06-10 DIAGNOSIS — Z1329 Encounter for screening for other suspected endocrine disorder: Secondary | ICD-10-CM | POA: Diagnosis not present

## 2019-06-10 DIAGNOSIS — Z130 Encounter for screening for diseases of the blood and blood-forming organs and certain disorders involving the immune mechanism: Secondary | ICD-10-CM

## 2019-06-10 DIAGNOSIS — Z13228 Encounter for screening for other metabolic disorders: Secondary | ICD-10-CM

## 2019-06-10 DIAGNOSIS — E1165 Type 2 diabetes mellitus with hyperglycemia: Secondary | ICD-10-CM

## 2019-06-10 MED ORDER — ATORVASTATIN CALCIUM 40 MG PO TABS: 40.0000 mg | ORAL_TABLET | Freq: Every day | ORAL | 0 refills | Status: DC

## 2019-06-10 MED ORDER — CARVEDILOL 6.25 MG PO TABS: 6.2500 mg | ORAL_TABLET | Freq: Two times a day (BID) | ORAL | 0 refills | Status: DC

## 2019-06-10 NOTE — Patient Instructions (Signed)
     If you have lab work done today you will be contacted with your lab results within the next 2 weeks.  If you have not heard from us then please contact us. The fastest way to get your results is to register for My Chart.   IF you received an x-ray today, you will receive an invoice from [facility]. Please contact [facility] at [phone] with questions or concerns regarding your invoice.   IF you received labwork today, you will receive an invoice from [facility]. Please contact [facility] at [phone] with questions or concerns regarding your invoice.   Our billing staff will not be able to assist you with questions regarding bills from these companies.  You will be contacted with the lab results as soon as they are available. The fastest way to get your results is to activate your My Chart account. Instructions are located on the last page of this paperwork. If you have not heard from us regarding the results in 2 weeks, please contact this office.        If you have lab work done today you will be contacted with your lab results within the next 2 weeks.  If you have not heard from us then please contact us. The fastest way to get your results is to register for My Chart.   IF you received an x-ray today, you will receive an invoice from [facility]. Please contact [facility] at [phone] with questions or concerns regarding your invoice.   IF you received labwork today, you will receive an invoice from [facility]. Please contact [facility] at [phone] with questions or concerns regarding your invoice.   Our billing staff will not be able to assist you with questions regarding bills from these companies.  You will be contacted with the lab results as soon as they are available. The fastest way to get your results is to activate your My Chart account. Instructions are located on the last page of this paperwork. If you have not heard from us  regarding the results in 2 weeks, please contact this office.     

## 2019-06-10 NOTE — Progress Notes (Signed)
New Patient Office Visit  Subjective:  Patient ID: Richard [name], male    DOB: 01/03/1971  Age: 49 y.o. MRN: [id number]  CC:  Chief Complaint  Patient presents with  . Establish Care    medication management - lipid  . Hypertension    HPI Richard [name] presents for visit to establish care. Recently admitted to hospital with CVA and hypertension.  Briefly, he was admitted after waking up for work with symptoms of unilateral weakness. His BP was stabilized, hypokalemia replenished and he was allowed permissive hypertension. He was started on clopidogrel and atorvastatin. He was discharged stable with follow ups in place for cardiology and neurology, both in early February.   Today, feels well. BP elevated, hypertensive urgency at today's visit. Denies headache, chest pain, shob, doe, visual changes, swelling of feet, and all other CV symptoms at this time. Notes that his main concern is some perceived achiness and GI upset when taking his atorvastatin. Otherwise, no complaints.  We discussed his course at length. He understands that we need to start in antihypertensive medications in a controlled course, and that follow up with cardiology and neurology will be critical to success in his care. We discussed nonpharmacological treatments including lifestyle management. We also discussed that he will need a referral to nephrology for management of his CKD. Past Medical History:  Diagnosis Date  . Acute decompensated heart failure ([place]) 01/03/2013  . Allergy   . CHF (congestive heart failure) (HCC)    systolic  . Chronic kidney disease   . CKD (chronic kidney disease) stage 2, GFR 60-89 ml/min 01/04/2013  . DM (diabetes mellitus) ([place]) 01/06/2013  . Hypertension    hx htn emergency  . Obesity     Past Surgical History:  Procedure Laterality Date  . BUBBLE STUDY  05/22/2019   Procedure: BUBBLE STUDY;  Surgeon: Skeet Latch, MD;  Location: [facility];  Service: Cardiovascular;;  .  LEFT AND RIGHT HEART CATHETERIZATION WITH CORONARY ANGIOGRAM N/A 01/07/2013   Procedure: LEFT AND RIGHT HEART CATHETERIZATION WITH CORONARY ANGIOGRAM;  Surgeon: Birdie Riddle, MD;  Location: [facility] CATH LAB;  Service: Cardiovascular;  Laterality: N/A;  . TEE WITHOUT CARDIOVERSION N/A 05/22/2019   Procedure: TRANSESOPHAGEAL ECHOCARDIOGRAM (TEE);  Surgeon: Skeet Latch, MD;  Location: [facility] ENDOSCOPY;  Service: Cardiovascular;  Laterality: N/A;    Family History  Problem Relation Age of Onset  . Heart disease Father 30       MI  . Hypertension Father   . Hyperlipidemia Father   . Diabetes Mother   . Hypertension Mother     Social History   Socioeconomic History  . Marital status: Single    Spouse name: Not on file  . Number of children: Not on file  . Years of education: Not on file  . Highest education level: Not on file  Occupational History  . Not on file  Tobacco Use  . Smoking status: Never Smoker  . Smokeless tobacco: Never Used  Substance and Sexual Activity  . Alcohol use: No  . Drug use: No  . Sexual activity: Not on file  Other Topics Concern  . Not on file  Social History Narrative   Work or School: Engineer, agricultural, homeless Chief of Staff      Home Situation: lives alone      Spiritual Beliefs: Christian      Lifestyle: 15 minutes dialy walking - working up; working on [employer]  Social Determinants of Health   Financial Resource Strain:   . Difficulty of Paying Living Expenses: Not on file  Food Insecurity:   . Worried About Charity fundraiser in the Last Year: Not on file  . Ran Out of Food in the Last Year: Not on file  Transportation Needs: No Transportation Needs  . Lack of Transportation (Medical): No  . Lack of Transportation (Non-Medical): No  Physical Activity:   . Days of Exercise per Week: Not on file  . Minutes of Exercise per Session: Not on file  Stress:   . Feeling of Stress : Not on file  Social Connections:   .  Frequency of Communication with Friends and Family: Not on file  . Frequency of Social Gatherings with Friends and Family: Not on file  . Attends Religious Services: Not on file  . Active Member of Clubs or Organizations: Not on file  . Attends Archivist Meetings: Not on file  . Marital Status: Not on file  Intimate Partner Violence:   . Fear of Current or Ex-Partner: Not on file  . Emotionally Abused: Not on file  . Physically Abused: Not on file  . Sexually Abused: Not on file    ROS Review of Systems  Constitutional: Negative.   HENT: Negative.   Eyes: Negative.   Respiratory: Negative.   Cardiovascular: Negative.   Gastrointestinal: Negative.   Endocrine: Negative.   Genitourinary: Negative.   Musculoskeletal: Positive for myalgias (back, legs, mild).  Skin: Negative.   Allergic/Immunologic: Negative.   Neurological: Negative.   Hematological: Negative.   Psychiatric/Behavioral: Negative.   All other systems reviewed and are negative.   Objective:   Today's Vitals: BP (!) 162/118   Pulse 86   Temp 98.4 F (36.9 C) (Temporal)   Ht _0  (1.803 m)   Wt 280 lb 6.4 oz (127.2 kg)   SpO2 97%   BMI 39.11 kg/m   Physical Exam Vitals and nursing note reviewed.  Constitutional:      General: He is not in acute distress.    Appearance: Normal appearance. He is not ill-appearing, toxic-appearing or diaphoretic.  Cardiovascular:     Rate and Rhythm: Normal rate and regular rhythm.     Heart sounds: Normal heart sounds.  Pulmonary:     Effort: Pulmonary effort is normal.  Neurological:     General: No focal deficit present.     Mental Status: He is alert and oriented to person, place, and time. Mental status is at baseline.     Cranial Nerves: No cranial nerve deficit.     Motor: No weakness.     Gait: Gait normal.  Psychiatric:        Mood and Affect: Mood normal.        Behavior: Behavior normal.        Thought Content: Thought content normal.         Judgment: Judgment normal.     Assessment & Plan:   Problem List Items Addressed This Visit      Cardiovascular and Mediastinum   Acute ischemic stroke (HCC)   Relevant Medications   atorvastatin (LIPITOR) 40 MG tablet   carvedilol (COREG) 6.25 MG tablet   Other Relevant Orders   CBC with Differential   Lipid panel     Endocrine   Type 2 diabetes, uncontrolled, with renal manifestation (HCC) - Primary   Relevant Medications   atorvastatin (LIPITOR) 40 MG tablet   Other Relevant Orders  HM Diabetes Foot Exam (Completed)   Ambulatory referral to Ophthalmology   Hemoglobin A1c   CBC with Differential   Urinalysis    Other Visit Diagnoses    Lipid screening       Relevant Orders   Lipid panel   Screening for endocrine, metabolic and immunity disorder       Relevant Orders   Comprehensive metabolic panel   Hemoglobin A1c   CBC with Differential   TSH   Essential hypertension       Relevant Medications   atorvastatin (LIPITOR) 40 MG tablet   carvedilol (COREG) 6.25 MG tablet   Other Relevant Orders   Urinalysis      Outpatient Encounter Medications as of 06/10/2019  Medication Sig  . aspirin EC 81 MG EC tablet Take 1 tablet (81 mg total) by mouth daily.  Marland Kitchen atorvastatin (LIPITOR) 80 MG tablet Take 1 tablet (80 mg total) by mouth daily at 6 PM.  . cholecalciferol (VITAMIN D3) 25 MCG (1000 UT) tablet Take 1,000 Units by mouth daily.  . clopidogrel (PLAVIX) 75 MG tablet Take 1 tablet (75 mg total) by mouth daily for 21 days.  . Multiple Vitamin (MULTIVITAMIN WITH MINERALS) TABS tablet Take 1 tablet by mouth daily.  . Omega-3 Fatty Acids (FISH OIL) 1000 MG CAPS Take 1 capsule by mouth daily.  . Turmeric (QC TUMERIC COMPLEX) 500 MG CAPS Take 500 mg by mouth daily.  Marland Kitchen atorvastatin (LIPITOR) 40 MG tablet Take 1 tablet (40 mg total) by mouth daily.  . carvedilol (COREG) 6.25 MG tablet Take 1 tablet (6.25 mg total) by mouth 2 (two) times daily with a meal.  . [DISCONTINUED]  Blood Glucose Monitoring Suppl (ACURA BLOOD GLUCOSE METER) W/DEVICE KIT 1 meter with lancets and test strips for daily BS testing QS 1 month (Patient not taking: Reported on 05/20/2019)  . [DISCONTINUED] glucose blood (ONE TOUCH ULTRA TEST) test strip Use as directed to check blood sugar once a day. (Patient not taking: Reported on 06/08/2019)  . [DISCONTINUED] ONETOUCH DELICA LANCETS 17O MISC 1 Device by Other route daily. Use as directed for glucose testing once a day. (Patient not taking: Reported on 06/08/2019)   No facility-administered encounter medications on file as of 06/10/2019.   PLAN  Start carvedilol 6.91m PO bid for hypertension. Return in 2 days for nurse visit BP check. Return in 1 week for med check with provider. Will likely titrate up dose  Will decrease atorvastatin to 456mPO with dinner and monitor results. LFTs drawn today and urinalysis collected. Will consider alternatives to statin therapy if myalgias continue. Close follow up already in place.  Will refer to nephrology for consultation and management of CKD  Discussed upcoming appts with cards and neuro.  Patient encouraged to call clinic with any questions, comments, or concerns.  Follow-up: Return in about 2 days (around 06/12/2019) for Nurse visit BP check on Friday 1/8 and Med check in 1 week with provider.   RiMaximiano CossNP

## 2019-06-11 LAB — LIPID PANEL
Chol/HDL Ratio: 3.5 ratio (ref 0.0–5.0)
Cholesterol, Total: 149 mg/dL (ref 100–199)
HDL: 43 mg/dL (ref >39)
LDL Chol Calc (NIH): 93 mg/dL (ref 0–99)
Triglycerides: 63 mg/dL (ref 0–149)
VLDL Cholesterol Cal: 13 mg/dL (ref 5–40)

## 2019-06-11 LAB — CBC WITH DIFFERENTIAL/PLATELET
Basophils Absolute: 0 10*3/uL (ref 0.0–0.2)
Basos: 0 %
EOS (ABSOLUTE): 0.3 10*3/uL (ref 0.0–0.4)
Eos: 5 %
Hematocrit: 44 % (ref 37.5–51.0)
Hemoglobin: 15.2 g/dL (ref 13.0–17.7)
Immature Grans (Abs): 0 10*3/uL (ref 0.0–0.1)
Immature Granulocytes: 0 %
Lymphocytes Absolute: 1.1 10*3/uL (ref 0.7–3.1)
Lymphs: 22 %
MCH: 28.3 pg (ref 26.6–33.0)
MCHC: 34.5 g/dL (ref 31.5–35.7)
MCV: 82 fL (ref 79–97)
Monocytes Absolute: 0.5 10*3/uL (ref 0.1–0.9)
Monocytes: 9 %
Neutrophils Absolute: 3.1 10*3/uL (ref 1.4–7.0)
Neutrophils: 64 %
Platelets: 187 10*3/uL (ref 150–450)
RBC: 5.38 x10E6/uL (ref 4.14–5.80)
RDW: 12.5 % (ref 11.6–15.4)
WBC: 5 10*3/uL (ref 3.4–10.8)

## 2019-06-11 LAB — COMPREHENSIVE METABOLIC PANEL
ALT: 16 IU/L (ref 0–44)
AST: 25 IU/L (ref 0–40)
Albumin/Globulin Ratio: 1.6 (ref 1.2–2.2)
Albumin: 4.2 g/dL (ref 4.0–5.0)
Alkaline Phosphatase: 66 IU/L (ref 39–117)
BUN/Creatinine Ratio: 8 — ABNORMAL LOW (ref 9–20)
BUN: 13 mg/dL (ref 6–24)
Bilirubin Total: 0.6 mg/dL (ref 0.0–1.2)
CO2: 24 mmol/L (ref 20–29)
Calcium: 9 mg/dL (ref 8.7–10.2)
Chloride: 102 mmol/L (ref 96–106)
Creatinine, Ser: 1.61 mg/dL — ABNORMAL HIGH (ref 0.76–1.27)
GFR calc Af Amer: 57 mL/min/{1.73_m2} — ABNORMAL LOW (ref >59)
GFR calc non Af Amer: 49 mL/min/{1.73_m2} — ABNORMAL LOW (ref >59)
Globulin, Total: 2.6 g/dL (ref 1.5–4.5)
Glucose: 107 mg/dL — ABNORMAL HIGH (ref 65–99)
Potassium: 3.9 mmol/L (ref 3.5–5.2)
Sodium: 142 mmol/L (ref 134–144)
Total Protein: 6.8 g/dL (ref 6.0–8.5)

## 2019-06-11 LAB — TSH: TSH: 1.31 u[IU]/mL (ref 0.450–4.500)

## 2019-06-11 LAB — HEMOGLOBIN A1C
Est. average glucose Bld gHb Est-mCnc: 111 mg/dL
Hgb A1c MFr Bld: 5.5 % (ref 4.8–5.6)

## 2019-06-11 LAB — URINALYSIS
Bilirubin, UA: NEGATIVE
Glucose, UA: NEGATIVE
Ketones, UA: NEGATIVE
Leukocytes,UA: NEGATIVE
Nitrite, UA: NEGATIVE
RBC, UA: NEGATIVE
Specific Gravity, UA: 1.018 (ref 1.005–1.030)
Urobilinogen, Ur: 0.2 mg/dL (ref 0.2–1.0)
pH, UA: 6.5 (ref 5.0–7.5)

## 2019-06-12 ENCOUNTER — Encounter: Payer: Self-pay | Admitting: Registered Nurse

## 2019-06-12 ENCOUNTER — Other Ambulatory Visit: Payer: Self-pay

## 2019-06-12 ENCOUNTER — Ambulatory Visit (INDEPENDENT_AMBULATORY_CARE_PROVIDER_SITE_OTHER): Payer: 59 | Admitting: Registered Nurse

## 2019-06-12 VITALS — BP 182/118 | HR 88

## 2019-06-12 DIAGNOSIS — Z0130 Encounter for examination of blood pressure without abnormal findings: Secondary | ICD-10-CM

## 2019-06-12 DIAGNOSIS — R944 Abnormal results of kidney function studies: Secondary | ICD-10-CM

## 2019-06-12 DIAGNOSIS — I10 Essential (primary) hypertension: Secondary | ICD-10-CM

## 2019-06-12 NOTE — Progress Notes (Signed)
Labs stable Letter sent to pt on MyChart He will follow up with nephrology  Kathrin Ruddy, NP

## 2019-06-12 NOTE — Progress Notes (Addendum)
BP Checked   The provider spoke to pt about ED precautions and Lab results overview.

## 2019-06-17 ENCOUNTER — Encounter: Payer: Self-pay | Admitting: Registered Nurse

## 2019-06-17 ENCOUNTER — Ambulatory Visit (INDEPENDENT_AMBULATORY_CARE_PROVIDER_SITE_OTHER): Payer: 59 | Admitting: Registered Nurse

## 2019-06-17 ENCOUNTER — Other Ambulatory Visit: Payer: Self-pay

## 2019-06-17 VITALS — BP 172/120 | HR 88 | Temp 97.9°F | Ht 71.5 in | Wt 285.0 lb

## 2019-06-17 DIAGNOSIS — I160 Hypertensive urgency: Secondary | ICD-10-CM | POA: Diagnosis not present

## 2019-06-17 MED ORDER — AMLODIPINE BESYLATE 2.5 MG PO TABS: 2.5000 mg | ORAL_TABLET | Freq: Every day | ORAL | 0 refills | Status: DC

## 2019-06-17 NOTE — Progress Notes (Signed)
Established Patient Office Visit  Subjective:  Patient ID: Richard [name], male    DOB: Jul 02, 1970  Age: 49 y.o. MRN: [id number]  CC:  Chief Complaint  Patient presents with  . Hypertension    HPI Richard [name] presents for 1 week follow up on HTN urgency  As detailed, he was admitted to the hospital in Dec with a stroke, discharged with permissive HTN with instructions to follow up with primary care, cardiology, and neuro.  He has continued to have extremely high BP, however, has fortunately been asymptomatic  Last week we lowered his atorvastatin to 40mg  PO qd with dinner due to some concern for myalgia. He reports that this has had good effect.   We also started him on carvedilol 6.25mg  PO bid, which he has taken without adverse event.  Presents today, BP continues to be elevated to 172/120. Continues to endorse good lifestyle: no salt, red meat, fried food. Mostly water, veggies, fish. Stress at work - we will work to address this one his BP is under control. He agrees with that plan.   Upcoming visits with Cardiology (2/3) and Neurology (2/8).   Past Medical History:  Diagnosis Date  . Acute decompensated heart failure ([place]) 01/03/2013  . Allergy   . CHF (congestive heart failure) (HCC)    systolic  . Chronic kidney disease   . CKD (chronic kidney disease) stage 2, GFR 60-89 ml/min 01/04/2013  . DM (diabetes mellitus) ([place]) 01/06/2013  . Hypertension    hx htn emergency  . Obesity     Past Surgical History:  Procedure Laterality Date  . BUBBLE STUDY  05/22/2019   Procedure: BUBBLE STUDY;  Surgeon: Skeet Latch, MD;  Location: [facility];  Service: Cardiovascular;;  . LEFT AND RIGHT HEART CATHETERIZATION WITH CORONARY ANGIOGRAM N/A 01/07/2013   Procedure: LEFT AND RIGHT HEART CATHETERIZATION WITH CORONARY ANGIOGRAM;  Surgeon: Birdie Riddle, MD;  Location: [facility] CATH LAB;  Service: Cardiovascular;  Laterality: N/A;  . TEE WITHOUT CARDIOVERSION N/A 05/22/2019   Procedure: TRANSESOPHAGEAL ECHOCARDIOGRAM (TEE);  Surgeon: Skeet Latch, MD;  Location: [facility] ENDOSCOPY;  Service: Cardiovascular;  Laterality: N/A;    Family History  Problem Relation Age of Onset  . Heart disease Father 19       MI  . Hypertension Father   . Hyperlipidemia Father   . Diabetes Mother   . Hypertension Mother     Social History   Socioeconomic History  . Marital status: Single    Spouse name: Not on file  . Number of children: Not on file  . Years of education: Not on file  . Highest education level: Not on file  Occupational History  . Not on file  Tobacco Use  . Smoking status: Never Smoker  . Smokeless tobacco: Never Used  Substance and Sexual Activity  . Alcohol use: No  . Drug use: No  . Sexual activity: Not on file  Other Topics Concern  . Not on file  Social History Narrative   Work or School: Engineer, agricultural, homeless Chief of Staff      Home Situation: lives alone      Spiritual Beliefs: Christian      Lifestyle: 15 minutes dialy walking - working up; working on diet            Social Determinants of Radio broadcast assistant Strain:   . Difficulty of Paying Living Expenses: Not on file  Food Insecurity:   . Worried About [employer] of  Food in the Last Year: Not on file  . Ran Out of Food in the Last Year: Not on file  Transportation Needs: No Transportation Needs  . Lack of Transportation (Medical): No  . Lack of Transportation (Non-Medical): No  Physical Activity:   . Days of Exercise per Week: Not on file  . Minutes of Exercise per Session: Not on file  Stress:   . Feeling of Stress : Not on file  Social Connections:   . Frequency of Communication with Friends and Family: Not on file  . Frequency of Social Gatherings with Friends and Family: Not on file  . Attends Religious Services: Not on file  . Active Member of Clubs or Organizations: Not on file  . Attends Archivist Meetings: Not on file  .  Marital Status: Not on file  Intimate Partner Violence:   . Fear of Current or Ex-Partner: Not on file  . Emotionally Abused: Not on file  . Physically Abused: Not on file  . Sexually Abused: Not on file    Outpatient Medications Prior to Visit  Medication Sig Dispense Refill  . aspirin EC 81 MG EC tablet Take 1 tablet (81 mg total) by mouth daily. 30 tablet 2  . atorvastatin (LIPITOR) 40 MG tablet Take 1 tablet (40 mg total) by mouth daily. 90 tablet 0  . atorvastatin (LIPITOR) 80 MG tablet Take 1 tablet (80 mg total) by mouth daily at 6 PM. 30 tablet 2  . carvedilol (COREG) 6.25 MG tablet Take 1 tablet (6.25 mg total) by mouth 2 (two) times daily with a meal. 60 tablet 0  . cholecalciferol (VITAMIN D3) 25 MCG (1000 UT) tablet Take 1,000 Units by mouth daily.    . Multiple Vitamin (MULTIVITAMIN WITH MINERALS) TABS tablet Take 1 tablet by mouth daily.    . Omega-3 Fatty Acids (FISH OIL) 1000 MG CAPS Take 1 capsule by mouth daily.    . Turmeric (QC TUMERIC COMPLEX) 500 MG CAPS Take 500 mg by mouth daily.     No facility-administered medications prior to visit.    No Known Allergies  ROS Review of Systems  Constitutional: Negative.   HENT: Negative.   Eyes: Negative.   Respiratory: Negative.  Negative for chest tightness and shortness of breath.   Cardiovascular: Negative.  Negative for chest pain, palpitations and leg swelling.  Gastrointestinal: Negative.   Endocrine: Negative.   Genitourinary: Negative.   Musculoskeletal: Negative.   Skin: Negative.   Allergic/Immunologic: Negative.   Neurological: Negative.  Negative for headaches.  Hematological: Negative.   Psychiatric/Behavioral: Negative.   All other systems reviewed and are negative.     Objective:    Physical Exam  Constitutional: He is oriented to person, place, and time. He appears well-developed and well-nourished. No distress.  Cardiovascular: Normal rate and regular rhythm.  Pulmonary/Chest: Effort  normal. No respiratory distress.  Neurological: He is alert and oriented to person, place, and time.  Skin: Skin is warm and dry. No rash noted. He is not diaphoretic. No erythema. No pallor.  Psychiatric: He has a normal mood and affect. His behavior is normal. Judgment and thought content normal.  Nursing note and vitals reviewed.   BP (!) 172/120   Pulse 88   Temp 97.9 F (36.6 C) (Temporal)   Ht 5' 11.5" (1.816 m)   Wt 285 lb (129.3 kg)   SpO2 96%   BMI 39.20 kg/m  Wt Readings from Last 3 Encounters:  06/17/19 285 lb (129.3 kg)  06/10/19 280 lb 6.4 oz (127.2 kg)  05/22/19 250 lb (113.4 kg)     Health Maintenance Due  Topic Date Due  . OPHTHALMOLOGY EXAM  03/04/2014    There are no preventive care reminders to display for this patient.  Lab Results  Component Value Date   TSH 1.310 06/10/2019   Lab Results  Component Value Date   WBC 5.0 06/10/2019   HGB 15.2 06/10/2019   HCT 44.0 06/10/2019   MCV 82 06/10/2019   PLT 187 06/10/2019   Lab Results  Component Value Date   NA 142 06/10/2019   K 3.9 06/10/2019   CO2 24 06/10/2019   GLUCOSE 107 (H) 06/10/2019   BUN 13 06/10/2019   CREATININE 1.61 (H) 06/10/2019   BILITOT 0.6 06/10/2019   ALKPHOS 66 06/10/2019   AST 25 06/10/2019   ALT 16 06/10/2019   PROT 6.8 06/10/2019   ALBUMIN 4.2 06/10/2019   CALCIUM 9.0 06/10/2019   ANIONGAP 9 05/22/2019   GFR 73.21 05/07/2014   Lab Results  Component Value Date   CHOL 149 06/10/2019   Lab Results  Component Value Date   HDL 43 06/10/2019   Lab Results  Component Value Date   LDLCALC 93 06/10/2019   Lab Results  Component Value Date   TRIG 63 06/10/2019   Lab Results  Component Value Date   CHOLHDL 3.5 06/10/2019   Lab Results  Component Value Date   HGBA1C 5.5 06/10/2019      Assessment & Plan:   Problem List Items Addressed This Visit      Cardiovascular and Mediastinum   Hypertensive urgency - Primary   Relevant Medications   amLODipine  (NORVASC) 2.5 MG tablet      Meds ordered this encounter  Medications  . amLODipine (NORVASC) 2.5 MG tablet    Sig: Take 1 tablet (2.5 mg total) by mouth daily.    Dispense:  30 tablet    Refill:  0    Order Specific Question:   Supervising Provider    Answer:   Forrest Moron [id number]    Follow-up: Return in about 1 week (around 06/24/2019) for med check BP check w provider.   PLAN  Double dose of carvedilol to 12.5mg  PO bid  Add amlodipine 2.5mg  PO qd  Return in 1 week for BP check and med adjustment  Reviewed ED precautions again with patient  Patient encouraged to call clinic with any questions, comments, or concerns.   Maximiano Coss, NP

## 2019-06-17 NOTE — Patient Instructions (Signed)
° ° ° °  If you have lab work done today you will be contacted with your lab results within the next 2 weeks.  If you have not heard from us then please contact us. The fastest way to get your results is to register for My Chart. ° ° °IF you received an x-ray today, you will receive an invoice from [facility]. Please contact [facility] at [phone] with questions or concerns regarding your invoice.  ° °IF you received labwork today, you will receive an invoice from [facility]. Please contact [facility] at [phone] with questions or concerns regarding your invoice.  ° °Our billing staff will not be able to assist you with questions regarding bills from these companies. ° °You will be contacted with the lab results as soon as they are available. The fastest way to get your results is to activate your My Chart account. Instructions are located on the last page of this paperwork. If you have not heard from us regarding the results in 2 weeks, please contact this office. °  ° ° ° °

## 2019-06-18 ENCOUNTER — Other Ambulatory Visit: Payer: Self-pay | Admitting: *Deleted

## 2019-06-18 NOTE — Patient Outreach (Signed)
[place] [facility]) Care Management  06/18/2019  Richard [name] 12/23/1970 [id number]   Subjective: Telephone call to patient's home  / mobile number, no answer, left HIPAA compliant voicemail message, and requested call back.     Objective: Per KPN (Knowledge Performance Now, point of care tool) and chart review, patient hospitalized 05/19/2019 - 05/22/2019 for Acute ischemic stroke.    Patient also has a history of prediabetes, diabetes, Chronic combined systolic and diastolic congestive heart failure, hypertension, and chronic kidney disease stage 3a.    Assessment: Received [employer] EMMI Stroke Google Alert follow up referral on 06/08/2019.  Red Flag Alert Trigger, Day # 13, patient answered no to the following question: Went to follow-up appointment?   EMMI follow up completed and will follow up to assess for further care management needs.      Plan: RNCM has referred patient to [place] Management Social Worker for Advanced Directives packet follow up.  RNCM will call patient for 3rd telephone outreach attempt within  4 business days, [facility] EMMI follow up, assess for further CM needs, and proceed with case closure, within 10 business days if no return call.       Richard [name], BSN, [place] Management [facility] Telephonic CM Phone: [phone] Fax: [phone]

## 2019-06-19 ENCOUNTER — Ambulatory Visit: Payer: Self-pay | Admitting: *Deleted

## 2019-06-22 ENCOUNTER — Other Ambulatory Visit: Payer: Self-pay

## 2019-06-22 ENCOUNTER — Other Ambulatory Visit: Payer: Self-pay | Admitting: *Deleted

## 2019-06-22 NOTE — Patient Outreach (Signed)
[facility]) Care Management  06/22/2019  Richard [name] Jan 03, 1971 [id number]   Successful follow up call to patient today to ensure receipt of Advance Directives and assist with completion if needed.  Patient did receive Emmi and packet but has not yet reviewed information.  Offered to review with patient today but he declined.  Also offered to schedule call for next week to review but patient declined stating he would call me back if he has any questions.  Social work case being closed at this time.    Ronn Melena, BSW Social Worker [phone]

## 2019-06-22 NOTE — Patient Outreach (Addendum)
[facility]) Care Management  06/22/2019  Richard [name] 1971-04-09 [id number]   Subjective: Telephone call to patient's home / mobile number, spoke with patient, and HIPAA verified.  States he remembers speaking with this RNCM in the past and is doing alright.   States he is actually feeling better overall, getting stronger everyday, and feels this may be a placebo effect.   States his blood pressure remains high, MD is aware, has had weekly office visits with primary MD for blood pressure management, had appointments on 06/10/2019, and 06/17/2019.  States has had medication adjustments, blood pressure has come down some since medication changes, and will have next follow up appointment on 06/24/2019 with primary MD to follow up on blood pressure issues.   States he has a schedule conflict on [id number] and will call MD's office today to reschedule appointment for later this week if appointment available.  Discussed importance of continued follow up with primary MD, to address blood pressure issues, patient voices understanding, and states he will follow up as appropriate.   Patient states he is aware of signs/ symptoms to report, how to reach provider if needed after hours, when to go to ED, and / or call 911. States he is not currently having any symptoms with high blood pressure, no dizziness, no headaches, has not been given a high blood pressure reading to call to MD, but has been advised by MD if symptoms occur,  to report to ED.  Patient states his blood pressure reading today was 161/101 which is lower than it has been in the past.   States he obtained blood pressure monitor on 06/19/2019, has been taking blood pressure daily, monitor has a history feature (saved blood pressure readings), is planning to take monitor to MD appointments, and sharing readings with providers.  Discussed importance of sharing elevated blood pressure reading with neurologist, patient voiced understanding, is  planning to follow up with neurologist to report, and verify if earlier follow up appointment needed.   States his blood pressure was under control prior to covid pandemic, was being managed with weight reduction, was exercising daily, eating healthy, and has not been able to maintain those things due to covid restrictions.  States he is trying to get his blood pressure understanding and resume healthy lifestyle changes.  Patient is aware that [place] [facility] services are only available for approximately 30 days post initial engagement date  and he will follow up with insurance company to see if he is eligible for a hypertension case management / disease management program.   States he works for [employer], has access to blood pressure monitors, and will follow up with with company to see if there are company sponsored hypertension management resources.  Patient states he has heard of people getting depressed after having a stroke, and requested the following EMMI educational video be sent to his email ([email or web address]): Coping with a Health Condition.  States he is also has been experiencing some increase stress due to relationship issues,  lack of support, has a lot going on in his life, and feels that these things may have had a snowball effect, leading to his stroke.  Depression screening assessment completed, results discussed with patient, patient voices understanding, patient in agreement to a referral to [place] [facility] Social Worker for depression screening follow up, has given permission for Social Worker to discuss screening results with patient's provider as needed, and Education officer, museum will provide counseling / behavioral health community  resources per patient's request.    Patient states he does not have history of depression,  and in agreement to receive [place] [facility] Social Worker services.  States he spoke with Education officer, museum earlier today regarding other resources,  advised RNCM  will follow up with [employer] Richard [name] at [facility], and make appropriate additional referral.   Patient states he does not have any education material, EMMI follow up, care coordination, care management, disease monitoring, transportation, or pharmacy needs at this time.  States he is very appreciative of the follow up and is in agreement to receive [place] [facility] services.  Patient in agreement to EMMI follow up calls as needed and 1 additional call from this RNCM within 10 days to assess for further CM needs. Telephone call to [employer], Social Worker at [facility], no answer, left voicemail message, and requested call back. Telephone call from [employer], discussed above Social Worker referral, patient update, and RNCM will send referral to Licensed Education officer, museum for follow up.     Objective:Per KPN (Knowledge Performance Now, point of care tool) and chart review,patient hospitalized 05/19/2019 - 05/22/2019 forAcute ischemic stroke. Patient also has a history of prediabetes, diabetes, Chronic combined systolic and diastoliccongestive heart failure, hypertension, and chronic kidney disease stage 3a.    Assessment: Received [employer] EMMI Stroke Google Alert follow up referral on 06/08/2019. Red Flag Alert Trigger, Day # 13, patient answered no to the following question: Went to follow-up appointment?EMMI follow up completed andwill follow up to assess forfurther care management needs.     Plan: RNCM will send patient the following EMMI educational video: Coping with a Health Condition, via email per patient's request.  RNCM will refer patient to [place] [facility] Social Worker for depression screening follow up, has given permission for Education officer, museum to discuss screening results with patient's provider as needed, and Education officer, museum will provide counseling / behavioral health community resources per patient's  request.   RNCM will call patient for telephone outreach attempt within 10  business days, [facility] EMMI follow up,assess for further CM needs,and proceed with case closure, within 10 business days if no return call.       Isabele Lollar H. Annia Friendly, BSN, [place] [facility] Telephonic CM Phone: [phone] Fax: [phone]

## 2019-06-23 ENCOUNTER — Encounter: Payer: Self-pay | Admitting: *Deleted

## 2019-06-23 ENCOUNTER — Other Ambulatory Visit: Payer: Self-pay | Admitting: *Deleted

## 2019-06-23 NOTE — Patient Outreach (Signed)
[name]) Care Management  06/23/2019  Richard [name] 01-Sep-1970 [id number]   CSW was able to make initial contact with patient today to perform phone assessment, as well as assess and assist with social work needs and services.  CSW introduced self, explained role and types of services provided through [place] [facility] ([place] [facility]).  CSW further explained to patient that CSW works with patient's Telephonic RNCM, also with [place] [facility], Richard [name].  CSW then explained the reason for the call, indicating that Mrs. Richard [name] thought that patient would benefit from social work services and resources to assist with counseling and supportive services for symptoms of depression and grief over the loss of a 2-year relationship.  CSW obtained two HIPAA compliant identifiers from patient, which included patient's name and date of birth.  Patient started off the conversation by stating, "I just feel so bad".  Patient went on to explain that he has been in a 2-year relationship with a woman that just recently ended, and when looking back, he feels "taken advantage of" and "betrayed".  Patient further reported that he has been providing financial assistance to this woman, has always been very supportive of her, but now that he is sick, she no longer wants to be involved in his life.  Patient verbalized feeling like a liability and a burden to her.  Patient stated, "I'm just having a very difficult time trying to function right now and I don't feel like I have anyone that I can talk too about what I am going through".  CSW offered to provide counseling and supportive services to patient, as well as mail patient educational material and a list of resources, for which patient was very receptive.  Patient was tearful throughout the conversation, admitting that he is usually a very strong person, but feels like he is now "falling apart".  Patient indicated that  his girlfriend is unwilling to meet with him face-to-face to discuss her reasons for wanting to end the relationship, that he learned of the breakup through a text message.  CSW inquired as to whether or not patient thought that his girlfriend would be receptive to couples counseling.  Patient was emphatic with his response, reporting that "she would never go for that".  Patient admitted that he is now realizing that she may not be the most suitable companion for him, but regrets all the time, money and interest that he invested in the relationship over the course of two years.  Patient stated, "I was working 80 hour weeks, on nights, weekends and holiday's, to support both of [place]".  Patient believes that his stroke was caused by him working so many hours per week, stating, "I was literally working myself to death".  CSW encouraged patient to take this time and the opportunity to really focus on himself and to figure out what his needs and expectations are.  CSW explained to patient that he really needs to be in a healthy relationship where he can confide in his partner and receive acceptance, empathy, compassion and support.  Patient's ex-girlfriend told him that she does not have the time, nor the energy, to put into their relationship, that she is still grieving the loss of her cat, and that she really needs to focus all of her attention on establishing her career.  Patient stated, "I paid $4000.00 to the vet to try and save her cat the day before she died, and I have tried to be supportive  of her during her time of loss".  CSW was able to provide Cognitive Behavioral Therapy, in addition to Client-Centered Therapy, to patient today, for which patient had a good response.  Toward the end of the 90 minute conversation, patient admitted to "feeling much better, a lot more relaxed, hopeful and calm".  Patient denies experiencing symptoms of homicidal and/or suicidal ideations and is a strong advocate against  domestic violence.  Patient did not wish for CSW to converse with his Primary Care Physician, Dr. Maximiano [name] about possibly prescribing an antidepressant medication, believing that he will be able to manage his symptoms without the help of psychotropic medications.  Patient also denied having an interest in CSW providing him with a list of counseling services in the community, admitting that he may just need to call upon CSW, from time-to-time, for support.  CSW was definitely in agreement with this plan, ensuring that patient has the correct contact information for CSW.  Patient currently lives at home alone and works as a Production assistant, radio at [employer].  Patient indicated that he has definitely cut back on his hours, now that he no longer has to financially support his ex-girlfriend, and that he plans to start eating healthier and exercising at least 30 minutes per day, 5 days per week.  Patient has a vehicle and is able to transport himself to and from all his physician appointments.  Patient admitted to having a great support system, through family members, friends and co-workers, just not anyone to disclose his feelings about his ex-girlfriend to.  Patient stated, "I have to compartmentalize certain feelings, I don't talk with my mom about relationships, but can definitely talk to her about various medical concerns".  After thorough review of patient's EMR (Electronic Medical Record) in Epic, CSW noted that Mrs. Richard [name] spoke with patient about completion of his Advanced Directives ([place] documents).  Mrs. Richard [name] explained to patient that [employer], social work Social worker, would be mailing him an Garment/textile technologist and then follow-up to assist with document completion.  However, when Mrs. Richard [name] contacted patient to assist with completion, patient declined, indicated that he would contact Mrs. Richard [name] directly if he had specific questions and/or  concerns.  CSW explained to patient that CSW is also more than happy to assist patient with completion of these documents.  Patient voiced understanding, agreeing to give it some consideration.      CSW encouraged patient to contact CSW at anytime, Monday through Friday, between the hours of 8:30am and 5:00pm, if patient's symptoms of depression worsen or if patient really needs to process what he is feeling.  Patient stated, "I may just need to check in with you every other day, at least until I get through the worst part of this".  CSW explained to patient that CSW will try and make contact with patient again next week, on Tuesday, June 30, 2019, around 10:00am, if a call is not received from patient in the meantime.  CSW also agreed to mail patient EMMI information, pertaining specifically to Depression, Depression: Medication, Depression: Other Things You Can Do and Talk Therapy For Depression.  Educational materials were placed in the mail to patient today.  Nat Christen, BSW, MSW, LCSW  Licensed Education officer, environmental Health System  Mailing [place] [address] [place], [place] [address] Physical Address-[address], [place], [place] [address] Toll Free Main # [phone] Fax # [phone] Cell #  [phone]  Office # [phone] [email or web address]

## 2019-06-25 ENCOUNTER — Ambulatory Visit: Payer: 59 | Admitting: *Deleted

## 2019-06-30 ENCOUNTER — Other Ambulatory Visit: Payer: Self-pay | Admitting: *Deleted

## 2019-06-30 ENCOUNTER — Encounter: Payer: Self-pay | Admitting: *Deleted

## 2019-06-30 ENCOUNTER — Other Ambulatory Visit: Payer: Self-pay | Admitting: Registered Nurse

## 2019-06-30 DIAGNOSIS — I639 Cerebral infarction, unspecified: Secondary | ICD-10-CM

## 2019-06-30 DIAGNOSIS — I10 Essential (primary) hypertension: Secondary | ICD-10-CM

## 2019-06-30 MED ORDER — CARVEDILOL 6.25 MG PO TABS: 6.2500 mg | ORAL_TABLET | Freq: Two times a day (BID) | ORAL | 0 refills | Status: DC

## 2019-06-30 NOTE — Patient Outreach (Signed)
[facility] Care Management  06/30/2019  Richard [name] August 06, 1970 [id number]   CSW was able to make contact with patient today to follow-up regarding social work services and resources, as well as to assess the need for continued social work involvement.  Patient admitted to feeling much better, but reported, "I am still somewhat stunned about the way my ex-girlfriend is treating me, as if having a stroke was my fault".  Patient went on to say, "My ex-girlfriend was with me for two years, she knows I eat healthy, avoid red meat, sweats and fried foods, that I don't do drugs, smoke cigarettes, or drink alcohol and that I exercise at least 5 times per week".  Patient indicated that he is having a difficult time understanding why his ex-girlfriend is refusing to talk to him or even explain to him the reason(s) for terminating the relationship.  Patient stated, "She won't even meet with me so that we can have a discussion, as if I am diseased or something".  At this point, patient verbalized that he just wants some type of closure from his ex-girlfriend so that he can begin to move on with his life.  CSW was able to offer counseling and supportive services to patient today, in the form of Solution-Focused Brief Therapy or SFBT.  SFBT is a short-term goal-focused evidence-based therapeutic approach which helps patient's change by constructing solutions rather than dwelling on problems.  SFBT allows the patient to see the possibilities rather than the limitations, and accept the past but not allow it to define or predict the future.  CSW also spoke with patient about practicing deep breathing exercises and relaxation techniques when his grief becomes somewhat unbearable, teaching patient the proper skills.  Per patient's request, CSW agreed to e-mail patient the following information:  5 Simple Deep Breathing Exercises to Reduce Stress; Best Relaxation Exercises That Melt Away The Stress!; Top 7  Benefits and Methods for Relaxation.  Last, CSW encouraged patient to consider joining a support group for individuals recovering from a stroke, providing patient with a list of local resources.  Patient admitted to receiving the EMMI information that CSW mailed to his home, reporting that he has already had an opportunity to review it, forming a greater understanding of depression and the effects that these symptoms have on the body.  CSW reminded patient that it is definitely not healthy for him to work 80+ hours per week, encouraging him to try and cut back, allowing himself an opportunity to rest and relax more frequently, as well as obtain more sleep and perform activities that bring him enjoyment.  Patient voiced understanding, indicating that he will definitely not need to work as many hours, now that he is no longer responsible for paying his ex-girlfriends bills each month.  Patient was also encouraged to focus on his health and well-being, in order to try and prevent a second stroke from occurring.  Patient indicated that he definitely plans to try and eliminate a lot of the stressors in his life, in addition to becoming more in-tune with his body and not "pushing himself to the limits".  CSW will perform a case closure on patient, as all goals of treatment have been met from social work standpoint and no additional social work needs have been identified at this time.  CSW will notify patient's Telephonic RNCM with [facility], Sonda Rumble of CSW's plans to close patient's case.  CSW will fax an update to patient's Primary  Care Physician, Dr. Maximiano Coss to ensure that they are aware of CSW's involvement with patient's plan of care.  CSW was able to confirm that patient has the correct contact information for CSW, encouraging patient to contact CSW directly if additional social work needs arise in the near future or if patient requires additional counseling and  supportive services for symptoms of anxiety, depression and/or grief.  Patient voiced understanding and was agreeable to this plan.  Patient was also very appreciative of all services received thus far, admitting that these services have been very beneficial to him.   Nat Christen, BSW, MSW, LCSW  Licensed Education officer, environmental Health System  Mailing [place] [address], [place], [place] [address] Physical Address-[address], [place], [place] [address] Toll Free Main # [phone] Fax # [phone] Cell # [phone]  Office # [phone] [email or web address]

## 2019-07-01 ENCOUNTER — Other Ambulatory Visit: Payer: Self-pay | Admitting: Registered Nurse

## 2019-07-01 DIAGNOSIS — I160 Hypertensive urgency: Secondary | ICD-10-CM

## 2019-07-01 NOTE — Telephone Encounter (Signed)
DX needed

## 2019-07-02 ENCOUNTER — Encounter: Payer: Self-pay | Admitting: *Deleted

## 2019-07-02 ENCOUNTER — Other Ambulatory Visit: Payer: Self-pay | Admitting: *Deleted

## 2019-07-02 NOTE — Patient Outreach (Signed)
[facility]) Care Management  07/02/2019  Richard [name] 09-22-1970 [id number]   Subjective: Telephone call to patient's home / mobile number, spoke with patient, and HIPAA verified.  States he remembers speaking with this RNCM in the past, has received EMMI educational materials, spoken with [facility] [facility] Social Worker Di Kindle),  found information,  Di Kindle, and all the services to be very helpful.  Patient states he is physically feeling good, blood pressure remains high due to emotional stress that he is experiencing from personal life stressors.  States he is taking medications as prescribed, he is maintaining all of his other healthy lifestyle regimens (diet, exercise, weight loss, etc.) and feels the stress is causing his blood pressure to remain high.  States he planning to continue counseling sessions with Di Kindle, this was very helpful, knows he needs to talk with someone, does not need to keep all of his feeling bottled up inside, and feels this may have caused his stroke.  States he is also planning to speaking with [place] regarding ongoing counseling resources.  States blood pressure was 159/111 today, has no headache, or any other symptoms.  Patient states he is aware of signs/ symptoms to report, how to reach provider if needed after hours, when to go to ED, and / or call 911. States he has a follow up appointment with primary MD and cardiologist on 07/08/2019.  States he also followed up with neurologist regarding high blood pressure, was advised to follow up with primary or cardiologist, and will have an appointment with neurologist on 07/13/2019.  Patient states he does not have any education material, EMMI follow up, care coordination, care management, disease monitoring, transportation, or pharmacy needs at this time.States he is very appreciative of the follow up and is in agreement to receive [place]. Patient in agreement to EMMI follow up calls as  needed, 1 additional call from this RNCM within 10 days to assess for further CM needs, and to complete 30 day EMMI Stroke follow up.    Objective:Per KPN (Knowledge Performance Now, point of care tool) and chart review,patient hospitalized 05/19/2019 - 05/22/2019 forAcute ischemic stroke. Patient also has a history of prediabetes, diabetes, Chronic combined systolic and diastoliccongestive heart failure, hypertension, and chronic kidney disease stage 3a.    Assessment: Received [employer] EMMI Stroke Google Alert follow up referral on 06/08/2019. Red Flag Alert Trigger, Day # 13, patient answered no to the following question: Went to follow-up appointment?EMMI follow up completed andwill follow up to assess forfurther care management needs.     Plan: RNCM will call patient fortelephone outreach attempt within10 business days, [facility] EMMI follow up,assess for further CM needs,and proceed with case closure, within 10 business days if no return call.       Tyshauna Finkbiner H. Annia Friendly, BSN, [facility] Telephonic CM Phone: [phone] Fax: [phone]

## 2019-07-03 ENCOUNTER — Telehealth: Payer: Self-pay | Admitting: Registered Nurse

## 2019-07-03 NOTE — Telephone Encounter (Signed)
Copied from [place] [id number]. Topic: General - Other >> Jul 03, 2019 12:57 PM Leward Quan A wrote: Reason for CRM: Patient called to say that he was instructed to take to tabs of the carvedilol (COREG) 6.25 MG tablet and due to this he ran out early. He is asking for a new Rx to be sent to the pharmacy for the 2 tabs daily, per patient he is all out. Please call Ph#  [phone]

## 2019-07-06 ENCOUNTER — Other Ambulatory Visit: Payer: Self-pay | Admitting: Registered Nurse

## 2019-07-06 ENCOUNTER — Telehealth: Payer: Self-pay | Admitting: Registered Nurse

## 2019-07-06 DIAGNOSIS — I10 Essential (primary) hypertension: Secondary | ICD-10-CM

## 2019-07-06 MED ORDER — CARVEDILOL 12.5 MG PO TABS: 12.5000 mg | ORAL_TABLET | Freq: Two times a day (BID) | ORAL | 3 refills | Status: DC

## 2019-07-06 NOTE — Telephone Encounter (Signed)
What is the name of the medication? carvedilol (COREG) 6.25 MG tablet [id number]    Have you contacted your pharmacy to request a refill? Yes, They did not receive a clear order and requesting to be resent   Pt also would like an explaination in regards to his dosage changing   Which pharmacy would you like this sent to?    [id number] [place], [place]  [address] [place] [address]  Phone:  [phone] Fax:  [phone]  DEA #:  [id number]   Patient notified that their request is being sent to the clinical staff for review and that they should receive a call once it is complete. If they do not receive a call within 72 hours they can check with their pharmacy or our office.

## 2019-07-06 NOTE — Telephone Encounter (Signed)
We have been working to increase his dosage to adequately control his BP - we are hoping that working up to a dose of 12.5mg  PO bid will have him at a better level before seeing cardiology. I have sent this dose over to his pharmacy at this time . Thank you  Kathrin Ruddy, NP

## 2019-07-06 NOTE — Telephone Encounter (Signed)
Please advise an patient also would like to know why his dose has been changed

## 2019-07-07 ENCOUNTER — Other Ambulatory Visit: Payer: Self-pay

## 2019-07-07 ENCOUNTER — Telehealth: Payer: Self-pay | Admitting: Registered Nurse

## 2019-07-07 DIAGNOSIS — I10 Essential (primary) hypertension: Secondary | ICD-10-CM

## 2019-07-07 MED ORDER — CARVEDILOL 12.5 MG PO TABS: 12.5000 mg | ORAL_TABLET | Freq: Two times a day (BID) | ORAL | 3 refills | Status: DC

## 2019-07-07 NOTE — Telephone Encounter (Signed)
Informed patient that his prescription was sent to the [facility] pharmacy already.

## 2019-07-07 NOTE — Telephone Encounter (Signed)
Patient recent  precriptions sent to [facility] / [facility] ,went to [place] again ?? Patient  is very upset . [facility] .

## 2019-07-08 ENCOUNTER — Encounter: Payer: Self-pay | Admitting: Student

## 2019-07-08 ENCOUNTER — Ambulatory Visit (INDEPENDENT_AMBULATORY_CARE_PROVIDER_SITE_OTHER): Payer: 59 | Admitting: Student

## 2019-07-08 ENCOUNTER — Other Ambulatory Visit: Payer: Self-pay

## 2019-07-08 VITALS — BP 168/96 | HR 78 | Ht 71.0 in | Wt 274.2 lb

## 2019-07-08 DIAGNOSIS — I10 Essential (primary) hypertension: Secondary | ICD-10-CM

## 2019-07-08 DIAGNOSIS — I5042 Chronic combined systolic (congestive) and diastolic (congestive) heart failure: Secondary | ICD-10-CM

## 2019-07-08 DIAGNOSIS — Z79899 Other long term (current) drug therapy: Secondary | ICD-10-CM | POA: Diagnosis not present

## 2019-07-08 LAB — BASIC METABOLIC PANEL
BUN/Creatinine Ratio: 9 (ref 9–20)
BUN: 12 mg/dL (ref 6–24)
CO2: 22 mmol/L (ref 20–29)
Calcium: 9 mg/dL (ref 8.7–10.2)
Chloride: 102 mmol/L (ref 96–106)
Creatinine, Ser: 1.35 mg/dL — ABNORMAL HIGH (ref 0.76–1.27)
GFR calc Af Amer: 71 mL/min/{1.73_m2} (ref >59)
GFR calc non Af Amer: 61 mL/min/{1.73_m2} (ref >59)
Glucose: 117 mg/dL — ABNORMAL HIGH (ref 65–99)
Potassium: 3.3 mmol/L — ABNORMAL LOW (ref 3.5–5.2)
Sodium: 140 mmol/L (ref 134–144)

## 2019-07-08 MED ORDER — CARVEDILOL 12.5 MG PO TABS: 12.5000 mg | ORAL_TABLET | Freq: Two times a day (BID) | ORAL | 3 refills | Status: DC

## 2019-07-08 MED ORDER — LOSARTAN POTASSIUM 25 MG PO TABS: 25.0000 mg | ORAL_TABLET | Freq: Every day | ORAL | 3 refills | Status: DC

## 2019-07-08 NOTE — Progress Notes (Signed)
PCP:  Maximiano Coss, NP Primary Cardiologist: Dr. Aundra Dubin (Previously) last seen 2018 in CHF clinic.  Electrophysiologist: Dr. Warnell Bureau Skemp is a 49 y.o. male with past medical history of cryptogenic stroke and HTN who presents today for routine electrophysiology followup. They are seen for Dr. Curt Bears.   Since last being seen in our clinic, the patient reports doing well overall.  He did not wear his monitor. He states he was unaware it was coming to him. He does remember our conversation from the hospital. He denies symptoms of palpitations, chest pain, shortness of breath, orthopnea, PND, lower extremity edema, claudication, dizziness, presyncope, syncope, bleeding, or neurologic sequela. The patient is tolerating medications without difficulties.   He is frustrated about the lability of his BP, but has been off coreg x 1 week, and admitted non-compliance with his HF meds for an indeterminate amount of time prior to admission for stroke.   The patient feels that he is tolerating medications without difficulties and is otherwise without complaint today.   Past Medical History:  Diagnosis Date  . Acute decompensated heart failure ([place]) 01/03/2013  . Allergy   . CHF (congestive heart failure) ([facility])    systolic  . Chronic kidney disease   . CKD (chronic kidney disease) stage 2, GFR 60-89 ml/min 01/04/2013  . DM (diabetes mellitus) ([place]) 01/06/2013  . Hypertension    hx htn emergency  . Obesity    Past Surgical History:  Procedure Laterality Date  . BUBBLE STUDY  05/22/2019   Procedure: BUBBLE STUDY;  Surgeon: Skeet Latch, MD;  Location: [facility];  Service: Cardiovascular;;  . LEFT AND RIGHT HEART CATHETERIZATION WITH CORONARY ANGIOGRAM N/A 01/07/2013   Procedure: LEFT AND RIGHT HEART CATHETERIZATION WITH CORONARY ANGIOGRAM;  Surgeon: Birdie Riddle, MD;  Location: [facility] CATH LAB;  Service: Cardiovascular;  Laterality: N/A;  . TEE WITHOUT CARDIOVERSION N/A 05/22/2019   Procedure: TRANSESOPHAGEAL ECHOCARDIOGRAM (TEE);  Surgeon: Skeet Latch, MD;  Location[facility] ENDOSCOPY;  Service: Cardiovascular;  Laterality: N/A;    Current Outpatient Medications  Medication Sig Dispense Refill  . amLODipine (NORVASC) 2.5 MG tablet Take 2.5 mg by mouth daily.    Marland Kitchen aspirin EC 81 MG EC tablet Take 1 tablet (81 mg total) by mouth daily. 30 tablet 2  . atorvastatin (LIPITOR) 40 MG tablet Take 1 tablet (40 mg total) by mouth daily. 90 tablet 0  . cholecalciferol (VITAMIN D3) 25 MCG (1000 UT) tablet Take 1,000 Units by mouth daily.    . Multiple Vitamin (MULTIVITAMIN WITH MINERALS) TABS tablet Take 1 tablet by mouth daily.    . Omega-3 Fatty Acids (FISH OIL) 1000 MG CAPS Take 1 capsule by mouth daily.    . Turmeric (QC TUMERIC COMPLEX) 500 MG CAPS Take 500 mg by mouth daily.    . carvedilol (COREG) 12.5 MG tablet Take 1 tablet (12.5 mg total) by mouth 2 (two) times daily with a meal. 180 tablet 3  . losartan (COZAAR) 25 MG tablet Take 1 tablet (25 mg total) by mouth daily. 90 tablet 3   No current facility-administered medications for this visit.    No Known Allergies  Social History   Socioeconomic History  . Marital status: Single    Spouse name: Not on file  . Number of children: Not on file  . Years of education: Not on file  . Highest education level: Not on file  Occupational History  . Not on file  Tobacco Use  . Smoking status: Never  Smoker  . Smokeless tobacco: Never Used  Substance and Sexual Activity  . Alcohol use: No  . Drug use: No  . Sexual activity: Not on file  Other Topics Concern  . Not on file  Social History Narrative   Work or School: Engineer, agricultural, homeless Chief of Staff      Home Situation: lives alone      Spiritual Beliefs: Christian      Lifestyle: 15 minutes dialy walking - working up; working on diet            Social Determinants of Radio broadcast assistant Strain: [place]   . Difficulty of Paying  Living Expenses: Not hard at all  Food Insecurity: No Food Insecurity  . Worried About Charity fundraiser in the Last Year: Never true  . Ran Out of Food in the Last Year: Never true  Transportation Needs: No Transportation Needs  . Lack of Transportation (Medical): No  . Lack of Transportation (Non-Medical): No  Physical Activity: Sufficiently Active  . Days of Exercise per Week: 5 days  . Minutes of Exercise per Session: 30 min  Stress: Stress Concern Present  . Feeling of Stress : Rather much  Social Connections: Somewhat Isolated  . Frequency of Communication with Friends and Family: Three times a week  . Frequency of Social Gatherings with Friends and Family: Three times a week  . Attends Religious Services: 1 to 4 times per year  . Active Member of Clubs or Organizations: No  . Attends Archivist Meetings: Never  . Marital Status: Never married  Intimate Partner Violence: Not At Risk  . Fear of Current or Ex-Partner: No  . Emotionally Abused: No  . Physically Abused: No  . Sexually Abused: No     Review of Systems: General: No chills, fever, night sweats or weight changes  Cardiovascular:  No chest pain, dyspnea on exertion, edema, orthopnea, palpitations, paroxysmal nocturnal dyspnea Dermatological: No rash, lesions or masses Respiratory: No cough, dyspnea Urologic: No hematuria, dysuria Abdominal: No nausea, vomiting, diarrhea, bright red blood per rectum, melena, or hematemesis Neurologic: No visual changes, weakness, changes in mental status All other systems reviewed and are otherwise negative except as noted above.  Physical Exam: Vitals:   07/08/19 1029  BP: (!) 168/96  Pulse: 78  SpO2: 98%  Weight: 274 lb 3.2 oz (124.4 kg)  Height: 5\' 11"  (1.803 m)    GEN- The patient is well appearing, alert and oriented x 3 today.   HEENT: normocephalic, atraumatic; sclera clear, conjunctiva pink; hearing intact; oropharynx clear; neck supple, no JVP Lymph-  no cervical lymphadenopathy Lungs- Clear to ausculation bilaterally, normal work of breathing.  No wheezes, rales, rhonchi Heart- Regular rate and rhythm, no murmurs, rubs or gallops, PMI not laterally displaced GI- soft, non-tender, non-distended, bowel sounds present, no hepatosplenomegaly Extremities- no clubbing, cyanosis, or edema; DP/PT/radial pulses 2+ bilaterally MS- no significant deformity or atrophy Skin- warm and dry, no rash or lesion Psych- euthymic mood, full affect Neuro- strength and sensation are intact  EKG is not ordered.   Assessment and Plan:  1. Cryptogenic stroke Pt had 30 day event monitor ordered per chart. He states he has multiple packages sitting in his garage "since christmas" letting them "air out" from [place].  He will go home and look for the monitor, and wear if he finds it. He will let us know if he does not.  Refuses loop consideration, thus, we will see  him as needed based on results of monitor once worn.  As below, pt with recurrent systolic CHF. May be candidate for ICD if does not improve back on GDMT.   2. Chronic systolic CHF Nonischemic cardiomyopathy, likely 2/2 poorly controlled HTN. HIV negative, SPEP negative, no M spike. UPEP negative.  Had [place] in 2014, negative for CAD. EF was 25-30% at that time. Echo 03/2016 with EF 50-55%, normal RV.  EF previously improved to 50-55%, now back down to 20-25% on TEE 12/28. Previously followed by CHF team; non-compliant with medication leading up to admission for cryptogenic stroke. Refill coreg. PCP to follow moving forward.  NYHA I symptoms currently Volume status stable on exam Refill coreg 12.5 mg BID Resume Losartan at 25 mg daily with CKD III/IV; Consider back on Entresto as tolerated. BMET today.  Consider spironolactone as able pending BMET Consider Bidil as tolerated pending f/u with CHF team Will send message to CHF team to get him back in.  We discussed this today.   3. HTN Will manage  in the setting of treating his recurrent CHF. Plan as above.   Needs to follow back up with CHF team. EP will see as needed.   Shirley Friar, PA-C  07/08/19 11:05 AM

## 2019-07-08 NOTE — Patient Instructions (Addendum)
Medication Instructions:  START LOSARTAN 25 mg Daily *If you need a refill on your cardiac medications before your next appointment, please call your [facility] BMET If you have labs (blood work) drawn today and your tests are completely normal, you will receive your results only by: Marland Kitchen MyChart Message (if you have MyChart) OR . A paper copy in the mail If you have any lab test that is abnormal or we need to change your treatment, we will call you to review the results.  Testing/Procedures: none  Follow-Up: As NEEDED At [facility], you and your health needs are our priority.  As part of our continuing mission to provide you with exceptional heart care, we have created designated Provider Care Teams.  These Care Teams include your primary Cardiologist (physician) and Advanced Practice Providers (APPs -  Physician Assistants and Nurse Practitioners) who all work together to provide you with the care you need, when you need it.    Other Instructions Follow up with Heart Failure Clinic, SOMEONE WILL CALL YOU

## 2019-07-09 ENCOUNTER — Ambulatory Visit: Payer: Self-pay | Admitting: *Deleted

## 2019-07-10 ENCOUNTER — Ambulatory Visit: Payer: Self-pay | Admitting: *Deleted

## 2019-07-13 ENCOUNTER — Other Ambulatory Visit: Payer: Self-pay

## 2019-07-13 ENCOUNTER — Ambulatory Visit (INDEPENDENT_AMBULATORY_CARE_PROVIDER_SITE_OTHER): Payer: 59 | Admitting: Neurology

## 2019-07-13 ENCOUNTER — Encounter: Payer: Self-pay | Admitting: Neurology

## 2019-07-13 VITALS — BP 159/107 | HR 81 | Temp 97.0°F | Ht 71.0 in | Wt 278.0 lb

## 2019-07-13 DIAGNOSIS — E6609 Other obesity due to excess calories: Secondary | ICD-10-CM | POA: Diagnosis not present

## 2019-07-13 DIAGNOSIS — Z6835 Body mass index (BMI) 35.0-35.9, adult: Secondary | ICD-10-CM

## 2019-07-13 DIAGNOSIS — I639 Cerebral infarction, unspecified: Secondary | ICD-10-CM | POA: Diagnosis not present

## 2019-07-13 MED ORDER — COENZYME Q10 30 MG PO CAPS: 200.0000 mg | ORAL_CAPSULE | Freq: Every day | ORAL | 1 refills | Status: DC

## 2019-07-13 NOTE — Progress Notes (Signed)
[facility] [address] [place]. [place] [address] [phone]       OFFICE CONSULT NOTE  [name] Date of Birth:  December 27, 1970 Medical Record Number:  [id number]   Referring MD: Rosalin Hawking Reason for Referral: Stroke HPI: [name] is a 49 year old male seen today for an initial office consultation visit for stroke.  History is obtained from the patient, review of electronic medical records and I personally reviewed imaging films in PACS. He is a 49 year old African-American male with history of systolic heart failure, chronic kidney disease, hypertension, diabetes, hypertensive emergency, obesity who admits to medication noncompliance in an attempt to get rid of medication by more holistic and natural means presented to [facility] emergency room on 05/19/2019 with sudden onset of right arm numbness and weakness.  He states he was fine till took a nap upon awakening he noticed that his arm felt as if it had gone to sleep.  He has trouble bending his finger and using it as well.  The symptoms started resolving in an hour or so by the time he reached emergency room.  Upon evaluation his blood pressure was found to be in the systolic [id number] and above range but his symptoms had resolved.  MRI scan of the brain was obtained which showed a left frontal MCA branch embolic-looking infarct.  Patient did have a family history of heart attacks in the young age and father died at age 31 MI.  CT angiogram of the brain and neck showed no significant large vessel stenosis or occlusion.  Transesophageal echocardiogram showed ejection fraction 20 to 25% surprisingly even though the transthoracic echo had shown a normal ejection fraction..  No definite clot was noted.  Lower extremity venous Dopplers are negative for DVT LDL cholesterol was elevated he was started on statin and follow-up lipid profile on 06/10/2019 shows LDL yet elevated at 93 mg percent.  Hemoglobin A1c was 5.5.   Hypercoagulable panel labs were also sent which were all negative.  Plans were to do a 30-day external heart monitor with for unclear reason that did not happen but has been set up now and he will be getting his monitor soon.  He states his blood pressure continues to be elevated with systolic in the 1 [id number] range.  Patient does admit to snoring but he has never been formally evaluated for sleep apnea.  He is tolerating aspirin well without bruising or bleeding.  He is on Lipitor 40 mg and 80 mg on alternate days as he was unable to tolerate 80 mg daily due to muscle aches and pains.  He has no other prior history of stroke TIA significant neurological problems.  Patient was not prescribed any home or outpatient therapies as his symptoms resolved almost completely while in the hospital.  He has no new complaints today. ROS:   14 system review of systems is positive for no complaints today all systems negative  PMH:  Past Medical History:  Diagnosis Date  . Acute decompensated heart failure ([place]) 01/03/2013  . Allergy   . CHF (congestive heart failure) ([facility])    systolic  . Chronic kidney disease   . CKD (chronic kidney disease) stage 2, GFR 60-89 ml/min 01/04/2013  . DM (diabetes mellitus) ([place]) 01/06/2013  . Hypertension    hx htn emergency  . Obesity   . Stroke [facility])     Social History:  Social History   Socioeconomic History  . Marital status: Single    Spouse  name: Not on file  . Number of children: Not on file  . Years of education: Not on file  . Highest education level: Not on file  Occupational History  . Not on file  Tobacco Use  . Smoking status: Never Smoker  . Smokeless tobacco: Never Used  Substance and Sexual Activity  . Alcohol use: No  . Drug use: No  . Sexual activity: Not on file  Other Topics Concern  . Not on file  Social History Narrative   Work or School: Engineer, agricultural, homeless Chief of Staff      Home Situation: lives alone      Spiritual  Beliefs: Christian      Lifestyle: 15 minutes dialy walking - working up; working on diet            Social Determinants of Radio broadcast assistant Strain: [place]   . Difficulty of Paying Living Expenses: Not hard at all  Food Insecurity: No Food Insecurity  . Worried About Charity fundraiser in the Last Year: Never true  . Ran Out of Food in the Last Year: Never true  Transportation Needs: No Transportation Needs  . Lack of Transportation (Medical): No  . Lack of Transportation (Non-Medical): No  Physical Activity: Sufficiently Active  . Days of Exercise per Week: 5 days  . Minutes of Exercise per Session: 30 min  Stress: Stress Concern Present  . Feeling of Stress : Rather much  Social Connections: Somewhat Isolated  . Frequency of Communication with Friends and Family: Three times a week  . Frequency of Social Gatherings with Friends and Family: Three times a week  . Attends Religious Services: 1 to 4 times per year  . Active Member of Clubs or Organizations: No  . Attends Archivist Meetings: Never  . Marital Status: Never married  Intimate Partner Violence: Not At Risk  . Fear of Current or Ex-Partner: No  . Emotionally Abused: No  . Physically Abused: No  . Sexually Abused: No    Medications:   Current Outpatient Medications on File Prior to Visit  Medication Sig Dispense Refill  . amLODipine (NORVASC) 2.5 MG tablet Take 2.5 mg by mouth daily.    Marland Kitchen aspirin EC 81 MG EC tablet Take 1 tablet (81 mg total) by mouth daily. 30 tablet 2  . atorvastatin (LIPITOR) 40 MG tablet Take 1 tablet (40 mg total) by mouth daily. 90 tablet 0  . carvedilol (COREG) 12.5 MG tablet Take 1 tablet (12.5 mg total) by mouth 2 (two) times daily with a meal. 180 tablet 3  . cholecalciferol (VITAMIN D3) 25 MCG (1000 UT) tablet Take 1,000 Units by mouth daily.    Marland Kitchen losartan (COZAAR) 25 MG tablet Take 1 tablet (25 mg total) by mouth daily. 90 tablet 3  . Multiple Vitamin  (MULTIVITAMIN WITH MINERALS) TABS tablet Take 1 tablet by mouth daily.    . Omega-3 Fatty Acids (FISH OIL) 1000 MG CAPS Take 1 capsule by mouth daily.    . Turmeric (QC TUMERIC COMPLEX) 500 MG CAPS Take 500 mg by mouth daily.     No current facility-administered medications on file prior to visit.    Allergies:  No Known Allergies  Physical Exam General: Obese middle-aged male seated, in no evident distress Head: head normocephalic and atraumatic.   Neck: supple with no carotid or supraclavicular bruits Cardiovascular: regular rate and rhythm, no murmurs Musculoskeletal: no deformity Skin:  no rash/petichiae Vascular:  Normal pulses all extremities  Neurologic Exam Mental Status: Awake and fully alert. Oriented to place and time. Recent and remote memory intact. Attention span, concentration and fund of knowledge appropriate. Mood and affect appropriate.  Cranial Nerves: Fundoscopic exam reveals sharp disc margins. Pupils equal, briskly reactive to light. Extraocular movements full without nystagmus. Visual fields full to confrontation. Hearing intact. Facial sensation intact. Face, tongue, palate moves normally and symmetrically.  Motor: Normal bulk and tone. Normal strength in all tested extremity muscles.  Fine finger movements are diminished on the left. Sensory.: intact to touch , pinprick , position and vibratory sensation.  Coordination: Rapid alternating movements normal in all extremities. Finger-to-nose and heel-to-shin performed accurately bilaterally. Gait and Station: Arises from chair without difficulty. Stance is normal. Gait demonstrates normal stride length and balance . Able to heel, toe and tandem walk without difficulty.  Reflexes: 1+ and symmetric. Toes downgoing.   NIHSS 0 Modified Rankin 1   ASSESSMENT: 50 year old male with left frontal MCA branch infarct likely of embolic etiology from his cardiomyopathy and low ejection fraction.  Vascular risk factors of  cardiomyopathy hyperlipidemia and hypertension     PLAN: I had a long d/w patient about his recent cryptogenic stroke, risk for recurrent stroke/TIAs, personally independently reviewed imaging studies and stroke evaluation results and answered questions.Continue aspirin 81 mg daily  for secondary stroke prevention and maintain strict control of hypertension with blood pressure goal below 130/90, diabetes with hemoglobin A1c goal below 6.5% and lipids with LDL cholesterol goal below 70 mg/dL. I also advised the patient to eat a healthy diet with plenty of whole grains, cereals, fruits and vegetables, exercise regularly and maintain ideal body weight . Check polysomnogram for sleep apnoea.. Add C q10 200mg  daily to reduce statin myalgias and increase Lipitor dose to 80 mg daily.  If he is unable to tolerate this we will consider Repatha injections in the future.  I have advised him to follow-up more closely with primary care physician for better blood pressure control.  Greater than 50% time during this 50-minute consultation visit was spent on counseling and coordination of care about her cryptogenic stroke and discussion about stroke prevention, treatment and answering questions.  Continue outpatient cardiology follow-up for his low ejection fraction and cardiomyopathy followup in the future with my nurse practitioner Janett Billow in 3 months or call earlier if necessary. Antony Contras, MD  [facility] [address] [place], [place] [address]  Phone [phone] Fax [phone] Note: This document was prepared with digital dictation and possible smart phrase technology. Any transcriptional errors that result from this process are unintentional.

## 2019-07-13 NOTE — Patient Instructions (Signed)
I had a long d/w patient about his recent cryptogenic stroke, risk for recurrent stroke/TIAs, personally independently reviewed imaging studies and stroke evaluation results and answered questions.Continue aspirin 81 mg daily  for secondary stroke prevention and maintain strict control of hypertension with blood pressure goal below 130/90, diabetes with hemoglobin A1c goal below 6.5% and lipids with LDL cholesterol goal below 70 mg/dL. I also advised the patient to eat a healthy diet with plenty of whole grains, cereals, fruits and vegetables, exercise regularly and maintain ideal body weight . Check polysomnogram for sleep apnoea.. Add C q10 200mg  daily to reduce statin myalgias and increase Lipitor dose to 80 mg daily.  If he is unable to tolerate this we will consider Repatha injections in the future.  I have advised him to follow-up more closely with primary care physician for better blood pressure control.  Followup in the future with my nurse practitioner Janett Billow in 3 months or call earlier if necessary.  Stroke Prevention Some medical conditions and behaviors are associated with a higher chance of having a stroke. You can help prevent a stroke by making nutrition, lifestyle, and other changes, including managing any medical conditions you may have. What nutrition changes can be made?   Eat healthy foods. You can do this by: ? Choosing foods high in fiber, such as fresh fruits and vegetables and whole grains. ? Eating at least 5 or more servings of fruits and vegetables a day. Try to fill half of your plate at each meal with fruits and vegetables. ? Choosing lean protein foods, such as lean cuts of meat, poultry without skin, fish, tofu, beans, and nuts. ? Eating low-fat dairy products. ? Avoiding foods that are high in salt (sodium). This can help lower blood pressure. ? Avoiding foods that have saturated fat, trans fat, and cholesterol. This can help prevent high cholesterol. ? Avoiding  processed and premade foods.  Follow your health care provider's specific guidelines for losing weight, controlling high blood pressure (hypertension), lowering high cholesterol, and managing diabetes. These may include: ? Reducing your daily calorie intake. ? Limiting your daily sodium intake to 1,500 milligrams (mg). ? Using only healthy fats for cooking, such as olive oil, canola oil, or sunflower oil. ? Counting your daily carbohydrate intake. What lifestyle changes can be made?  Maintain a healthy weight. Talk to your health care provider about your ideal weight.  Get at least 30 minutes of moderate physical activity at least 5 days a week. Moderate activity includes brisk walking, biking, and swimming.  Do not use any products that contain nicotine or tobacco, such as cigarettes and e-cigarettes. If you need help quitting, ask your health care provider. It may also be helpful to avoid exposure to secondhand smoke.  Limit alcohol intake to no more than 1 drink a day for nonpregnant women and 2 drinks a day for men. One drink equals 12 oz of beer, 5 oz of wine, or 1 oz of hard liquor.  Stop any illegal drug use.  Avoid taking birth control pills. Talk to your health care provider about the risks of taking birth control pills if: ? You are over 31 years old. ? You smoke. ? You get migraines. ? You have ever had a blood clot. What other changes can be made?  Manage your cholesterol levels. ? Eating a healthy diet is important for preventing high cholesterol. If cholesterol cannot be managed through diet alone, you may also need to take medicines. ? Take any prescribed  medicines to control your cholesterol as told by your health care provider.  Manage your diabetes. ? Eating a healthy diet and exercising regularly are important parts of managing your blood sugar. If your blood sugar cannot be managed through diet and exercise, you may need to take medicines. ? Take any prescribed  medicines to control your diabetes as told by your health care provider.  Control your hypertension. ? To reduce your risk of stroke, try to keep your blood pressure below 130/80. ? Eating a healthy diet and exercising regularly are an important part of controlling your blood pressure. If your blood pressure cannot be managed through diet and exercise, you may need to take medicines. ? Take any prescribed medicines to control hypertension as told by your health care provider. ? Ask your health care provider if you should monitor your blood pressure at home. ? Have your blood pressure checked every year, even if your blood pressure is normal. Blood pressure increases with age and some medical conditions.  Get evaluated for sleep disorders (sleep apnea). Talk to your health care provider about getting a sleep evaluation if you snore a lot or have excessive sleepiness.  Take over-the-counter and prescription medicines only as told by your health care provider. Aspirin or blood thinners (antiplatelets or anticoagulants) may be recommended to reduce your risk of forming blood clots that can lead to stroke.  Make sure that any other medical conditions you have, such as atrial fibrillation or atherosclerosis, are managed. What are the warning signs of a stroke? The warning signs of a stroke can be easily remembered as BEFAST.  B is for balance. Signs include: ? Dizziness. ? Loss of balance or coordination. ? Sudden trouble walking.  E is for eyes. Signs include: ? A sudden change in vision. ? Trouble seeing.  F is for face. Signs include: ? Sudden weakness or numbness of the face. ? The face or eyelid drooping to one side.  A is for arms. Signs include: ? Sudden weakness or numbness of the arm, usually on one side of the body.  S is for speech. Signs include: ? Trouble speaking (aphasia). ? Trouble understanding.  T is for time. ? These symptoms may represent a serious problem that is  an emergency. Do not wait to see if the symptoms will go away. Get medical help right away. Call your local emergency services (911 in the [place] Do not drive yourself to the hospital.  Other signs of stroke may include: ? A sudden, severe headache with no known cause. ? Nausea or vomiting. ? Seizure. Where to find more information For more information, visit:  American Stroke Association: www.strokeassociation.org  National Stroke Association: www.stroke.org Summary  You can prevent a stroke by eating healthy, exercising, not smoking, limiting alcohol intake, and managing any medical conditions you may have.  Do not use any products that contain nicotine or tobacco, such as cigarettes and e-cigarettes. If you need help quitting, ask your health care provider. It may also be helpful to avoid exposure to secondhand smoke.  Remember BEFAST for warning signs of stroke. Get help right away if you or a loved one has any of these signs. This information is not intended to replace advice given to you by your health care provider. Make sure you discuss any questions you have with your health care provider. Document Revised: 05/03/2017 Document Reviewed: 06/26/2016 Elsevier Patient Education  2020 [employer].

## 2019-07-14 ENCOUNTER — Ambulatory Visit: Payer: Self-pay | Admitting: *Deleted

## 2019-07-14 ENCOUNTER — Telehealth: Payer: Self-pay

## 2019-07-14 ENCOUNTER — Other Ambulatory Visit: Payer: Self-pay

## 2019-07-14 DIAGNOSIS — E876 Hypokalemia: Secondary | ICD-10-CM

## 2019-07-14 MED ORDER — POTASSIUM CHLORIDE ER 20 MEQ PO TBCR: 10.0000 meq | EXTENDED_RELEASE_TABLET | Freq: Every day | ORAL | 3 refills | Status: DC

## 2019-07-14 MED ORDER — POTASSIUM CHLORIDE ER 20 MEQ PO TBCR: 20.0000 meq | EXTENDED_RELEASE_TABLET | Freq: Every day | ORAL | 3 refills | Status: DC

## 2019-07-14 NOTE — Telephone Encounter (Signed)
lpmtcb 2/9 labs

## 2019-07-14 NOTE — Telephone Encounter (Signed)
The patient has been notified of the lab results and verbalized understanding.  All questions (if any) were answered. Frederik Schmidt, RN 07/14/2019 8:21 AM    The patient will start Potassium 20 meq Daily and come in for labs on 2/16 for BMET.

## 2019-07-14 NOTE — Progress Notes (Unsigned)
Done

## 2019-07-15 ENCOUNTER — Other Ambulatory Visit: Payer: Self-pay | Admitting: *Deleted

## 2019-07-15 NOTE — Patient Outreach (Addendum)
Richard [name]) Care Management  07/15/2019  Richard [name] 1970/07/26 [id number]   Subjective: Telephone call to patient's home / mobile number, spoke with patient, and HIPAA verified.  Patient states he is doing ok, has seen some improvement in blood pressure, still running high, reading today was 150/86, no high blood pressure related symptoms, and headaches.   Patient states he is aware of signs/ symptoms to report, how to reach provider if needed after hours, when to go to ED, and / or call 911.   States he has been working a lot, has not had a chance to follow up with employer or insurance company to see if there is a hypertension disease management program available, and still planning to follow up in the future.  States he is also continuing to follow up with primary MD regarding blood pressure control.  States had follow up appointments with neurologist and cardiologist, appointments went ok.  Patient states he does not have any education material, EMMI follow up, care coordination, care management, disease monitoring, transportation, or pharmacy needs at this time.States he is very appreciative of the follow up and is in agreement to completion 30 day EMMI Stroke follow up program.  States he will continue to follow with providers as needed.    Objective:Per KPN (Knowledge Performance Now, point of care tool) and chart review,patient hospitalized 05/19/2019 - 05/22/2019 forAcute ischemic stroke. Patient also has a history of prediabetes, diabetes, Chronic combined systolic and diastoliccongestive heart failure, hypertension, and chronic kidney disease stage 3a.    Assessment: Received [employer] EMMI Stroke Google Alert follow up referral on 06/08/2019. Red Flag Alert Trigger, Day # 13, patient answered no to the following question: Went to follow-up appointment?EMMI follow up completed, EMMI Stroke 30 day program completed, and will proceed  with case closure.    Plan: RNCM will complete case closure due to follow up completed / no care management needs.         Labrea Eccleston H. Annia Friendly, BSN, [place] [facility] Telephonic CM Phone: [phone] Fax: [phone]

## 2019-07-20 ENCOUNTER — Other Ambulatory Visit: Payer: Self-pay | Admitting: Student

## 2019-07-20 MED ORDER — AMLODIPINE BESYLATE 2.5 MG PO TABS: 2.5000 mg | ORAL_TABLET | Freq: Every day | ORAL | 11 refills | Status: DC

## 2019-07-20 NOTE — Telephone Encounter (Signed)
Pt's medication was sent to pt's pharmacy as requested. Confirmation received.  °

## 2019-07-21 ENCOUNTER — Ambulatory Visit (INDEPENDENT_AMBULATORY_CARE_PROVIDER_SITE_OTHER): Payer: 59 | Admitting: Neurology

## 2019-07-21 ENCOUNTER — Other Ambulatory Visit: Payer: Self-pay

## 2019-07-21 ENCOUNTER — Encounter: Payer: Self-pay | Admitting: Neurology

## 2019-07-21 ENCOUNTER — Other Ambulatory Visit: Payer: 59

## 2019-07-21 VITALS — BP 174/106 | HR 66 | Temp 97.3°F | Ht 71.0 in | Wt 276.0 lb

## 2019-07-21 DIAGNOSIS — E669 Obesity, unspecified: Secondary | ICD-10-CM | POA: Diagnosis not present

## 2019-07-21 DIAGNOSIS — I639 Cerebral infarction, unspecified: Secondary | ICD-10-CM | POA: Diagnosis not present

## 2019-07-21 DIAGNOSIS — R351 Nocturia: Secondary | ICD-10-CM

## 2019-07-21 DIAGNOSIS — R0683 Snoring: Secondary | ICD-10-CM

## 2019-07-21 DIAGNOSIS — E876 Hypokalemia: Secondary | ICD-10-CM

## 2019-07-21 DIAGNOSIS — I5042 Chronic combined systolic (congestive) and diastolic (congestive) heart failure: Secondary | ICD-10-CM | POA: Diagnosis not present

## 2019-07-21 NOTE — Patient Instructions (Signed)

## 2019-07-21 NOTE — Progress Notes (Signed)
Subjective:    Patient ID: Richard [name] is a 49 y.o. male.  HPI     Star Age, MD, PhD [facility] [address], [address]. [place], [place] [address]  Dear Richard [name],   I saw your patient, Richard [name], upon your kind request, in my sleep clinic today, for initial consultation of his sleep disorder, in particular, concern for underlying obstructive sleep apnea. The patient is unaccompanied today. As you know, Richard [name] is a 49 year old right-handed gentleman with an underlying medical history of hypertension, hyperlipidemia, diabetes, chronic kidney disease, allergies, congestive heart failure, stroke, and obesity, who reports snoring.  I reviewed your office note from 07/13/2019.  His Epworth sleepiness score is 3 out of 24, fatigue severity score is 25 out of 63.  He is single, lives alone, no kids, works as an Sales promotion account executive.  His bedtime is around 8 PM, rise time between 6 and 7 AM.  He has nocturia about twice per average night, sometimes 3 times per night, denies any nocturnal or morning headaches, is not aware of any family history of OSA.  He had gained weight in the context of the pandemic, he is working on weight loss.  He is a non-smoker and drinks alcohol occasionally, no daily caffeine.  He has a TV in his bedroom, puts it on a timer at night, he has no pets in the household.  His Past Medical History Is Significant For: Past Medical History:  Diagnosis Date  . Acute decompensated heart failure (Richard [name]) 01/03/2013  . Allergy   . CHF (congestive heart failure) ([facility])    systolic  . Chronic kidney disease   . CKD (chronic kidney disease) stage 2, GFR 60-89 ml/min 01/04/2013  . DM (diabetes mellitus) ([place]) 01/06/2013  . Hypertension    hx htn emergency  . Obesity   . Stroke [facility])     His Past Surgical History Is Significant For: Past Surgical History:  Procedure Laterality Date  . BUBBLE STUDY  05/22/2019   Procedure: BUBBLE STUDY;  Surgeon:  Richard Latch, MD;  Location: [place];  Service: Cardiovascular;;  . LEFT AND RIGHT HEART CATHETERIZATION WITH CORONARY ANGIOGRAM N/A 01/07/2013   Procedure: LEFT AND RIGHT HEART CATHETERIZATION WITH CORONARY ANGIOGRAM;  Surgeon: Richard Riddle, MD;  Location: [place] CATH LAB;  Service: Cardiovascular;  Laterality: N/A;  . TEE WITHOUT CARDIOVERSION N/A 05/22/2019   Procedure: TRANSESOPHAGEAL ECHOCARDIOGRAM (TEE);  Surgeon: Richard Latch, MD;  [facility] ENDOSCOPY;  Service: Cardiovascular;  Laterality: N/A;    His Family History Is Significant For: Family History  Problem Relation Age of Onset  . Heart disease Father 76       MI  . Hypertension Father   . Hyperlipidemia Father   . Diabetes Mother   . Hypertension Mother     His Social History Is Significant For: Social History   Socioeconomic History  . Marital status: Single    Spouse name: Not on file  . Number of children: Not on file  . Years of education: Not on file  . Highest education level: Not on file  Occupational History  . Not on file  Tobacco Use  . Smoking status: Never Smoker  . Smokeless tobacco: Never Used  Substance and Sexual Activity  . Alcohol use: No  . Drug use: No  . Sexual activity: Not on file  Other Topics Concern  . Not on file  Social History Narrative   Work or School: Engineer, agricultural,  homeless Chief of Staff      Home Situation: lives alone      Spiritual Beliefs: Christian      Lifestyle: 15 minutes dialy walking - working up; working on diet            Social Determinants of Radio broadcast assistant Strain: Low Risk   . Difficulty of Paying Living Expenses: Not hard at all  Food Insecurity: No Food Insecurity  . Worried About Charity fundraiser in the Last Year: Never true  . Ran Out of Food in the Last Year: Never true  Transportation Needs: No Transportation Needs  . Lack of Transportation (Medical): No  . Lack of Transportation (Non-Medical): No   Physical Activity: Sufficiently Active  . Days of Exercise per Week: 5 days  . Minutes of Exercise per Session: 30 min  Stress: Stress Concern Present  . Feeling of Stress : Rather much  Social Connections: Somewhat Isolated  . Frequency of Communication with Friends and Family: Three times a week  . Frequency of Social Gatherings with Friends and Family: Three times a week  . Attends Religious Services: 1 to 4 times per year  . Active Member of Clubs or Organizations: No  . Attends Archivist Meetings: Never  . Marital Status: Never married    His Allergies Are:  No Known Allergies:   His Current Medications Are:  Outpatient Encounter Medications as of 07/21/2019  Medication Sig  . amLODipine (NORVASC) 2.5 MG tablet Take 1 tablet (2.5 mg total) by mouth daily.  Marland Kitchen aspirin EC 81 MG EC tablet Take 1 tablet (81 mg total) by mouth daily.  Marland Kitchen atorvastatin (LIPITOR) 40 MG tablet Take 1 tablet (40 mg total) by mouth daily.  . carvedilol (COREG) 12.5 MG tablet Take 1 tablet (12.5 mg total) by mouth 2 (two) times daily with a meal.  . cholecalciferol (VITAMIN D3) 25 MCG (1000 UT) tablet Take 1,000 Units by mouth daily.  Marland Kitchen co-enzyme Q-10 30 MG capsule Take 7 capsules (210 mg total) by mouth daily.  Marland Kitchen losartan (COZAAR) 25 MG tablet Take 1 tablet (25 mg total) by mouth daily.  . Multiple Vitamin (MULTIVITAMIN WITH MINERALS) TABS tablet Take 1 tablet by mouth daily.  . Omega-3 Fatty Acids (FISH OIL) 1000 MG CAPS Take 1 capsule by mouth daily.  . potassium chloride 20 MEQ TBCR Take 20 mEq by mouth daily.  . Turmeric (QC TUMERIC COMPLEX) 500 MG CAPS Take 500 mg by mouth daily.   No facility-administered encounter medications on file as of 07/21/2019.  :  Review of Systems:  Out of a complete 14 point review of systems, all are reviewed and negative with the exception of these symptoms as listed below: Review of Systems  Neurological:       Here for sleep consult. No prior sleep  study.  Epworth Sleepiness Scale 0= would never doze 1= slight chance of dozing 2= moderate chance of dozing 3= high chance of dozing  Sitting and reading:0 Watching TV:1 Sitting inactive in a public place (ex. Theater or meeting):0 As a passenger in a car for an hour without a break:0 Lying down to rest in the afternoon:2 Sitting and talking to someone:0 Sitting quietly after lunch (no alcohol):0 In a car, while stopped in traffic:0 Total:3     Objective:  Neurological Exam  Physical Exam Physical Examination:   Vitals:   07/21/19 1319  BP: (!) 174/106  Pulse: 66  Temp: (!) 97.3 F (36.3  C)  SpO2: 91%    General Examination: The patient is a very pleasant 49 y.o. male in no acute distress. He appears well-developed and well-nourished and well groomed.   HEENT: Normocephalic, atraumatic, pupils are equal, round and reactive to light, extraocular tracking is good without limitation to gaze excursion or nystagmus noted. Hearing is grossly intact. Face is symmetric with normal facial animation. Speech is clear with no dysarthria noted. There is no hypophonia. There is no lip, neck/head, jaw or voice tremor. Neck is supple with full range of passive and active motion. There are no carotid bruits on auscultation. Oropharynx exam reveals: mild mouth dryness, adequate dental hygiene and moderate airway crowding, due to Small airway entry, tonsillar size of 1+, longer tongue, Mallampati class III, neck circumference of 18-5/8 inches.  He does not have an overbite.  Tongue protrudes centrally in palate elevates symmetrically.   Chest: Clear to auscultation without wheezing, rhonchi or crackles noted.  Heart: S1+S2+0, regular and normal without murmurs, rubs or gallops noted.   Abdomen: Soft, non-tender and non-distended with normal bowel sounds appreciated on auscultation.  Extremities: There is no pitting edema in the distal lower extremities bilaterally.   Skin: Warm and dry  without trophic changes noted.   Musculoskeletal: exam reveals no obvious joint deformities, tenderness or joint swelling or erythema.   Neurologically:  Mental status: The patient is awake, alert and oriented in all 4 spheres. His immediate and remote memory, attention, language skills and fund of knowledge are appropriate. There is no evidence of aphasia, agnosia, apraxia or anomia. Speech is clear with normal prosody and enunciation. Thought process is linear. Mood is normal and affect is normal.  Cranial nerves II - XII are as described above under HEENT exam.  Motor exam: Normal bulk, strength and tone is noted. There is no tremor, Romberg is negative. Fine motor skills and coordination: grossly intact.  Cerebellar testing: No dysmetria or intention tremor. There is no truncal or gait ataxia.  Sensory exam: intact to light touch in the upper and lower extremities.  Gait, station and balance: He stands easily. No veering to one side is noted. No leaning to one side is noted. Posture is age-appropriate and stance is narrow based. Gait shows normal stride length and normal pace. No problems turning are noted. Tandem walk is unremarkable.                Assessment and Plan:   In summary, Gaje Seckinger is a very pleasant 49 y.o.-year old male with an underlying medical history of hypertension, hyperlipidemia, diabetes, chronic kidney disease, allergies, congestive heart failure, stroke, and obesity, whose history and physical exam concerning for obstructive sleep apnea (OSA). I had a long chat with the patient about my findings and the diagnosis of OSA, its prognosis and treatment options. We talked about medical treatments, surgical interventions and non-pharmacological approaches. I explained in particular the risks and ramifications of untreated moderate to severe OSA, especially with respect to developing cardiovascular disease down the Road, including congestive heart failure, difficult to treat  hypertension, cardiac arrhythmias, or stroke. Even type 2 diabetes has, in part, been linked to untreated OSA. Symptoms of untreated OSA include daytime sleepiness, memory problems, mood irritability and mood disorder such as depression and anxiety, lack of energy, as well as recurrent headaches, especially morning headaches. We talked about trying to maintain a healthy lifestyle in general, as well as the importance of weight control. We also talked about the importance of good sleep  hygiene. I recommended the following at this time: sleep study.   I explained the sleep test procedure to the patient and also outlined possible surgical and non-surgical treatment options of OSA, including the use of a custom-made dental device (which would require a referral to a specialist dentist or oral surgeon), upper airway surgical options, such as traditional UPPP or a novel less invasive surgical option in the form of Inspire hypoglossal nerve stimulation (which would involve a referral to an ENT surgeon). I also explained the CPAP treatment option to the patient, who indicated that he would be willing to try CPAP if the need arises. I explained the importance of being compliant with PAP treatment, not only for insurance purposes but primarily to improve His symptoms, and for the patient's long term health benefit, including to reduce His cardiovascular risks. I answered all his questions today and the patient was in agreement. I plan to see him back after the sleep study is completed and encouraged him to call with any interim questions, concerns, problems or updates.   Thank you very much for allowing me to participate in the care of this nice patient. If I can be of any further assistance to you please do not hesitate to talk to me.   Sincerely,   Star Age, MD, PhD

## 2019-07-22 ENCOUNTER — Other Ambulatory Visit: Payer: Self-pay

## 2019-07-22 ENCOUNTER — Telehealth: Payer: Self-pay

## 2019-07-22 LAB — BASIC METABOLIC PANEL
BUN/Creatinine Ratio: 8 — ABNORMAL LOW (ref 9–20)
BUN: 12 mg/dL (ref 6–24)
CO2: 21 mmol/L (ref 20–29)
Calcium: 9 mg/dL (ref 8.7–10.2)
Chloride: 103 mmol/L (ref 96–106)
Creatinine, Ser: 1.46 mg/dL — ABNORMAL HIGH (ref 0.76–1.27)
GFR calc Af Amer: 64 mL/min/{1.73_m2} (ref >59)
GFR calc non Af Amer: 56 mL/min/{1.73_m2} — ABNORMAL LOW (ref >59)
Glucose: 106 mg/dL — ABNORMAL HIGH (ref 65–99)
Potassium: 3.4 mmol/L — ABNORMAL LOW (ref 3.5–5.2)
Sodium: 143 mmol/L (ref 134–144)

## 2019-07-22 MED ORDER — SPIRONOLACTONE 25 MG PO TABS: 12.5000 mg | ORAL_TABLET | Freq: Every day | ORAL | 3 refills | Status: DC

## 2019-07-22 MED ORDER — POTASSIUM CHLORIDE CRYS ER 20 MEQ PO TBCR: 20.0000 meq | EXTENDED_RELEASE_TABLET | Freq: Two times a day (BID) | ORAL | 3 refills | Status: DC

## 2019-07-22 NOTE — Telephone Encounter (Signed)
The patient has been notified of the lab result and verbalized understanding.  All questions (if any) were answered. Frederik Schmidt, RN 07/22/2019 9:10 AM

## 2019-07-22 NOTE — Telephone Encounter (Signed)
-----   Message from Shirley Friar, PA-C sent at 07/22/2019  8:31 AM EST ----- Can we please have him take an extra 40 meq (2 tablets) of K today (60 meq total), then increase chronic dose to 20 meq BID starting tomorrow.  Will also add spironolactone 12.5 mg daily.  He will need BMET when seen in HF clinic in 2 weeks.  Will add to appt notes.   Legrand Como [address] [place], PA-C  07/22/2019 8:31 AM

## 2019-08-03 ENCOUNTER — Encounter (HOSPITAL_COMMUNITY): Payer: 59

## 2019-08-10 ENCOUNTER — Encounter (HOSPITAL_COMMUNITY): Payer: 59

## 2019-08-27 NOTE — Telephone Encounter (Signed)
07/01/19.Marland Kitchenreceived cancellation notification from [employer].. No Baseline.. Monitor returned 07/31/19

## 2019-09-02 ENCOUNTER — Other Ambulatory Visit: Payer: Self-pay

## 2019-09-02 MED ORDER — POTASSIUM CHLORIDE CRYS ER 20 MEQ PO TBCR: 20.0000 meq | EXTENDED_RELEASE_TABLET | Freq: Two times a day (BID) | ORAL | 3 refills | Status: DC

## 2019-09-02 NOTE — Telephone Encounter (Signed)
Pt's medication was sent to pt's pharmacy as requested. Confirmation received.  °

## 2019-10-13 ENCOUNTER — Ambulatory Visit: Payer: 59 | Admitting: Adult Health

## 2020-01-27 ENCOUNTER — Other Ambulatory Visit: Payer: Self-pay | Admitting: Student

## 2020-02-17 ENCOUNTER — Ambulatory Visit: Payer: 59 | Admitting: Registered Nurse

## 2020-02-23 ENCOUNTER — Ambulatory Visit: Payer: 59 | Admitting: Registered Nurse

## 2020-03-02 ENCOUNTER — Ambulatory Visit: Payer: 59 | Admitting: Student

## 2020-07-20 ENCOUNTER — Other Ambulatory Visit: Payer: Self-pay | Admitting: Student

## 2020-10-27 ENCOUNTER — Telehealth: Payer: Self-pay | Admitting: Cardiology

## 2020-10-27 NOTE — Telephone Encounter (Signed)
Spoke with the patient who sent a message requesting an appointment due to swelling in his feet and ankles. He also reports his blood pressure has been elevated recently with systolic's in the [id number]. He states that he has not increased any sodium in his diet. Patient denies any shortness of breath or chest pain. Patient confirms he is taking sprionolactone, amlodipine, carvedilol, and losartan as prescribed. Advised patient on importance of avoiding salt in his diet and elevating his legs.

## 2020-10-27 NOTE — Telephone Encounter (Signed)
Pt requested an appt via pt schedule for BP issues and ankle swelling. Pt has been scheduled 11/01/20 with Renee to be seen in regards to it. Please advise.

## 2020-10-31 NOTE — Progress Notes (Signed)
Cardiology Office Note Date:  10/31/2020  Patient ID:  Richard [name], DOB 16-May-1971, MRN [id number] PCP:  Maximiano Coss, NP  Cardiologist: Dr. Aundra Dubin (last 2018) Electrophysiologist: Dr. Curt Bears    Chief Complaint:  ankle swelling  History of Present Illness: Richard [name] is a 50 y.o. male with history of NICM, chronic CHF (combined) HTN, DM, CKD (III), Dec 2020 stroke  Oddly he had a TTE and TEE during his stroke hospitalization with markedly different reads. 05/20/2019: TTE w/LVEF 50-55% 05/22/2019: TEE w/LVEF 20-25%  He comes in today to be seen for Dr. Curt Bears.  Most recently seen for EP by A. [place], [place] Feb 2021 to f/u on monitoring post stroke. He had not worn the event monitor (unclear, reported he did not get it or notice it came), pt mentioned frustration of labile BPs though also admitted non-compliance with  meds especially leading to his stroke admission. He did not want a loop implant Planned to go home and look for the monitor and have PRN EP follow up pending monitor findings. HF meds adjusted with recs to resume care with the HF team.  He has not seen HF team or cards of any kind since this visit. No monitor result found  TODAY He reports a few weeks of steadily increasing swelling legs and more recently abdominal boating as well, not overtly SOB. No CP, palpitations No near fainting or fainting. He tells me he is taking his medicines every day as written and has been good about them  He reports he never received the event monitor. Back at the time he saw Jonni Sanger Feb 2021, he was a bit overwhelmed with numerous MD visits and appointments and did not follow up with the HF clinic. He would like to try and keep things in this office if able.  He gets BPs at home 150's-160/90's range, never as high as here, and a low number is 120's/80's He has not had any recurrent stroke symptoms, denies any headaches, blurred vision.  But says when he has so much  fluid/swelling he tends to feel fatigued.  He is observed to have an unusual movement of his mouth, he reports this is his baseline, "like a nervous movement"  I"I do that"   Past Medical History:  Diagnosis Date  . Acute decompensated heart failure ([place]) 01/03/2013  . Allergy   . CHF (congestive heart failure) ([facility])    systolic  . Chronic kidney disease   . CKD (chronic kidney disease) stage 2, GFR 60-89 ml/min 01/04/2013  . DM (diabetes mellitus) ([place]) 01/06/2013  . Hypertension    hx htn emergency  . Obesity   . Stroke [facility])     Past Surgical History:  Procedure Laterality Date  . BUBBLE STUDY  05/22/2019   Procedure: BUBBLE STUDY;  Surgeon: Skeet Latch, MD;  Location: [facility];  Service: Cardiovascular;;  . LEFT AND RIGHT HEART CATHETERIZATION WITH CORONARY ANGIOGRAM N/A 01/07/2013   Procedure: LEFT AND RIGHT HEART CATHETERIZATION WITH CORONARY ANGIOGRAM;  Surgeon: Birdie Riddle, MD;  Location: [place] [facility] CATH LAB;  Service: Cardiovascular;  Laterality: N/A;  . TEE WITHOUT CARDIOVERSION N/A 05/22/2019   Procedure: TRANSESOPHAGEAL ECHOCARDIOGRAM (TEE);  Surgeon: Skeet Latch, MD;  Location: [facility] ENDOSCOPY;  Service: Cardiovascular;  Laterality: N/A;    Current Outpatient Medications  Medication Sig Dispense Refill  . amLODipine (NORVASC) 2.5 MG tablet Take 1 tablet (2.5 mg total) by mouth daily. 30 tablet 11  . aspirin EC 81 MG EC tablet Take 1 tablet (81  mg total) by mouth daily. 30 tablet 2  . atorvastatin (LIPITOR) 40 MG tablet Take 1 tablet (40 mg total) by mouth daily. 90 tablet 0  . carvedilol (COREG) 12.5 MG tablet Take 1 tablet (12.5 mg total) by mouth 2 (two) times daily with a meal. 180 tablet 3  . cholecalciferol (VITAMIN D3) 25 MCG (1000 UT) tablet Take 1,000 Units by mouth daily.    Marland Kitchen co-enzyme Q-10 30 MG capsule Take 7 capsules (210 mg total) by mouth daily. 1 capsule 1  . losartan (COZAAR) 25 MG tablet Take 1 tablet (25 mg total) by mouth daily. 90 tablet 3  .  Multiple Vitamin (MULTIVITAMIN WITH MINERALS) TABS tablet Take 1 tablet by mouth daily.    . Omega-3 Fatty Acids (FISH OIL) 1000 MG CAPS Take 1 capsule by mouth daily.    . potassium chloride SA (KLOR-CON) 20 MEQ tablet Take 1 tablet (20 mEq total) by mouth 2 (two) times daily. 90 tablet 3  . spironolactone (ALDACTONE) 25 MG tablet Take 0.5 tablets (12.5 mg total) by mouth daily. 90 tablet 3  . Turmeric (QC TUMERIC COMPLEX) 500 MG CAPS Take 500 mg by mouth daily.     No current facility-administered medications for this visit.    Allergies:   Patient has no known allergies.   Social History:  The patient  reports that he has never smoked. He has never used smokeless tobacco. He reports that he does not drink alcohol and does not use drugs.   Family History:  The patient's family history includes Diabetes in his mother; Heart disease (age of onset: 72) in his father; Hyperlipidemia in his father; Hypertension in his father and mother.  ROS:  Please see the history of present illness.    All other systems are reviewed and otherwise negative.   PHYSICAL EXAM:  VS:  There were no vitals taken for this visit. BMI: There is no height or weight on file to calculate BMI. Well nourished, well developed, in no acute distress HEENT: normocephalic, atraumatic Neck: no JVD, carotid bruits or masses Cardiac:  RRR; no significant murmurs, no rubs, or gallops Lungs:  CTA b/l, no wheezing, rhonchi or rales Abd: soft, nontender MS: no deformity or atrophy Ext: tense edema to just below the knee b/l, no wounds Skin: warm and dry, no rash Neuro:  No gross deficits appreciated Psych: euthymic mood, full affect   EKG:  Done today and reviewed by myself shows  SR 92bpm, similar T changes lat, no acute looking changes  05/22/2019: TEE IMPRESSIONS  1. Left ventricular ejection fraction, by visual estimation, is 20 to  25%. The left ventricle has severely decreased function. There is no left   ventricular hypertrophy.  2. The left ventricle demonstrates global hypokinesis.  3. Global right ventricle has moderately reduced systolic function.The  right ventricular size is normal. No increase in right ventricular wall  thickness.  4. Left atrial size was mildly dilated.  5. Right atrial size was normal.  6. The mitral valve is normal in structure. Trivial mitral valve  regurgitation. No evidence of mitral stenosis.  7. The tricuspid valve is normal in structure. Tricuspid valve  regurgitation is mild.  8. The aortic valve is normal in structure. Aortic valve regurgitation is  not visualized. No evidence of aortic valve sclerosis or stenosis.  9. The pulmonic valve was normal in structure. Pulmonic valve  regurgitation is trivial.  10. The inferior vena cava is normal in size with greater than 50%  respiratory variability, suggesting right atrial pressure of 3 mmHg.    05/20/2019: TTE IMPRESSIONS  1. Left ventricular ejection fraction, by visual estimation, is 50 to  55%. The left ventricle has low normal function. There is moderately  increased left ventricular hypertrophy.  2. Indeterminate diastolic filling due to E-A fusion.  3. The left ventricle has no regional wall motion abnormalities.  4. Global right ventricle has low normal systolic function.The right  ventricular size is normal. No increase in right ventricular wall  thickness.  5. Left atrial size was mildly dilated.  6. Right atrial size was mildly dilated.  7. Trivial pericardial effusion is present.  8. The mitral valve is grossly normal. Mild mitral valve regurgitation.  9. The tricuspid valve is grossly normal. Tricuspid valve regurgitation  is mild.  10. The aortic valve is tricuspid. Aortic valve regurgitation is not  visualized. Mild aortic valve sclerosis without stenosis.  11. The pulmonic valve was grossly normal. Pulmonic valve regurgitation is  not visualized.  12. Mildly  elevated pulmonary artery systolic pressure.  13. The tricuspid regurgitant velocity is 3.06 m/s, and with an assumed  right atrial pressure of 3 mmHg, the estimated right ventricular systolic  pressure is mildly elevated at 40.5 mmHg.  14. The inferior vena cava is normal in size with greater than 50%  respiratory variability, suggesting right atrial pressure of 3 mmHg.  15. A prior study was performed on 03/15/2016.  16. No significant change from prior study.    Oct 2017: TTE w/LVEF 50-55%  Recent Labs: No results found for requested labs within last 8760 hours.  No results found for requested labs within last 8760 hours.   CrCl cannot be calculated (Patient's most recent lab result is older than the maximum 21 days allowed.).   Wt Readings from Last 3 Encounters:  07/21/19 276 lb (125.2 kg)  07/13/19 278 lb (126.1 kg)  07/08/19 274 lb 3.2 oz (124.4 kg)     Other studies reviewed: Additional studies/records reviewed today include: summarized above  ASSESSMENT AND PLAN:  1. Cryptogenic stroke     Declined loop     Never got the event monitor       2. NICM     Non-compliance with meds and follow up     Marked volume OL     He reports about 20+ pounds in the last 3 weeks or so and his dry weight towards 270, maybe a little less       3. HTN     OOC, a manual BP by myself is 190/120   In d/w Dr. Curt Bears Start lasix '40mg'$  BID for 4 days then daily Increase losartan to '50mg'$  daily  Increase coreg to '25mg'$  BID BMET today Establish with general cardiology team here, will refer to Dr. Gasper Sells (he has an available appointment next week) will plan for repeat chem then as well  I have asked the patient to monitor his BP closely at home and weigh every morning Notify if BP getting low or if he has any escalating symptoms to seek attention at the hospital if needed  I think EP can see him PRN.  Will defer to Dr. Gasper Sells plans for updating his echo, revisit event  monitoring next week.  Disposition: as above  Current medicines are reviewed at length with the patient today.  The patient did not have any concerns regarding medicines.  Venetia Night, PA-C 10/31/2020 11:08 AM     [facility] [address]  [place] [address] [phone] (office)  [phone] (fax)

## 2020-11-01 ENCOUNTER — Ambulatory Visit (INDEPENDENT_AMBULATORY_CARE_PROVIDER_SITE_OTHER): Payer: 59 | Admitting: Physician Assistant

## 2020-11-01 ENCOUNTER — Encounter: Payer: Self-pay | Admitting: Physician Assistant

## 2020-11-01 ENCOUNTER — Other Ambulatory Visit: Payer: Self-pay

## 2020-11-01 VITALS — BP 168/132 | HR 92 | Ht 71.0 in | Wt 295.4 lb

## 2020-11-01 DIAGNOSIS — I428 Other cardiomyopathies: Secondary | ICD-10-CM | POA: Diagnosis not present

## 2020-11-01 DIAGNOSIS — I10 Essential (primary) hypertension: Secondary | ICD-10-CM | POA: Diagnosis not present

## 2020-11-01 DIAGNOSIS — Z79899 Other long term (current) drug therapy: Secondary | ICD-10-CM | POA: Diagnosis not present

## 2020-11-01 DIAGNOSIS — I5043 Acute on chronic combined systolic (congestive) and diastolic (congestive) heart failure: Secondary | ICD-10-CM | POA: Diagnosis not present

## 2020-11-01 MED ORDER — POTASSIUM CHLORIDE CRYS ER 20 MEQ PO TBCR: 20.0000 meq | EXTENDED_RELEASE_TABLET | Freq: Two times a day (BID) | ORAL | 1 refills | Status: DC

## 2020-11-01 MED ORDER — CARVEDILOL 25 MG PO TABS: 25.0000 mg | ORAL_TABLET | Freq: Two times a day (BID) | ORAL | 1 refills | Status: DC

## 2020-11-01 MED ORDER — SPIRONOLACTONE 25 MG PO TABS: 12.5000 mg | ORAL_TABLET | Freq: Every day | ORAL | 1 refills | Status: DC

## 2020-11-01 MED ORDER — FUROSEMIDE 40 MG PO TABS: 40.0000 mg | ORAL_TABLET | Freq: Every day | ORAL | 1 refills | Status: DC

## 2020-11-01 MED ORDER — LOSARTAN POTASSIUM 50 MG PO TABS: 50.0000 mg | ORAL_TABLET | Freq: Every day | ORAL | 1 refills | Status: DC

## 2020-11-01 MED ORDER — LOSARTAN POTASSIUM 50 MG PO TABS: 25.0000 mg | ORAL_TABLET | Freq: Every day | ORAL | 1 refills | Status: DC

## 2020-11-01 NOTE — Patient Instructions (Addendum)
Medication Instructions:   FOR 4 DAYS ONLY THEN RESUME DAILY:   1. TAKE  FUROSEMIDE (LASIX)  40 MG  TWICE A DAY               THEN    2. TAKE  FUROSEMIDE (LASIX)  40 MG ONCE A DAY   START TAKING COREG: 25 MG TWICE A DAY   START  TAKING COZAAR:  50 MG ONCE A DAY   *If you need a refill on your cardiac medications before your next appointment, please call your pharmacy*  Lab Work:  BMET [place] AS (NURSE VISIT)  If you have labs (blood work) drawn today and your tests are completely normal, you will receive your results only by: Marland Kitchen MyChart Message (if you have MyChart) OR . A paper copy in the mail If you have any lab test that is abnormal or we need to change your treatment, we will call you to review the results.   Testing/Procedures: NONE ORDERED  TODAY   Follow-Up: At [facility], you and your health needs are our priority.  As part of our continuing mission to provide you with exceptional heart care, we have created designated Provider Care Teams.  These Care Teams include your primary Cardiologist (physician) and Advanced Practice Providers (APPs -  Physician Assistants and Nurse Practitioners) who all work together to provide you with the care you need, when you need it.  We recommend signing up for the patient portal called "MyChart".  Sign up information is provided on this After Visit Summary.  MyChart is used to connect with patients for Virtual Visits (Telemedicine).  Patients are able to view lab/test results, encounter notes, upcoming appointments, etc.  Non-urgent messages can be sent to your provider as well.   To learn more about what you can do with MyChart, go to [email or web address].    Your next appointment:   2  week(s)  CONSULT   The format for your next appointment:   In Person  Provider:   Rudean Haskell, MD   ( IF Gasper Sells NOT AVAILABLE WITH [facility] PA-C )  NURSE VISIT Monday FOR BLOOD PRESSURE  CHECK WITH NEW MEDICATION CHANGES   Other Instructions

## 2020-11-02 LAB — BASIC METABOLIC PANEL
BUN/Creatinine Ratio: 10 (ref 9–20)
BUN: 18 mg/dL (ref 6–24)
CO2: 22 mmol/L (ref 20–29)
Calcium: 9.3 mg/dL (ref 8.7–10.2)
Chloride: 105 mmol/L (ref 96–106)
Creatinine, Ser: 1.83 mg/dL — ABNORMAL HIGH (ref 0.76–1.27)
Glucose: 109 mg/dL — ABNORMAL HIGH (ref 65–99)
Potassium: 4.1 mmol/L (ref 3.5–5.2)
Sodium: 144 mmol/L (ref 134–144)
eGFR: 44 mL/min/{1.73_m2} — ABNORMAL LOW (ref >59)

## 2020-11-03 ENCOUNTER — Telehealth: Payer: Self-pay | Admitting: Physician Assistant

## 2020-11-03 NOTE — Telephone Encounter (Signed)
New Message:    Pt saw Renee on 11-01-20. He says he needs a note for work stating that he can not physically go to work in person, but can work at home. He says because of the swelling in his legs, it is hard for him to walk.

## 2020-11-07 NOTE — Progress Notes (Signed)
Cardiology Office Note:    Date:  11/08/2020   ID:  Richard [name], DOB 1970-07-10, MRN [id number]  PCP:  Maximiano Coss, NP   [facility] Providers Cardiologist:  Werner Lean, MD     Referring MD: Maximiano Coss, NP   CC: Establish with gen cards  History of Present Illness:    Richard [name] is a 50 y.o. male with a hx of NICM and HFrEF (EF 20-25%), 2/21 Stroke with prior heart monitoring (loop deferred).  Previous HF Clinc referral (was overwhelmed and did nto make f/u), CKD Stage IIIb, Morbid Obesity, HTN, and DM.  Presents after recent evaluation from EP.  Patient notes that he is doing work.  Notes that he is having some swelling (recently started back on lasix.    No chest pain or pressure.  Notes that his breathing is ok at rest but has some DOE, improved with the lasix.  No PND/orthopnea/bendopnea.  Notes weight loss on lasix.  No palpitations or syncope.  160/135 BP at home.  No Nephrologist.   Past Medical History:  Diagnosis Date  . Acute decompensated heart failure ([place]) 01/03/2013  . Allergy   . CHF (congestive heart failure) (HCC)    systolic  . Chronic kidney disease   . CKD (chronic kidney disease) stage 2, GFR 60-89 ml/min 01/04/2013  . DM (diabetes mellitus) ([place]) 01/06/2013  . Hypertension    hx htn emergency  . Obesity   . Stroke [facility])     Past Surgical History:  Procedure Laterality Date  . BUBBLE STUDY  05/22/2019   Procedure: BUBBLE STUDY;  Surgeon: Skeet Latch, MD;  Location: [place];  Service: Cardiovascular;;  . LEFT AND RIGHT HEART CATHETERIZATION WITH CORONARY ANGIOGRAM N/A 01/07/2013   Procedure: LEFT AND RIGHT HEART CATHETERIZATION WITH CORONARY ANGIOGRAM;  Surgeon: Birdie Riddle, MD;  Location: [place] CATH LAB;  Service: Cardiovascular;  Laterality: N/A;  . TEE WITHOUT CARDIOVERSION N/A 05/22/2019   Procedure: TRANSESOPHAGEAL ECHOCARDIOGRAM (TEE);  Surgeon: Skeet Latch, MD;  Location: [facility] ENDOSCOPY;  Service:  Cardiovascular;  Laterality: N/A;    Current Medications: Current Meds  Medication Sig  . amLODipine (NORVASC) 2.5 MG tablet Take 1 tablet (2.5 mg total) by mouth daily.  Marland Kitchen aspirin EC 81 MG EC tablet Take 1 tablet (81 mg total) by mouth daily.  Marland Kitchen atorvastatin (LIPITOR) 40 MG tablet Take 1 tablet (40 mg total) by mouth daily.  . carvedilol (COREG) 25 MG tablet Take 1 tablet (25 mg total) by mouth 2 (two) times daily with a meal.  . cholecalciferol (VITAMIN D3) 25 MCG (1000 UT) tablet Take 1,000 Units by mouth daily.  Marland Kitchen co-enzyme Q-10 30 MG capsule Take 60 mg by mouth daily. Take 2 caps po daily  . furosemide (LASIX) 40 MG tablet Take 1 tablet (40 mg total) by mouth daily.  . Multiple Vitamin (MULTIVITAMIN WITH MINERALS) TABS tablet Take 1 tablet by mouth daily.  . Omega-3 Fatty Acids (FISH OIL) 1000 MG CAPS Take 1 capsule by mouth daily.  . potassium chloride SA (KLOR-CON) 20 MEQ tablet Take 1 tablet (20 mEq total) by mouth 2 (two) times daily.  . sacubitril-valsartan (ENTRESTO) 24-26 MG Take 1 tablet by mouth 2 (two) times daily.  Marland Kitchen spironolactone (ALDACTONE) 25 MG tablet Take 0.5 tablets (12.5 mg total) by mouth daily.  . Turmeric 500 MG CAPS Take 500 mg by mouth daily.  . [DISCONTINUED] co-enzyme Q-10 30 MG capsule Take 7 capsules (210 mg total) by mouth daily. (Patient taking  differently: Take 60 mg by mouth daily. Take 2 caps po daily)  . [DISCONTINUED] losartan (COZAAR) 50 MG tablet Take 1 tablet (50 mg total) by mouth daily.     Allergies:   Patient has no known allergies.   Social History   Socioeconomic History  . Marital status: Single    Spouse name: Not on file  . Number of children: Not on file  . Years of education: Not on file  . Highest education level: Not on file  Occupational History  . Not on file  Tobacco Use  . Smoking status: Never Smoker  . Smokeless tobacco: Never Used  Substance and Sexual Activity  . Alcohol use: No  . Drug use: No  . Sexual  activity: Not on file  Other Topics Concern  . Not on file  Social History Narrative   Work or School: Engineer, agricultural, Tax inspector      Home Situation: lives alone      Spiritual Beliefs: Christian      Lifestyle: 15 minutes dialy walking - working up; working on diet            Social Determinants of Radio broadcast assistant Strain: Not on file  Food Insecurity: Not on file  Transportation Needs: Not on file  Physical Activity: Not on file  Stress: Not on file  Social Connections: Not on file     Family History: The patient's family history includes Diabetes in his mother; Heart disease (age of onset: 23) in his father; Hyperlipidemia in his father; Hypertension in his father and mother.  ROS:   Please see the history of present illness.     All other systems reviewed and are negative.  EKGs/Labs/Other Studies Reviewed:    The following studies were reviewed today:  EKG:   11/01/20: SR 92 Septal infarct pattern  Transthoracic Echocardiogram: Date: 05/20/2019 Results: 1. Left ventricular ejection fraction, by visual estimation, is 50 to  55%. The left ventricle has low normal function. There is moderately  increased left ventricular hypertrophy.  2. Indeterminate diastolic filling due to E-A fusion.  3. The left ventricle has no regional wall motion abnormalities.  4. Global right ventricle has low normal systolic function.The right  ventricular size is normal. No increase in right ventricular wall  thickness.  5. Left atrial size was mildly dilated.  6. Right atrial size was mildly dilated.  7. Trivial pericardial effusion is present.  8. The mitral valve is grossly normal. Mild mitral valve regurgitation.  9. The tricuspid valve is grossly normal. Tricuspid valve regurgitation  is mild.  10. The aortic valve is tricuspid. Aortic valve regurgitation is not  visualized. Mild aortic valve sclerosis without stenosis.  11. The  pulmonic valve was grossly normal. Pulmonic valve regurgitation is  not visualized.  12. Mildly elevated pulmonary artery systolic pressure.  13. The tricuspid regurgitant velocity is 3.06 m/s, and with an assumed  right atrial pressure of 3 mmHg, the estimated right ventricular systolic  pressure is mildly elevated at 40.5 mmHg.  14. The inferior vena cava is normal in size with greater than 50%  respiratory variability, suggesting right atrial pressure of 3 mmHg.  15. A prior study was performed on 03/15/2016.  16. No significant change from prior study.   Transesophageal Echocardiogram: Date: 05/22/2019 Results: 1. Left ventricular ejection fraction, by visual estimation, is 20 to  25%. The left ventricle has severely decreased function. There is no left  ventricular hypertrophy.  2. The left ventricle demonstrates global hypokinesis.  3. Global right ventricle has moderately reduced systolic function.The  right ventricular size is normal. No increase in right ventricular wall  thickness.  4. Left atrial size was mildly dilated.  5. Right atrial size was normal.  6. The mitral valve is normal in structure. Trivial mitral valve  regurgitation. No evidence of mitral stenosis.  7. The tricuspid valve is normal in structure. Tricuspid valve  regurgitation is mild.  8. The aortic valve is normal in structure. Aortic valve regurgitation is  not visualized. No evidence of aortic valve sclerosis or stenosis.  9. The pulmonic valve was normal in structure. Pulmonic valve  regurgitation is trivial.  10. The inferior vena cava is normal in size with greater than 50%  respiratory variability, suggesting right atrial pressure of 3 mmHg.    Recent Labs: 11/01/2020: BUN 18; Creatinine, Ser 1.83; Potassium 4.1; Sodium 144  Recent Lipid Panel    Component Value Date/Time   CHOL 149 06/10/2019 1014   TRIG 63 06/10/2019 1014   HDL 43 06/10/2019 1014   CHOLHDL 3.5 06/10/2019 1014    CHOLHDL 5.3 05/20/2019 0800   VLDL 21 05/20/2019 0800   LDLCALC 93 06/10/2019 1014    Risk Assessment/Calculations:     N/A  Physical Exam:    VS:  BP (!) 150/102   Pulse 67   Ht '5\' 11"'$  (1.803 m)   Wt 126.6 kg   SpO2 98%   BMI 38.91 kg/m     Wt Readings from Last 3 Encounters:  11/08/20 126.6 kg  11/01/20 134 kg  07/21/19 125.2 kg     GEN: Obese well developed in no acute distress HEENT: Normal NECK: No JVD LYMPHATICS: No lymphadenopathy CARDIAC: RRR, no murmurs, rubs, gallops RESPIRATORY:  Clear to auscultation without rales, wheezing or rhonchi  ABDOMEN: Soft, non-tender, non-distended but with some dullness to percussion MUSCULOSKELETAL:  +1 edema bilaterally; No deformity  SKIN: Warm and dry NEUROLOGIC:  Alert and oriented x 3 PSYCHIATRIC:  Normal affect   ASSESSMENT:    1. HFrEF (heart failure with reduced ejection fraction) ([place])   2. History of CVA (cerebrovascular accident)   3. Obesity, diabetes, and hypertension syndrome ([place])   4. Stage 3b chronic kidney disease (HCC)    PLAN:    In order of problems listed above:  Heart Failure Recovered Ejection Fraction  History Of Stroke Morbid Obesity, HTN, DM CKD Stage IIIb - NYHA class II, Stage C, hypervolemic, etiology from unclear but non-ischemic per reports - Diuretic regimen: Lasix 40 mg PO Daily - Discussed the importance of fluid restriction of < 2 L, salt restriction, and checking daily weights  - BMP/BNP/Mg for today on new diuretic - Coreg 25 mg PO BID - losartan to Entresto 24/26  - aldactone 25 mg PO daily - TTE ordered  - SGLT2i at subsequent visit - will consider CMR or other lab testing based on results - Nephrology referral  6-8 weeks follow up unless new symptoms or abnormal test results warranting change in plan    Medication Adjustments/Labs and Tests Ordered: Current medicines are reviewed at length with the patient today.  Concerns regarding medicines are outlined above.   Orders Placed This Encounter  Procedures  . Pro b natriuretic peptide (BNP)  . Magnesium  . Ambulatory referral to Internal Medicine  . Ambulatory referral to Nephrology  . ECHOCARDIOGRAM COMPLETE   Meds ordered this encounter  Medications  . sacubitril-valsartan (ENTRESTO) 24-26 MG  Sig: Take 1 tablet by mouth 2 (two) times daily.    Dispense:  180 tablet    Refill:  3    Patient Instructions  Medication Instructions:  Your physician has recommended you make the following change in your medication:  STOP: losartan   START: sacubitril - valsartan (Entresto) 24-26 mg by mouth twice daily START WEDNESDAY  *If you need a refill on your cardiac medications before your next appointment, please call your pharmacy*   Lab Work: TODAY: BNP, BMP, Mg If you have labs (blood work) drawn today and your tests are completely normal, you will receive your results only by: Marland Kitchen MyChart Message (if you have MyChart) OR . A paper copy in the mail If you have any lab test that is abnormal or we need to change your treatment, we will call you to review the results.   Testing/Procedures: Your physician has requested that you have an echocardiogram. Echocardiography is a painless test that uses sound waves to create images of your heart. It provides your doctor with information about the size and shape of your heart and how well your heart's chambers and valves are working. This procedure takes approximately one hour. There are no restrictions for this procedure.     Follow-Up: At [facility], you and your health needs are our priority.  As part of our continuing mission to provide you with exceptional heart care, we have created designated Provider Care Teams.  These Care Teams include your primary Cardiologist (physician) and Advanced Practice Providers (APPs -  Physician Assistants and Nurse Practitioners) who all work together to provide you with the care you need, when you need it.  We  recommend signing up for the patient portal called "MyChart".  Sign up information is provided on this After Visit Summary.  MyChart is used to connect with patients for Virtual Visits (Telemedicine).  Patients are able to view lab/test results, encounter notes, upcoming appointments, etc.  Non-urgent messages can be sent to your provider as well.   To learn more about what you can do with MyChart, go to [email or web address].    Your next appointment:   6-8 week(s)  The format for your next appointment:   In Person  Provider:   You may see Werner Lean, MD or one of the following Advanced Practice Providers on your designated Care Team:    Melina Copa, PA-C  Ermalinda Barrios, PA-C    Other Instructions Your physician has placed referrals for Internal Medicine and Nephrology.  A scheduler will reach out to schedule appointments at a later time.      Signed, Werner Lean, MD  11/08/2020 11:10 AM    [place]

## 2020-11-08 ENCOUNTER — Other Ambulatory Visit: Payer: Self-pay

## 2020-11-08 ENCOUNTER — Other Ambulatory Visit: Payer: 59

## 2020-11-08 ENCOUNTER — Encounter: Payer: Self-pay | Admitting: Internal Medicine

## 2020-11-08 ENCOUNTER — Ambulatory Visit (INDEPENDENT_AMBULATORY_CARE_PROVIDER_SITE_OTHER): Payer: 59 | Admitting: Internal Medicine

## 2020-11-08 VITALS — BP 150/102 | HR 67 | Ht 71.0 in | Wt 279.0 lb

## 2020-11-08 DIAGNOSIS — I5020 Unspecified systolic (congestive) heart failure: Secondary | ICD-10-CM | POA: Insufficient documentation

## 2020-11-08 DIAGNOSIS — N1832 Chronic kidney disease, stage 3b: Secondary | ICD-10-CM | POA: Diagnosis not present

## 2020-11-08 DIAGNOSIS — E119 Type 2 diabetes mellitus without complications: Secondary | ICD-10-CM | POA: Insufficient documentation

## 2020-11-08 DIAGNOSIS — Z79899 Other long term (current) drug therapy: Secondary | ICD-10-CM

## 2020-11-08 DIAGNOSIS — E1159 Type 2 diabetes mellitus with other circulatory complications: Secondary | ICD-10-CM

## 2020-11-08 DIAGNOSIS — E1169 Type 2 diabetes mellitus with other specified complication: Secondary | ICD-10-CM | POA: Diagnosis not present

## 2020-11-08 DIAGNOSIS — Z8673 Personal history of transient ischemic attack (TIA), and cerebral infarction without residual deficits: Secondary | ICD-10-CM | POA: Diagnosis not present

## 2020-11-08 DIAGNOSIS — E669 Obesity, unspecified: Secondary | ICD-10-CM

## 2020-11-08 DIAGNOSIS — I152 Hypertension secondary to endocrine disorders: Secondary | ICD-10-CM | POA: Insufficient documentation

## 2020-11-08 LAB — BASIC METABOLIC PANEL WITH GFR
BUN/Creatinine Ratio: 9 (ref 9–20)
BUN: 18 mg/dL (ref 6–24)
CO2: 25 mmol/L (ref 20–29)
Calcium: 8.4 mg/dL — ABNORMAL LOW (ref 8.7–10.2)
Chloride: 100 mmol/L (ref 96–106)
Creatinine, Ser: 1.96 mg/dL — ABNORMAL HIGH (ref 0.76–1.27)
Glucose: 100 mg/dL — ABNORMAL HIGH (ref 65–99)
Potassium: 3.5 mmol/L (ref 3.5–5.2)
Sodium: 142 mmol/L (ref 134–144)
eGFR: 41 mL/min/1.73 — ABNORMAL LOW

## 2020-11-08 LAB — PRO B NATRIURETIC PEPTIDE: NT-Pro BNP: 3861 pg/mL — ABNORMAL HIGH (ref 0–121)

## 2020-11-08 LAB — MAGNESIUM: Magnesium: 1.6 mg/dL (ref 1.6–2.3)

## 2020-11-08 MED ORDER — SACUBITRIL-VALSARTAN 24-26 MG PO TABS
1.0000 | ORAL_TABLET | Freq: Two times a day (BID) | ORAL | 3 refills | Status: DC
Start: 2020-11-08 — End: 2021-09-06

## 2020-11-08 NOTE — Patient Instructions (Addendum)
Medication Instructions:  Your physician has recommended you make the following change in your medication:  STOP: losartan   START: sacubitril - valsartan (Entresto) 24-26 mg by mouth twice daily START WEDNESDAY  *If you need a refill on your cardiac medications before your next appointment, please call your pharmacy*   Lab Work: TODAY: BNP, BMP, Mg If you have labs (blood work) drawn today and your tests are completely normal, you will receive your results only by: Marland Kitchen MyChart Message (if you have MyChart) OR . A paper copy in the mail If you have any lab test that is abnormal or we need to change your treatment, we will call you to review the results.   Testing/Procedures: Your physician has requested that you have an echocardiogram. Echocardiography is a painless test that uses sound waves to create images of your heart. It provides your doctor with information about the size and shape of your heart and how well your heart's chambers and valves are working. This procedure takes approximately one hour. There are no restrictions for this procedure.     Follow-Up: At [facility], you and your health needs are our priority.  As part of our continuing mission to provide you with exceptional heart care, we have created designated Provider Care Teams.  These Care Teams include your primary Cardiologist (physician) and Advanced Practice Providers (APPs -  Physician Assistants and Nurse Practitioners) who all work together to provide you with the care you need, when you need it.  We recommend signing up for the patient portal called "MyChart".  Sign up information is provided on this After Visit Summary.  MyChart is used to connect with patients for Virtual Visits (Telemedicine).  Patients are able to view lab/test results, encounter notes, upcoming appointments, etc.  Non-urgent messages can be sent to your provider as well.   To learn more about what you can do with MyChart, go to  [email or web address].    Your next appointment:   6-8 week(s)  The format for your next appointment:   In Person  Provider:   You may see Werner Lean, MD or one of the following Advanced Practice Providers on your designated Care Team:    Melina Copa, PA-C  Ermalinda Barrios, PA-C    Other Instructions Your physician has placed referrals for Internal Medicine and Nephrology.  A scheduler will reach out to schedule appointments at a later time.

## 2020-11-08 NOTE — Addendum Note (Signed)
Addended by: Precious Gilding on: 11/08/2020 11:33 AM   Modules accepted: Orders

## 2020-11-10 ENCOUNTER — Telehealth: Payer: Self-pay

## 2020-11-10 DIAGNOSIS — I10 Essential (primary) hypertension: Secondary | ICD-10-CM

## 2020-11-10 DIAGNOSIS — I5020 Unspecified systolic (congestive) heart failure: Secondary | ICD-10-CM

## 2020-11-10 DIAGNOSIS — Z79899 Other long term (current) drug therapy: Secondary | ICD-10-CM

## 2020-11-10 NOTE — Telephone Encounter (Signed)
Called pt notified him of results and MD recommendations.  He is agreeable to plan fu labs scheduled for 11/18/20.  All questions were answered.

## 2020-11-10 NOTE — Telephone Encounter (Signed)
-----   Message from Werner Lean, MD sent at 11/08/2020  7:01 PM EDT ----- Results: Increase in BNP, and creatinine Plan: Follow up BMP in 7-10 days; will needs lab results sent to new nephrologist  Werner Lean, MD

## 2020-11-18 ENCOUNTER — Other Ambulatory Visit: Payer: 59 | Admitting: *Deleted

## 2020-11-18 ENCOUNTER — Other Ambulatory Visit: Payer: Self-pay

## 2020-11-18 DIAGNOSIS — I10 Essential (primary) hypertension: Secondary | ICD-10-CM

## 2020-11-18 DIAGNOSIS — I5020 Unspecified systolic (congestive) heart failure: Secondary | ICD-10-CM

## 2020-11-18 DIAGNOSIS — Z79899 Other long term (current) drug therapy: Secondary | ICD-10-CM

## 2020-11-18 LAB — BASIC METABOLIC PANEL
BUN/Creatinine Ratio: 14 (ref 9–20)
BUN: 22 mg/dL (ref 6–24)
CO2: 23 mmol/L (ref 20–29)
Calcium: 9.4 mg/dL (ref 8.7–10.2)
Chloride: 101 mmol/L (ref 96–106)
Creatinine, Ser: 1.62 mg/dL — ABNORMAL HIGH (ref 0.76–1.27)
Glucose: 111 mg/dL — ABNORMAL HIGH (ref 65–99)
Potassium: 4.2 mmol/L (ref 3.5–5.2)
Sodium: 140 mmol/L (ref 134–144)
eGFR: 51 mL/min/{1.73_m2} — ABNORMAL LOW (ref >59)

## 2020-12-02 ENCOUNTER — Telehealth: Payer: Self-pay | Admitting: Internal Medicine

## 2020-12-02 NOTE — Telephone Encounter (Signed)
Pt c/o medication issue:  1. Name of Medication: sacubitril-valsartan (ENTRESTO) 24-26 MG  2. How are you currently taking this medication (dosage and times per day)? As directed  3. Are you having a reaction (difficulty breathing--STAT)? no  4. What is your medication issue? Cost  Patient wanted to know if there was a way to get this medication at a reduced price

## 2020-12-02 NOTE — Telephone Encounter (Signed)
**Note De-Identified Richard [name] Obfuscation** No answer so I left a message on the pts VM advising him of the [employer]'s program for [place] and I did leave their phone number in the VM message so he can contact them to ask questions about their program and his eligibility to be approved.  I also sent the pt a detailed MYCHART message concerning [employer] Pt as[employer] and what he should do to apply.  See [facility] message (07/01) for details.

## 2020-12-07 ENCOUNTER — Other Ambulatory Visit (HOSPITAL_COMMUNITY): Payer: 59

## 2020-12-07 NOTE — Telephone Encounter (Signed)
Pt brought [employer] application by the office. He did not sign it. I called him and he is coming by the office tomorrow morning to sign.  I have left the application at the front desk.

## 2020-12-08 NOTE — Telephone Encounter (Signed)
Pt brought paperwork for entresto to the office. I gave pt 1 bottle of samples of Entresto 24/26 mg tablet, at the front desk for pt to pick up. Lot# [id number]     Exp: 09/2022

## 2020-12-09 ENCOUNTER — Telehealth: Payer: Self-pay

## 2020-12-09 NOTE — Telephone Encounter (Signed)
**Note De-Identified Sheppard Luckenbach Obfuscation** A [employer] pt asst application for Delene Loll was left at the office for this pt. I have completed the provider page of the application and have emailed all to Dr Oralia Rud nurse so she can obtain his signature, date it and to fax all to [employer] at the fax number written on the cover letter included or to place in the to be faxed box in Medical Records.

## 2020-12-09 NOTE — Telephone Encounter (Signed)
Application signed by Dr.Chandrasekhar and faxed to [employer].  Placed in fax box in medical records.

## 2020-12-19 ENCOUNTER — Telehealth (HOSPITAL_COMMUNITY): Payer: Self-pay | Admitting: Radiology

## 2020-12-19 NOTE — Telephone Encounter (Signed)
Patient called to cancel echocardiogram appointment due to lack of insurance. We will remove from the WQ. We will reinstate the order at anytime.

## 2020-12-20 ENCOUNTER — Other Ambulatory Visit (HOSPITAL_COMMUNITY): Payer: Self-pay

## 2020-12-23 ENCOUNTER — Ambulatory Visit: Payer: Self-pay | Admitting: Internal Medicine

## 2021-03-27 ENCOUNTER — Other Ambulatory Visit: Payer: Self-pay

## 2021-03-27 ENCOUNTER — Other Ambulatory Visit: Payer: Self-pay | Admitting: Physician Assistant

## 2021-03-27 MED ORDER — FUROSEMIDE 40 MG PO TABS: 40.0000 mg | ORAL_TABLET | Freq: Every day | ORAL | 0 refills | Status: DC

## 2021-03-28 ENCOUNTER — Other Ambulatory Visit: Payer: Self-pay | Admitting: Physician Assistant

## 2021-03-28 DIAGNOSIS — I10 Essential (primary) hypertension: Secondary | ICD-10-CM

## 2021-03-29 ENCOUNTER — Other Ambulatory Visit: Payer: Self-pay | Admitting: Physician Assistant

## 2021-03-29 ENCOUNTER — Other Ambulatory Visit: Payer: Self-pay

## 2021-03-29 MED ORDER — FUROSEMIDE 40 MG PO TABS: 40.0000 mg | ORAL_TABLET | Freq: Every day | ORAL | 2 refills | Status: DC

## 2021-04-30 ENCOUNTER — Other Ambulatory Visit: Payer: Self-pay | Admitting: Physician Assistant

## 2021-05-02 MED ORDER — POTASSIUM CHLORIDE CRYS ER 20 MEQ PO TBCR: 20.0000 meq | EXTENDED_RELEASE_TABLET | Freq: Two times a day (BID) | ORAL | 1 refills | Status: DC

## 2021-06-28 ENCOUNTER — Other Ambulatory Visit: Payer: Self-pay | Admitting: *Deleted

## 2021-06-28 ENCOUNTER — Telehealth: Payer: Self-pay | Admitting: *Deleted

## 2021-06-28 NOTE — Telephone Encounter (Signed)
Patient requesting Furosemide refill sent [facility] [place]. Rx has expired on medication list in epic. Please advise. Thank you

## 2021-06-29 MED ORDER — FUROSEMIDE 40 MG PO TABS: 40.0000 mg | ORAL_TABLET | Freq: Every day | ORAL | 1 refills | Status: DC

## 2021-09-06 ENCOUNTER — Telehealth: Payer: Self-pay

## 2021-09-06 MED ORDER — SACUBITRIL-VALSARTAN 24-26 MG PO TABS: 1.0000 | ORAL_TABLET | Freq: Two times a day (BID) | ORAL | 3 refills | Status: DC

## 2021-09-06 NOTE — Telephone Encounter (Signed)
Medication printed out to be signed by Dr. Gasper Sells at next clinic day.  ?

## 2021-09-06 NOTE — Telephone Encounter (Signed)
**Note De-Identified Jackie Littlejohn Obfuscation** A completed [employer] pt asst [employer] application for Delene Loll was left at the office (no documents). ?I have completed the providers page of the pts application and have e-mailed it to Dr Oralia Rud nurse so she can print a Entresto 24-26 mg RX, have Dr Gasper Sells sign it and the application, and to fax to [employer] at the fax number written on the cover letter included. ?

## 2021-09-11 NOTE — Telephone Encounter (Signed)
MD sign script and pt assistance application.  RN faxed as ordered. ?

## 2021-09-20 NOTE — Telephone Encounter (Signed)
**Note De-Identified Jeremey Bascom Obfuscation** Letter received Richard [name] fax from [employer] pt asst foundation stating that they received the pts application for Entresto but need his proof of income and a copy of the front and back of his current ins cards. ?The pt did not leave this info with his application that he dropped off at the office and we have no new ins cards on file for 2023. ?The letter states that they have notified the pt of their need for requested documents as well. ?

## 2021-11-07 ENCOUNTER — Other Ambulatory Visit: Payer: Self-pay | Admitting: Physician Assistant

## 2021-11-09 ENCOUNTER — Encounter (HOSPITAL_COMMUNITY): Payer: Self-pay | Admitting: Emergency Medicine

## 2021-11-09 ENCOUNTER — Inpatient Hospital Stay (HOSPITAL_COMMUNITY)
Admission: EM | Admit: 2021-11-09 | Discharge: 2021-11-15 | DRG: 291 | Disposition: A | Discharge status: Home / Self Care | Payer: 59 | Attending: Family Medicine | Admitting: Family Medicine

## 2021-11-09 ENCOUNTER — Inpatient Hospital Stay (HOSPITAL_COMMUNITY): Payer: 59

## 2021-11-09 ENCOUNTER — Other Ambulatory Visit: Payer: Self-pay

## 2021-11-09 ENCOUNTER — Emergency Department (HOSPITAL_COMMUNITY): Payer: 59

## 2021-11-09 ENCOUNTER — Other Ambulatory Visit (HOSPITAL_COMMUNITY): Payer: Self-pay

## 2021-11-09 DIAGNOSIS — I10 Essential (primary) hypertension: Secondary | ICD-10-CM | POA: Diagnosis not present

## 2021-11-09 DIAGNOSIS — E118 Type 2 diabetes mellitus with unspecified complications: Secondary | ICD-10-CM | POA: Diagnosis present

## 2021-11-09 DIAGNOSIS — I428 Other cardiomyopathies: Secondary | ICD-10-CM | POA: Diagnosis present

## 2021-11-09 DIAGNOSIS — Z833 Family history of diabetes mellitus: Secondary | ICD-10-CM

## 2021-11-09 DIAGNOSIS — E876 Hypokalemia: Secondary | ICD-10-CM | POA: Diagnosis not present

## 2021-11-09 DIAGNOSIS — Z7982 Long term (current) use of aspirin: Secondary | ICD-10-CM | POA: Diagnosis not present

## 2021-11-09 DIAGNOSIS — Z6841 Body Mass Index (BMI) 40.0 and over, adult: Secondary | ICD-10-CM | POA: Diagnosis not present

## 2021-11-09 DIAGNOSIS — I4819 Other persistent atrial fibrillation: Secondary | ICD-10-CM | POA: Diagnosis present

## 2021-11-09 DIAGNOSIS — I071 Rheumatic tricuspid insufficiency: Secondary | ICD-10-CM | POA: Diagnosis present

## 2021-11-09 DIAGNOSIS — E6609 Other obesity due to excess calories: Secondary | ICD-10-CM

## 2021-11-09 DIAGNOSIS — I130 Hypertensive heart and chronic kidney disease with heart failure and stage 1 through stage 4 chronic kidney disease, or unspecified chronic kidney disease: Secondary | ICD-10-CM | POA: Diagnosis present

## 2021-11-09 DIAGNOSIS — N1832 Chronic kidney disease, stage 3b: Secondary | ICD-10-CM | POA: Diagnosis present

## 2021-11-09 DIAGNOSIS — E1169 Type 2 diabetes mellitus with other specified complication: Secondary | ICD-10-CM | POA: Diagnosis present

## 2021-11-09 DIAGNOSIS — I5042 Chronic combined systolic (congestive) and diastolic (congestive) heart failure: Secondary | ICD-10-CM | POA: Diagnosis present

## 2021-11-09 DIAGNOSIS — I472 Ventricular tachycardia, unspecified: Secondary | ICD-10-CM | POA: Diagnosis present

## 2021-11-09 DIAGNOSIS — E1122 Type 2 diabetes mellitus with diabetic chronic kidney disease: Secondary | ICD-10-CM | POA: Diagnosis present

## 2021-11-09 DIAGNOSIS — N184 Chronic kidney disease, stage 4 (severe): Secondary | ICD-10-CM | POA: Diagnosis present

## 2021-11-09 DIAGNOSIS — I509 Heart failure, unspecified: Principal | ICD-10-CM

## 2021-11-09 DIAGNOSIS — R7303 Prediabetes: Secondary | ICD-10-CM

## 2021-11-09 DIAGNOSIS — I493 Ventricular premature depolarization: Secondary | ICD-10-CM | POA: Diagnosis present

## 2021-11-09 DIAGNOSIS — I5021 Acute systolic (congestive) heart failure: Secondary | ICD-10-CM | POA: Insufficient documentation

## 2021-11-09 DIAGNOSIS — K761 Chronic passive congestion of liver: Secondary | ICD-10-CM | POA: Diagnosis present

## 2021-11-09 DIAGNOSIS — Z79899 Other long term (current) drug therapy: Secondary | ICD-10-CM

## 2021-11-09 DIAGNOSIS — I480 Paroxysmal atrial fibrillation: Secondary | ICD-10-CM | POA: Diagnosis present

## 2021-11-09 DIAGNOSIS — I5043 Acute on chronic combined systolic (congestive) and diastolic (congestive) heart failure: Secondary | ICD-10-CM | POA: Diagnosis present

## 2021-11-09 DIAGNOSIS — I639 Cerebral infarction, unspecified: Secondary | ICD-10-CM

## 2021-11-09 DIAGNOSIS — Z83438 Family history of other disorder of lipoprotein metabolism and other lipidemia: Secondary | ICD-10-CM | POA: Diagnosis not present

## 2021-11-09 DIAGNOSIS — E785 Hyperlipidemia, unspecified: Secondary | ICD-10-CM | POA: Diagnosis present

## 2021-11-09 DIAGNOSIS — Z8249 Family history of ischemic heart disease and other diseases of the circulatory system: Secondary | ICD-10-CM

## 2021-11-09 DIAGNOSIS — I081 Rheumatic disorders of both mitral and tricuspid valves: Secondary | ICD-10-CM | POA: Diagnosis not present

## 2021-11-09 DIAGNOSIS — Z8673 Personal history of transient ischemic attack (TIA), and cerebral infarction without residual deficits: Secondary | ICD-10-CM

## 2021-11-09 DIAGNOSIS — I4891 Unspecified atrial fibrillation: Secondary | ICD-10-CM

## 2021-11-09 DIAGNOSIS — N179 Acute kidney failure, unspecified: Secondary | ICD-10-CM | POA: Diagnosis present

## 2021-11-09 DIAGNOSIS — I110 Hypertensive heart disease with heart failure: Secondary | ICD-10-CM | POA: Diagnosis not present

## 2021-11-09 DIAGNOSIS — N189 Chronic kidney disease, unspecified: Secondary | ICD-10-CM

## 2021-11-09 DIAGNOSIS — E669 Obesity, unspecified: Secondary | ICD-10-CM | POA: Diagnosis present

## 2021-11-09 LAB — LACTIC ACID, PLASMA: Lactic Acid, Venous: 1.9 mmol/L (ref 0.5–1.9)

## 2021-11-09 LAB — COMPREHENSIVE METABOLIC PANEL
ALT: 51 U/L — ABNORMAL HIGH (ref 0–44)
ALT: 53 U/L — ABNORMAL HIGH (ref 0–44)
AST: 39 U/L (ref 15–41)
AST: 37 U/L (ref 15–41)
Albumin: 3.3 g/dL — ABNORMAL LOW (ref 3.5–5.0)
Albumin: 3.2 g/dL — ABNORMAL LOW (ref 3.5–5.0)
Alkaline Phosphatase: 45 U/L (ref 38–126)
Alkaline Phosphatase: 38 U/L (ref 38–126)
Anion gap: 13 (ref 5–15)
Anion gap: 10 (ref 5–15)
BUN: 29 mg/dL — ABNORMAL HIGH (ref 6–20)
BUN: 31 mg/dL — ABNORMAL HIGH (ref 6–20)
CO2: 22 mmol/L (ref 22–32)
CO2: 24 mmol/L (ref 22–32)
Calcium: 8.5 mg/dL — ABNORMAL LOW (ref 8.9–10.3)
Calcium: 8.4 mg/dL — ABNORMAL LOW (ref 8.9–10.3)
Chloride: 106 mmol/L (ref 98–111)
Chloride: 107 mmol/L (ref 98–111)
Creatinine, Ser: 2.52 mg/dL — ABNORMAL HIGH (ref 0.61–1.24)
Creatinine, Ser: 2.51 mg/dL — ABNORMAL HIGH (ref 0.61–1.24)
GFR, Estimated: 30 mL/min — ABNORMAL LOW (ref >60)
GFR, Estimated: 30 mL/min — ABNORMAL LOW (ref >60)
Glucose, Bld: 198 mg/dL — ABNORMAL HIGH (ref 70–99)
Glucose, Bld: 201 mg/dL — ABNORMAL HIGH (ref 70–99)
Potassium: 3.5 mmol/L (ref 3.5–5.1)
Potassium: 3.7 mmol/L (ref 3.5–5.1)
Sodium: 141 mmol/L (ref 135–145)
Sodium: 141 mmol/L (ref 135–145)
Total Bilirubin: 0.7 mg/dL (ref 0.3–1.2)
Total Bilirubin: 0.7 mg/dL (ref 0.3–1.2)
Total Protein: 6.1 g/dL — ABNORMAL LOW (ref 6.5–8.1)
Total Protein: 6 g/dL — ABNORMAL LOW (ref 6.5–8.1)

## 2021-11-09 LAB — TROPONIN I (HIGH SENSITIVITY)
Troponin I (High Sensitivity): 466 ng/L (ref <18)
Troponin I (High Sensitivity): 349 ng/L (ref <18)

## 2021-11-09 LAB — CBC WITH DIFFERENTIAL/PLATELET
Abs Immature Granulocytes: 0.03 10*3/uL (ref 0.00–0.07)
Basophils Absolute: 0 10*3/uL (ref 0.0–0.1)
Basophils Relative: 0 %
Eosinophils Absolute: 0.3 10*3/uL (ref 0.0–0.5)
Eosinophils Relative: 4 %
HCT: 44.2 % (ref 39.0–52.0)
Hemoglobin: 14.3 g/dL (ref 13.0–17.0)
Immature Granulocytes: 0 %
Lymphocytes Relative: 26 %
Lymphs Abs: 2.1 10*3/uL (ref 0.7–4.0)
MCH: 29.5 pg (ref 26.0–34.0)
MCHC: 32.4 g/dL (ref 30.0–36.0)
MCV: 91.1 fL (ref 80.0–100.0)
Monocytes Absolute: 0.8 10*3/uL (ref 0.1–1.0)
Monocytes Relative: 9 %
Neutro Abs: 4.8 10*3/uL (ref 1.7–7.7)
Neutrophils Relative %: 61 %
Platelets: 207 10*3/uL (ref 150–400)
RBC: 4.85 MIL/uL (ref 4.22–5.81)
RDW: 14.7 % (ref 11.5–15.5)
WBC: 8 10*3/uL (ref 4.0–10.5)
nRBC: 0.9 % — ABNORMAL HIGH (ref 0.0–0.2)

## 2021-11-09 LAB — ECHOCARDIOGRAM COMPLETE
AR max vel: 1.52 cm2
AV Area VTI: 1.21 cm2
AV Area mean vel: 1.53 cm2
AV Mean grad: 2 mmHg
AV Peak grad: 4 mmHg
Ao pk vel: 1 m/s
Area-P 1/2: 5.37 cm2
Height: 71 in
S' Lateral: 5.1 cm
Weight: 5160.53 oz

## 2021-11-09 LAB — BRAIN NATRIURETIC PEPTIDE: B Natriuretic Peptide: 962.5 pg/mL — ABNORMAL HIGH (ref 0.0–100.0)

## 2021-11-09 LAB — TSH: TSH: 1.293 u[IU]/mL (ref 0.350–4.500)

## 2021-11-09 LAB — HEMOGLOBIN A1C
Hgb A1c MFr Bld: 6.4 % — ABNORMAL HIGH (ref 4.8–5.6)
Mean Plasma Glucose: 136.98 mg/dL

## 2021-11-09 LAB — MAGNESIUM: Magnesium: 1.9 mg/dL (ref 1.7–2.4)

## 2021-11-09 LAB — HIV ANTIBODY (ROUTINE TESTING W REFLEX): HIV Screen 4th Generation wRfx: NONREACTIVE

## 2021-11-09 MED ORDER — AMIODARONE HCL IN DEXTROSE 360-4.14 MG/200ML-% IV SOLN
60.0000 mg/h | INTRAVENOUS | Status: DC
  Administered 2021-11-09: 60 mg/h via INTRAVENOUS
  Filled 2021-11-09: qty 200

## 2021-11-09 MED ORDER — FUROSEMIDE 10 MG/ML IJ SOLN
80.0000 mg | Freq: Two times a day (BID) | INTRAMUSCULAR | Status: DC
Start: 2021-11-09 — End: 2021-11-10
  Administered 2021-11-09: 80 mg via INTRAVENOUS
  Filled 2021-11-09 (×2): qty 8

## 2021-11-09 MED ORDER — FUROSEMIDE 10 MG/ML IJ SOLN
40.0000 mg | Freq: Once | INTRAMUSCULAR | Status: AC
Start: 2021-11-09 — End: 2021-11-09
  Administered 2021-11-09: 40 mg via INTRAVENOUS

## 2021-11-09 MED ORDER — SODIUM CHLORIDE 0.9% FLUSH: 3.0000 mL | INTRAVENOUS | Status: DC | PRN

## 2021-11-09 MED ORDER — SODIUM CHLORIDE 0.9 % IV SOLN: 250.0000 mL | INTRAVENOUS | Status: DC | PRN

## 2021-11-09 MED ORDER — CARVEDILOL 3.125 MG PO TABS: 3.1250 mg | ORAL_TABLET | Freq: Two times a day (BID) | ORAL | Status: DC

## 2021-11-09 MED ORDER — POTASSIUM CHLORIDE CRYS ER 10 MEQ PO TBCR
40.0000 meq | EXTENDED_RELEASE_TABLET | Freq: Every day | ORAL | Status: DC
  Administered 2021-11-09: 40 meq via ORAL
  Filled 2021-11-09 (×3): qty 4

## 2021-11-09 MED ORDER — MAGNESIUM OXIDE -MG SUPPLEMENT 400 (240 MG) MG PO TABS
800.0000 mg | ORAL_TABLET | Freq: Every day | ORAL | Status: AC
  Administered 2021-11-09 – 2021-11-11 (×3): 800 mg via ORAL
  Filled 2021-11-09 (×3): qty 2

## 2021-11-09 MED ORDER — ACETAMINOPHEN 325 MG PO TABS
650.0000 mg | ORAL_TABLET | ORAL | Status: DC | PRN
Start: 2021-11-09 — End: 2021-11-15
  Administered 2021-11-11 – 2021-11-13 (×2): 650 mg via ORAL
  Filled 2021-11-09 (×2): qty 2

## 2021-11-09 MED ORDER — AMIODARONE LOAD VIA INFUSION
150.0000 mg | Freq: Once | INTRAVENOUS | Status: AC
  Administered 2021-11-09: 150 mg via INTRAVENOUS
  Filled 2021-11-09: qty 83.34

## 2021-11-09 MED ORDER — AMLODIPINE BESYLATE 5 MG PO TABS: 2.5000 mg | ORAL_TABLET | Freq: Every day | ORAL | Status: DC

## 2021-11-09 MED ORDER — ONDANSETRON HCL 4 MG/2ML IJ SOLN: 4.0000 mg | Freq: Four times a day (QID) | INTRAMUSCULAR | Status: DC | PRN

## 2021-11-09 MED ORDER — PERFLUTREN LIPID MICROSPHERE
1.0000 mL | INTRAVENOUS | Status: AC | PRN
  Administered 2021-11-09: 2 mL via INTRAVENOUS

## 2021-11-09 MED ORDER — HYDRALAZINE HCL 20 MG/ML IJ SOLN
10.0000 mg | Freq: Four times a day (QID) | INTRAMUSCULAR | Status: DC | PRN
  Administered 2021-11-09 – 2021-11-14 (×6): 10 mg via INTRAVENOUS
  Filled 2021-11-09 (×6): qty 1

## 2021-11-09 MED ORDER — SODIUM CHLORIDE 0.9% FLUSH
3.0000 mL | Freq: Two times a day (BID) | INTRAVENOUS | Status: DC
  Administered 2021-11-09 – 2021-11-15 (×12): 3 mL via INTRAVENOUS

## 2021-11-09 MED ORDER — APIXABAN 5 MG PO TABS
5.0000 mg | ORAL_TABLET | Freq: Two times a day (BID) | ORAL | Status: DC
  Administered 2021-11-09 – 2021-11-15 (×13): 5 mg via ORAL
  Filled 2021-11-09 (×13): qty 1

## 2021-11-09 MED ORDER — FUROSEMIDE 10 MG/ML IJ SOLN
40.0000 mg | Freq: Once | INTRAMUSCULAR | Status: AC
  Administered 2021-11-09: 40 mg via INTRAVENOUS
  Filled 2021-11-09: qty 4

## 2021-11-09 MED ORDER — FUROSEMIDE 10 MG/ML IJ SOLN
80.0000 mg | Freq: Two times a day (BID) | INTRAMUSCULAR | Status: DC
  Filled 2021-11-09: qty 8

## 2021-11-09 MED ORDER — CARVEDILOL 6.25 MG PO TABS
6.2500 mg | ORAL_TABLET | Freq: Two times a day (BID) | ORAL | Status: DC
  Administered 2021-11-09 (×2): 6.25 mg via ORAL
  Filled 2021-11-09: qty 1
  Filled 2021-11-09: qty 2
  Filled 2021-11-09: qty 1

## 2021-11-09 MED ORDER — AMIODARONE HCL IN DEXTROSE 360-4.14 MG/200ML-% IV SOLN: 30.0000 mg/h | INTRAVENOUS | Status: DC

## 2021-11-09 NOTE — Progress Notes (Addendum)
Heart Failure Stewardship Pharmacist Progress Note   PCP: Maximiano Coss, NP PCP-Cardiologist: Werner Lean, MD    HPI:  51 y.o. male who presented after 3 weeks of lower extremity edema that has migrated to the abdomen.  This was associated with dyspnea on exertion.  He stated he has not been watching his fluid intake as carefully as he should've been recently. Prior to admission patient with known history of heart failure  (EF 20-25% in 05/2019) and was on Lasix, Entresto and spironolactone.  In the past patient has been unable to follow-up with primary cardiologist due to insurance concerns.  He states Delene Loll was very expensive but he has been taking this as ordered. He has a few days left.   In the ER patient was found to have acute kidney injury, clinical signs consistent with acute heart failure noted 2 view chest x-ray revealed vascular congestion without edema.  BNP was elevated greater than 900 and troponin elevated which is consistent with likely demand ischemia.   Patient was found to have new onset atrial fib with RVR. Cardiology was consulted.  Rate control EDP had ordered IV amiodarone but this was discontinued by the cardiology physician.  Home carvedilol was continued but at a lower dose.    Patient had a prior amyloid work-up which was negative and a cath in 2014 that was negative for CAD. New echo pending.   Current HF Medications: Diuretic: furosemide 80 mg IV BID Beta Blocker: carvedilol 6.25 mg BID ACE/ARB/ARNI: none Aldosterone Antagonist: none SGLT2i: none Other: Kcl 40 mEq x 3 doses  Prior to admission HF Medications: Diuretic: furosemide 40 mg daily Beta blocker: carvedilol 25 mg BID ACE/ARB/ARNI: Entresto 24/26 mg BID Aldosterone Antagonist: Spironolactone 12.5 mg daily SGLT2i: none Other: Kcl 40 mEq BID Dispense report indicates recent fills of all medications above except Entresto.   Pertinent Lab Values: Serum creatinine 2.52, BUN 29,  Potassium 3.5, Sodium 141, BNP 962.5, Magnesium 1.9, A1c pending  Vital Signs: Weight: 322 lbs (admission weight: 322 lbs) Blood pressure: 147/100  Heart rate: 78  I/O: not yet documented  Medication Assistance / Insurance Benefits Check: Does the patient have prescription insurance?  Yes Type of insurance plan: Nurse, learning disability  Outpatient Pharmacy:  [facility] Is the patient willing to use [place] at discharge? Pending Is the patient willing to transition their outpatient pharmacy to utilize a [facility] outpatient pharmacy?   Pending   Assessment: 1. Acute on chronic systolic CHF (LVEF 62-94% in 05/2019), due to NICM. NYHA class II-III symptoms. - Scr 2.52, BL appears to be 1.62. Significant AKI. Agree with holding home Entresto and spironolactone.  - Severely volume overloaded. Agree with furosemide 80 mg IV BID and assess response. Goal net negative 2L/24 hours per cardiology.  -Agree with half home dose of carvedilol for rate control of AF with significant volume overload currently. -A1c pending. Could benefit from Farxiga/Jardiance, but cost may be an issue. Defer for now.   -Keep K >4, Mg >2   Plan: 1) Medication changes recommended at this time: -Check anemia panel  2) Patient assistance: -Need to determine insurance deductible -Eliquis- patient can use copay cards, however will only cover cost for 9 months a year, will likely need 3 months of samples -Entresto- will need to complete patient assistance if not already done. Per notes application was done for 2023, but new insurance card and tax forms were not sent in. -Farxiga- requires PA,  unclear of cost Calling insurance to determine if copays cheaper at specific pharmacies  3)  Education  - To be completed prior to discharge  Cathrine Muster, PharmD, [facility] PGY2 Cardiology Pharmacy Resident

## 2021-11-09 NOTE — TOC Benefit Eligibility Note (Signed)
Transition of Care [facility]) Benefit Eligibility Note    Patient Details  Name: Richard [name] MRN: [id number] Date of Birth: 11/06/1970   Medication/Dose: Delene Loll 24-26 MG BID  Covered?: Yes  Tier: 3 Drug  Prescription [facility]age [facility]  Spoke with Person/Company/Phone Number:: GAIL  @ [facility] RX #  [phone]  Co-Pay: $641.81  Prior Approval: No  Deductible: Unmet (OUT-OF-POCKET-UNMET)  Additional Notes: ELIQUIS  2.5 MG BID  and  5 MG BID :  COVER- YES , CO-PAY- $538.85 , TIER- 3 DRUG., PRIOR APPROVAL- NO  DEDUCTIBLE : UNMET  and  OUT-OF-POCKET:UNMET    Memory Argue Phone Number: 11/09/2021, 4:16 PM

## 2021-11-09 NOTE — ED Provider Triage Note (Addendum)
Emergency Medicine Provider Triage Evaluation Note  Richard [name] , a 51 y.o. male  was evaluated in triage.  Pt complains of progressively worsening swelling in his inner thighs and abdomen over the past week and since evening of 6/7 he is also endorsing shortness of breath.  Denies chest pain.  Endorses compliance with all of his home medications including Lasix.  Has history of CHF.  Has not seen his cardiologist in over a year due to insurance issues.  Denies orthopnea or PND.  Review of Systems  Positive: As above Negative: As above  Physical Exam  BP (!) 128/100 (BP Location: Right Arm)   Pulse 84   Temp 98.6 F (37 C)   Resp 18   Ht 5\' 11"  (1.803 m)   Wt 135 kg   SpO2 97%   BMI 41.51 kg/m  Gen:   Awake, no distress   Resp:  Normal effort  MSK:   Moves extremities without difficulty  Other:    Medical Decision Making  Medically screening exam initiated at 12:41 AM.  Appropriate orders placed.  Mauro Arps was informed that the remainder of the evaluation will be completed by another provider, this initial triage assessment does not replace that evaluation, and the importance of remaining in the ED until their evaluation is complete.  0250: Patient's initial troponin was elevated at 466.  Notified nursing that patient needs to be roomed next.  He remains without chest pain.   Evlyn Courier, PA-C 11/09/21 [place], [place], PA-C 11/09/21 [phone]

## 2021-11-09 NOTE — Progress Notes (Signed)
ANTICOAGULATION CONSULT NOTE - Initial Consult  Pharmacy Consult for Eliquis Indication: atrial fibrillation  No Known Allergies  Patient Measurements: Height: 5\' 11"  (180.3 cm) Weight: 135 kg (297 lb 9.9 oz) IBW/kg (Calculated) : 75.3   Vital Signs: Temp: 98 F (36.7 C) (06/08 0243) Temp Source: Oral (06/08 0243) BP: 157/111 (06/08 0505) Pulse Rate: 70 (06/08 0505)  Labs: Recent Labs    11/09/21 0054 11/09/21 0256  HGB 14.3  --   HCT 44.2  --   PLT 207  --   CREATININE 2.52*  --   TROPONINIHS 466* 349*    Estimated Creatinine Clearance: 48.7 mL/min (A) (by C-G formula based on SCr of 2.52 mg/dL (H)).   Medical History: Past Medical History:  Diagnosis Date   Acute decompensated heart failure ([place]) 01/03/2013   Allergy    CHF (congestive heart failure) ([facility])    systolic   Chronic kidney disease    CKD (chronic kidney disease) stage 2, GFR 60-89 ml/min 01/04/2013   DM (diabetes mellitus) ([place]) 01/06/2013   Hypertension    hx htn emergency   Obesity    Stroke [facility])     Assessment: 62 yoM with new onset paroxysmal afib; no anticoagulation prior to admission, weight 135 Kg, sCr 2.52, CBC WNL; Pharmacy asked to enter Eliquis dose  Plan:  start eliquis 5mg  BID  Georga Bora, PharmD Clinical Pharmacist 11/09/2021 5:49 AM Please check AMION for all [place] numbers

## 2021-11-09 NOTE — Discharge Instructions (Signed)

## 2021-11-09 NOTE — ED Triage Notes (Signed)
Patient reports increasing swelling/edema at abdomen and SOB onset last week .

## 2021-11-09 NOTE — Progress Notes (Signed)
Heart Failure Navigator Progress Note  Following this hospitalization to assess for HV TOC readiness.   Echo pending? Last 2020/12  EF 20-25% HFrEF  Earnestine Leys, BSN, RN Heart Failure [place]isure [place] manager Chat Only

## 2021-11-09 NOTE — Progress Notes (Signed)
Progress Note  Patient Name: Richard [name] Date of Encounter: 11/09/2021  [facility] Cardiologist: Werner Lean, MD   Subjective   Richard [name] feels less tightness in his abdomen  Wt Readings from Last 3 Encounters:  11/09/21 135 kg  11/08/20 126.6 kg  11/01/20 134 kg     Inpatient Medications    Scheduled Meds:  apixaban  5 mg Oral BID   furosemide  80 mg Intravenous BID   magnesium oxide  800 mg Oral Daily   potassium chloride  40 mEq Oral Daily   Continuous Infusions:  PRN Meds:    Vital Signs    Vitals:   11/09/21 0430 11/09/21 0503 11/09/21 0505 11/09/21 0615  BP: (!) 142/129 (!) 157/111 (!) 157/111 (!) 122/91  Pulse:   70 (!) 58  Resp: (!) 22 (!) 22 (!) 25 (!) 22  Temp:      TempSrc:      SpO2: 98%  98% 96%  Weight:      Height:        Intake/Output Summary (Last 24 hours) at 11/09/2021 0722 Last data filed at 11/09/2021 9357 Gross per 24 hour  Intake 46.77 ml  Output --  Net 46.77 ml      11/09/2021   12:34 AM 11/08/2020   10:04 AM 11/01/2020    3:08 PM  Last 3 Weights  Weight (lbs) 297 lb 9.9 oz 279 lb 295 lb 6.4 oz  Weight (kg) 135 kg 126.554 kg 133.993 kg      Telemetry    Atrial fibrillation with RVR - Personally Reviewed  ECG    Atrial fibrillation with RVR; bidirectional PVCs - Personally Reviewed  Physical Exam   Vitals:   11/09/21 0505 11/09/21 0615  BP: (!) 157/111 (!) 122/91  Pulse: 70 (!) 58  Resp: (!) 25 (!) 22  Temp:    SpO2: 98% 96%    GEN: No acute distress.   Neck: ++ JVD Cardiac: irregular rate and rhythm, no murmurs, rubs, or gallops.  Respiratory: nl wob, decreased BS in the bases GI: distended MS: No LE edema; No deformity. Neuro:  Nonfocal  Psych: Normal affect   Labs    High Sensitivity Troponin:   Recent Labs  Lab 11/09/21 0054 11/09/21 0256  TROPONINIHS 466* 349*     Chemistry Recent Labs  Lab 11/09/21 0054  NA 141  K 3.5  CL 106  CO2 22  GLUCOSE 198*  BUN 29*  CREATININE  2.52*  CALCIUM 8.5*  MG 1.9  PROT 6.1*  ALBUMIN 3.3*  AST 39  ALT 51*  ALKPHOS 45  BILITOT 0.7  GFRNONAA 30*  ANIONGAP 13    Lipids No results for input(s): "CHOL", "TRIG", "HDL", "LABVLDL", "LDLCALC", "CHOLHDL" in the last 168 hours.  Hematology Recent Labs  Lab 11/09/21 0054  WBC 8.0  RBC 4.85  HGB 14.3  HCT 44.2  MCV 91.1  MCH 29.5  MCHC 32.4  RDW 14.7  PLT 207   Thyroid No results for input(s): "TSH", "FREET4" in the last 168 hours.  BNP Recent Labs  Lab 11/09/21 0055  BNP 962.5*    DDimer No results for input(s): "DDIMER" in the last 168 hours.   Radiology    DG Chest 2 View  Result Date: 11/09/2021 CLINICAL DATA:  Dyspnea EXAM: CHEST - 2 VIEW COMPARISON:  05/20/2019 FINDINGS: Cardiac shadow is enlarged. Mild vascular congestion is noted without significant interstitial edema. No focal infiltrate or effusion is noted. No bony abnormality  is seen. IMPRESSION: Vascular congestion without edema. Electronically Signed   By: Inez Catalina M.D.   On: 11/09/2021 01:36    Cardiac Studies   Transthoracic Echocardiogram: Date: 05/20/2019 Results:  1. Left ventricular ejection fraction, by visual estimation, is 50 to  55%. The left ventricle has low normal function. There is moderately  increased left ventricular hypertrophy.   2. Indeterminate diastolic filling due to E-A fusion.   3. The left ventricle has no regional wall motion abnormalities.   4. Global right ventricle has low normal systolic function.The right  ventricular size is normal. No increase in right ventricular wall  thickness.   5. Left atrial size was mildly dilated.   6. Right atrial size was mildly dilated.   7. Trivial pericardial effusion is present.   8. The mitral valve is grossly normal. Mild mitral valve regurgitation.   9. The tricuspid valve is grossly normal. Tricuspid valve regurgitation  is mild.  10. The aortic valve is tricuspid. Aortic valve regurgitation is not  visualized. Mild  aortic valve sclerosis without stenosis.  11. The pulmonic valve was grossly normal. Pulmonic valve regurgitation is  not visualized.  12. Mildly elevated pulmonary artery systolic pressure.  13. The tricuspid regurgitant velocity is 3.06 m/s, and with an assumed  right atrial pressure of 3 mmHg, the estimated right ventricular systolic  pressure is mildly elevated at 40.5 mmHg.  14. The inferior vena cava is normal in size with greater than 50%  respiratory variability, suggesting right atrial pressure of 3 mmHg.  15. A prior study was performed on 03/15/2016.  16. No significant change from prior study.   Patient Profile     Richard [name] is a 51 y.o. male with a hx of  NICM and HFrEF (EF 20-25%), 2/21 Stroke with prior heart monitoring (loop deferred), h/o non compliance, DM-2, CKD stage 3, obesity who is being seen 11/09/2021 for the evaluation of ac decompensated heart failure at the request of Dr Dayna Barker.  Assessment & Plan     #NICM NYHA Class II-III Stage C HFpeF: EF prior 20-25% with EF improvement to mildlly reduced in 2017. Had work up for amyloid which was negative.  He had cath in 2014 that showed no obstruction.  This was in the setting of poorly controlled HTN. He notes challenges with entresto 2/2 cost. Decompensation in the setting of job transitions and loss of insurance and inability to be consistent with his medications and follow-up. Here he has elevated BNP 962, congestion on his xray and gut edema. Troponin elevation likely in the setting of CHF. - wt 135 kg/297 pounds on arrival ( has not been seen in awhile, no recent dry weight) - crt 1.62 ( 11/18/2020)-> 2.52 - continue lasix 80 mg IV BID, can add metolazone 2.5 mg if he does not reach goal of closer to net negative 2L in 24hrs -K>4, Mg>2 - hold home entresto and home spironolactone, SGLT2 with significant AKI - FU echo - will recommend GDMT medication assistance closer to discharge  #Paroxysmal atrial fibrillation:  new onset in the setting of decompensated NICM heart failure. He is initiated on eliquis. Will consider TEE/DCCV if he does not convert to sinus rhythm once he is closer to euvolemia. Will start back lower dose home coreg  #Prior CVA: stable. He had TEE in 2020 negative for PFO.  For questions or updates, please contact [place] Please consult [email or web address] for contact info under        Signed, Wynston Romey,  Royetta Crochet, MD  11/09/2021, 7:22 AM

## 2021-11-09 NOTE — H&P (Signed)
History and Physical    Patient: Richard [name] JIR:[id number] DOB: Oct 20, 1970 DOA: 11/09/2021 DOS: the patient was seen and examined on 11/09/2021 PCP: Maximiano Coss, NP  Patient coming from: Home  Chief Complaint:  Chief Complaint  Patient presents with   SOB / Abdominal Swelling    HPI: Richard [name] is a 51 y.o. male with medical history significant of who presented after 3 weeks of lower extremity edema that has migrated to the abdomen.  This was associated with dyspnea on exertion.  Prior to admission patient with known history of heart failure and was on Lasix, Entresto and spironolactone.  In the past patient has been unable to follow-up with primary cardiologist due to insurance concerns.  He states Delene Loll was very expensive but he has been taking this as ordered.  In the ER patient was found to have acute kidney injury, clinical signs consistent with acute heart failure noted 2 view chest x-ray revealed vascular congestion without edema.  He was elevated greater than 900 and troponin elevated which is consistent with likely demand ischemia.  Patient was found to have new onset atrial fib with RVR.  Cardiology was consulted.  Rate control EDP had ordered IV amiodarone but this was discontinued by the cardiology physician.  Home carvedilol was continued but at a lower dose.  Hospitalist admission requested.  Review of Systems: As mentioned in the history of present illness. All other systems reviewed and are negative. Past Medical History:  Diagnosis Date   Acute decompensated heart failure ([place]) 01/03/2013   Allergy    CHF (congestive heart failure) (H[facility])    systolic   Chronic kidney disease    CKD (chronic kidney disease) stage 2, GFR 60-89 ml/min 01/04/2013   DM (diabetes mellitus) ([place]) 01/06/2013   Hypertension    hx htn emergency   Obesity    Stroke [facility]    Past Surgical History:  Procedure Laterality Date   BUBBLE STUDY  05/22/2019   Procedure: BUBBLE STUDY;  Surgeon:  Skeet Latch, MD;  Location: [place];  Service: Cardiovascular;;   LEFT AND RIGHT HEART CATHETERIZATION WITH CORONARY ANGIOGRAM N/A 01/07/2013   Procedure: LEFT AND RIGHT HEART CATHETERIZATION WITH CORONARY ANGIOGRAM;  Surgeon: Birdie Riddle, MD;  Location: [place] CATH LAB;  Service: Cardiovascular;  Laterality: N/A;   TEE WITHOUT CARDIOVERSION N/A 05/22/2019   Procedure: TRANSESOPHAGEAL ECHOCARDIOGRAM (TEE);  Surgeon: Skeet Latch, MD;  Location: [place];  Service: Cardiovascular;  Laterality: N/A;   Social History:  reports that he has never smoked. He has never used smokeless tobacco. He reports that he does not drink alcohol and does not use drugs.  No Known Allergies  Family History  Problem Relation Age of Onset   Heart disease Father 25       MI   Hypertension Father    Hyperlipidemia Father    Diabetes Mother    Hypertension Mother     Prior to Admission medications   Medication Sig Start Date End Date Taking? Authorizing Provider  acetaminophen (TYLENOL) 500 MG tablet Take 1,000 mg by mouth every 6 (six) hours as needed for moderate pain or headache.   Yes [provider]  amLODipine (NORVASC) 2.5 MG tablet Take 1 tablet (2.5 mg total) by mouth daily. 07/20/19  Yes Shirley Friar, PA-C  aspirin EC 81 MG EC tablet Take 1 tablet (81 mg total) by mouth daily. 05/22/19  Yes Dessa Phi, DO  carvedilol (COREG) 25 MG tablet TAKE 1 TABLET (25 MG TOTAL) BY  MOUTH 2 (TWO) TIMES DAILY WITH A MEAL. 03/28/21  Yes Chandrasekhar, Mahesh A, MD  furosemide (LASIX) 40 MG tablet Take 1 tablet (40 mg total) by mouth daily. Pt. Needs to make an appt. With Cardiologist in order to receive future refills. Thank You. 1st Attempt. 11/07/21  Yes Baldwin Jamaica, PA-C  Multiple Vitamins-Minerals (MULTIVITAMIN GUMMIES ADULTS PO) Take 2 tablets by mouth daily.   Yes [provider]  Omega-3 Fatty Acids (FISH OIL) 1000 MG CAPS Take 1,000 mg by mouth daily.   Yes  [provider]  potassium chloride SA (KLOR-CON M) 20 MEQ tablet Take 1 tablet (20 mEq total) by mouth 2 (two) times daily. 05/02/21  Yes Camnitz, Will Hassell Done, MD  sacubitril-valsartan (ENTRESTO) 24-26 MG Take 1 tablet by mouth 2 (two) times daily. 09/06/21  Yes Chandrasekhar, Mahesh A, MD  spironolactone (ALDACTONE) 25 MG tablet Take 0.5 tablets (12.5 mg total) by mouth daily. Pt. Needs to make an appt. With Cardiologist in order to receive future refills. Thank You. 1st Attempt. 11/07/21  Yes Baldwin Jamaica, PA-C  atorvastatin (LIPITOR) 40 MG tablet Take 1 tablet (40 mg total) by mouth daily. Patient not taking: Reported on 11/09/2021 06/10/19   Maximiano Coss, NP    Physical Exam: Vitals:   11/09/21 0505 11/09/21 0615 11/09/21 0815 11/09/21 0900  BP: (!) 157/111 (!) 122/91 (!) 152/95 (!) 123/107  Pulse: 70 (!) 58 (!) 120   Resp: (!) 25 (!) 22  (!) 25  Temp:      TempSrc:      SpO2: 98% 96%    Weight:      Height:       Constitutional: NAD, calm, comfortable Eyes: PERRL, lids and conjunctivae normal ENMT: Mucous membranes are moist. Posterior pharynx clear of any exudate or lesions.Normal dentition.  Neck: normal, supple, no masses, no thyromegaly Respiratory: clear to auscultation bilaterally except for bibasilar crackles greater on the right, patient is sitting upright in the stretcher at a 45 degree head of bed elevation and noted to have normal respiratory effort. No accessory muscle use.  Stable on room air with normal O2 saturation Cardiovascular: Monitor reveals atrial fibrillation with variable ventricular rate between 85 and 127, no murmurs / rubs / gallops.  No S3 or S4.  2+ bilateral extremity edema. 2+ pedal pulses. No carotid bruits.  Abdomen: no tenderness, no masses palpated. No hepatosplenomegaly. Bowel sounds positive.  Musculoskeletal: no clubbing / cyanosis. No joint deformity upper and lower extremities. Good ROM, no contractures. Normal muscle tone.  Skin: no  rashes, lesions, ulcers. No induration Neurologic: CN 2-12 grossly intact. Sensation intact, DTR normal. Strength 5/5 x all 4 extremities.  Psychiatric: Normal judgment and insight. Alert and oriented x 3. Normal mood.    Data Reviewed:  Sodium 141, potassium 3.5, glucose 198, BUN 29, creatinine 2.52, calcium 8.5, AST normal, ALT slightly elevated at 51, normal total bilirubin, first troponin 466 with second troponin 349, BNP 962, white count 8000 with normal shift, hemoglobin 14.3, platelets 207,000.  EKG revealed atrial fibrillation with RVR, some PVCs.  Ventricular rate 103 bpm  Assessment and Plan:  Acute combined systolic and diastolic heart failure Patient presents with 3 weeks of progressive edema starting in the legs and eventually extending to the abdomen.Was having some dyspnea on exertion primarily when walking from the parking lot to his work setting-apparent orthopnea  BNP elevated at 962 Prior to admission patient was on Entresto and Lasix as well as carvedilol and spironolactone; cardiology  has ordered Lasix 80 mg twice daily.  Potassium 3.5 at admission.  Cardiology has ordered 40 mill equivalents daily x3 days Appreciate cardiology consultation.   Due to acute kidney injury cardiology wishes to hold Entresto, spironolactone and if still taking hold dapagliflozin Continue beta-blocker/carvedilol but at a much lower dose than home dose per recommendation of cardiology Patient's last TTE in 2020 demonstrated EF around 50% with moderate LVH but TEE done at the same time reveals an EF of 20 to 25% Radiology has ordered follow-up echo this admission Strict INO and daily weights Transaminitis secondary to passive hepatic congestion from heart failure  New onset atrial fib with RVR Patient without any awareness of tachyarrhythmia.  Suspect started at least 3 weeks ago given this was the onset of his edema and worsening shortness of breath-patient will check his Apple watch settings  to see if it detected any tachycardia Have asked for O2 to be applied if patient tachypneic or sats less than 92% Was given amiodarone in the ER but this was stopped by cardiology.  Continue carvedilol.  Avoid calcium channel blockers with systolic dysfunction If rate continues to remain elevated cardiology considering synchronized cardioversion Eliquis started this admission-I have asked TOC to determine if insurance preauthorization required Patient has elevated troponin likely related to demand ischemia from RVR as well as acute heart failure Keep potassium greater than 4 and magnesium greater than 2.0 Check TSH  Acute kidney injury on stage IIIb chronic kidney disease Baseline creatinine 1.62 with current creatinine 2.54 Suspect combination of acute heart failure with decreased renal perfusion as well as recent marked elevation in blood pressure decreasing renal perfusion Continue to follow labs  Hypertension Entresto on hold as above Blood pressure initially markedly elevated but has significantly improved with aggressive diuresis as ordered by cardiology Hold home Norvasc for now-as needed IV Apresoline available if blood pressure increases  History of CVA Patient had not been taking statin prior to admission Prior to admission was on aspirin-with initiation of NOAC will hold aspirin  Prediabetes Hemoglobin A1c in 2017 was 8.7.  Most recent was 2021 and down to 5.5 Need to clarify preadmission medications since no diabetes medication listed  Obesity Counseling for weight reduction recommended in the outpatient Body mass index is 41.51 kg/m.     Advance Care Planning:    Code Status: Full Code   Consults:  Cardiology  Family Communication:  Patient only  DVT prophylaxis: Started on Eliquis this admission  Severity of Illness: The appropriate patient status for this patient is INPATIENT. Inpatient status is judged to be reasonable and necessary in order to provide  the required intensity of service to ensure the patient's safety. The patient's presenting symptoms, physical exam findings, and initial radiographic and laboratory data in the context of their chronic comorbidities is felt to place them at high risk for further clinical deterioration. Furthermore, it is not anticipated that the patient will be medically stable for discharge from the hospital within 2 midnights of admission.   * I certify that at the point of admission it is my clinical judgment that the patient will require inpatient hospital care spanning beyond 2 midnights from the point of admission due to high intensity of service, high risk for further deterioration and high frequency of surveillance required.*  Author: Erin Hearing, NP 11/09/2021 10:12 AM  For on call review [email or web address].

## 2021-11-09 NOTE — Consult Note (Signed)
Cardiology Consultation:   Patient ID: Richard [name] MRN: [id number]; DOB: 1970/07/04  Admit date: 11/09/2021 Date of Consult: 11/09/2021  PCP:  Maximiano Coss, NP   [facility] Providers Cardiologist:  Werner Lean, MD        Patient Profile:   Richard [name] is a 51 y.o. male with a hx of  NICM and HFrEF (EF 20-25%), 2/21 Stroke with prior heart monitoring (loop deferred), h/o non compliance, DM-2, CKD stage 3, obesity who is being seen 11/09/2021 for the evaluation of ac decompensated HFrEF at the request of Dr Dayna Barker.  History of Present Illness:   Richard [name] is a 50 y.o. male with a hx of  NICM and HFrEF (EF 20-25%), 2/21 Stroke with prior heart monitoring (loop deferred), h/o non compliance, DM-2, CKD stage 3, obesity who is being seen 11/09/2021 for the evaluation of ac decompensated HFrEF  Patient last seen Dr Gasper Sells on 11/08/20, has not been seen due to insurance issues Presents with c/o progressive sob and worsening swelling in abdomen and legs for the past week. Most of the symptoms started Sunday and got worse. Denies any chest pain, compliant with medications Denies any drug use  Er work up: Cr upto 2.52 (baseline 1.4), bnp 962 Trop 466->349 EKG: afib with frequent PVCs ER tele reported frequent runs of NSVT  Prior cardiac studies: Transthoracic Echocardiogram: Date: 05/20/2019 Results:  1. Left ventricular ejection fraction, by visual estimation, is 50 to  55%. The left ventricle has low normal function. There is moderately  increased left ventricular hypertrophy.   2. Indeterminate diastolic filling due to E-A fusion.   3. The left ventricle has no regional wall motion abnormalities.   4. Global right ventricle has low normal systolic function.The right  ventricular size is normal. No increase in right ventricular wall  thickness.   5. Left atrial size was mildly dilated.   6. Right atrial size was mildly dilated.   7. Trivial pericardial  effusion is present.   8. The mitral valve is grossly normal. Mild mitral valve regurgitation.   9. The tricuspid valve is grossly normal. Tricuspid valve regurgitation  is mild.  10. The aortic valve is tricuspid. Aortic valve regurgitation is not  visualized. Mild aortic valve sclerosis without stenosis.  11. The pulmonic valve was grossly normal. Pulmonic valve regurgitation is  not visualized.  12. Mildly elevated pulmonary artery systolic pressure.  13. The tricuspid regurgitant velocity is 3.06 m/s, and with an assumed  right atrial pressure of 3 mmHg, the estimated right ventricular systolic  pressure is mildly elevated at 40.5 mmHg.  14. The inferior vena cava is normal in size with greater than 50%  respiratory variability, suggesting right atrial pressure of 3 mmHg.  15. A prior study was performed on 03/15/2016.  16. No significant change from prior study.    Transesophageal Echocardiogram: Date: 05/22/2019 Results:  1. Left ventricular ejection fraction, by visual estimation, is 20 to  25%. The left ventricle has severely decreased function. There is no left  ventricular hypertrophy.   2. The left ventricle demonstrates global hypokinesis.   3. Global right ventricle has moderately reduced systolic function.The  right ventricular size is normal. No increase in right ventricular wall  thickness.   4. Left atrial size was mildly dilated.   5. Right atrial size was normal.   6. The mitral valve is normal in structure. Trivial mitral valve  regurgitation. No evidence of mitral stenosis.   7. The tricuspid valve is normal  in structure. Tricuspid valve  regurgitation is mild.   8. The aortic valve is normal in structure. Aortic valve regurgitation is  not visualized. No evidence of aortic valve sclerosis or stenosis.   9. The pulmonic valve was normal in structure. Pulmonic valve  regurgitation is trivial.  10. The inferior vena cava is normal in size with greater than 50%   respiratory variability, suggesting right atrial pressure of 3 mmHg.     Past Medical History:  Diagnosis Date   Acute decompensated heart failure ([place]) 01/03/2013   Allergy    CHF (congestive heart failure) (HCC)    systolic   Chronic kidney disease    CKD (chronic kidney disease) stage 2, GFR 60-89 ml/min 01/04/2013   DM (diabetes mellitus) ([place]) 01/06/2013   Hypertension    hx htn emergency   Obesity    Stroke [facility])     Past Surgical History:  Procedure Laterality Date   BUBBLE STUDY  05/22/2019   Procedure: BUBBLE STUDY;  Surgeon: Skeet Latch, MD;  Location: [place];  Service: Cardiovascular;;   LEFT AND RIGHT HEART CATHETERIZATION WITH CORONARY ANGIOGRAM N/A 01/07/2013   Procedure: LEFT AND RIGHT HEART CATHETERIZATION WITH CORONARY ANGIOGRAM;  Surgeon: Birdie Riddle, MD;  Location: [place] CATH LAB;  Service: Cardiovascular;  Laterality: N/A;   TEE WITHOUT CARDIOVERSION N/A 05/22/2019   Procedure: TRANSESOPHAGEAL ECHOCARDIOGRAM (TEE);  Surgeon: Skeet Latch, MD;  Location: [place];  Service: Cardiovascular;  Laterality: N/A;     Home Medications:  Prior to Admission medications   Medication Sig Start Date End Date Taking? Authorizing Provider  amLODipine (NORVASC) 2.5 MG tablet Take 1 tablet (2.5 mg total) by mouth daily. 07/20/19   Shirley Friar, PA-C  aspirin EC 81 MG EC tablet Take 1 tablet (81 mg total) by mouth daily. 05/22/19   Dessa Phi, DO  atorvastatin (LIPITOR) 40 MG tablet Take 1 tablet (40 mg total) by mouth daily. 06/10/19   Maximiano Coss, NP  carvedilol (COREG) 25 MG tablet TAKE 1 TABLET (25 MG TOTAL) BY MOUTH 2 (TWO) TIMES DAILY WITH A MEAL. 03/28/21   Werner Lean, MD  cholecalciferol (VITAMIN D3) 25 MCG (1000 UT) tablet Take 1,000 Units by mouth daily.    [provider]  co-enzyme Q-10 30 MG capsule Take 60 mg by mouth daily. Take 2 caps po daily    [provider]  furosemide (LASIX) 40 MG tablet Take 1  tablet (40 mg total) by mouth daily. Pt. Needs to make an appt. With Cardiologist in order to receive future refills. Thank You. 1st Attempt. 11/07/21   Baldwin Jamaica, PA-C  Multiple Vitamin (MULTIVITAMIN WITH MINERALS) TABS tablet Take 1 tablet by mouth daily.    [provider]  Omega-3 Fatty Acids (FISH OIL) 1000 MG CAPS Take 1 capsule by mouth daily.    [provider]  potassium chloride SA (KLOR-CON M) 20 MEQ tablet Take 1 tablet (20 mEq total) by mouth 2 (two) times daily. 05/02/21   Camnitz, Will Hassell Done, MD  sacubitril-valsartan (ENTRESTO) 24-26 MG Take 1 tablet by mouth 2 (two) times daily. 09/06/21   Werner Lean, MD  spironolactone (ALDACTONE) 25 MG tablet Take 0.5 tablets (12.5 mg total) by mouth daily. Pt. Needs to make an appt. With Cardiologist in order to receive future refills. Thank You. 1st Attempt. 11/07/21   Baldwin Jamaica, PA-C  Turmeric 500 MG CAPS Take 500 mg by mouth daily.    [provider]  Inpatient Medications: Scheduled Meds:  Continuous Infusions:  amiodarone 60 mg/hr (11/09/21 0513)   Followed by   amiodarone     PRN Meds:   Allergies:   No Known Allergies  Social History:   Social History   Socioeconomic History   Marital status: Single    Spouse name: Not on file   Number of children: Not on file   Years of education: Not on file   Highest education level: Not on file  Occupational History   Not on file  Tobacco Use   Smoking status: Never   Smokeless tobacco: Never  Substance and Sexual Activity   Alcohol use: No   Drug use: No   Sexual activity: Not on file  Other Topics Concern   Not on file  Social History Narrative   Work or School: Engineer, agricultural, Tax inspector      Home Situation: lives alone      Spiritual Beliefs: Christian      Lifestyle: 15 minutes dialy walking - working up; working on diet            Social Determinants of Radio broadcast assistant  Strain: Not on file  Food Insecurity: Not on file  Transportation Needs: Not on file  Physical Activity: Not on file  Stress: Stress Concern Present (06/23/2019)   [employer] [place]    Feeling of Stress : Rather much  Social Connections: Moderately Isolated (06/23/2019)   Social Connection and Isolation Panel [NHANES]    Frequency of Communication with Friends and Family: Three times a week    Frequency of Social Gatherings with Friends and Family: Three times a week    Attends Religious Services: 1 to 4 times per year    Active Member of Clubs or Organizations: No    Attends Archivist Meetings: Never    Marital Status: Never married  Human resources officer Violence: Not on file    Family History:    Family History  Problem Relation Age of Onset   Heart disease Father 70       MI   Hypertension Father    Hyperlipidemia Father    Diabetes Mother    Hypertension Mother      ROS:  Please see the history of present illness.   All other ROS reviewed and negative.     Physical Exam/Data:   Vitals:   11/09/21 0415 11/09/21 0430 11/09/21 0503 11/09/21 0505  BP:  (!) 142/129 (!) 157/111 (!) 157/111  Pulse: 70   70  Resp: (!) 23 (!) 22 (!) 22 (!) 25  Temp:      TempSrc:      SpO2: 98% 98%  98%  Weight:      Height:       No intake or output data in the 24 hours ending 11/09/21 0524    11/09/2021   12:34 AM 11/08/2020   10:04 AM 11/01/2020    3:08 PM  Last 3 Weights  Weight (lbs) 297 lb 9.9 oz 279 lb 295 lb 6.4 oz  Weight (kg) 135 kg 126.554 kg 133.993 kg     Body mass index is 41.51 kg/m.  General:  Well nourished, well developed, in no acute distress HEENT: normal Neck: JVD+++ Vascular: No carotid bruits; Distal pulses 2+ bilaterally Cardiac:  normal S1, S2; RRR; no murmur  Lungs:  crackles+, decreased breath sounds Abd: soft, nontender, no hepatomegaly , ascities+ Ext:2+ edema Musculoskeletal:  No  deformities, BUE and BLE strength normal and equal Skin: warm and dry  Neuro:  CNs 2-12 intact, no focal abnormalities noted Psych:  Normal affect     Laboratory Data:  High Sensitivity Troponin:   Recent Labs  Lab 11/09/21 0054 11/09/21 0256  TROPONINIHS 466* 349*     Chemistry Recent Labs  Lab 11/09/21 0054  NA 141  K 3.5  CL 106  CO2 22  GLUCOSE 198*  BUN 29*  CREATININE 2.52*  CALCIUM 8.5*  MG 1.9  GFRNONAA 30*  ANIONGAP 13    Recent Labs  Lab 11/09/21 0054  PROT 6.1*  ALBUMIN 3.3*  AST 39  ALT 51*  ALKPHOS 45  BILITOT 0.7   Lipids No results for input(s): "CHOL", "TRIG", "HDL", "LABVLDL", "LDLCALC", "CHOLHDL" in the last 168 hours.  Hematology Recent Labs  Lab 11/09/21 0054  WBC 8.0  RBC 4.85  HGB 14.3  HCT 44.2  MCV 91.1  MCH 29.5  MCHC 32.4  RDW 14.7  PLT 207   Thyroid No results for input(s): "TSH", "FREET4" in the last 168 hours.  BNP Recent Labs  Lab 11/09/21 0055  BNP 962.5*    DDimer No results for input(s): "DDIMER" in the last 168 hours.   Radiology/Studies:  DG Chest 2 View  Result Date: 11/09/2021 CLINICAL DATA:  Dyspnea EXAM: CHEST - 2 VIEW COMPARISON:  05/20/2019 FINDINGS: Cardiac shadow is enlarged. Mild vascular congestion is noted without significant interstitial edema. No focal infiltrate or effusion is noted. No bony abnormality is seen. IMPRESSION: Vascular congestion without edema. Electronically Signed   By: Inez Catalina M.D.   On: 11/09/2021 01:36     Assessment and Plan:   Acute decompensated HFrEF, EF20-25% (with recovered EF to 50% in 2020) Reported NICMP ([facility] in 2014- need to see the report) Paroxysmal afib (appears new) AKI on CKD 3b Obesity HTN, HLD, Dm-2 H/o non compliance and lost for follow up   Plan: - continue IV diuresis: lasix 80mg  BID, I/Os, goal UO 2-3lts  - given frequent runs of NSVT- he has been on IV amiodarone, would recommend d/c this as his ectopy is likely from CHF exacerbation and  electrolytes. Replace K and Mag  -he is also not anticoagulated for afib and we do not know the onset New onset paroxysmal afib: start eliquis 5mg  BID  - likely will need TEE DCCV and Antiarrhythmic to keep him in NSR, once euvolemic.  - GDMT: continue coreg at half dose (12.5mg  BID), hold on entresto, spironolactone and dapagliflozin until Cr improves and then start  - aggressive risk factor modification and compliance needed Screen of OSA - weight loss - will follow along.  Risk Assessment/Risk Scores:        New York Heart Association (NYHA) Functional Class NYHA Class III        For questions or updates, please contact [facility] Please consult [email or web address] for contact info under    Signed, Renae Fickle, MD  11/09/2021 5:24 AM

## 2021-11-10 ENCOUNTER — Other Ambulatory Visit (HOSPITAL_COMMUNITY): Payer: Self-pay

## 2021-11-10 DIAGNOSIS — I5021 Acute systolic (congestive) heart failure: Secondary | ICD-10-CM | POA: Diagnosis not present

## 2021-11-10 LAB — BASIC METABOLIC PANEL
Anion gap: 11 (ref 5–15)
BUN: 30 mg/dL — ABNORMAL HIGH (ref 6–20)
CO2: 21 mmol/L — ABNORMAL LOW (ref 22–32)
Calcium: 8.3 mg/dL — ABNORMAL LOW (ref 8.9–10.3)
Chloride: 105 mmol/L (ref 98–111)
Creatinine, Ser: 2.34 mg/dL — ABNORMAL HIGH (ref 0.61–1.24)
GFR, Estimated: 33 mL/min — ABNORMAL LOW (ref >60)
Glucose, Bld: 210 mg/dL — ABNORMAL HIGH (ref 70–99)
Potassium: 3.4 mmol/L — ABNORMAL LOW (ref 3.5–5.1)
Sodium: 137 mmol/L (ref 135–145)

## 2021-11-10 LAB — FERRITIN: Ferritin: 84 ng/mL (ref 24–336)

## 2021-11-10 LAB — IRON AND TIBC
Iron: 48 ug/dL (ref 45–182)
Saturation Ratios: 11 % — ABNORMAL LOW (ref 17.9–39.5)
TIBC: 427 ug/dL (ref 250–450)
UIBC: 379 ug/dL

## 2021-11-10 LAB — FOLATE: Folate: 14.6 ng/mL (ref >5.9)

## 2021-11-10 LAB — VITAMIN B12: Vitamin B-12: 329 pg/mL (ref 180–914)

## 2021-11-10 LAB — RETICULOCYTES
Immature Retic Fract: 30.6 % — ABNORMAL HIGH (ref 2.3–15.9)
RBC.: 4.9 MIL/uL (ref 4.22–5.81)
Retic Count, Absolute: 139.7 10*3/uL (ref 19.0–186.0)
Retic Ct Pct: 2.9 % (ref 0.4–3.1)

## 2021-11-10 MED ORDER — FUROSEMIDE 10 MG/ML IJ SOLN
120.0000 mg | Freq: Two times a day (BID) | INTRAMUSCULAR | Status: DC
Start: 2021-11-10 — End: 2021-11-11
  Administered 2021-11-10 (×2): 120 mg via INTRAVENOUS
  Filled 2021-11-10 (×2): qty 2
  Filled 2021-11-10 (×2): qty 12

## 2021-11-10 MED ORDER — POTASSIUM CHLORIDE CRYS ER 20 MEQ PO TBCR
40.0000 meq | EXTENDED_RELEASE_TABLET | Freq: Once | ORAL | Status: AC
Start: 2021-11-10 — End: 2021-11-10
  Administered 2021-11-10: 40 meq via ORAL
  Filled 2021-11-10: qty 2

## 2021-11-10 MED ORDER — CARVEDILOL 12.5 MG PO TABS
12.5000 mg | ORAL_TABLET | Freq: Two times a day (BID) | ORAL | Status: DC
  Administered 2021-11-10 – 2021-11-14 (×8): 12.5 mg via ORAL
  Filled 2021-11-10 (×8): qty 1

## 2021-11-10 MED ORDER — POTASSIUM CHLORIDE 20 MEQ PO PACK
40.0000 meq | PACK | Freq: Every day | ORAL | Status: DC
  Administered 2021-11-10 – 2021-11-11 (×2): 40 meq via ORAL
  Filled 2021-11-10 (×2): qty 2

## 2021-11-10 NOTE — Progress Notes (Signed)
Patient's MEWs turned yellow due to diastolic pressures and HR. Patient is in a fib with uncontrolled rate, especially with exercise, such as walking to the bathroom. Trending diastolic back to yesterday morning, it is noted diastolic pressures running high has been the patient's baseline here. Hydralazine prn with parameters in MAR and available for use.  Will continue to monitor closely and use hydralazine as needed.    11/10/21 0810  Assess: MEWS Score  Temp 97.7 F (36.5 C)  BP (!) 140/123  MAP (mmHg) 130  Pulse Rate 87  ECG Heart Rate (!) 111  Resp 20  Level of Consciousness Alert  SpO2 98 %  O2 Device Room Air  Assess: MEWS Score  MEWS Temp 0  MEWS Systolic 0  MEWS Pulse 2  MEWS RR 0  MEWS LOC 0  MEWS Score 2  MEWS Score Color Yellow  Assess: if the MEWS score is Yellow or Red  Were vital signs taken at a resting state? Yes  Focused Assessment No change from prior assessment  Does the patient meet 2 or more of the SIRS criteria? Yes  Does the patient have a confirmed or suspected source of infection? No  MEWS guidelines implemented *See Row Information* Yes  Treat  Pain Scale 0-10  Pain Score 0  Take Vital Signs  Increase Vital Sign Frequency  Yellow: Q 2hr X 2 then Q 4hr X 2, if remains yellow, continue Q 4hrs  Escalate  MEWS: Escalate Yellow: discuss with charge nurse/RN and consider discussing with provider and RRT  Notify: Charge Nurse/RN  Name of Charge Nurse/RN Notified Dianna, RN  Date Charge Nurse/RN Notified 11/10/21  Time Charge Nurse/RN Notified 1940  Assess: SIRS CRITERIA  SIRS Temperature  0  SIRS Pulse 1  SIRS Respirations  0  SIRS WBC 0  SIRS Score Sum  1

## 2021-11-10 NOTE — Progress Notes (Signed)
Patient's diastolic bp over 078 (675/449 and 126/107 back to back). Hydralazine given. Patient does not feel any different or worse than he has and has no new complaints. Assessment remains unchanged. Hydralazine given. Recheck blood pressure 138/79. It is also noted at this time, HR is slowing down. Still remains in a. Fib, rates 90-110.  Continue to monitor.

## 2021-11-10 NOTE — TOC Initial Note (Addendum)
Transition of Care [facility]) - Initial/Assessment Note    Patient Details  Name: Richard [name] MRN: [id number] Date of Birth: 1970-10-08  [facility]) CM/SW Contact:    Erenest Rasher, RN Phone Number: [phone] 11/10/2021, 2:09 PM  Clinical Narrative:                  HF TOC CM spoke to pt and states he works full-time. Provided pt with Wilder Glade free trial card and $0 copay card, Entresto 30 day free trial and $ copay card, and Eliquis 30 day free trial card and Copay card. Pt states he will need note for work. [facility] [place].    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    Expected Discharge Plan: Home/Self Care Barriers to Discharge: Continued Medical Work up   Patient Goals and CMS Choice Patient states their goals for this hospitalization and ongoing recovery are:: wants to be able to return to work      Expected Discharge Plan and Services Expected Discharge Plan: Home/Self Care   Discharge Planning Services: CM Consult, Medication Assistance   Living arrangements for the past 2 months: Apartment                                      Prior Living Arrangements/Services Living arrangements for the past 2 months: Apartment Lives with:: Self Patient language and need for interpreter reviewed:: Yes Do you feel safe going back to the place where you live?: Yes      Need for Family Participation in Patient Care: No (Comment) Care giver support system in place?: No (comment)   Criminal Activity/Legal Involvement Pertinent to Current Situation/Hospitalization: No - Comment as needed  Activities of Daily Living Home Assistive Devices/Equipment: None ADL Screening (condition at time of admission) Patient's cognitive ability adequate to safely complete daily activities?: Yes Is the patient deaf or have difficulty hearing?: No Does the patient have difficulty  seeing, even when wearing glasses/contacts?: No Does the patient have difficulty concentrating, remembering, or making decisions?: No Patient able to express need for assistance with ADLs?: Yes Does the patient have difficulty dressing or bathing?: No Independently performs ADLs?: Yes (appropriate for developmental age) Does the patient have difficulty walking or climbing stairs?: No Weakness of Legs: None Weakness of Arms/Hands: None  Permission Sought/Granted Permission sought to share information with : Case Manager, Family Supports, PCP Permission granted to share information with : Yes, Verbal Permission Granted  Share Information with NAME: Jermal Dismuke     Permission granted to share info w Relationship: mother  Permission granted to share info w Contact Information: [phone]  Emotional Assessment Appearance:: Appears stated age Attitude/Demeanor/Rapport: Engaged Affect (typically observed): Accepting Orientation: : Oriented to Self, Oriented to Place, Oriented to  Time, Oriented to Situation   Psych Involvement: No (comment)  Admission diagnosis:  Acute systolic heart failure (HCC) [O17.51] Acute systolic CHF (congestive heart failure) (HCC) [I50.21] Acute renal failure superimposed on chronic kidney disease, unspecified CKD stage, unspecified acute renal failure type ([place]) [N17.9, N18.9] Heart failure, unspecified HF chronicity, unspecified heart failure type ([place]) [I50.9] Patient Active Problem List   Diagnosis Date Noted   Acute systolic CHF (congestive heart failure) ([place]) 02/58/5277   Acute systolic heart failure ([place]) 11/09/2021   HTN (hypertension) 11/09/2021  New onset atrial fibrillation ([place]) 11/09/2021   AKI (acute kidney injury) ([place]) 11/09/2021   HFrEF (heart failure with reduced ejection fraction) ([place]) 11/08/2020   History of CVA (cerebrovascular accident) 11/08/2020   Obesity, diabetes, and hypertension syndrome ([place]) 11/08/2020   Hypertensive  urgency 05/20/2019   Chronic combined systolic and diastolic CHF, NYHA class 2 ([place]) 10/06/2015   Stage 3b chronic kidney disease ([place]) 10/06/2015   Obesity    Disorder associated with well-controlled type 2 diabetes mellitus ([place]) 02/17/2013   PCP:  Maximiano Coss, NP Pharmacy:   [place], [place] - [address] [place] [address] Phone: [phone] Fax: [phone]  [id number] [place] [place] - [address] [place] [address] Phone: [phone] Fax: [phone]     Social Determinants of Health (SDOH) Interventions    Readmission Risk Interventions     No data to display

## 2021-11-10 NOTE — Progress Notes (Signed)
Nutrition Brief Note  Patient identified on the Malnutrition Screening Tool (MST) Report  Wt Readings from Last 15 Encounters:  11/10/21 (!) 144.4 kg  11/08/20 126.6 kg  11/01/20 134 kg  07/21/19 125.2 kg  07/13/19 126.1 kg  07/08/19 124.4 kg  06/17/19 129.3 kg  06/10/19 127.2 kg  05/22/19 113.4 kg  04/30/17 121.2 kg  04/12/17 127 kg  02/27/17 (!) 142.9 kg  01/28/17 (!) 142.9 kg  03/15/16 126.1 kg  02/14/16 125.5 kg   Current diet order is heart healthy/carb modified, patient is consuming approximately 100% of meals at this time. Pt reports eating well currently and prior to admission with no difficulties. Pt reports following a low sodium diet at home. Labs and medications reviewed.   No nutrition interventions warranted at this time. If nutrition issues arise, please consult RD.   Corrin Parker, MS, RD, LDN RD pager number/after hours weekend pager number on Amion.

## 2021-11-10 NOTE — Progress Notes (Signed)
Heart Failure Stewardship Pharmacist Progress Note   PCP: Maximiano Coss, NP PCP-Cardiologist: Werner Lean, MD    HPI:  51 y.o. male who presented after 3 weeks of lower extremity edema that has migrated to the abdomen.  This was associated with dyspnea on exertion.  He stated he has not been watching his fluid intake as carefully as he should've been recently. Prior to admission patient with known history of heart failure  (EF 20-25% in 05/2019) and was on Lasix, Entresto and spironolactone.  In the past patient has been unable to follow-up with primary cardiologist due to insurance concerns.  He states Delene Loll was very expensive but he has been taking this as ordered. He has a few days left.   In the ER patient was found to have acute kidney injury, clinical signs consistent with acute heart failure noted 2 view chest x-ray revealed vascular congestion without edema.  BNP was elevated greater than 900 and troponin elevated which is consistent with likely demand ischemia.   Patient was found to have new onset atrial fib with RVR. Cardiology was consulted.  Rate control EDP had ordered IV amiodarone but this was discontinued by the cardiology physician.  Home carvedilol was continued but at a lower dose.    Patient had a prior amyloid work-up which was negative and a cath in 2014 that was negative for CAD. 11/09/2021 echo with EF 25-30%, RV normal.   Current HF Medications: Diuretic: furosemide 120 mg IV BID Beta Blocker: carvedilol 12.5 mg BID ACE/ARB/ARNI: none Aldosterone Antagonist: none SGLT2i: none Other: Kcl 40 mEq x1  Prior to admission HF Medications: Diuretic: furosemide 40 mg daily Beta blocker: carvedilol 25 mg BID ACE/ARB/ARNI: Entresto 24/26 mg BID Aldosterone Antagonist: Spironolactone 12.5 mg daily SGLT2i: none Other: Kcl 40 mEq BID Dispense report indicates recent fills of all medications above except Entresto.   Pertinent Lab Values: Serum creatinine  2.34, BUN 30, Potassium 3.4, Sodium 137, BNP 962.5, Magnesium 1.9, A1c 6.4%  Vital Signs: Weight: 318 lbs (admission weight: 322 lbs) Blood pressure: 140-150/100-120 Heart rate: 105 (AF) I/O: 1.8 L UOP/24h, net -700 mL  Medication Assistance / Insurance Benefits Check: Does the patient have prescription insurance?  Yes Type of insurance plan: Nurse, learning disability  Outpatient Pharmacy:  [facility] the [facility] willing to use [facility] at discharge? Pending Is the patient willing to transition their outpatient pharmacy to utilize a [facility] outpatient pharmacy?   Pending   Assessment: 1. Acute on chronic systolic CHF (LVEF 40-97% in 05/2019), due to NICM. NYHA class II-III symptoms. - Scr 2.34, down from 2.52 with diuresis, BL appears to be 1.62. Significant AKI, but improving. Agree with holding home Entresto and spironolactone. BP elevated.  - Severely volume overloaded. Agree with increase in furosemide to 120 mg IV BID and assess response as 1.8 L UOP with lower dose regimen. Goal net negative 2L/24 hours per cardiology.  -Agree with increase of carvedilol for rate control of AF with HR of > 100.  -A1c 6.4%. New diagnosis of DM. Start Farxiga 10 mg daily. -Lactic acid 1.9, persistent low EF, may need RHC after more evolemic to assess hemodynamics -Keep K >4, Mg >2   Plan: 1) Medication changes recommended at this time: -Give additional Kcl 41mEq x1 -Start Digoxin 0.125 mg daily -Start Farxiga 10 mg daily  -Consider Lifevest at discharge with persistently low EF -RHC this admission to assess hemodymaics  2) Patient assistance: -Need  to determine insurance deductible -Eliquis- patient can use copay cards, however will only cover cost for 9 months a year, will likely need 3 months of samples -Entresto- will need to complete patient assistance if not already done. Per notes application was done for 2023, but new insurance card  and tax forms were not sent in. -Farxiga- PA approved - cna use copay cards Calling insurance to determine if copays cheaper at specific pharmacies/how much remaining on deductible  3)  Education  - To be completed prior to discharge   Cathrine Muster, PharmD, [facility] PGY2 Cardiology Pharmacy Resident

## 2021-11-10 NOTE — Progress Notes (Addendum)
Progress Note  Patient Name: Richard [name] Date of Encounter: 11/10/2021  [facility] Cardiologist: Werner Lean, MD   Subjective   Richard [name] slept well. He remains on room air. Notes abdomen still has tightness.   Inpatient Medications    Scheduled Meds:  apixaban  5 mg Oral BID   carvedilol  6.25 mg Oral BID WC   furosemide  80 mg Intravenous BID   magnesium oxide  800 mg Oral Daily   potassium chloride  40 mEq Oral Daily   sodium chloride flush  3 mL Intravenous Q12H   Continuous Infusions:  sodium chloride     PRN Meds:    Vital Signs    Vitals:   11/09/21 1931 11/10/21 0012 11/10/21 0420 11/10/21 0810  BP: (!) 128/92 (!) 132/99 124/85 (!) 140/123  Pulse:  73  87  Resp: 19 20  20   Temp: 97.9 F (36.6 C) 98.1 F (36.7 C) 97.9 F (36.6 C) 97.7 F (36.5 C)  TempSrc: Oral Oral Oral Oral  SpO2: 100% 97%  98%  Weight:   (!) 144.4 kg   Height:        Intake/Output Summary (Last 24 hours) at 11/10/2021 0826 Last data filed at 11/10/2021 1610 Gross per 24 hour  Intake 1557 ml  Output 2325 ml  Net -768 ml      11/10/2021    4:20 AM 11/09/2021   11:43 AM 11/09/2021   12:34 AM  Last 3 Weights  Weight (lbs) 318 lb 4.8 oz 322 lb 8.5 oz 297 lb 9.9 oz  Weight (kg) 144.38 kg 146.3 kg 135 kg      Telemetry    Atrial fibrillation sporadic rate control, bi-directional PVCs - Personally Reviewed  ECG    NA - Personally Reviewed  Physical Exam   Vitals:   11/10/21 0420 11/10/21 0810  BP: 124/85 (!) 140/123  Pulse:  87  Resp:  20  Temp: 97.9 F (36.6 C) 97.7 F (36.5 C)  SpO2:  98%    GEN: No acute distress.   Neck: mild JVD sitting at 90 degrees Cardiac: irregular rate and rhythm, no murmurs, rubs, or gallops.  Respiratory: nl wob,  GI: distended MS: No LE edema; No deformity. Neuro:  Nonfocal  Psych: Normal affect   Labs    High Sensitivity Troponin:   Recent Labs  Lab 11/09/21 0054 11/09/21 0256  TROPONINIHS 466* 349*      Chemistry Recent Labs  Lab 11/09/21 0054 11/09/21 1520  NA 141 141  K 3.5 3.7  CL 106 107  CO2 22 24  GLUCOSE 198* 201*  BUN 29* 31*  CREATININE 2.52* 2.51*  CALCIUM 8.5* 8.4*  MG 1.9  --   PROT 6.1* 6.0*  ALBUMIN 3.3* 3.2*  AST 39 37  ALT 51* 53*  ALKPHOS 45 38  BILITOT 0.7 0.7  GFRNONAA 30* 30*  ANIONGAP 13 10    Lipids No results for input(s): "CHOL", "TRIG", "HDL", "LABVLDL", "LDLCALC", "CHOLHDL" in the last 168 hours.  Hematology Recent Labs  Lab 11/09/21 0054  WBC 8.0  RBC 4.85  HGB 14.3  HCT 44.2  MCV 91.1  MCH 29.5  MCHC 32.4  RDW 14.7  PLT 207   Thyroid  Recent Labs  Lab 11/09/21 1520  TSH 1.293    BNP Recent Labs  Lab 11/09/21 0055  BNP 962.5*    DDimer No results for input(s): "DDIMER" in the last 168 hours.   Radiology    ECHOCARDIOGRAM  COMPLETE  Result Date: 11/09/2021    ECHOCARDIOGRAM REPORT   Patient Name:   Richard [name] Date of Exam: 11/09/2021 Medical Rec #:  [id number]     Height:       71.0 in Accession #:    [id number]    Weight:       322.5 lb Date of Birth:  November 14, 1970     BSA:          2.583 m Patient Age:    51 years      BP:           147/100 mmHg Patient Gender: M             HR:           100 bpm. Exam Location:  Inpatient Procedure: 2D Echo, Cardiac Doppler, Color Doppler and Intracardiac            Opacification Agent Indications:    CHF  History:        Patient has prior history of Echocardiogram examinations, most                 recent 05/20/2019. CHF, Stroke; Risk Factors:Hypertension and                 Diabetes.  Sonographer:    Luisa Hart RDCS Referring Phys: [id number] ALLISON L ELLIS  Sonographer Comments: Technically difficult study due to poor echo windows and patient is morbidly obese. IMPRESSIONS  1. Left ventricular ejection fraction, by estimation, is 25 to 30%. The left ventricle has severely decreased function. The left ventricle demonstrates global hypokinesis. The left ventricular internal cavity size was mildly  dilated. Left ventricular diastolic function could not be evaluated.  2. Right ventricular systolic function is normal. The right ventricular size is normal. There is moderately elevated pulmonary artery systolic pressure.  3. Left atrial size was mildly dilated.  4. The mitral valve is normal in structure. Trivial mitral valve regurgitation. No evidence of mitral stenosis.  5. The aortic valve is normal in structure. Aortic valve regurgitation is not visualized. No aortic stenosis is present.  6. The inferior vena cava is dilated in size with <50% respiratory variability, suggesting right atrial pressure of 15 mmHg. FINDINGS  Left Ventricle: Left ventricular ejection fraction, by estimation, is 25 to 30%. The left ventricle has severely decreased function. The left ventricle demonstrates global hypokinesis. The left ventricular internal cavity size was mildly dilated. There is no left ventricular hypertrophy. Left ventricular diastolic function could not be evaluated due to atrial fibrillation. Left ventricular diastolic function could not be evaluated.  LV Wall Scoring: The entire apex and mid inferoseptal segment are akinetic. The anterior wall, antero-lateral wall, anterior septum, inferior wall, posterior wall, and basal inferoseptal segment are hypokinetic. Right Ventricle: The right ventricular size is normal. No increase in right ventricular wall thickness. Right ventricular systolic function is normal. There is moderately elevated pulmonary artery systolic pressure. The tricuspid regurgitant velocity is 3.26 m/s, and with an assumed right atrial pressure of 15 mmHg, the estimated right ventricular systolic pressure is 95.2 mmHg. Left Atrium: Left atrial size was mildly dilated. Right Atrium: Right atrial size was normal in size. Pericardium: There is no evidence of pericardial effusion. Mitral Valve: The mitral valve is normal in structure. Trivial mitral valve regurgitation. No evidence of mitral valve  stenosis. Tricuspid Valve: The tricuspid valve is normal in structure. Tricuspid valve regurgitation is mild . No evidence of tricuspid stenosis. Aortic Valve: The aortic valve  is normal in structure. Aortic valve regurgitation is not visualized. No aortic stenosis is present. Aortic valve mean gradient measures 2.0 mmHg. Aortic valve peak gradient measures 4.0 mmHg. Aortic valve area, by VTI measures 1.21 cm. Pulmonic Valve: The pulmonic valve was normal in structure. Pulmonic valve regurgitation is trivial. No evidence of pulmonic stenosis. Aorta: The aortic root is normal in size and structure. Venous: The inferior vena cava is dilated in size with less than 50% respiratory variability, suggesting right atrial pressure of 15 mmHg. IAS/Shunts: No atrial level shunt detected by color flow Doppler.  LEFT VENTRICLE PLAX 2D LVIDd:         6.00 cm LVIDs:         5.10 cm LV PW:         1.10 cm LV IVS:        1.00 cm LVOT diam:     2.00 cm LV SV:         19 LV SV Index:   7 LVOT Area:     3.14 cm  RIGHT VENTRICLE RV Basal diam:  3.70 cm RV Mid diam:    2.70 cm TAPSE (M-mode): 1.7 cm LEFT ATRIUM             Index        RIGHT ATRIUM           Index LA diam:        4.40 cm 1.70 cm/m   RA Area:     21.40 cm LA Vol (A2C):   81.1 ml 31.39 ml/m  RA Volume:   64.10 ml  24.81 ml/m LA Vol (A4C):   52.1 ml 20.17 ml/m LA Biplane Vol: 65.3 ml 25.28 ml/m  AORTIC VALVE                    PULMONIC VALVE AV Area (Vmax):    1.52 cm     PV Vmax:          0.63 m/s AV Area (Vmean):   1.53 cm     PV Vmean:         41.900 cm/s AV Area (VTI):     1.21 cm     PV VTI:           0.106 m AV Vmax:           100.00 cm/s  PV Peak grad:     1.6 mmHg AV Vmean:          65.800 cm/s  PV Mean grad:     1.0 mmHg AV VTI:            0.159 m      PR End Diast Vel: 3.21 msec AV Peak Grad:      4.0 mmHg AV Mean Grad:      2.0 mmHg LVOT Vmax:         48.30 cm/s LVOT Vmean:        31.960 cm/s LVOT VTI:          0.061 m LVOT/AV VTI ratio: 0.38  AORTA Ao  Root diam: 3.20 cm Ao Asc diam:  3.10 cm MITRAL VALVE               TRICUSPID VALVE MV Area (PHT): 5.37 cm    TR Peak grad:   42.5 mmHg MV E velocity: 88.10 cm/s  TR Vmax:        326.00 cm/s  SHUNTS                            Systemic VTI:  0.06 m                            Systemic Diam: 2.00 cm Skeet Latch MD Electronically signed by Skeet Latch MD Signature Date/Time: 11/09/2021/5:39:38 PM    Final    DG Chest 2 View  Result Date: 11/09/2021 CLINICAL DATA:  Dyspnea EXAM: CHEST - 2 VIEW COMPARISON:  05/20/2019 FINDINGS: Cardiac shadow is enlarged. Mild vascular congestion is noted without significant interstitial edema. No focal infiltrate or effusion is noted. No bony abnormality is seen. IMPRESSION: Vascular congestion without edema. Electronically Signed   By: Inez Catalina M.D.   On: 11/09/2021 01:36    Cardiac Studies   TTE 11/09/2021  1. Left ventricular ejection fraction, by estimation, is 25 to 30%. The  left ventricle has severely decreased function. The left ventricle  demonstrates global hypokinesis. The left ventricular internal cavity size  was mildly dilated. Left ventricular  diastolic function could not be evaluated.   2. Right ventricular systolic function is normal. The right ventricular  size is normal. There is moderately elevated pulmonary artery systolic  pressure.   3. Left atrial size was mildly dilated.   4. The mitral valve is normal in structure. Trivial mitral valve  regurgitation. No evidence of mitral stenosis.   5. The aortic valve is normal in structure. Aortic valve regurgitation is  not visualized. No aortic stenosis is present.   6. The inferior vena cava is dilated in size with <50% respiratory  variability, suggesting right atrial pressure of 15 mmHg.   Transthoracic Echocardiogram: Date: 05/20/2019 Results:  1. Left ventricular ejection fraction, by visual estimation, is 50 to  55%. The left ventricle has low normal  function. There is moderately  increased left ventricular hypertrophy.   2. Indeterminate diastolic filling due to E-A fusion.   3. The left ventricle has no regional wall motion abnormalities.   4. Global right ventricle has low normal systolic function.The right  ventricular size is normal. No increase in right ventricular wall  thickness.   5. Left atrial size was mildly dilated.   6. Right atrial size was mildly dilated.   7. Trivial pericardial effusion is present.   8. The mitral valve is grossly normal. Mild mitral valve regurgitation.   9. The tricuspid valve is grossly normal. Tricuspid valve regurgitation  is mild.  10. The aortic valve is tricuspid. Aortic valve regurgitation is not  visualized. Mild aortic valve sclerosis without stenosis.  11. The pulmonic valve was grossly normal. Pulmonic valve regurgitation is  not visualized.  12. Mildly elevated pulmonary artery systolic pressure.  13. The tricuspid regurgitant velocity is 3.06 m/s, and with an assumed  right atrial pressure of 3 mmHg, the estimated right ventricular systolic  pressure is mildly elevated at 40.5 mmHg.  14. The inferior vena cava is normal in size with greater than 50%  respiratory variability, suggesting right atrial pressure of 3 mmHg.  15. A prior study was performed on 03/15/2016.  16. No significant change from prior study.     Patient Profile     Richard [name] is a 51 y.o. male with a hx of  NICM and HFrEF (EF 20-25%), 2/21 Stroke with prior heart monitoring (loop deferred), h/o non compliance, DM-2, CKD stage 3,  obesity who is being seen 11/09/2021 for the evaluation of ac decompensated heart failure at the request of Dr Dayna Barker.  Assessment & Plan    #NICM NYHA Class II-III Stage C HFpeF: EF prior 20-25% with EF improvement to mildlly reduced in 2017. Had work up for amyloid which was negative.  He had cath in 2014 that showed no obstruction.  This was in the setting of poorly controlled HTN.  He notes challenges with entresto 2/2 cost. Decompensation in the setting of job transitions and loss of insurance and inability to be consistent with his medications and follow-up. Here he has elevated BNP 962, congestion on his xray and gut edema. Troponin elevation likely in the setting of CHF. - warm and wet; his EF has declined once again - wt  146 kg-> 144 kg ( has not been seen in awhile, no recent dry weight) - crt 1.62 ( 11/18/2020)-> 2.52->2.51; net negative 888 - lactate 1.9 6/8 - increasing his lasix to 120 mg IV BID (from 80 mg BID) -K>4, Mg>2 - hold home entresto and home spironolactone, SGLT2 with significant AKI - recommend GDMT medication assistance  #Paroxysmal atrial fibrillation: new onset in the setting of decompensated NICM heart failure. He is initiated on eliquis.  Will consider TEE/DCCV if he does not convert to sinus rhythm once he is closer to euvolemia. Will start back lower dose home coreg and uptitrate  #Prior CVA: stable. He had TEE in 2020 negative for PFO.  For questions or updates, please contact [place] Please consult [email or web address] for contact info under        Signed, Janina Mayo, MD  11/10/2021, 8:26 AM

## 2021-11-10 NOTE — Progress Notes (Signed)
PROGRESS NOTE    Richard [name]  QJF:[id number] DOB: [birth year] DOA: 11/09/2021 PCP: Richard Coss, NP  51 y.o. male with a hx of  NICM and HFrEF (EF 20-25%), CVA, type 2 diabetes mellitus, CKD 3a, obesity presented to the ED with worsening shortness of breath, orthopnea and abdominal swelling, BNP was 962, troponin 466, chest x-ray with pulmonary vascular congestion, also noted to be in new onset A-fib with RVR,   Subjective: -Breathing starting to improve, still short of breath with with any activity,  Assessment and Plan:  Acute combined systolic and diastolic heart failure -EF was previously 20-25%, subsequently improved and then reduced again in 2017, cath in 2014 was negative for obstruction, -Inconsistent medication use, in the setting of losing insurance in between -Repeat echo with EF 25-30%, RV function is normal, moderate PAH -Cardiology following, Lasix increased to 120 Mg twice daily -GDMT limited by AKI/CKD -Monitor weights, kidney function -Would benefit from [facility] clinic follow-up  New onset atrial fib with RVR -New, heart rate suboptimally controlled, continue carvedilol -Started on Eliquis, cards planning TEE/DCCV if he does not convert to NSR once volume status has improved   Acute kidney injury on stage IIIb chronic kidney disease Baseline creatinine 1.62 with current creatinine 2.54 -Suspected cardiorenal component, monitor with diuresis -Avoid hypotension   Hypertension -Improving, continue Coreg and diuretics  History of CVA Aspirin on hold, now started on Eliquis   Prediabetes Hemoglobin A1c in 2017 was 8.7.  Most recent was 2021 and down to 5.5   Obesity Counseling for weight reduction recommended in the outpatient Body mass index is 41.51 kg/m.     DVT prophylaxis: Eliquis Code Status: Full code Family Communication: Discussed patient detail, no family at bedside Disposition Plan: Home  Consultants: Cards   Procedures:   Antimicrobials:     Objective: Vitals:   11/10/21 0012 11/10/21 0420 11/10/21 0810 11/10/21 1030  BP: (!) 132/99 124/85 (!) 140/123 (!) 150/102  Pulse: 73  87 (!) 120  Resp: 20  20 18   Temp: 98.1 F (36.7 C) 97.9 F (36.6 C) 97.7 F (36.5 C) 97.7 F (36.5 C)  TempSrc: Oral Oral Oral Oral  SpO2: 97%  98% 100%  Weight:  (!) 144.4 kg    Height:        Intake/Output Summary (Last 24 hours) at 11/10/2021 1100 Last data filed at 11/10/2021 1032 Gross per 24 hour  Intake 1734 ml  Output 2950 ml  Net -1216 ml   Filed Weights   11/09/21 0034 11/09/21 1143 11/10/21 0420  Weight: 135 kg (!) 146.3 kg (!) 144.4 kg    Examination:  General exam: Pleasant male sitting up in the recliner, AAOx3, no distress HEENT: Positive JVD CVS: S1-S2, irregularly irregular rhythm Lungs: Decreased breath sounds the bases otherwise clear Abdomen: Soft, obese, mildly distended, abdominal wall edema Extremities: Trace edema  Skin: No rashes Psychiatry:  Mood & affect appropriate.     Data Reviewed:   CBC: Recent Labs  Lab 11/09/21 0054  WBC 8.0  NEUTROABS 4.8  HGB 14.3  HCT 44.2  MCV 91.1  PLT 893   Basic Metabolic Panel: Recent Labs  Lab 11/09/21 0054 11/09/21 1520 11/10/21 0845  NA 141 141 137  K 3.5 3.7 3.4*  CL 106 107 105  CO2 22 24 21*  GLUCOSE 198* 201* 210*  BUN 29* 31* 30*  CREATININE 2.52* 2.51* 2.34*  CALCIUM 8.5* 8.4* 8.3*  MG 1.9  --   --    GFR: Estimated  Creatinine Clearance: 54.4 mL/min (A) (by C-G formula based on SCr of 2.34 mg/dL (H)). Liver Function Tests: Recent Labs  Lab 11/09/21 0054 11/09/21 1520  AST 39 37  ALT 51* 53*  ALKPHOS 45 38  BILITOT 0.7 0.7  PROT 6.1* 6.0*  ALBUMIN 3.3* 3.2*   No results for input(s): "LIPASE", "AMYLASE" in the last 168 hours. No results for input(s): "AMMONIA" in the last 168 hours. Coagulation Profile: No results for input(s): "INR", "PROTIME" in the last 168 hours. Cardiac Enzymes: No results for input(s): "CKTOTAL",  "CKMB", "CKMBINDEX", "TROPONINI" in the last 168 hours. BNP (last 3 results) No results for input(s): "PROBNP" in the last 8760 hours. HbA1C: Recent Labs    11/09/21 1520  HGBA1C 6.4*   CBG: No results for input(s): "GLUCAP" in the last 168 hours. Lipid Profile: No results for input(s): "CHOL", "HDL", "LDLCALC", "TRIG", "CHOLHDL", "LDLDIRECT" in the last 72 hours. Thyroid Function Tests: Recent Labs    11/09/21 1520  TSH 1.293   Anemia Panel: Recent Labs    11/10/21 0845  RETICCTPCT 2.9   Urine analysis:    Component Value Date/Time   APPEARANCEUR Clear 06/10/2019 1014   GLUCOSEU Negative 06/10/2019 1014   BILIRUBINUR Negative 06/10/2019 1014   PROTEINUR 2+ (A) 06/10/2019 1014   UROBILINOGEN 1.0 01/03/2013 1809   NITRITE Negative 06/10/2019 1014   LEUKOCYTESUR Negative 06/10/2019 1014   Sepsis Labs: @LABRCNTIP (procalcitonin:4,lacticidven:4)  )No results found for this or any previous visit (from the past 240 hour(s)).   Radiology Studies: ECHOCARDIOGRAM COMPLETE  Result Date: 11/09/2021    ECHOCARDIOGRAM REPORT   Patient Name:   Richard [name] Date of Exam: 11/09/2021 Medical Rec #:  [id number]     Height:       71.0 in Accession #:    [id number]    Weight:       322.5 lb Date of Birth:  [birth year]     BSA:          2.583 m Patient Age:    18 years      BP:           147/100 mmHg Patient Gender: M             HR:           100 bpm. Exam Location:  Inpatient Procedure: 2D Echo, Cardiac Doppler, Color Doppler and Intracardiac            Opacification Agent Indications:    CHF  History:        Patient has prior history of Echocardiogram examinations, most                 recent 05/20/2019. CHF, Stroke; Risk Factors:Hypertension and                 Diabetes.  Sonographer:    Luisa Hart RDCS Referring Phys: [id number] ALLISON L ELLIS  Sonographer Comments: Technically difficult study due to poor echo windows and patient is morbidly obese. IMPRESSIONS  1. Left ventricular ejection  fraction, by estimation, is 25 to 30%. The left ventricle has severely decreased function. The left ventricle demonstrates global hypokinesis. The left ventricular internal cavity size was mildly dilated. Left ventricular diastolic function could not be evaluated.  2. Right ventricular systolic function is normal. The right ventricular size is normal. There is moderately elevated pulmonary artery systolic pressure.  3. Left atrial size was mildly dilated.  4. The mitral valve is normal in structure. Trivial mitral valve regurgitation. No  evidence of mitral stenosis.  5. The aortic valve is normal in structure. Aortic valve regurgitation is not visualized. No aortic stenosis is present.  6. The inferior vena cava is dilated in size with <50% respiratory variability, suggesting right atrial pressure of 15 mmHg. FINDINGS  Left Ventricle: Left ventricular ejection fraction, by estimation, is 25 to 30%. The left ventricle has severely decreased function. The left ventricle demonstrates global hypokinesis. The left ventricular internal cavity size was mildly dilated. There is no left ventricular hypertrophy. Left ventricular diastolic function could not be evaluated due to atrial fibrillation. Left ventricular diastolic function could not be evaluated.  LV Wall Scoring: The entire apex and mid inferoseptal segment are akinetic. The anterior wall, antero-lateral wall, anterior septum, inferior wall, posterior wall, and basal inferoseptal segment are hypokinetic. Right Ventricle: The right ventricular size is normal. No increase in right ventricular wall thickness. Right ventricular systolic function is normal. There is moderately elevated pulmonary artery systolic pressure. The tricuspid regurgitant velocity is 3.26 m/s, and with an assumed right atrial pressure of 15 mmHg, the estimated right ventricular systolic pressure is 78.5 mmHg. Left Atrium: Left atrial size was mildly dilated. Right Atrium: Right atrial size was  normal in size. Pericardium: There is no evidence of pericardial effusion. Mitral Valve: The mitral valve is normal in structure. Trivial mitral valve regurgitation. No evidence of mitral valve stenosis. Tricuspid Valve: The tricuspid valve is normal in structure. Tricuspid valve regurgitation is mild . No evidence of tricuspid stenosis. Aortic Valve: The aortic valve is normal in structure. Aortic valve regurgitation is not visualized. No aortic stenosis is present. Aortic valve mean gradient measures 2.0 mmHg. Aortic valve peak gradient measures 4.0 mmHg. Aortic valve area, by VTI measures 1.21 cm. Pulmonic Valve: The pulmonic valve was normal in structure. Pulmonic valve regurgitation is trivial. No evidence of pulmonic stenosis. Aorta: The aortic root is normal in size and structure. Venous: The inferior vena cava is dilated in size with less than 50% respiratory variability, suggesting right atrial pressure of 15 mmHg. IAS/Shunts: No atrial level shunt detected by color flow Doppler.  LEFT VENTRICLE PLAX 2D LVIDd:         6.00 cm LVIDs:         5.10 cm LV PW:         1.10 cm LV IVS:        1.00 cm LVOT diam:     2.00 cm LV SV:         19 LV SV Index:   7 LVOT Area:     3.14 cm  RIGHT VENTRICLE RV Basal diam:  3.70 cm RV Mid diam:    2.70 cm TAPSE (M-mode): 1.7 cm LEFT ATRIUM             Index        RIGHT ATRIUM           Index LA diam:        4.40 cm 1.70 cm/m   RA Area:     21.40 cm LA Vol (A2C):   81.1 ml 31.39 ml/m  RA Volume:   64.10 ml  24.81 ml/m LA Vol (A4C):   52.1 ml 20.17 ml/m LA Biplane Vol: 65.3 ml 25.28 ml/m  AORTIC VALVE                    PULMONIC VALVE AV Area (Vmax):    1.52 cm     PV Vmax:  0.63 m/s AV Area (Vmean):   1.53 cm     PV Vmean:         41.900 cm/s AV Area (VTI):     1.21 cm     PV VTI:           0.106 m AV Vmax:           100.00 cm/s  PV Peak grad:     1.6 mmHg AV Vmean:          65.800 cm/s  PV Mean grad:     1.0 mmHg AV VTI:            0.159 m      PR End  Diast Vel: 3.21 msec AV Peak Grad:      4.0 mmHg AV Mean Grad:      2.0 mmHg LVOT Vmax:         48.30 cm/s LVOT Vmean:        31.960 cm/s LVOT VTI:          0.061 m LVOT/AV VTI ratio: 0.38  AORTA Ao Root diam: 3.20 cm Ao Asc diam:  3.10 cm MITRAL VALVE               TRICUSPID VALVE MV Area (PHT): 5.37 cm    TR Peak grad:   42.5 mmHg MV E velocity: 88.10 cm/s  TR Vmax:        326.00 cm/s                             SHUNTS                            Systemic VTI:  0.06 m                            Systemic Diam: 2.00 cm Skeet Latch MD Electronically signed by Skeet Latch MD Signature Date/Time: 11/09/2021/5:39:38 PM    Final    DG Chest 2 View  Result Date: 11/09/2021 CLINICAL DATA:  Dyspnea EXAM: CHEST - 2 VIEW COMPARISON:  05/20/2019 FINDINGS: Cardiac shadow is enlarged. Mild vascular congestion is noted without significant interstitial edema. No focal infiltrate or effusion is noted. No bony abnormality is seen. IMPRESSION: Vascular congestion without edema. Electronically Signed   By: Inez Catalina M.D.   On: 11/09/2021 01:36     Scheduled Meds:  apixaban  5 mg Oral BID   carvedilol  12.5 mg Oral BID WC   magnesium oxide  800 mg Oral Daily   potassium chloride  40 mEq Oral Daily   sodium chloride flush  3 mL Intravenous Q12H   Continuous Infusions:  sodium chloride     furosemide 120 mg (11/10/21 0946)     LOS: 1 day    Time spent: 14min    Domenic Polite, MD [facility]   11/10/2021, 11:00 AM

## 2021-11-10 NOTE — Progress Notes (Addendum)
Heart Failure Patient Advocate Encounter   Received notification from [place] that prior authorization for [place] is required.   PA submitted on CoverMyMeds Key [id number]  Status is approved through 11/09/24   Initial copay >$500 because deductible is remaining. RNCM investigating how much is remaining on deductible.  He is eligible for monthly copay cards and these will pay toward his deductible  Kerby Nora, PharmD, BCPS Heart Failure Stewardship Pharmacist Phone [phone]  Please check [email or web address] for unit-specific pharmacist phone numbers

## 2021-11-10 NOTE — Plan of Care (Signed)
  Problem: Clinical Measurements: Goal: Will remain free from infection Outcome: Completed/Met   Problem: Nutrition: Goal: Adequate nutrition will be maintained Outcome: Completed/Met   Problem: Coping: Goal: Level of anxiety will decrease Outcome: Completed/Met   Problem: Pain Managment: Goal: General experience of comfort will improve Outcome: Completed/Met   Problem: Safety: Goal: Ability to remain free from injury will improve Outcome: Completed/Met   Problem: Skin Integrity: Goal: Risk for impaired skin integrity will decrease Outcome: Completed/Met

## 2021-11-10 NOTE — ED Provider Notes (Signed)
Richard [name]   CSN: [id number] Arrival date & time: 11/09/21  0022     History  Chief Complaint  Patient presents with   SOB / Abdominal Swelling     Richard [name] is a 51 y.o. male.   Shortness of Breath Severity:  Moderate Onset quality:  Gradual Duration:  2 weeks Timing:  Constant Progression:  Worsening Chronicity:  Recurrent Context: activity   Ineffective treatments: Lasix. Associated symptoms: no abdominal pain and no fever        Home Medications Prior to Admission medications   Medication Sig Start Date End Date Taking? Authorizing Provider  acetaminophen (TYLENOL) 500 MG tablet Take 1,000 mg by mouth every 6 (six) hours as needed for moderate pain or headache.   Yes [provider]  amLODipine (NORVASC) 2.5 MG tablet Take 1 tablet (2.5 mg total) by mouth daily. 07/20/19  Yes Shirley Friar, PA-C  aspirin EC 81 MG EC tablet Take 1 tablet (81 mg total) by mouth daily. 05/22/19  Yes Dessa Phi, DO  carvedilol (COREG) 25 MG tablet TAKE 1 TABLET (25 MG TOTAL) BY MOUTH 2 (TWO) TIMES DAILY WITH A MEAL. 03/28/21  Yes Chandrasekhar, Mahesh A, MD  furosemide (LASIX) 40 MG tablet Take 1 tablet (40 mg total) by mouth daily. Pt. Needs to make an appt. With Cardiologist in order to receive future refills. Thank You. 1st Attempt. 11/07/21  Yes Baldwin Jamaica, PA-C  Multiple Vitamins-Minerals (MULTIVITAMIN GUMMIES ADULTS PO) Take 2 tablets by mouth daily.   Yes [provider]  Omega-3 Fatty Acids (FISH OIL) 1000 MG CAPS Take 1,000 mg by mouth daily.   Yes [provider]  potassium chloride SA (KLOR-CON M) 20 MEQ tablet Take 1 tablet (20 mEq total) by mouth 2 (two) times daily. 05/02/21  Yes Camnitz, Will Hassell Done, MD  sacubitril-valsartan (ENTRESTO) 24-26 MG Take 1 tablet by mouth 2 (two) times daily. 09/06/21  Yes Chandrasekhar, Mahesh A, MD  spironolactone (ALDACTONE) 25 MG tablet Take 0.5 tablets (12.5 mg  total) by mouth daily. Pt. Needs to make an appt. With Cardiologist in order to receive future refills. Thank You. 1st Attempt. 11/07/21  Yes Baldwin Jamaica, PA-C  atorvastatin (LIPITOR) 40 MG tablet Take 1 tablet (40 mg total) by mouth daily. Patient not taking: Reported on 11/09/2021 06/10/19   Maximiano Coss, NP      Allergies    Patient has no known allergies.    Review of Systems   Review of Systems  Constitutional:  Negative for fever.  Respiratory:  Positive for shortness of breath.   Gastrointestinal:  Negative for abdominal pain.    Physical Exam Updated Vital Signs BP 124/85 (BP Location: Right Arm)   Pulse 73   Temp 97.9 F (36.6 C) (Oral)   Resp 20   Ht 5\' 11"  (1.803 m)   Wt (!) 144.4 kg   SpO2 97%   BMI 44.39 kg/m  Physical Exam Vitals and nursing [name] reviewed.  Constitutional:      Appearance: He is well-developed.  HENT:     Head: Normocephalic and atraumatic.     Mouth/Throat:     Mouth: Mucous membranes are moist.     Pharynx: Oropharynx is clear.  Eyes:     Pupils: Pupils are equal, round, and reactive to light.  Cardiovascular:     Rate and Rhythm: Tachycardia present. Rhythm irregular.  Pulmonary:     Effort: Pulmonary effort is normal. No respiratory  distress.  Abdominal:     General: There is distension.  Musculoskeletal:        General: Swelling present. Normal range of motion.     Cervical back: Normal range of motion.  Skin:    General: Skin is warm and dry.  Neurological:     General: No focal deficit present.     Mental Status: He is alert.     ED Results / Procedures / Treatments   Labs (all labs ordered are listed, but only abnormal results are displayed) Labs Reviewed  CBC WITH DIFFERENTIAL/PLATELET - Abnormal; Notable for the following components:      Result Value   nRBC 0.9 (*)    All other components within normal limits  COMPREHENSIVE METABOLIC PANEL - Abnormal; Notable for the following components:   Glucose, Bld 198  (*)    BUN 29 (*)    Creatinine, Ser 2.52 (*)    Calcium 8.5 (*)    Total Protein 6.1 (*)    Albumin 3.3 (*)    ALT 51 (*)    GFR, Estimated 30 (*)    All other components within normal limits  BRAIN NATRIURETIC PEPTIDE - Abnormal; Notable for the following components:   B Natriuretic Peptide 962.5 (*)    All other components within normal limits  COMPREHENSIVE METABOLIC PANEL - Abnormal; Notable for the following components:   Glucose, Bld 201 (*)    BUN 31 (*)    Creatinine, Ser 2.51 (*)    Calcium 8.4 (*)    Total Protein 6.0 (*)    Albumin 3.2 (*)    ALT 53 (*)    GFR, Estimated 30 (*)    All other components within normal limits  HEMOGLOBIN A1C - Abnormal; Notable for the following components:   Hgb A1c MFr Bld 6.4 (*)    All other components within normal limits  TROPONIN I (HIGH SENSITIVITY) - Abnormal; Notable for the following components:   Troponin I (High Sensitivity) 466 (*)    All other components within normal limits  TROPONIN I (HIGH SENSITIVITY) - Abnormal; Notable for the following components:   Troponin I (High Sensitivity) 349 (*)    All other components within normal limits  MAGNESIUM  LACTIC ACID, PLASMA  HIV ANTIBODY (ROUTINE TESTING W REFLEX)  TSH  BASIC METABOLIC PANEL  VITAMIN G66  FOLATE  IRON AND TIBC  FERRITIN  RETICULOCYTES    EKG EKG Interpretation  Date/Time:  Thursday November 09 2021 04:34:43 EDT Ventricular Rate:  135 PR Interval:    QRS Duration: 83 QT Interval:  347 QTC Calculation: 478 R Axis:   97 Text Interpretation: Atrial fibrillation Ventricular tachycardia, unsustained Borderline right axis deviation Borderline repolarization abnormality Borderline prolonged QT interval When compared with ECG of EARLIER SAME DATE QT has shortened HEART RATE has increased Confirmed by Delora Fuel ([id number]) on 11/09/2021 5:41:08 AM  Radiology ECHOCARDIOGRAM COMPLETE  Result Date: 11/09/2021    ECHOCARDIOGRAM REPORT   Patient Name:   Richard [name] Date of Exam: 11/09/2021 Medical Rec #:  [id number]     Height:       71.0 in Accession #:    [id number]    Weight:       322.5 lb Date of Birth:  1971/01/23     BSA:          2.583 m Patient Age:    41 years      BP:  147/100 mmHg Patient Gender: M             HR:           100 bpm. Exam Location:  Inpatient Procedure: 2D Echo, Cardiac Doppler, Color Doppler and Intracardiac            Opacification Agent Indications:    CHF  History:        Patient has prior history of Echocardiogram examinations, most                 recent 05/20/2019. CHF, Stroke; Risk Factors:Hypertension and                 Diabetes.  Sonographer:    Luisa Hart RDCS Referring Phys: [id number] ALLISON L ELLIS  Sonographer Comments: Technically difficult study due to poor echo windows and patient is morbidly obese. IMPRESSIONS  1. Left ventricular ejection fraction, by estimation, is 25 to 30%. The left ventricle has severely decreased function. The left ventricle demonstrates global hypokinesis. The left ventricular internal cavity size was mildly dilated. Left ventricular diastolic function could not be evaluated.  2. Right ventricular systolic function is normal. The right ventricular size is normal. There is moderately elevated pulmonary artery systolic pressure.  3. Left atrial size was mildly dilated.  4. The mitral valve is normal in structure. Trivial mitral valve regurgitation. No evidence of mitral stenosis.  5. The aortic valve is normal in structure. Aortic valve regurgitation is not visualized. No aortic stenosis is present.  6. The inferior vena cava is dilated in size with <50% respiratory variability, suggesting right atrial pressure of 15 mmHg. FINDINGS  Left Ventricle: Left ventricular ejection fraction, by estimation, is 25 to 30%. The left ventricle has severely decreased function. The left ventricle demonstrates global hypokinesis. The left ventricular internal cavity size was mildly dilated. There is no left  ventricular hypertrophy. Left ventricular diastolic function could not be evaluated due to atrial fibrillation. Left ventricular diastolic function could not be evaluated.  LV Wall Scoring: The entire apex and mid inferoseptal segment are akinetic. The anterior wall, antero-lateral wall, anterior septum, inferior wall, posterior wall, and basal inferoseptal segment are hypokinetic. Right Ventricle: The right ventricular size is normal. No increase in right ventricular wall thickness. Right ventricular systolic function is normal. There is moderately elevated pulmonary artery systolic pressure. The tricuspid regurgitant velocity is 3.26 m/s, and with an assumed right atrial pressure of 15 mmHg, the estimated right ventricular systolic pressure is 07.3 mmHg. Left Atrium: Left atrial size was mildly dilated. Right Atrium: Right atrial size was normal in size. Pericardium: There is no evidence of pericardial effusion. Mitral Valve: The mitral valve is normal in structure. Trivial mitral valve regurgitation. No evidence of mitral valve stenosis. Tricuspid Valve: The tricuspid valve is normal in structure. Tricuspid valve regurgitation is mild . No evidence of tricuspid stenosis. Aortic Valve: The aortic valve is normal in structure. Aortic valve regurgitation is not visualized. No aortic stenosis is present. Aortic valve mean gradient measures 2.0 mmHg. Aortic valve peak gradient measures 4.0 mmHg. Aortic valve area, by VTI measures 1.21 cm. Pulmonic Valve: The pulmonic valve was normal in structure. Pulmonic valve regurgitation is trivial. No evidence of pulmonic stenosis. Aorta: The aortic root is normal in size and structure. Venous: The inferior vena cava is dilated in size with less than 50% respiratory variability, suggesting right atrial pressure of 15 mmHg. IAS/Shunts: No atrial level shunt detected by color flow Doppler.  LEFT VENTRICLE PLAX 2D  LVIDd:         6.00 cm LVIDs:         5.10 cm LV PW:         1.10  cm LV IVS:        1.00 cm LVOT diam:     2.00 cm LV SV:         19 LV SV Index:   7 LVOT Area:     3.14 cm  RIGHT VENTRICLE RV Basal diam:  3.70 cm RV Mid diam:    2.70 cm TAPSE (M-mode): 1.7 cm LEFT ATRIUM             Index        RIGHT ATRIUM           Index LA diam:        4.40 cm 1.70 cm/m   RA Area:     21.40 cm LA Vol (A2C):   81.1 ml 31.39 ml/m  RA Volume:   64.10 ml  24.81 ml/m LA Vol (A4C):   52.1 ml 20.17 ml/m LA Biplane Vol: 65.3 ml 25.28 ml/m  AORTIC VALVE                    PULMONIC VALVE AV Area (Vmax):    1.52 cm     PV Vmax:          0.63 m/s AV Area (Vmean):   1.53 cm     PV Vmean:         41.900 cm/s AV Area (VTI):     1.21 cm     PV VTI:           0.106 m AV Vmax:           100.00 cm/s  PV Peak grad:     1.6 mmHg AV Vmean:          65.800 cm/s  PV Mean grad:     1.0 mmHg AV VTI:            0.159 m      PR End Diast Vel: 3.21 msec AV Peak Grad:      4.0 mmHg AV Mean Grad:      2.0 mmHg LVOT Vmax:         48.30 cm/s LVOT Vmean:        31.960 cm/s LVOT VTI:          0.061 m LVOT/AV VTI ratio: 0.38  AORTA Ao Root diam: 3.20 cm Ao Asc diam:  3.10 cm MITRAL VALVE               TRICUSPID VALVE MV Area (PHT): 5.37 cm    TR Peak grad:   42.5 mmHg MV E velocity: 88.10 cm/s  TR Vmax:        326.00 cm/s                             SHUNTS                            Systemic VTI:  0.06 m                            Systemic Diam: 2.00 cm Skeet Latch MD Electronically signed by Skeet Latch MD Signature Date/Time: 11/09/2021/5:39:38 PM    Final  DG Chest 2 View  Result Date: 11/09/2021 CLINICAL DATA:  Dyspnea EXAM: CHEST - 2 VIEW COMPARISON:  05/20/2019 FINDINGS: Cardiac shadow is enlarged. Mild vascular congestion is noted without significant interstitial edema. No focal infiltrate or effusion is noted. No bony abnormality is seen. IMPRESSION: Vascular congestion without edema. Electronically Signed   By: Inez Catalina M.D.   On: 11/09/2021 01:36    Procedures .Critical Care  Performed  by: Merrily Pew, MD Authorized by: Merrily Pew, MD   Critical care provider statement:    Critical care time (minutes):  30   Critical care was necessary to treat or prevent imminent or life-threatening deterioration of the following conditions:  Respiratory failure   Critical care was time spent personally by me on the following activities:  Development of treatment plan with patient or surrogate, discussions with consultants, evaluation of patient's response to treatment, examination of patient, ordering and review of laboratory studies, ordering and review of radiographic studies, ordering and performing treatments and interventions, pulse oximetry, re-evaluation of patient's condition and review of old charts     Medications Ordered in ED Medications  potassium chloride (KLOR-CON M) CR tablet 40 mEq (40 mEq Oral Given 11/09/21 0909)  magnesium oxide (MAG-OX) tablet 800 mg (800 mg Oral Given 11/09/21 0910)  apixaban (ELIQUIS) tablet 5 mg (5 mg Oral Given 11/09/21 2122)  carvedilol (COREG) tablet 6.25 mg (6.25 mg Oral Given 11/09/21 1646)  hydrALAZINE (APRESOLINE) injection 10 mg (10 mg Intravenous Given 11/09/21 1730)  furosemide (LASIX) injection 80 mg (80 mg Intravenous Given 11/09/21 1704)  sodium chloride flush (NS) 0.9 % injection 3 mL (3 mLs Intravenous Given 11/09/21 2126)  sodium chloride flush (NS) 0.9 % injection 3 mL (has no administration in time range)  0.9 %  sodium chloride infusion (has no administration in time range)  acetaminophen (TYLENOL) tablet 650 mg (has no administration in time range)  ondansetron (ZOFRAN) injection 4 mg (has no administration in time range)  perflutren lipid microspheres (DEFINITY) IV suspension (2 mLs Intravenous Given 11/09/21 1328)  furosemide (LASIX) injection 40 mg (40 mg Intravenous Given 11/09/21 0456)  amiodarone (NEXTERONE) 1.8 mg/mL load via infusion 150 mg (150 mg Intravenous Bolus from Bag 11/09/21 0458)  furosemide (LASIX) injection 40 mg (40 mg  Intravenous Given 11/09/21 9675)    ED Course/ Medical Decision Making/ A&P                           Medical Decision Making Risk Prescription drug management. Decision regarding hospitalization.   Here with likely decompensated heart failure. Had some episodes of nonsustained vtach and elevated troponin. D/w cardiology, will hold on heparin. Started amiodarone. They will see in consultation. Started diuresis here. D/w medicine for admission.   Final Clinical Impression(s) / ED Diagnoses Final diagnoses:  Heart failure, unspecified HF chronicity, unspecified heart failure type ([place])  Acute renal failure superimposed on chronic kidney disease, unspecified CKD stage, unspecified acute renal failure type [facility])    Rx / DC Orders ED Discharge Orders          Ordered    Amb Referral to HF Clinic        11/09/21 [facility]              Lionell Matuszak, Corene Cornea, MD 11/10/21 9163

## 2021-11-11 DIAGNOSIS — I5021 Acute systolic (congestive) heart failure: Secondary | ICD-10-CM | POA: Diagnosis not present

## 2021-11-11 LAB — BASIC METABOLIC PANEL
Anion gap: 10 (ref 5–15)
BUN: 25 mg/dL — ABNORMAL HIGH (ref 6–20)
CO2: 25 mmol/L (ref 22–32)
Calcium: 8.3 mg/dL — ABNORMAL LOW (ref 8.9–10.3)
Chloride: 103 mmol/L (ref 98–111)
Creatinine, Ser: 1.9 mg/dL — ABNORMAL HIGH (ref 0.61–1.24)
GFR, Estimated: 42 mL/min — ABNORMAL LOW (ref >60)
Glucose, Bld: 143 mg/dL — ABNORMAL HIGH (ref 70–99)
Potassium: 3.4 mmol/L — ABNORMAL LOW (ref 3.5–5.1)
Sodium: 138 mmol/L (ref 135–145)

## 2021-11-11 LAB — GLUCOSE, CAPILLARY
Glucose-Capillary: 155 mg/dL — ABNORMAL HIGH (ref 70–99)
Glucose-Capillary: 125 mg/dL — ABNORMAL HIGH (ref 70–99)

## 2021-11-11 MED ORDER — FUROSEMIDE 10 MG/ML IJ SOLN
80.0000 mg | Freq: Two times a day (BID) | INTRAMUSCULAR | Status: DC
Start: 2021-11-11 — End: 2021-11-12
  Administered 2021-11-11 (×2): 80 mg via INTRAVENOUS
  Filled 2021-11-11 (×2): qty 8

## 2021-11-11 MED ORDER — POTASSIUM CHLORIDE CRYS ER 20 MEQ PO TBCR
40.0000 meq | EXTENDED_RELEASE_TABLET | Freq: Once | ORAL | Status: AC
Start: 2021-11-11 — End: 2021-11-11
  Administered 2021-11-11: 40 meq via ORAL
  Filled 2021-11-11: qty 2

## 2021-11-11 MED ORDER — INSULIN ASPART 100 UNIT/ML IJ SOLN: 0.0000 [IU] | Freq: Three times a day (TID) | INTRAMUSCULAR | Status: DC

## 2021-11-11 MED ORDER — DAPAGLIFLOZIN PROPANEDIOL 10 MG PO TABS
10.0000 mg | ORAL_TABLET | Freq: Every day | ORAL | Status: DC
  Administered 2021-11-11 – 2021-11-15 (×5): 10 mg via ORAL
  Filled 2021-11-11 (×5): qty 1

## 2021-11-11 MED ORDER — SACUBITRIL-VALSARTAN 24-26 MG PO TABS
1.0000 | ORAL_TABLET | Freq: Two times a day (BID) | ORAL | Status: DC
  Administered 2021-11-11 – 2021-11-15 (×9): 1 via ORAL
  Filled 2021-11-11 (×9): qty 1

## 2021-11-11 NOTE — Progress Notes (Addendum)
Progress Note  Patient Name: Richard [name] Date of Encounter: 11/11/2021  [facility] Cardiologist: Werner Lean, MD   Subjective   Denies CP; dyspnea improving  Inpatient Medications    Scheduled Meds:  apixaban  5 mg Oral BID   carvedilol  12.5 mg Oral BID WC   magnesium oxide  800 mg Oral Daily   potassium chloride  40 mEq Oral Daily   sodium chloride flush  3 mL Intravenous Q12H   Continuous Infusions:  sodium chloride     furosemide Stopped (11/10/21 1954)   PRN Meds: sodium chloride, acetaminophen, hydrALAZINE, ondansetron (ZOFRAN) IV, sodium chloride flush   Vital Signs    Vitals:   11/10/21 1815 11/10/21 1948 11/11/21 0007 11/11/21 0431  BP: (!) 129/95 (!) 158/110 104/61 (!) 123/102  Pulse: (!) 104  (!) 113   Resp:  19  19  Temp:  98.6 F (37 C) 97.8 F (36.6 C) 97.8 F (36.6 C)  TempSrc:  Oral Oral Oral  SpO2:  98% 97%   Weight:    (!) 142.4 kg  Height:        Intake/Output Summary (Last 24 hours) at 11/11/2021 0657 Last data filed at 11/11/2021 0430 Gross per 24 hour  Intake 1077 ml  Output 4475 ml  Net -3398 ml      11/11/2021    4:31 AM 11/10/2021    4:20 AM 11/09/2021   11:43 AM  Last 3 Weights  Weight (lbs) 314 lb 318 lb 4.8 oz 322 lb 8.5 oz  Weight (kg) 142.429 kg 144.38 kg 146.3 kg      Telemetry    Atrial fibrillation with RVR - Personally Reviewed  Physical Exam   GEN: No acute distress.   Neck: supple Cardiac: irregular Respiratory: Clear to auscultation bilaterally. GI: Soft, nontender, non-distended  MS: No edema Neuro:  Nonfocal  Psych: Normal affect   Labs    High Sensitivity Troponin:   Recent Labs  Lab 11/09/21 0054 11/09/21 0256  TROPONINIHS 466* 349*     Chemistry Recent Labs  Lab 11/09/21 0054 11/09/21 1520 11/10/21 0845 11/11/21 0237  NA 141 141 137 138  K 3.5 3.7 3.4* 3.4*  CL 106 107 105 103  CO2 22 24 21* 25  GLUCOSE 198* 201* 210* 143*  BUN 29* 31* 30* 25*  CREATININE 2.52*  2.51* 2.34* 1.90*  CALCIUM 8.5* 8.4* 8.3* 8.3*  MG 1.9  --   --   --   PROT 6.1* 6.0*  --   --   ALBUMIN 3.3* 3.2*  --   --   AST 39 37  --   --   ALT 51* 53*  --   --   ALKPHOS 45 38  --   --   BILITOT 0.7 0.7  --   --   GFRNONAA 30* 30* 33* 42*  ANIONGAP 13 10 11 10      Hematology Recent Labs  Lab 11/09/21 0054 11/10/21 0845  WBC 8.0  --   RBC 4.85 4.90  HGB 14.3  --   HCT 44.2  --   MCV 91.1  --   MCH 29.5  --   MCHC 32.4  --   RDW 14.7  --   PLT 207  --    Thyroid  Recent Labs  Lab 11/09/21 1520  TSH 1.293    BNP Recent Labs  Lab 11/09/21 0055  BNP 962.5*    Radiology    ECHOCARDIOGRAM COMPLETE  Result Date:  11/09/2021    ECHOCARDIOGRAM REPORT   Patient Name:   Richard [name] Date of Exam: 11/09/2021 Medical Rec #:  [id number]     Height:       71.0 in Accession #:    [id number]    Weight:       322.5 lb Date of Birth:  [birth year]     BSA:          2.583 m Patient Age:    51 years      BP:           147/100 mmHg Patient Gender: M             HR:           100 bpm. Exam Location:  Inpatient Procedure: 2D Echo, Cardiac Doppler, Color Doppler and Intracardiac            Opacification Agent Indications:    CHF  History:        Patient has prior history of Echocardiogram examinations, most                 recent 05/20/2019. CHF, Stroke; Risk Factors:Hypertension and                 Diabetes.  Sonographer:    Luisa Hart RDCS Referring Phys: [id number] ALLISON L ELLIS  Sonographer Comments: Technically difficult study due to poor echo windows and patient is morbidly obese. IMPRESSIONS  1. Left ventricular ejection fraction, by estimation, is 25 to 30%. The left ventricle has severely decreased function. The left ventricle demonstrates global hypokinesis. The left ventricular internal cavity size was mildly dilated. Left ventricular diastolic function could not be evaluated.  2. Right ventricular systolic function is normal. The right ventricular size is normal. There is moderately  elevated pulmonary artery systolic pressure.  3. Left atrial size was mildly dilated.  4. The mitral valve is normal in structure. Trivial mitral valve regurgitation. No evidence of mitral stenosis.  5. The aortic valve is normal in structure. Aortic valve regurgitation is not visualized. No aortic stenosis is present.  6. The inferior vena cava is dilated in size with <50% respiratory variability, suggesting right atrial pressure of 15 mmHg. FINDINGS  Left Ventricle: Left ventricular ejection fraction, by estimation, is 25 to 30%. The left ventricle has severely decreased function. The left ventricle demonstrates global hypokinesis. The left ventricular internal cavity size was mildly dilated. There is no left ventricular hypertrophy. Left ventricular diastolic function could not be evaluated due to atrial fibrillation. Left ventricular diastolic function could not be evaluated.  LV Wall Scoring: The entire apex and mid inferoseptal segment are akinetic. The anterior wall, antero-lateral wall, anterior septum, inferior wall, posterior wall, and basal inferoseptal segment are hypokinetic. Right Ventricle: The right ventricular size is normal. No increase in right ventricular wall thickness. Right ventricular systolic function is normal. There is moderately elevated pulmonary artery systolic pressure. The tricuspid regurgitant velocity is 3.26 m/s, and with an assumed right atrial pressure of 15 mmHg, the estimated right ventricular systolic pressure is 74.2 mmHg. Left Atrium: Left atrial size was mildly dilated. Right Atrium: Right atrial size was normal in size. Pericardium: There is no evidence of pericardial effusion. Mitral Valve: The mitral valve is normal in structure. Trivial mitral valve regurgitation. No evidence of mitral valve stenosis. Tricuspid Valve: The tricuspid valve is normal in structure. Tricuspid valve regurgitation is mild . No evidence of tricuspid stenosis. Aortic Valve: The aortic valve is  normal in structure.  Aortic valve regurgitation is not visualized. No aortic stenosis is present. Aortic valve mean gradient measures 2.0 mmHg. Aortic valve peak gradient measures 4.0 mmHg. Aortic valve area, by VTI measures 1.21 cm. Pulmonic Valve: The pulmonic valve was normal in structure. Pulmonic valve regurgitation is trivial. No evidence of pulmonic stenosis. Aorta: The aortic root is normal in size and structure. Venous: The inferior vena cava is dilated in size with less than 50% respiratory variability, suggesting right atrial pressure of 15 mmHg. IAS/Shunts: No atrial level shunt detected by color flow Doppler.  LEFT VENTRICLE PLAX 2D LVIDd:         6.00 cm LVIDs:         5.10 cm LV PW:         1.10 cm LV IVS:        1.00 cm LVOT diam:     2.00 cm LV SV:         19 LV SV Index:   7 LVOT Area:     3.14 cm  RIGHT VENTRICLE RV Basal diam:  3.70 cm RV Mid diam:    2.70 cm TAPSE (M-mode): 1.7 cm LEFT ATRIUM             Index        RIGHT ATRIUM           Index LA diam:        4.40 cm 1.70 cm/m   RA Area:     21.40 cm LA Vol (A2C):   81.1 ml 31.39 ml/m  RA Volume:   64.10 ml  24.81 ml/m LA Vol (A4C):   52.1 ml 20.17 ml/m LA Biplane Vol: 65.3 ml 25.28 ml/m  AORTIC VALVE                    PULMONIC VALVE AV Area (Vmax):    1.52 cm     PV Vmax:          0.63 m/s AV Area (Vmean):   1.53 cm     PV Vmean:         41.900 cm/s AV Area (VTI):     1.21 cm     PV VTI:           0.106 m AV Vmax:           100.00 cm/s  PV Peak grad:     1.6 mmHg AV Vmean:          65.800 cm/s  PV Mean grad:     1.0 mmHg AV VTI:            0.159 m      PR End Diast Vel: 3.21 msec AV Peak Grad:      4.0 mmHg AV Mean Grad:      2.0 mmHg LVOT Vmax:         48.30 cm/s LVOT Vmean:        31.960 cm/s LVOT VTI:          0.061 m LVOT/AV VTI ratio: 0.38  AORTA Ao Root diam: 3.20 cm Ao Asc diam:  3.10 cm MITRAL VALVE               TRICUSPID VALVE MV Area (PHT): 5.37 cm    TR Peak grad:   42.5 mmHg MV E velocity: 88.10 cm/s  TR Vmax:         326.00 cm/s  SHUNTS                            Systemic VTI:  0.06 m                            Systemic Diam: 2.00 cm Skeet Latch MD Electronically signed by Skeet Latch MD Signature Date/Time: 11/09/2021/5:39:38 PM    Final      Patient Profile     51 y.o. male with past medical history of nonischemic cardiomyopathy (previous improvement in ejection fraction but now again reduced), prior CVA, history of noncompliance, diabetes mellitus, chronic stage III kidney disease for evaluation of acute on chronic systolic congestive heart failure and newly diagnosed atrial fibrillation.  Echocardiogram this admission shows ejection fraction 25 to 30% with global hypokinesis, mild left ventricular enlargement, moderate pulmonary hypertension, mild left atrial enlargement.  Assessment & Plan    1 acute on chronic systolic congestive heart failure-I/O-3398; Wt 142.4 kg.  Volume status is improving.  We will decrease Lasix to 80 mg IV twice daily. Add Farxiga 10 mg daily.  Continue to follow renal function closely.  We will add low-dose spironolactone later if renal function stable.  2 history of nonischemic cardiomyopathy-LV function was reduced in the past with report of no coronary disease.  His LV function improved but now is reduced again.  Possibly secondary to uncontrolled hypertension versus tachycardia mediated related atrial fibrillation.  We will continue carvedilol for rate control of atrial fibrillation.  Resume Entresto 24/26 twice daily.  Follow blood pressure and titrate medications as needed.  Would favor reestablishing sinus rhythm and titrating medications.  Repeat echocardiogram 3 months later to see if LV function has improved.  If not would need repeat ischemia evaluation.  3 persistent atrial fibrillation-patient has newly diagnosed atrial fibrillation.  Continue carvedilol for rate control.  Continue apixaban.  Would plan TEE guided cardioversion early next  week.  4 acute on chronic stage IIIa kidney disease-renal function mildly improved today.  We will continue to follow.  5 hypertension-blood pressure has been elevated during this admission.  Will resume Entresto and follow closely.  For questions or updates, please contact [facility] Please consult [email or web address] for contact info under        Signed, Kirk Ruths, MD  11/11/2021, 6:57 AM

## 2021-11-11 NOTE — Progress Notes (Signed)
PROGRESS NOTE    Richard [name]  OYD:[id number] DOB: 12-19-1970 DOA: 11/09/2021 PCP: Maximiano Coss, NP  51 y.o. male with a hx of  NICM and HFrEF (EF 20-25%), CVA, type 2 diabetes mellitus, CKD 3a, obesity presented to the ED with worsening shortness of breath, orthopnea and abdominal swelling, BNP was 962, troponin 466, chest x-ray with pulmonary vascular congestion, also noted to be in new onset A-fib with RVR,   Subjective: -Feels better, breathing continuing to improve  Assessment and Plan:  Acute combined systolic and diastolic heart failure -EF was previously 20-25%, subsequently improved and then reduced again in 2017, cath in 2014 was negative for obstruction, -Inconsistent medication use, in the setting of losing insurance in between -Repeat echo with EF 25-30%, RV function is normal, moderate PAH -Cardiology following, Lasix increased to 120 Mg twice daily, diuresing better 4.2 L negative, weight down 8 pounds, creatinine improving to 1.9 today -GDMT limited by AKI/CKD, starting [place] and Entresto today -Monitor weights, kidney function closely -Would benefit from [facility] clinic follow-up  New onset atrial fib with RVR -New, heart rate suboptimally controlled, continue carvedilol -Started on Eliquis, cards planning TEE/DCCV if he does not convert to NSR once volume status has improved   Acute kidney injury on stage IIIb chronic kidney disease Baseline creatinine 1.62, was 2.5 on admission -Suspect cardiorenal physiology, improving, down to 1.9 today -Avoid hypotension   Hypertension -Improving, continue Coreg and diuretics  History of CVA Aspirin on hold, now started on Eliquis   Prediabetes Hemoglobin A1c in 2017 was 8.7.  Most recent was 2021 and down to 5.5   Obesity Counseling for weight reduction recommended in the outpatient Body mass index is 41.51 kg/m.     DVT prophylaxis: Eliquis Code Status: Full code Family Communication: Discussed patient detail, no  family at bedside Disposition Plan: Home likely 3 to 4 days  Consultants: Cards   Procedures:   Antimicrobials:    Objective: Vitals:   11/10/21 1815 11/10/21 1948 11/11/21 0007 11/11/21 0431  BP: (!) 129/95 (!) 158/110 104/61 (!) 123/102  Pulse: (!) 104  (!) 113   Resp:  19  19  Temp:  98.6 F (37 C) 97.8 F (36.6 C) 97.8 F (36.6 C)  TempSrc:  Oral Oral Oral  SpO2:  98% 97%   Weight:    (!) 142.4 kg  Height:        Intake/Output Summary (Last 24 hours) at 11/11/2021 1116 Last data filed at 11/11/2021 0430 Gross per 24 hour  Intake 777 ml  Output 3850 ml  Net -3073 ml   Filed Weights   11/09/21 1143 11/10/21 0420 11/11/21 0431  Weight: (!) 146.3 kg (!) 144.4 kg (!) 142.4 kg    Examination:  General exam: Pleasant male sitting up in bed, AAOx3, no distress HEENT: Positive JVD CVS: S1-S2, irregularly irregular rhythm Lungs: Decreased breath sounds to bases, few basilar rales Abdomen: Soft, obese, mildly distended, abdominal wall edema Extremities: Trace edema Skin: No rashes Psychiatry:  Mood & affect appropriate.     Data Reviewed:   CBC: Recent Labs  Lab 11/09/21 0054  WBC 8.0  NEUTROABS 4.8  HGB 14.3  HCT 44.2  MCV 91.1  PLT 672   Basic Metabolic Panel: Recent Labs  Lab 11/09/21 0054 11/09/21 1520 11/10/21 0845 11/11/21 0237  NA 141 141 137 138  K 3.5 3.7 3.4* 3.4*  CL 106 107 105 103  CO2 22 24 21* 25  GLUCOSE 198* 201* 210* 143*  BUN  29* 31* 30* 25*  CREATININE 2.52* 2.51* 2.34* 1.90*  CALCIUM 8.5* 8.4* 8.3* 8.3*  MG 1.9  --   --   --    GFR: Estimated Creatinine Clearance: 66.4 mL/min (A) (by C-G formula based on SCr of 1.9 mg/dL (H)). Liver Function Tests: Recent Labs  Lab 11/09/21 0054 11/09/21 1520  AST 39 37  ALT 51* 53*  ALKPHOS 45 38  BILITOT 0.7 0.7  PROT 6.1* 6.0*  ALBUMIN 3.3* 3.2*   No results for input(s): "LIPASE", "AMYLASE" in the last 168 hours. No results for input(s): "AMMONIA" in the last 168  hours. Coagulation Profile: No results for input(s): "INR", "PROTIME" in the last 168 hours. Cardiac Enzymes: No results for input(s): "CKTOTAL", "CKMB", "CKMBINDEX", "TROPONINI" in the last 168 hours. BNP (last 3 results) No results for input(s): "PROBNP" in the last 8760 hours. HbA1C: Recent Labs    11/09/21 1520  HGBA1C 6.4*   CBG: Recent Labs  Lab 11/11/21 1109  GLUCAP 155*   Lipid Profile: No results for input(s): "CHOL", "HDL", "LDLCALC", "TRIG", "CHOLHDL", "LDLDIRECT" in the last 72 hours. Thyroid Function Tests: Recent Labs    11/09/21 1520  TSH 1.293   Anemia Panel: Recent Labs    11/10/21 0845 11/10/21 1151  VITAMINB12  --  329  FOLATE  --  14.6  FERRITIN  --  84  TIBC  --  427  IRON  --  48  RETICCTPCT 2.9  --    Urine analysis:    Component Value Date/Time   APPEARANCEUR Clear 06/10/2019 1014   GLUCOSEU Negative 06/10/2019 1014   BILIRUBINUR Negative 06/10/2019 1014   PROTEINUR 2+ (A) 06/10/2019 1014   UROBILINOGEN 1.0 01/03/2013 1809   NITRITE Negative 06/10/2019 1014   LEUKOCYTESUR Negative 06/10/2019 1014   Sepsis Labs: @LABRCNTIP (procalcitonin:4,lacticidven:4)  )No results found for this or any previous visit (from the past 240 hour(s)).   Radiology Studies: ECHOCARDIOGRAM COMPLETE  Result Date: 11/09/2021    ECHOCARDIOGRAM REPORT   Patient Name:   Richard [name] Date of Exam: 11/09/2021 Medical Rec #:  [id number]     Height:       71.0 in Accession #:    [id number]    Weight:       322.5 lb Date of Birth:  1970/07/24     BSA:          2.583 m Patient Age:    1 years      BP:           147/100 mmHg Patient Gender: M             HR:           100 bpm. Exam Location:  Inpatient Procedure: 2D Echo, Cardiac Doppler, Color Doppler and Intracardiac            Opacification Agent Indications:    CHF  History:        Patient has prior history of Echocardiogram examinations, most                 recent 05/20/2019. CHF, Stroke; Risk Factors:Hypertension and                  Diabetes.  Sonographer:    Luisa Hart RDCS Referring Phys: [id number] ALLISON L ELLIS  Sonographer Comments: Technically difficult study due to poor echo windows and patient is morbidly obese. IMPRESSIONS  1. Left ventricular ejection fraction, by estimation, is 25 to 30%. The left ventricle has severely decreased  function. The left ventricle demonstrates global hypokinesis. The left ventricular internal cavity size was mildly dilated. Left ventricular diastolic function could not be evaluated.  2. Right ventricular systolic function is normal. The right ventricular size is normal. There is moderately elevated pulmonary artery systolic pressure.  3. Left atrial size was mildly dilated.  4. The mitral valve is normal in structure. Trivial mitral valve regurgitation. No evidence of mitral stenosis.  5. The aortic valve is normal in structure. Aortic valve regurgitation is not visualized. No aortic stenosis is present.  6. The inferior vena cava is dilated in size with <50% respiratory variability, suggesting right atrial pressure of 15 mmHg. FINDINGS  Left Ventricle: Left ventricular ejection fraction, by estimation, is 25 to 30%. The left ventricle has severely decreased function. The left ventricle demonstrates global hypokinesis. The left ventricular internal cavity size was mildly dilated. There is no left ventricular hypertrophy. Left ventricular diastolic function could not be evaluated due to atrial fibrillation. Left ventricular diastolic function could not be evaluated.  LV Wall Scoring: The entire apex and mid inferoseptal segment are akinetic. The anterior wall, antero-lateral wall, anterior septum, inferior wall, posterior wall, and basal inferoseptal segment are hypokinetic. Right Ventricle: The right ventricular size is normal. No increase in right ventricular wall thickness. Right ventricular systolic function is normal. There is moderately elevated pulmonary artery systolic pressure. The  tricuspid regurgitant velocity is 3.26 m/s, and with an assumed right atrial pressure of 15 mmHg, the estimated right ventricular systolic pressure is 40.9 mmHg. Left Atrium: Left atrial size was mildly dilated. Right Atrium: Right atrial size was normal in size. Pericardium: There is no evidence of pericardial effusion. Mitral Valve: The mitral valve is normal in structure. Trivial mitral valve regurgitation. No evidence of mitral valve stenosis. Tricuspid Valve: The tricuspid valve is normal in structure. Tricuspid valve regurgitation is mild . No evidence of tricuspid stenosis. Aortic Valve: The aortic valve is normal in structure. Aortic valve regurgitation is not visualized. No aortic stenosis is present. Aortic valve mean gradient measures 2.0 mmHg. Aortic valve peak gradient measures 4.0 mmHg. Aortic valve area, by VTI measures 1.21 cm. Pulmonic Valve: The pulmonic valve was normal in structure. Pulmonic valve regurgitation is trivial. No evidence of pulmonic stenosis. Aorta: The aortic root is normal in size and structure. Venous: The inferior vena cava is dilated in size with less than 50% respiratory variability, suggesting right atrial pressure of 15 mmHg. IAS/Shunts: No atrial level shunt detected by color flow Doppler.  LEFT VENTRICLE PLAX 2D LVIDd:         6.00 cm LVIDs:         5.10 cm LV PW:         1.10 cm LV IVS:        1.00 cm LVOT diam:     2.00 cm LV SV:         19 LV SV Index:   7 LVOT Area:     3.14 cm  RIGHT VENTRICLE RV Basal diam:  3.70 cm RV Mid diam:    2.70 cm TAPSE (M-mode): 1.7 cm LEFT ATRIUM             Index        RIGHT ATRIUM           Index LA diam:        4.40 cm 1.70 cm/m   RA Area:     21.40 cm LA Vol (A2C):   81.1 ml 31.39 ml/m  RA Volume:   64.10 ml  24.81 ml/m LA Vol (A4C):   52.1 ml 20.17 ml/m LA Biplane Vol: 65.3 ml 25.28 ml/m  AORTIC VALVE                    PULMONIC VALVE AV Area (Vmax):    1.52 cm     PV Vmax:          0.63 m/s AV Area (Vmean):   1.53 cm      PV Vmean:         41.900 cm/s AV Area (VTI):     1.21 cm     PV VTI:           0.106 m AV Vmax:           100.00 cm/s  PV Peak grad:     1.6 mmHg AV Vmean:          65.800 cm/s  PV Mean grad:     1.0 mmHg AV VTI:            0.159 m      PR End Diast Vel: 3.21 msec AV Peak Grad:      4.0 mmHg AV Mean Grad:      2.0 mmHg LVOT Vmax:         48.30 cm/s LVOT Vmean:        31.960 cm/s LVOT VTI:          0.061 m LVOT/AV VTI ratio: 0.38  AORTA Ao Root diam: 3.20 cm Ao Asc diam:  3.10 cm MITRAL VALVE               TRICUSPID VALVE MV Area (PHT): 5.37 cm    TR Peak grad:   42.5 mmHg MV E velocity: 88.10 cm/s  TR Vmax:        326.00 cm/s                             SHUNTS                            Systemic VTI:  0.06 m                            Systemic Diam: 2.00 cm Skeet Latch MD Electronically signed by Skeet Latch MD Signature Date/Time: 11/09/2021/5:39:38 PM    Final      Scheduled Meds:  apixaban  5 mg Oral BID   carvedilol  12.5 mg Oral BID WC   dapagliflozin propanediol  10 mg Oral Daily   furosemide  80 mg Intravenous BID   insulin aspart  0-9 Units Subcutaneous TID WC   potassium chloride  40 mEq Oral Daily   sacubitril-valsartan  1 tablet Oral BID   sodium chloride flush  3 mL Intravenous Q12H   Continuous Infusions:  sodium chloride       LOS: 2 days    Time spent: 73min    Domenic Polite, MD [facility]   11/11/2021, 11:16 AM

## 2021-11-12 DIAGNOSIS — I5021 Acute systolic (congestive) heart failure: Secondary | ICD-10-CM | POA: Diagnosis not present

## 2021-11-12 LAB — BASIC METABOLIC PANEL
Anion gap: 9 (ref 5–15)
BUN: 23 mg/dL — ABNORMAL HIGH (ref 6–20)
CO2: 28 mmol/L (ref 22–32)
Calcium: 8 mg/dL — ABNORMAL LOW (ref 8.9–10.3)
Chloride: 103 mmol/L (ref 98–111)
Creatinine, Ser: 1.92 mg/dL — ABNORMAL HIGH (ref 0.61–1.24)
GFR, Estimated: 42 mL/min — ABNORMAL LOW (ref >60)
Glucose, Bld: 158 mg/dL — ABNORMAL HIGH (ref 70–99)
Potassium: 3.5 mmol/L (ref 3.5–5.1)
Sodium: 140 mmol/L (ref 135–145)

## 2021-11-12 MED ORDER — FUROSEMIDE 10 MG/ML IJ SOLN
40.0000 mg | Freq: Every day | INTRAMUSCULAR | Status: DC
  Administered 2021-11-12 – 2021-11-13 (×2): 40 mg via INTRAVENOUS
  Filled 2021-11-12 (×3): qty 4

## 2021-11-12 MED ORDER — FUROSEMIDE 10 MG/ML IJ SOLN
40.0000 mg | Freq: Every day | INTRAMUSCULAR | Status: DC
Start: 2021-11-13 — End: 2021-11-12

## 2021-11-12 MED ORDER — POTASSIUM CHLORIDE 20 MEQ PO PACK
40.0000 meq | PACK | Freq: Two times a day (BID) | ORAL | Status: AC
  Administered 2021-11-12 (×2): 40 meq via ORAL
  Filled 2021-11-12 (×2): qty 2

## 2021-11-12 MED ORDER — SPIRONOLACTONE 25 MG PO TABS
25.0000 mg | ORAL_TABLET | Freq: Every day | ORAL | Status: DC
  Administered 2021-11-12 – 2021-11-15 (×4): 25 mg via ORAL
  Filled 2021-11-12 (×4): qty 1

## 2021-11-12 NOTE — Progress Notes (Signed)
PROGRESS NOTE    Richard [name]  FAO:[id number] DOB: 03-23-1971 DOA: 11/09/2021 PCP: Maximiano Coss, NP  51 y.o. male with a hx of  NICM and HFrEF (EF 20-25%), CVA, type 2 diabetes mellitus, CKD 3a, obesity presented to the ED with worsening shortness of breath, orthopnea and abdominal swelling, BNP was 962, troponin 466, chest x-ray with pulmonary vascular congestion, also noted to be in new onset A-fib with RVR,  -Improving on diuretics, plan for TEE/cardioversion  Subjective: -Feels better, breathing continues to improve, swelling decreasing  Assessment and Plan:  Acute combined systolic and diastolic heart failure -EF was previously 20-25%, subsequently improved and then reduced again in 2017, cath in 2014 was negative for obstruction, -Inconsistent medication use, in the setting of losing insurance in between -Repeat echo with EF 25-30%, RV function is normal, moderate PAH -Cardiology following, he is 8 L negative, Lasix dose decreased,  mild improvement in creatinine, 1.9 today -GDMT limited by AKI/CKD, started on Farxiga and Entresto yesterday -Monitor weights, kidney function closely -Would benefit from [facility] clinic follow-up  New onset atrial fib with RVR -New, heart rate suboptimally controlled, continue carvedilol -Started on Eliquis, cards planning TEE/DCCV if he does not convert to NSR once volume status has improved, hopefully tomorrow   Acute kidney injury on stage IIIb chronic kidney disease Baseline creatinine 1.62, was 2.5 on admission -Suspect cardiorenal physiology, improving, down to 1.9 today -Avoid hypotension   Hypertension -Improving, continue Coreg and diuretics  History of CVA Aspirin on hold, now started on Eliquis   Prediabetes Hemoglobin A1c in 2017 was 8.7.  Most recent was 2021 and down to 5.5   Obesity Counseling for weight reduction recommended in the outpatient Body mass index is 41.51 kg/m.     DVT prophylaxis: Eliquis Code Status: Full  code Family Communication: Discussed patient detail, no family at bedside Disposition Plan: Home likely 48 hours  Consultants: Cards   Procedures:   Antimicrobials:    Objective: Vitals:   11/11/21 2134 11/12/21 0042 11/12/21 0439 11/12/21 0747  BP: (!) 127/98 106/65 (!) 121/106 130/82  Pulse: 100 96 73 74  Resp: 20 19 19 18   Temp: 98.6 F (37 C) 98.6 F (37 C) 98 F (36.7 C) 98 F (36.7 C)  TempSrc:  Oral Oral Oral  SpO2:  98% 98% 95%  Weight:   (!) 140.7 kg   Height:        Intake/Output Summary (Last 24 hours) at 11/12/2021 1114 Last data filed at 11/12/2021 0930 Gross per 24 hour  Intake 483 ml  Output 2950 ml  Net -2467 ml   Filed Weights   11/10/21 0420 11/11/21 0431 11/12/21 0439  Weight: (!) 144.4 kg (!) 142.4 kg (!) 140.7 kg    Examination:  General exam: Obese pleasant male sitting up in the recliner, AAOx3, no distress HEENT: Positive JVD CVS: S1-S2, irregularly irregular rhythm Lungs: Decreased breath sounds the bases, few basilar rales Abdomen: Soft, obese, less distended, abdominal wall edema improving Extremities: Trace edema Skin: No rashes Psychiatry:  Mood & affect appropriate.     Data Reviewed:   CBC: Recent Labs  Lab 11/09/21 0054  WBC 8.0  NEUTROABS 4.8  HGB 14.3  HCT 44.2  MCV 91.1  PLT 696   Basic Metabolic Panel: Recent Labs  Lab 11/09/21 0054 11/09/21 1520 11/10/21 0845 11/11/21 0237 11/12/21 0354  NA 141 141 137 138 140  K 3.5 3.7 3.4* 3.4* 3.5  CL 106 107 105 103 103  CO2 22  24 21* 25 28  GLUCOSE 198* 201* 210* 143* 158*  BUN 29* 31* 30* 25* 23*  CREATININE 2.52* 2.51* 2.34* 1.90* 1.92*  CALCIUM 8.5* 8.4* 8.3* 8.3* 8.0*  MG 1.9  --   --   --   --    GFR: Estimated Creatinine Clearance: 65.3 mL/min (A) (by C-G formula based on SCr of 1.92 mg/dL (H)). Liver Function Tests: Recent Labs  Lab 11/09/21 0054 11/09/21 1520  AST 39 37  ALT 51* 53*  ALKPHOS 45 38  BILITOT 0.7 0.7  PROT 6.1* 6.0*  ALBUMIN  3.3* 3.2*   No results for input(s): "LIPASE", "AMYLASE" in the last 168 hours. No results for input(s): "AMMONIA" in the last 168 hours. Coagulation Profile: No results for input(s): "INR", "PROTIME" in the last 168 hours. Cardiac Enzymes: No results for input(s): "CKTOTAL", "CKMB", "CKMBINDEX", "TROPONINI" in the last 168 hours. BNP (last 3 results) No results for input(s): "PROBNP" in the last 8760 hours. HbA1C: Recent Labs    11/09/21 1520  HGBA1C 6.4*   CBG: Recent Labs  Lab 11/11/21 1109 11/11/21 1546  GLUCAP 155* 125*   Lipid Profile: No results for input(s): "CHOL", "HDL", "LDLCALC", "TRIG", "CHOLHDL", "LDLDIRECT" in the last 72 hours. Thyroid Function Tests: Recent Labs    11/09/21 1520  TSH 1.293   Anemia Panel: Recent Labs    11/10/21 0845 11/10/21 1151  VITAMINB12  --  329  FOLATE  --  14.6  FERRITIN  --  84  TIBC  --  427  IRON  --  48  RETICCTPCT 2.9  --    Urine analysis:    Component Value Date/Time   APPEARANCEUR Clear 06/10/2019 1014   GLUCOSEU Negative 06/10/2019 1014   BILIRUBINUR Negative 06/10/2019 1014   PROTEINUR 2+ (A) 06/10/2019 1014   UROBILINOGEN 1.0 01/03/2013 1809   NITRITE Negative 06/10/2019 1014   LEUKOCYTESUR Negative 06/10/2019 1014   Sepsis Labs: @LABRCNTIP (procalcitonin:4,lacticidven:4)  )No results found for this or any previous visit (from the past 240 hour(s)).   Radiology Studies: No results found.   Scheduled Meds:  apixaban  5 mg Oral BID   carvedilol  12.5 mg Oral BID WC   dapagliflozin propanediol  10 mg Oral Daily   furosemide  40 mg Intravenous Daily   potassium chloride  40 mEq Oral BID   sacubitril-valsartan  1 tablet Oral BID   sodium chloride flush  3 mL Intravenous Q12H   spironolactone  25 mg Oral Daily   Continuous Infusions:  sodium chloride       LOS: 3 days    Time spent: 55min  Domenic Polite, MD [facility]   11/12/2021, 11:14 AM

## 2021-11-12 NOTE — Progress Notes (Addendum)
Progress Note  Patient Name: Richard [name] Date of Encounter: 11/12/2021  [facility] Cardiologist: Werner Lean, MD   Subjective   No CP or dyspnea this AM  Inpatient Medications    Scheduled Meds:  apixaban  5 mg Oral BID   carvedilol  12.5 mg Oral BID WC   dapagliflozin propanediol  10 mg Oral Daily   furosemide  80 mg Intravenous BID   potassium chloride  40 mEq Oral BID   sacubitril-valsartan  1 tablet Oral BID   sodium chloride flush  3 mL Intravenous Q12H   Continuous Infusions:  sodium chloride     PRN Meds: sodium chloride, acetaminophen, hydrALAZINE, ondansetron (ZOFRAN) IV, sodium chloride flush   Vital Signs    Vitals:   11/11/21 2134 11/12/21 0042 11/12/21 0439 11/12/21 0747  BP: (!) 127/98 106/65 (!) 121/106 130/82  Pulse: 100 96 73 74  Resp: 20 19 19 18   Temp: 98.6 F (37 C) 98.6 F (37 C) 98 F (36.7 C) 98 F (36.7 C)  TempSrc:  Oral Oral Oral  SpO2:  98% 98% 95%  Weight:   (!) 140.7 kg   Height:        Intake/Output Summary (Last 24 hours) at 11/12/2021 0817 Last data filed at 11/12/2021 0441 Gross per 24 hour  Intake 483 ml  Output 4250 ml  Net -3767 ml       11/12/2021    4:39 AM 11/11/2021    4:31 AM 11/10/2021    4:20 AM  Last 3 Weights  Weight (lbs) 310 lb 1.6 oz 314 lb 318 lb 4.8 oz  Weight (kg) 140.66 kg 142.429 kg 144.38 kg      Telemetry    Atrial fibrillation rate intermittently increased - Personally Reviewed  Physical Exam   GEN: NAD  Neck: supple, no JVD Cardiac: irregular, normal rate Respiratory: CTA GI: Soft, NT/ND MS: No edema Neuro:  Grossly intact Psych: Normal affect   Labs    High Sensitivity Troponin:   Recent Labs  Lab 11/09/21 0054 11/09/21 0256  TROPONINIHS 466* 349*      Chemistry Recent Labs  Lab 11/09/21 0054 11/09/21 1520 11/10/21 0845 11/11/21 0237 11/12/21 0354  NA 141 141 137 138 140  K 3.5 3.7 3.4* 3.4* 3.5  CL 106 107 105 103 103  CO2 22 24 21* 25 28   GLUCOSE 198* 201* 210* 143* 158*  BUN 29* 31* 30* 25* 23*  CREATININE 2.52* 2.51* 2.34* 1.90* 1.92*  CALCIUM 8.5* 8.4* 8.3* 8.3* 8.0*  MG 1.9  --   --   --   --   PROT 6.1* 6.0*  --   --   --   ALBUMIN 3.3* 3.2*  --   --   --   AST 39 37  --   --   --   ALT 51* 53*  --   --   --   ALKPHOS 45 38  --   --   --   BILITOT 0.7 0.7  --   --   --   GFRNONAA 30* 30* 33* 42* 42*  ANIONGAP 13 10 11 10 9       Hematology Recent Labs  Lab 11/09/21 0054 11/10/21 0845  WBC 8.0  --   RBC 4.85 4.90  HGB 14.3  --   HCT 44.2  --   MCV 91.1  --   MCH 29.5  --   MCHC 32.4  --   RDW 14.7  --  PLT 207  --     Thyroid  Recent Labs  Lab 11/09/21 1520  TSH 1.293     BNP Recent Labs  Lab 11/09/21 0055  BNP 962.5*     Patient Profile     51 y.o. male with past medical history of nonischemic cardiomyopathy (previous improvement in ejection fraction but now again reduced), prior CVA, history of noncompliance, diabetes mellitus, chronic stage III kidney disease for evaluation of acute on chronic systolic congestive heart failure and newly diagnosed atrial fibrillation.  Echocardiogram this admission shows ejection fraction 25 to 30% with global hypokinesis, mild left ventricular enlargement, moderate pulmonary hypertension, mild left atrial enlargement.  Assessment & Plan    1 acute on chronic systolic congestive heart failure-I/O-3767; Wt 140.7 kg.  Volume status has improved.  We will decrease Lasix to 40 mg IV daily.  Add spironolactone 25 mg daily.  Continue Farxiga.  Continue to follow renal function.    2 history of nonischemic cardiomyopathy-LV function was reduced in the past with report of no coronary disease.  His LV function improved but now is reduced again.  Possibly secondary to uncontrolled hypertension versus tachycardia mediated related atrial fibrillation.  Continue present dose of carvedilol and Entresto.  Follow blood pressure and titrate medications as tolerated.   Would plan to repeat echocardiogram 3 months after medications fully titrated in sinus rhythm reestablished.  If LV function remains decreased would need repeat ischemia evaluation.  3 persistent atrial fibrillation-patient has newly diagnosed atrial fibrillation.  Continue carvedilol for rate control.  Continue apixaban.  We will try and arrange TEE guided cardioversion early this coming week.  We will keep n.p.o. after midnight in case there is a spot tomorrow.  4 acute on chronic stage IIIa kidney disease-renal function unchanged today.  Continue to follow.  5 hypertension-Entresto resumed yesterday.  We will follow blood pressure and advance medications as needed.  For questions or updates, please contact [place] Please consult [email or web address] for contact info under        Signed, Kirk Ruths, MD  11/12/2021, 8:17 AM

## 2021-11-13 ENCOUNTER — Other Ambulatory Visit (HOSPITAL_COMMUNITY): Payer: Self-pay

## 2021-11-13 ENCOUNTER — Encounter (HOSPITAL_COMMUNITY)
Admission: EM | Disposition: A | Discharge status: Home / Self Care | Payer: Self-pay | Source: Home / Self Care | Attending: Internal Medicine

## 2021-11-13 ENCOUNTER — Inpatient Hospital Stay (HOSPITAL_COMMUNITY): Payer: 59

## 2021-11-13 ENCOUNTER — Encounter (HOSPITAL_COMMUNITY): Payer: Self-pay | Admitting: Internal Medicine

## 2021-11-13 ENCOUNTER — Inpatient Hospital Stay (HOSPITAL_COMMUNITY): Payer: 59 | Admitting: Anesthesiology

## 2021-11-13 DIAGNOSIS — I081 Rheumatic disorders of both mitral and tricuspid valves: Secondary | ICD-10-CM

## 2021-11-13 DIAGNOSIS — I4891 Unspecified atrial fibrillation: Secondary | ICD-10-CM

## 2021-11-13 DIAGNOSIS — I10 Essential (primary) hypertension: Secondary | ICD-10-CM | POA: Diagnosis not present

## 2021-11-13 DIAGNOSIS — I509 Heart failure, unspecified: Secondary | ICD-10-CM

## 2021-11-13 DIAGNOSIS — I110 Hypertensive heart disease with heart failure: Secondary | ICD-10-CM | POA: Diagnosis not present

## 2021-11-13 DIAGNOSIS — I5021 Acute systolic (congestive) heart failure: Secondary | ICD-10-CM | POA: Diagnosis not present

## 2021-11-13 HISTORY — PX: CARDIOVERSION: SHX1299

## 2021-11-13 HISTORY — PX: TEE WITHOUT CARDIOVERSION: SHX5443

## 2021-11-13 LAB — BASIC METABOLIC PANEL
Anion gap: 10 (ref 5–15)
BUN: 23 mg/dL — ABNORMAL HIGH (ref 6–20)
CO2: 23 mmol/L (ref 22–32)
Calcium: 8.3 mg/dL — ABNORMAL LOW (ref 8.9–10.3)
Chloride: 104 mmol/L (ref 98–111)
Creatinine, Ser: 1.92 mg/dL — ABNORMAL HIGH (ref 0.61–1.24)
GFR, Estimated: 42 mL/min — ABNORMAL LOW (ref >60)
Glucose, Bld: 137 mg/dL — ABNORMAL HIGH (ref 70–99)
Potassium: 4 mmol/L (ref 3.5–5.1)
Sodium: 137 mmol/L (ref 135–145)

## 2021-11-13 LAB — CBC
HCT: 49.3 % (ref 39.0–52.0)
Hemoglobin: 15.3 g/dL (ref 13.0–17.0)
MCH: 28.4 pg (ref 26.0–34.0)
MCHC: 31 g/dL (ref 30.0–36.0)
MCV: 91.5 fL (ref 80.0–100.0)
Platelets: 224 10*3/uL (ref 150–400)
RBC: 5.39 MIL/uL (ref 4.22–5.81)
RDW: 14.6 % (ref 11.5–15.5)
WBC: 7.4 10*3/uL (ref 4.0–10.5)
nRBC: 0 % (ref 0.0–0.2)

## 2021-11-13 LAB — PROTIME-INR
INR: 1.1 (ref 0.8–1.2)
Prothrombin Time: 14.5 seconds (ref 11.4–15.2)

## 2021-11-13 SURGERY — ECHOCARDIOGRAM, TRANSESOPHAGEAL
Anesthesia: Monitor Anesthesia Care

## 2021-11-13 MED ORDER — PROPOFOL 500 MG/50ML IV EMUL
INTRAVENOUS | Status: DC | PRN
  Administered 2021-11-13: 125 ug/kg/min via INTRAVENOUS

## 2021-11-13 MED ORDER — PHENYLEPHRINE HCL-NACL 20-0.9 MG/250ML-% IV SOLN
INTRAVENOUS | Status: DC | PRN
  Administered 2021-11-13: 50 ug/min via INTRAVENOUS

## 2021-11-13 MED ORDER — PROPOFOL 10 MG/ML IV BOLUS
INTRAVENOUS | Status: DC | PRN
  Administered 2021-11-13 (×2): 20 mg via INTRAVENOUS

## 2021-11-13 MED ORDER — LIDOCAINE 2% (20 MG/ML) 5 ML SYRINGE
INTRAMUSCULAR | Status: DC | PRN
  Administered 2021-11-13: 100 mg via INTRAVENOUS

## 2021-11-13 MED ORDER — PHENYLEPHRINE 80 MCG/ML (10ML) SYRINGE FOR IV PUSH (FOR BLOOD PRESSURE SUPPORT)
PREFILLED_SYRINGE | INTRAVENOUS | Status: DC | PRN
  Administered 2021-11-13: 80 ug via INTRAVENOUS
  Administered 2021-11-13: 160 ug via INTRAVENOUS
  Administered 2021-11-13 (×2): 80 ug via INTRAVENOUS
  Administered 2021-11-13 (×2): 160 ug via INTRAVENOUS

## 2021-11-13 MED ORDER — SODIUM CHLORIDE 0.9 % IV SOLN: INTRAVENOUS | Status: DC

## 2021-11-13 NOTE — Transfer of Care (Signed)
Immediate Anesthesia Transfer of Care Note  Patient: Richard [name]  Procedure(s) Performed: TRANSESOPHAGEAL ECHOCARDIOGRAM (TEE) CARDIOVERSION  Patient Location: Endoscopy Unit  Anesthesia Type:MAC  Level of Consciousness: drowsy and patient cooperative  Airway & Oxygen Therapy: Patient Spontanous Breathing and Patient connected to face mask oxygen  Post-op Assessment: Report given to RN, Post -op Vital signs reviewed and stable and Patient moving all extremities  Post vital signs: Reviewed and stable  Last Vitals:  Vitals Value Taken Time  BP 110/64 11/13/21 1010  Temp    Pulse 71 11/13/21 1013  Resp 27 11/13/21 1013  SpO2 100 % 11/13/21 1013  Vitals shown include unvalidated device data.  Last Pain:  Vitals:   11/13/21 0908  TempSrc: Temporal  PainSc: 0-No pain         Complications: No notable events documented.

## 2021-11-13 NOTE — Progress Notes (Signed)
PROGRESS NOTE    Richard [name]  HYI:[id number] DOB: Nov 25, 1970 DOA: 11/09/2021 PCP: Maximiano Coss, NP  51 y.o. male with a hx of  NICM and HFrEF (EF 20-25%), CVA, type 2 diabetes mellitus, CKD 3a, obesity presented to the ED with worsening shortness of breath, orthopnea and abdominal swelling, BNP was 962, troponin 466, chest x-ray with pulmonary vascular congestion, also noted to be in new onset A-fib with RVR,  -Improving on diuretics, plan for TEE/cardioversion  Subjective: -Feels better overall, breathing continues to improve, going down for cardioversion this morning  Assessment and Plan:  Acute combined systolic and diastolic heart failure -EF was previously 20-25%, subsequently improved and then reduced again in 2017, cath in 2014 was negative for obstruction, -Inconsistent medication use, in the setting of losing insurance in between -Repeat echo with EF 25-30%, RV function is normal, moderate PAH -Cardiology following, he is 10.1 L negative, weight down 12 pounds -Remains on Lasix IV daily, also started on Aldactone, Farxiga and Entresto this admission, continue carvedilol -TOC clinic follow-up arranged, cardioversion today  New onset atrial fib with RVR -New, heart rate suboptimally controlled, continue carvedilol -Started on Eliquis, cards planning TEE/DCCV today   Acute kidney injury on stage IIIb chronic kidney disease Baseline creatinine 1.62, was 2.5 on admission -Suspect cardiorenal physiology, now 1.9 -Avoid hypotension   Hypertension -Improving, continue Coreg and diuretics  History of CVA Aspirin on hold, now started on Eliquis   Prediabetes Hemoglobin A1c in 2017 was 8.7.  Now 6.4,, started on SGLT2i this admission   Obesity Counseling for weight reduction recommended in the outpatient Body mass index is 41.51 kg/m.     DVT prophylaxis: Eliquis Code Status: Full code Family Communication: Discussed patient detail, no family at bedside Disposition  Plan: Home likely tomorrow if stable  Consultants: Cards   Procedures:   Antimicrobials:    Objective: Vitals:   11/13/21 1036 11/13/21 1038 11/13/21 1053 11/13/21 1125  BP: 125/87 134/83 121/78 (!) 123/100  Pulse: 75 75 98 87  Resp: 18 (!) 22 20 20   Temp:   97.6 F (36.4 C)   TempSrc:   Oral   SpO2: 96% 96% 96% 95%  Weight:      Height:        Intake/Output Summary (Last 24 hours) at 11/13/2021 1157 Last data filed at 11/13/2021 1004 Gross per 24 hour  Intake 540 ml  Output 1075 ml  Net -535 ml   Filed Weights   11/12/21 0439 11/13/21 0446 11/13/21 0908  Weight: (!) 140.7 kg (!) 141.4 kg (!) 140.6 kg    Examination:  General exam: Obese pleasant male sitting up in bed, AAOx3, no distress HEENT: Positive JVD CVS: S1-S2, irregularly irregular rhythm Lungs: Decreased breath sounds bases otherwise clear Abdomen: Soft, obese, nontender, less distended Extremities: Trace edema Skin: No rashes Psychiatry:  Mood & affect appropriate.     Data Reviewed:   CBC: Recent Labs  Lab 11/09/21 0054 11/13/21 0334  WBC 8.0 7.4  NEUTROABS 4.8  --   HGB 14.3 15.3  HCT 44.2 49.3  MCV 91.1 91.5  PLT 207 786   Basic Metabolic Panel: Recent Labs  Lab 11/09/21 0054 11/09/21 1520 11/10/21 0845 11/11/21 0237 11/12/21 0354 11/13/21 0334  NA 141 141 137 138 140 137  K 3.5 3.7 3.4* 3.4* 3.5 4.0  CL 106 107 105 103 103 104  CO2 22 24 21* 25 28 23   GLUCOSE 198* 201* 210* 143* 158* 137*  BUN 29* 31* 30*  25* 23* 23*  CREATININE 2.52* 2.51* 2.34* 1.90* 1.92* 1.92*  CALCIUM 8.5* 8.4* 8.3* 8.3* 8.0* 8.3*  MG 1.9  --   --   --   --   --    GFR: Estimated Creatinine Clearance: 65.3 mL/min (A) (by C-G formula based on SCr of 1.92 mg/dL (H)). Liver Function Tests: Recent Labs  Lab 11/09/21 0054 11/09/21 1520  AST 39 37  ALT 51* 53*  ALKPHOS 45 38  BILITOT 0.7 0.7  PROT 6.1* 6.0*  ALBUMIN 3.3* 3.2*   No results for input(s): "LIPASE", "AMYLASE" in the last 168  hours. No results for input(s): "AMMONIA" in the last 168 hours. Coagulation Profile: Recent Labs  Lab 11/13/21 1114  INR 1.1   Cardiac Enzymes: No results for input(s): "CKTOTAL", "CKMB", "CKMBINDEX", "TROPONINI" in the last 168 hours. BNP (last 3 results) No results for input(s): "PROBNP" in the last 8760 hours. HbA1C: No results for input(s): "HGBA1C" in the last 72 hours.  CBG: Recent Labs  Lab 11/11/21 1109 11/11/21 1546  GLUCAP 155* 125*   Lipid Profile: No results for input(s): "CHOL", "HDL", "LDLCALC", "TRIG", "CHOLHDL", "LDLDIRECT" in the last 72 hours. Thyroid Function Tests: No results for input(s): "TSH", "T4TOTAL", "FREET4", "T3FREE", "THYROIDAB" in the last 72 hours.  Anemia Panel: No results for input(s): "VITAMINB12", "FOLATE", "FERRITIN", "TIBC", "IRON", "RETICCTPCT" in the last 72 hours.  Urine analysis:    Component Value Date/Time   APPEARANCEUR Clear 06/10/2019 1014   GLUCOSEU Negative 06/10/2019 1014   BILIRUBINUR Negative 06/10/2019 1014   PROTEINUR 2+ (A) 06/10/2019 1014   UROBILINOGEN 1.0 01/03/2013 1809   NITRITE Negative 06/10/2019 1014   LEUKOCYTESUR Negative 06/10/2019 1014   Sepsis Labs: @LABRCNTIP (procalcitonin:4,lacticidven:4)  )No results found for this or any previous visit (from the past 240 hour(s)).   Radiology Studies: No results found.   Scheduled Meds:  apixaban  5 mg Oral BID   carvedilol  12.5 mg Oral BID WC   dapagliflozin propanediol  10 mg Oral Daily   furosemide  40 mg Intravenous Daily   sacubitril-valsartan  1 tablet Oral BID   sodium chloride flush  3 mL Intravenous Q12H   spironolactone  25 mg Oral Daily   Continuous Infusions:  sodium chloride       LOS: 4 days    Time spent: 52min  Domenic Polite, MD [facility]   11/13/2021, 11:57 AM

## 2021-11-13 NOTE — Progress Notes (Signed)
Heart Failure Nurse Navigator Progress Note  PCP: Maximiano Coss, NP PCP-Cardiologist: Gasper Sells Admission Diagnosis: none Admitted from: Home  Presentation:   [place] presented with increasing swelling to abdomen and shortness of breath x 1 week. H/O non compliance, BNP 962, Troponin 466-349, EKG A Fib with frequent PVC's, BP 142/129, HR 70, 2+ edema,   Education was done with patient, sign and symptoms of heart failure, daily weights, taking all his medications as prescribed, diet/ fluid restrictions, and attending all his medical appointments. Patient stated he has heard this all before on his last visits. He stated he had no futher questions at this time. Patient is scheduled for a hospital follow up in HF TOC on 11/24/21.   ECHO/ LVEF: 20-25% HFrEF  Clinical Course:  Past Medical History:  Diagnosis Date   Acute decompensated heart failure ([place]) 01/03/2013   Allergy    CHF (congestive heart failure) ([facility])    systolic   Chronic kidney disease    CKD (chronic kidney disease) stage 2, GFR 60-89 ml/min 01/04/2013   DM (diabetes mellitus) ([place]) 01/06/2013   Hypertension    hx htn emergency   Obesity    Stroke [facility])      Social History   Socioeconomic History   Marital status: Single    Spouse name: Not on file   Number of children: Not on file   Years of education: Not on file   Highest education level: Not on file  Occupational History   Not on file  Tobacco Use   Smoking status: Never   Smokeless tobacco: Never  Substance and Sexual Activity   Alcohol use: No   Drug use: No   Sexual activity: Not on file  Other Topics Concern   Not on file  Social History Narrative   Work or School: Engineer, agricultural, Tax inspector      Home Situation: lives alone      Spiritual Beliefs: Christian      Lifestyle: 15 minutes dialy walking - working up; working on diet            Social Determinants of Radio broadcast assistant Strain: Low Risk   (06/23/2019)   Overall Financial Resource Strain (CARDIA)    Difficulty of Paying Living Expenses: Not hard at all  Food Insecurity: No Food Insecurity (06/23/2019)   Hunger Vital Sign    Worried About Running Out of Food in the Last Year: Never true    [place] in the Last Year: Never true  Transportation Needs: No Transportation Needs (06/23/2019)   PRAPARE - Hydrologist (Medical): No    Lack of Transportation (Non-Medical): No  Physical Activity: Sufficiently Active (06/23/2019)   Exercise Vital Sign    Days of Exercise per Week: 5 days    Minutes of Exercise per Session: 30 min  Stress: Stress Concern Present (06/23/2019)   [place]    Feeling of Stress : Rather much  Social Connections: Moderately Isolated (06/23/2019)   Social Connection and Isolation Panel [NHANES]    Frequency of Communication with Friends and Family: Three times a week    Frequency of Social Gatherings with Friends and Family: Three times a week    Attends Religious Services: 1 to 4 times per year    Active Member of Clubs or Organizations: No    Attends Archivist Meetings: Never    Marital Status:  Never married   Education Assessment and Provision:  Detailed education and instructions provided on heart failure disease management including the following:  Signs and symptoms of Heart Failure When to call the physician Importance of daily weights Low sodium diet Fluid restriction Medication management Anticipated future follow-up appointments  Patient education given on each of the above topics.  Patient acknowledges understanding via teach back method and acceptance of all instructions.  Education Materials:  "Living Better With Heart Failure" Booklet, HF zone tool, & Daily Weight Tracker Tool.  Patient has scale at home: yes Patient has pill box at home: No    High Risk Criteria for  Readmission and/or Poor Patient Outcomes: Heart failure hospital admissions (last 6 months): 1  No Show rate: 2 Difficult social situation: No Demonstrates medication adherence: Yes Primary Language: English Literacy level: Reading, writing, and comprehension  Barriers of Care:   Daily weights Diet/ fluids  Considerations/Referrals:   Referral made to Heart Failure Pharmacist Stewardship: yes Referral made to Heart Failure CSW/NCM TOC: no Referral made to Heart & Vascular TOC clinic: yes, 11/24/21  Items for Follow-up on DC/TOC: Diet/ fluids Daily weights   Earnestine Leys, BSN, RN Heart Failure Transport planner Only

## 2021-11-13 NOTE — Anesthesia Procedure Notes (Signed)
Procedure Name: MAC Date/Time: 11/13/2021 9:35 AM  Performed by: Amadeo Garnet, CRNAPre-anesthesia Checklist: Emergency Drugs available, Patient identified and Suction available Patient Re-evaluated:Patient Re-evaluated prior to induction Oxygen Delivery Method: Nasal cannula Preoxygenation: Pre-oxygenation with 100% oxygen Induction Type: IV induction Placement Confirmation: positive ETCO2 Dental Injury: Teeth and Oropharynx as per pre-operative assessment

## 2021-11-13 NOTE — Progress Notes (Signed)
Progress Note  Patient Name: Richard [name] Date of Encounter: 11/13/2021  [facility] Cardiologist: Werner Lean, MD   Subjective   Pt denies CP or dyspnea  Inpatient Medications    Scheduled Meds:  apixaban  5 mg Oral BID   carvedilol  12.5 mg Oral BID WC   dapagliflozin propanediol  10 mg Oral Daily   furosemide  40 mg Intravenous Daily   sacubitril-valsartan  1 tablet Oral BID   sodium chloride flush  3 mL Intravenous Q12H   spironolactone  25 mg Oral Daily   Continuous Infusions:  sodium chloride     PRN Meds: sodium chloride, acetaminophen, hydrALAZINE, ondansetron (ZOFRAN) IV, sodium chloride flush   Vital Signs    Vitals:   11/12/21 1913 11/13/21 0009 11/13/21 0446 11/13/21 0805  BP: 120/90 (!) 107/92 (!) 126/116 (!) 145/126  Pulse: 100  (!) 134 79  Resp: 18 18 18 20   Temp: 97.9 F (36.6 C) 97.9 F (36.6 C) 97.9 F (36.6 C) 97.9 F (36.6 C)  TempSrc: Oral Oral Oral Oral  SpO2: 100% 100% 99% 100%  Weight:   (!) 141.4 kg   Height:        Intake/Output Summary (Last 24 hours) at 11/13/2021 0812 Last data filed at 11/13/2021 0448 Gross per 24 hour  Intake 240 ml  Output 2375 ml  Net -2135 ml       11/13/2021    4:46 AM 11/12/2021    4:39 AM 11/11/2021    4:31 AM  Last 3 Weights  Weight (lbs) 311 lb 12.8 oz 310 lb 1.6 oz 314 lb  Weight (kg) 141.432 kg 140.66 kg 142.429 kg      Telemetry    Atrial fibrillation rate intermittently increased - Personally Reviewed  Physical Exam   GEN: NAD WD obese Neck: supple Cardiac: irregular, mildly tachycardic Respiratory: CTA; no wheeze GI: Soft, NT/ND, no masses MS: No edema Neuro:  no focal findings Psych: Normal affect   Labs    High Sensitivity Troponin:   Recent Labs  Lab 11/09/21 0054 11/09/21 0256  TROPONINIHS 466* 349*      Chemistry Recent Labs  Lab 11/09/21 0054 11/09/21 1520 11/10/21 0845 11/11/21 0237 11/12/21 0354 11/13/21 0334  NA 141 141   < > 138 140 137   K 3.5 3.7   < > 3.4* 3.5 4.0  CL 106 107   < > 103 103 104  CO2 22 24   < > 25 28 23   GLUCOSE 198* 201*   < > 143* 158* 137*  BUN 29* 31*   < > 25* 23* 23*  CREATININE 2.52* 2.51*   < > 1.90* 1.92* 1.92*  CALCIUM 8.5* 8.4*   < > 8.3* 8.0* 8.3*  MG 1.9  --   --   --   --   --   PROT 6.1* 6.0*  --   --   --   --   ALBUMIN 3.3* 3.2*  --   --   --   --   AST 39 37  --   --   --   --   ALT 51* 53*  --   --   --   --   ALKPHOS 45 38  --   --   --   --   BILITOT 0.7 0.7  --   --   --   --   GFRNONAA 30* 30*   < > 42* 42* 42*  ANIONGAP  13 10   < > 10 9 10    < > = values in this interval not displayed.      Hematology Recent Labs  Lab 11/09/21 0054 11/10/21 0845 11/13/21 0334  WBC 8.0  --  7.4  RBC 4.85 4.90 5.39  HGB 14.3  --  15.3  HCT 44.2  --  49.3  MCV 91.1  --  91.5  MCH 29.5  --  28.4  MCHC 32.4  --  31.0  RDW 14.7  --  14.6  PLT 207  --  224    Thyroid  Recent Labs  Lab 11/09/21 1520  TSH 1.293     BNP Recent Labs  Lab 11/09/21 0055  BNP 962.5*     Patient Profile     51 y.o. male with past medical history of nonischemic cardiomyopathy (previous improvement in ejection fraction but now again reduced), prior CVA, history of noncompliance, diabetes mellitus, chronic stage III kidney disease for evaluation of acute on chronic systolic congestive heart failure and newly diagnosed atrial fibrillation.  Echocardiogram this admission shows ejection fraction 25 to 30% with global hypokinesis, mild left ventricular enlargement, moderate pulmonary hypertension, mild left atrial enlargement.  Assessment & Plan    1 acute on chronic systolic congestive heart failure-I/O-2135; Wt 141.4 kg.  Patient remains euvolemic this morning.  Continue Lasix, spironolactone and Farxiga at present dose.  Continue to follow renal function.  2 history of nonischemic cardiomyopathy-LV function was reduced in the past with report of no coronary disease.  His LV function improved but now  is reduced again.  Possibly secondary to uncontrolled hypertension versus tachycardia mediated related atrial fibrillation.  Continue present dose of carvedilol and Entresto.  Follow blood pressure and titrate medications as tolerated.  Would plan to repeat echocardiogram 3 months after medications fully titrated in sinus rhythm reestablished.  If LV function remains decreased would need repeat ischemia evaluation.  3 persistent atrial fibrillation-patient has newly diagnosed atrial fibrillation with elevated rate at times.  Continue carvedilol for rate control.  Continue apixaban.  Plan TEE guided cardioversion today.  4 acute on chronic stage IIIa kidney disease-renal function unchanged today.  Continue to follow.  5 hypertension-Entresto recently resumed.  We will continue to follow blood pressure and advance as needed.  For questions or updates, please contact [place] Please consult [email or web address] for contact info under        Signed, Kirk Ruths, MD  11/13/2021, 8:12 AM

## 2021-11-13 NOTE — Progress Notes (Addendum)
Heart Failure Stewardship Pharmacist Progress Note   PCP: Maximiano Coss, NP PCP-Cardiologist: Werner Lean, MD    HPI:  51 y.o. male who presented after 3 weeks of lower extremity edema that has migrated to the abdomen.  This was associated with dyspnea on exertion.  He stated he has not been watching his fluid intake as carefully as he should've been recently. Prior to admission patient with known history of heart failure  (EF 20-25% in 05/2019) and was on Lasix, Entresto and spironolactone.  In the past patient has been unable to follow-up with primary cardiologist due to insurance concerns. He states Delene Loll was very expensive but he has been taking this as ordered. He has a few days left.   In the ER patient was found to have acute kidney injury, clinical signs consistent with acute heart failure noted 2 view chest x-ray revealed vascular congestion without edema.  BNP was elevated greater than 900 and troponin elevated which is consistent with likely demand ischemia.   Patient was found to have new onset atrial fib with RVR. Cardiology was consulted.  Rate control EDP had ordered IV amiodarone but this was discontinued by the cardiology physician.  Home carvedilol was continued but at a lower dose.    Patient had a prior amyloid work-up which was negative and a cath in 2014 that was negative for CAD. 11/09/2021 echo with EF 25-30%, RV normal.   ECHO 11/09/21 with LVEF 25-30%, RV normal, moderately elevated PA pressures.  Plan for TEE/DCCV today, and once restoration of NSR, will recheck ECHO in 3 months.   Current HF Medications: Diuretic: furosemide 40 mg IV daily Beta Blocker: carvedilol 12.5 mg BID ACE/ARB/ARNI: Entresto 24/26 mg BID Aldosterone Antagonist: spironolactone 25 mg daily SGLT2i: Farxiga 10 mg daily  Prior to admission HF Medications: Diuretic: furosemide 40 mg daily Beta blocker: carvedilol 25 mg BID ACE/ARB/ARNI: Entresto 24/26 mg BID Aldosterone  Antagonist: Spironolactone 12.5 mg daily SGLT2i: none Other: Kcl 40 mEq BID Dispense report indicates recent fills of all medications above except Entresto.   Pertinent Lab Values: Serum creatinine 1.92, BUN 23, Potassium 4.0, Sodium 137, BNP 962.5, Magnesium 1.9, A1c 6.4%  Vital Signs: Weight: 310 lbs (admission weight: 322 lbs) Blood pressure: 120/90s Heart rate: 110s (afib) - TEE/DCCV today I/O: -2.6L yesterday, net -10.1L  Medication Assistance / Insurance Benefits Check: Does the patient have prescription insurance?  Yes Type of insurance plan: Nurse, learning disability Has deductible left: $3,000 total - has paid $270 per [facility]  Outpatient Pharmacy:  Prior to admission outpatient pharmacy: [facility] Is the patient willing to use [place] pharmacy at discharge? Yes Is the patient willing to transition their outpatient pharmacy to utilize a [facility] outpatient pharmacy?   Pending   Assessment: 1. Acute on chronic systolic CHF (LVEF 83-15%), due to presumed NICM. TEE/DCCV today. Plan for repeat ECHO 3 months after NSR restored. NYHA class II symptoms. - Volume improved, down 12 lbs and net negative 10L since admission. Continue IV lasix 40 mg daily - Continue carvedilol 12.5 mg BID for rate control of AF with HR of > 100. TEE/DCCV today -BP and AKI improved. Continue Entresto 24/26 mg BID and spironolactone 25 mg daily -A1c 6.4%. New diagnosis of DM. Continue Farxiga 10 mg daily. -Consider starting digoxin 0.125 mg daily for additional HF and afib benefit -Lactic acid 1.9, persistent low EF, may need RHC before discharge to assess hemodynamics -Keep K >4, Mg >2 -Consider Lifevest with persistently low EF  but defer to cardiology with repeat ECHO planned in 3 months   Plan: 1) Medication changes recommended at this time: -Start Digoxin 0.125 mg daily -Consider Lifevest at discharge with persistently low EF -RHC this admission to assess hemodymaics  2) Patient  assistance: -Has $2730 left on deductible  -Eliquis - copay card will cover $6400 per year. Patient already provided with copay card. Copay $10 per month. Delene Loll - copay card will cover $3250 per year. Patient already provided with copay card. Copay $10 per fill (30-90 day supply) -Farxiga- PA approved - copay card will cover $175 per 30-day supply. Patient already provided with copay card. Copay $0 per month.  3)  Education  - To be completed prior to discharge   Kerby Nora, PharmD, BCPS Heart Failure Stewardship Pharmacist Phone [phone]  Please check [email or web address] for unit-specific pharmacist phone numbers

## 2021-11-13 NOTE — Progress Notes (Signed)
Patient back into afib rhythm rate up to 150s. MD notified. EKG completed.

## 2021-11-13 NOTE — TOC Benefit Eligibility Note (Signed)
Patient Research scientist (life sciences) completed.     The patient is currently admitted and upon discharge could be taking eliquis 5 mg.   The current 30 day co-pay is, $277.93.   The patient is currently admitted and upon discharge could be taking entresto 24/26 mg.   The current 30 day co-pay is, $575.68.   The patient is currently admitted and upon discharge could be taking farxiga 10 mg.   The current 30 day co-pay is, $15.   The patient is insured through [place] [employer].

## 2021-11-13 NOTE — Anesthesia Postprocedure Evaluation (Signed)
Anesthesia Post Note  Patient: Jet Armbrust  Procedure(s) Performed: TRANSESOPHAGEAL ECHOCARDIOGRAM (TEE) CARDIOVERSION     Patient location during evaluation: Endoscopy Anesthesia Type: MAC Level of consciousness: awake and alert Pain management: pain level controlled Vital Signs Assessment: post-procedure vital signs reviewed and stable Respiratory status: spontaneous breathing, nonlabored ventilation, respiratory function stable and patient connected to nasal cannula oxygen Cardiovascular status: blood pressure returned to baseline and stable Postop Assessment: no apparent nausea or vomiting Anesthetic complications: no   No notable events documented.  Last Vitals:  Vitals:   11/13/21 1036 11/13/21 1038  BP: 125/87 134/83  Pulse: 75 75  Resp: 18 (!) 22  Temp:    SpO2: 96% 96%    Last Pain:  Vitals:   11/13/21 1038  TempSrc:   PainSc: 0-No pain                 Ekaterina Denise DANIEL

## 2021-11-13 NOTE — Interval H&P Note (Signed)
History and Physical Interval Note:  11/13/2021 8:37 AM  Richard [name]  has presented today for surgery, with the diagnosis of afib.  The various methods of treatment have been discussed with the patient and family. After consideration of risks, benefits and other options for treatment, the patient has consented to  Procedure(s): TRANSESOPHAGEAL ECHOCARDIOGRAM (TEE) (N/A) CARDIOVERSION (N/A) as a surgical intervention.  The patient's history has been reviewed, patient examined, no change in status, stable for surgery.  I have reviewed the patient's chart and labs.  Questions were answered to the patient's satisfaction.     Skeet Latch, MD

## 2021-11-13 NOTE — Progress Notes (Deleted)
Cardiology Office Note    Date:  11/13/2021   ID:  Richardson Bing, DOB 1971-04-29, MRN [id number]   PCP:  Maximiano Coss, NP   [place]  Cardiologist:  Werner Lean, MD *** Advanced Practice Provider:  No care team member to display Electrophysiologist:  None   [phone]   No chief complaint on file.   History of Present Illness:  Richard [name] is a 51 y.o. male ***    Past Medical History:  Diagnosis Date   Acute decompensated heart failure ([place]) 01/03/2013   Allergy    CHF (congestive heart failure) (H[facility])    systolic   Chronic kidney disease    CKD (chronic kidney disease) stage 2, GFR 60-89 ml/min 01/04/2013   DM (diabetes mellitus) ([place]) 01/06/2013   Hypertension    hx htn emergency   Obesity    Stroke [facility])     Past Surgical History:  Procedure Laterality Date   BUBBLE STUDY  05/22/2019   Procedure: BUBBLE STUDY;  Surgeon: Skeet Latch, MD;  Location: [place];  Service: Cardiovascular;;   LEFT AND RIGHT HEART CATHETERIZATION WITH CORONARY ANGIOGRAM N/A 01/07/2013   Procedure: LEFT AND RIGHT HEART CATHETERIZATION WITH CORONARY ANGIOGRAM;  Surgeon: Birdie Riddle, MD;  Location: [facility] CATH LAB;  Service: Cardiovascular;  Laterality: N/A;   TEE WITHOUT CARDIOVERSION N/A 05/22/2019   Procedure: TRANSESOPHAGEAL ECHOCARDIOGRAM (TEE);  Surgeon: Skeet Latch, MD;  Location: [facility] ENDOSCOPY;  Service: Cardiovascular;  Laterality: N/A;    Current Medications: No outpatient medications have been marked as taking for the 11/15/21 encounter (Appointment) with Imogene Burn, PA-C.     Allergies:   Patient has no known allergies.   Social History   Socioeconomic History   Marital status: Single    Spouse name: Not on file   Number of children: 0   Years of education: Not on file   Highest education level: Bachelor's degree (e.g., BA, AB, BS)  Occupational History   Occupation: Production designer, theatre/television/film  Tobacco Use   Smoking  status: Never   Smokeless tobacco: Never  Vaping Use   Vaping Use: Never used  Substance and Sexual Activity   Alcohol use: No   Drug use: No   Sexual activity: Not on file  Other Topics Concern   Not on file  Social History Narrative   Work or School: Engineer, agricultural, Tax inspector      Home Situation: lives alone      Spiritual Beliefs: Christian      Lifestyle: 15 minutes dialy walking - working up; working on diet            Social Determinants of Radio broadcast assistant Strain: Low Risk  (11/13/2021)   Overall Financial Resource Strain (CARDIA)    Difficulty of Paying Living Expenses: Not very hard  Food Insecurity: No Food Insecurity (11/13/2021)   Hunger Vital Sign    Worried About Running Out of Food in the Last Year: Never true    [place] in the Last Year: Never true  Transportation Needs: No Transportation Needs (11/13/2021)   PRAPARE - Hydrologist (Medical): No    Lack of Transportation (Non-Medical): No  Physical Activity: Sufficiently Active (06/23/2019)   Exercise Vital Sign    Days of Exercise per Week: 5 days    Minutes of Exercise per Session: 30 min  Stress: Stress Concern Present (06/23/2019)   [employer] - Occupational Stress Questionnaire    Feeling of Stress : Rather much  Social Connections: Moderately Isolated (06/23/2019)   Social Connection and Isolation Panel [NHANES]    Frequency of Communication with Friends and Family: Three times a week    Frequency of Social Gatherings with Friends and Family: Three times a week    Attends Religious Services: 1 to 4 times per year    Active Member of Clubs or Organizations: No    Attends Archivist Meetings: Never    Marital Status: Never married     Family History:  The patient's ***family history includes Diabetes in his mother; Heart disease (age of onset: 53) in his father; Hyperlipidemia in his  father; Hypertension in his father and mother.   ROS:   Please see the history of present illness.    ROS All other systems reviewed and are negative.   PHYSICAL EXAM:   VS:  There were no vitals taken for this visit.  Physical Exam  GEN: Well nourished, well developed, in no acute distress  HEENT: normal  Neck: no JVD, carotid bruits, or masses Cardiac:RRR; no murmurs, rubs, or gallops  Respiratory:  clear to auscultation bilaterally, normal work of breathing GI: soft, nontender, nondistended, + BS Ext: without cyanosis, clubbing, or edema, Good distal pulses bilaterally MS: no deformity or atrophy  Skin: warm and dry, no rash Neuro:  Alert and Oriented x 3, Strength and sensation are intact Psych: euthymic mood, full affect  Wt Readings from Last 3 Encounters:  11/13/21 (!) 310 lb (140.6 kg)  11/08/20 279 lb (126.6 kg)  11/01/20 295 lb 6.4 oz (134 kg)      Studies/Labs Reviewed:   EKG:  EKG is*** ordered today.  The ekg ordered today demonstrates ***  Recent Labs: 11/09/2021: ALT 53; B Natriuretic Peptide 962.5; Magnesium 1.9; TSH 1.293 11/13/2021: BUN 23; Creatinine, Ser 1.92; Hemoglobin 15.3; Platelets 224; Potassium 4.0; Sodium 137   Lipid Panel    Component Value Date/Time   CHOL 149 06/10/2019 1014   TRIG 63 06/10/2019 1014   HDL 43 06/10/2019 1014   CHOLHDL 3.5 06/10/2019 1014   CHOLHDL 5.3 05/20/2019 0800   VLDL 21 05/20/2019 0800   LDLCALC 93 06/10/2019 1014    Additional studies/ records that were reviewed today include:  TEE 11/13/21  IMPRESSIONS     1. Severely reduced biventricular systolic function.   2. The aortic valve does not open with every ventricular contraction.   3. Left ventricular ejection fraction, by estimation, is <20%. The left  ventricle has severely decreased function. The left ventricle demonstrates  global hypokinesis.   4. Right ventricular systolic function is severely reduced. The right  ventricular size is normal.   5. No  left atrial/left atrial appendage thrombus was detected.   6. The mitral valve is normal in structure. Trivial mitral valve  regurgitation. No evidence of mitral stenosis.   7. The aortic valve is tricuspid. Aortic valve regurgitation is not  visualized. No aortic stenosis is present.   8. The inferior vena cava is normal in size with greater than 50%  respiratory variability, suggesting right atrial pressure of 3 mmHg.   Conclusion(s)/Recommendation(s): Normal biventricular function without  evidence of hemodynamically significant valvular heart disease.   FINDINGS   Left Ventricle: Left ventricular ejection fraction, by estimation, is  <20%. The left ventricle has severely decreased function. The left  ventricle demonstrates global hypokinesis. The left ventricular internal  cavity size was  normal in size. There is no   left ventricular hypertrophy.   Right Ventricle: The right ventricular size is normal. No increase in  right ventricular wall thickness. Right ventricular systolic function is  severely reduced.   Left Atrium: Left atrial size was normal in size. No left atrial/left  atrial appendage thrombus was detected.   Right Atrium: Right atrial size was normal in size. Prominent Chiari  network.   Pericardium: There is no evidence of pericardial effusion.   Mitral Valve: The mitral valve is normal in structure. Trivial mitral  valve regurgitation. No evidence of mitral valve stenosis.   Tricuspid Valve: The tricuspid valve is normal in structure. Tricuspid  valve regurgitation is not demonstrated. No evidence of tricuspid  stenosis.   Aortic Valve: The aortic valve is tricuspid. Aortic valve regurgitation is  not visualized. No aortic stenosis is present.   Pulmonic Valve: The pulmonic valve was normal in structure. Pulmonic valve  regurgitation is not visualized. No evidence of pulmonic stenosis.   Aorta: The aortic root is normal in size and structure.   Venous:  The inferior vena cava is normal in size with greater than 50%  respiratory variability, suggesting right atrial pressure of 3 mmHg.   IAS/Shunts: No atrial level shunt detected by color flow Doppler. There is  no evidence of a patent foramen ovale. There is no evidence of an atrial  septal defect.      TTE 11/09/21 IMPRESSIONS     1. Left ventricular ejection fraction, by estimation, is 25 to 30%. The  left ventricle has severely decreased function. The left ventricle  demonstrates global hypokinesis. The left ventricular internal cavity size  was mildly dilated. Left ventricular  diastolic function could not be evaluated.   2. Right ventricular systolic function is normal. The right ventricular  size is normal. There is moderately elevated pulmonary artery systolic  pressure.   3. Left atrial size was mildly dilated.   4. The mitral valve is normal in structure. Trivial mitral valve  regurgitation. No evidence of mitral stenosis.   5. The aortic valve is normal in structure. Aortic valve regurgitation is  not visualized. No aortic stenosis is present.   6. The inferior vena cava is dilated in size with <50% respiratory  variability, suggesting right atrial pressure of 15 mmHg.   FINDINGS   Left Ventricle: Left ventricular ejection fraction, by estimation, is 25  to 30%. The left ventricle has severely decreased function. The left  ventricle demonstrates global hypokinesis. The left ventricular internal  cavity size was mildly dilated. There  is no left ventricular hypertrophy. Left ventricular diastolic function  could not be evaluated due to atrial fibrillation. Left ventricular  diastolic function could not be evaluated.     LV Wall Scoring:  The entire apex and mid inferoseptal segment are akinetic. The anterior  wall,  antero-lateral wall, anterior septum, inferior wall, posterior wall, and  basal inferoseptal segment are hypokinetic.   Right Ventricle: The right  ventricular size is normal. No increase in  right ventricular wall thickness. Right ventricular systolic function is  normal. There is moderately elevated pulmonary artery systolic pressure.  The tricuspid regurgitant velocity is  3.26 m/s, and with an assumed right atrial pressure of 15 mmHg, the  estimated right ventricular systolic pressure is 64.3 mmHg.   Left Atrium: Left atrial size was mildly dilated.   Right Atrium: Right atrial size was normal in size.   Pericardium: There is no evidence of pericardial effusion.  Mitral Valve: The mitral valve is normal in structure. Trivial mitral  valve regurgitation. No evidence of mitral valve stenosis.   Tricuspid Valve: The tricuspid valve is normal in structure. Tricuspid  valve regurgitation is mild . No evidence of tricuspid stenosis.   Aortic Valve: The aortic valve is normal in structure. Aortic valve  regurgitation is not visualized. No aortic stenosis is present. Aortic  valve mean gradient measures 2.0 mmHg. Aortic valve peak gradient measures  4.0 mmHg. Aortic valve area, by VTI  measures 1.21 cm.   Pulmonic Valve: The pulmonic valve was normal in structure. Pulmonic valve  regurgitation is trivial. No evidence of pulmonic stenosis.   Aorta: The aortic root is normal in size and structure.   Venous: The inferior vena cava is dilated in size with less than 50%  respiratory variability, suggesting right atrial pressure of 15 mmHg.   IAS/Shunts: No atrial level shunt detected by color flow Doppler.       Risk Assessment/Calculations:   {Does this patient have ATRIAL FIBRILLATION?[phone]}     ASSESSMENT:    No diagnosis found.   PLAN:  In order of problems listed above:  chronic systolic congestive heart failure-I/O-2135; Wt 141.4 kg.   Continue Lasix, spironolactone and Farxiga at present dose.  Continue to follow renal function.   history of nonischemic cardiomyopathy-LV function was reduced in the  past with report of no coronary disease.  His LV function improved but now is reduced again.  Possibly secondary to uncontrolled hypertension versus tachycardia mediated related atrial fibrillation.  Continue present dose of carvedilol and Entresto.  Follow blood pressure and titrate medications as tolerated.  Would plan to repeat echocardiogram 3 months after medications fully titrated in sinus rhythm reestablished.  If LV function remains decreased would need repeat ischemia evaluation.    persistent atrial fibrillation-patient has newly diagnosed atrial fibrillation with elevated rate at times.  Continue carvedilol for rate control.  Continue apixaban.   TEE guided cardioversion done 11/13/21. EF 15%   acute on chronic stage IIIa kidney disease-renal function 1.92 11/13/21   hypertension-Entresto recently resumed.  We will continue to follow blood pressure and advance as needed.  Shared Decision Making/Informed Consent   {Are you ordering a CV Procedure (e.g. stress test, cath, DCCV, TEE, etc)?   Press F2        :[id number]}    Medication Adjustments/Labs and Tests Ordered: Current medicines are reviewed at length with the patient today.  Concerns regarding medicines are outlined above.  Medication changes, Labs and Tests ordered today are listed in the Patient Instructions below. There are no Patient Instructions on file for this visit.   Sumner Boast, PA-C  11/13/2021 2:47 PM    [facility] [address], [place], [place]  [address] Phone: [phone]; Fax: [phone]

## 2021-11-13 NOTE — H&P (View-Only) (Signed)
Progress Note  Patient Name: Richard [name] Date of Encounter: 11/13/2021  [facility] Cardiologist: Werner Lean, MD   Subjective   Pt denies CP or dyspnea  Inpatient Medications    Scheduled Meds:  apixaban  5 mg Oral BID   carvedilol  12.5 mg Oral BID WC   dapagliflozin propanediol  10 mg Oral Daily   furosemide  40 mg Intravenous Daily   sacubitril-valsartan  1 tablet Oral BID   sodium chloride flush  3 mL Intravenous Q12H   spironolactone  25 mg Oral Daily   Continuous Infusions:  sodium chloride     PRN Meds: sodium chloride, acetaminophen, hydrALAZINE, ondansetron (ZOFRAN) IV, sodium chloride flush   Vital Signs    Vitals:   11/12/21 1913 11/13/21 0009 11/13/21 0446 11/13/21 0805  BP: 120/90 (!) 107/92 (!) 126/116 (!) 145/126  Pulse: 100  (!) 134 79  Resp: 18 18 18 20   Temp: 97.9 F (36.6 C) 97.9 F (36.6 C) 97.9 F (36.6 C) 97.9 F (36.6 C)  TempSrc: Oral Oral Oral Oral  SpO2: 100% 100% 99% 100%  Weight:   (!) 141.4 kg   Height:        Intake/Output Summary (Last 24 hours) at 11/13/2021 0812 Last data filed at 11/13/2021 0448 Gross per 24 hour  Intake 240 ml  Output 2375 ml  Net -2135 ml       11/13/2021    4:46 AM 11/12/2021    4:39 AM 11/11/2021    4:31 AM  Last 3 Weights  Weight (lbs) 311 lb 12.8 oz 310 lb 1.6 oz 314 lb  Weight (kg) 141.432 kg 140.66 kg 142.429 kg      Telemetry    Atrial fibrillation rate intermittently increased - Personally Reviewed  Physical Exam   GEN: NAD WD obese Neck: supple Cardiac: irregular, mildly tachycardic Respiratory: CTA; no wheeze GI: Soft, NT/ND, no masses MS: No edema Neuro:  no focal findings Psych: Normal affect   Labs    High Sensitivity Troponin:   Recent Labs  Lab 11/09/21 0054 11/09/21 0256  TROPONINIHS 466* 349*      Chemistry Recent Labs  Lab 11/09/21 0054 11/09/21 1520 11/10/21 0845 11/11/21 0237 11/12/21 0354 11/13/21 0334  NA 141 141   < > 138 140 137   K 3.5 3.7   < > 3.4* 3.5 4.0  CL 106 107   < > 103 103 104  CO2 22 24   < > 25 28 23   GLUCOSE 198* 201*   < > 143* 158* 137*  BUN 29* 31*   < > 25* 23* 23*  CREATININE 2.52* 2.51*   < > 1.90* 1.92* 1.92*  CALCIUM 8.5* 8.4*   < > 8.3* 8.0* 8.3*  MG 1.9  --   --   --   --   --   PROT 6.1* 6.0*  --   --   --   --   ALBUMIN 3.3* 3.2*  --   --   --   --   AST 39 37  --   --   --   --   ALT 51* 53*  --   --   --   --   ALKPHOS 45 38  --   --   --   --   BILITOT 0.7 0.7  --   --   --   --   GFRNONAA 30* 30*   < > 42* 42* 42*  ANIONGAP  13 10   < > 10 9 10    < > = values in this interval not displayed.      Hematology Recent Labs  Lab 11/09/21 0054 11/10/21 0845 11/13/21 0334  WBC 8.0  --  7.4  RBC 4.85 4.90 5.39  HGB 14.3  --  15.3  HCT 44.2  --  49.3  MCV 91.1  --  91.5  MCH 29.5  --  28.4  MCHC 32.4  --  31.0  RDW 14.7  --  14.6  PLT 207  --  224    Thyroid  Recent Labs  Lab 11/09/21 1520  TSH 1.293     BNP Recent Labs  Lab 11/09/21 0055  BNP 962.5*     Patient Profile     51 y.o. male with past medical history of nonischemic cardiomyopathy (previous improvement in ejection fraction but now again reduced), prior CVA, history of noncompliance, diabetes mellitus, chronic stage III kidney disease for evaluation of acute on chronic systolic congestive heart failure and newly diagnosed atrial fibrillation.  Echocardiogram this admission shows ejection fraction 25 to 30% with global hypokinesis, mild left ventricular enlargement, moderate pulmonary hypertension, mild left atrial enlargement.  Assessment & Plan    1 acute on chronic systolic congestive heart failure-I/O-2135; Wt 141.4 kg.  Patient remains euvolemic this morning.  Continue Lasix, spironolactone and Farxiga at present dose.  Continue to follow renal function.  2 history of nonischemic cardiomyopathy-LV function was reduced in the past with report of no coronary disease.  His LV function improved but now  is reduced again.  Possibly secondary to uncontrolled hypertension versus tachycardia mediated related atrial fibrillation.  Continue present dose of carvedilol and Entresto.  Follow blood pressure and titrate medications as tolerated.  Would plan to repeat echocardiogram 3 months after medications fully titrated in sinus rhythm reestablished.  If LV function remains decreased would need repeat ischemia evaluation.  3 persistent atrial fibrillation-patient has newly diagnosed atrial fibrillation with elevated rate at times.  Continue carvedilol for rate control.  Continue apixaban.  Plan TEE guided cardioversion today.  4 acute on chronic stage IIIa kidney disease-renal function unchanged today.  Continue to follow.  5 hypertension-Entresto recently resumed.  We will continue to follow blood pressure and advance as needed.  For questions or updates, please contact [facility] Please consult [email or web address] for contact info under        Signed, Kirk Ruths, MD  11/13/2021, 8:12 AM

## 2021-11-13 NOTE — CV Procedure (Signed)
Brief TEE Note  LVEF 15-20% Aortic valve did not open with each ventricular contraction RV systolic function severely reduced Moderate tricuspid regurgitation Trivial mitral regurgitation No LA/LAA thrombus or masses  For additional details see full report.   Electrical Cardioversion Procedure Note Salem Lembke [id number] Aug 25, 1970  Procedure: Electrical Cardioversion Indications:  Atrial Fibrillation  Procedure Details Consent: Risks of procedure as well as the alternatives and risks of each were explained to the (patient/caregiver).  Consent for procedure obtained. Time Out: Verified patient identification, verified procedure, site/side was marked, verified correct patient position, special equipment/implants available, medications/allergies/relevent history reviewed, required imaging and test results available.  Performed  Patient placed on cardiac monitor, pulse oximetry, supplemental oxygen as necessary.  Sedation given:  propofol Pacer pads placed anterior and posterior chest.  Cardioverted 1 time(s).  Cardioverted at [place].  Evaluation Findings: Post procedure EKG shows: NSR Complications: None Patient did tolerate procedure well.   Skeet Latch, MD 11/13/2021, 9:58 AM

## 2021-11-13 NOTE — Anesthesia Preprocedure Evaluation (Addendum)
Anesthesia Evaluation  Patient identified by MRN, date of birth, ID band Patient awake    Reviewed: Allergy & Precautions, NPO status , Patient's Chart, lab work & pertinent test results  History of Anesthesia Complications Negative for: history of anesthetic complications  Airway Mallampati: II  TM Distance: >3 FB Neck ROM: Full    Dental no notable dental hx. (+) Dental Advisory Given Braces:   Pulmonary neg pulmonary ROS,    Pulmonary exam normal        Cardiovascular hypertension, Pt. on medications and Pt. on home beta blockers +CHF   Rhythm:Irregular Rate:Normal  heart failure with reduced EF 20-25%, HTN, CVA, DM type II,   Neuro/Psych TIA   GI/Hepatic negative GI ROS, Neg liver ROS,   Endo/Other  diabetesMorbid obesity  Renal/GU Renal InsufficiencyRenal disease     Musculoskeletal negative musculoskeletal ROS (+)   Abdominal   Peds  Hematology negative hematology ROS (+)   Anesthesia Other Findings   Reproductive/Obstetrics                           Anesthesia Physical Anesthesia Plan  ASA: 3  Anesthesia Plan: MAC   Post-op Pain Management: Minimal or no pain anticipated   Induction:   PONV Risk Score and Plan: Propofol infusion  Airway Management Planned: Natural Airway  Additional Equipment:   Intra-op Plan:   Post-operative Plan:   Informed Consent: I have reviewed the patients History and Physical, chart, labs and discussed the procedure including the risks, benefits and alternatives for the proposed anesthesia with the patient or authorized representative who has indicated his/her understanding and acceptance.     Dental advisory given  Plan Discussed with: Anesthesiologist, CRNA and Surgeon  Anesthesia Plan Comments:        Anesthesia Quick Evaluation

## 2021-11-14 DIAGNOSIS — I4819 Other persistent atrial fibrillation: Secondary | ICD-10-CM

## 2021-11-14 DIAGNOSIS — I4891 Unspecified atrial fibrillation: Secondary | ICD-10-CM | POA: Diagnosis not present

## 2021-11-14 DIAGNOSIS — I5021 Acute systolic (congestive) heart failure: Secondary | ICD-10-CM | POA: Diagnosis not present

## 2021-11-14 LAB — BASIC METABOLIC PANEL
Anion gap: 11 (ref 5–15)
BUN: 24 mg/dL — ABNORMAL HIGH (ref 6–20)
CO2: 22 mmol/L (ref 22–32)
Calcium: 8.4 mg/dL — ABNORMAL LOW (ref 8.9–10.3)
Chloride: 103 mmol/L (ref 98–111)
Creatinine, Ser: 1.69 mg/dL — ABNORMAL HIGH (ref 0.61–1.24)
GFR, Estimated: 49 mL/min — ABNORMAL LOW (ref >60)
Glucose, Bld: 149 mg/dL — ABNORMAL HIGH (ref 70–99)
Potassium: 4.5 mmol/L (ref 3.5–5.1)
Sodium: 136 mmol/L (ref 135–145)

## 2021-11-14 MED ORDER — CARVEDILOL 25 MG PO TABS: 25.0000 mg | ORAL_TABLET | Freq: Two times a day (BID) | ORAL | Status: DC

## 2021-11-14 MED ORDER — AMIODARONE HCL IN DEXTROSE 360-4.14 MG/200ML-% IV SOLN
30.0000 mg/h | INTRAVENOUS | Status: DC
  Administered 2021-11-15: 30 mg/h via INTRAVENOUS
  Filled 2021-11-14: qty 200

## 2021-11-14 MED ORDER — DIGOXIN 125 MCG PO TABS
0.1250 mg | ORAL_TABLET | Freq: Every day | ORAL | Status: DC
  Administered 2021-11-14: 0.125 mg via ORAL
  Filled 2021-11-14: qty 1

## 2021-11-14 MED ORDER — AMIODARONE LOAD VIA INFUSION
150.0000 mg | Freq: Once | INTRAVENOUS | Status: AC
  Administered 2021-11-14: 150 mg via INTRAVENOUS
  Filled 2021-11-14: qty 83.34

## 2021-11-14 MED ORDER — AMIODARONE HCL IN DEXTROSE 360-4.14 MG/200ML-% IV SOLN
60.0000 mg/h | INTRAVENOUS | Status: AC
  Administered 2021-11-14 (×2): 60 mg/h via INTRAVENOUS
  Filled 2021-11-14 (×2): qty 200

## 2021-11-14 MED ORDER — CARVEDILOL 12.5 MG PO TABS
12.5000 mg | ORAL_TABLET | Freq: Two times a day (BID) | ORAL | Status: DC
  Administered 2021-11-14 – 2021-11-15 (×2): 12.5 mg via ORAL
  Filled 2021-11-14 (×2): qty 1

## 2021-11-14 MED ORDER — FUROSEMIDE 20 MG PO TABS
60.0000 mg | ORAL_TABLET | Freq: Every day | ORAL | Status: DC
  Administered 2021-11-14 – 2021-11-15 (×2): 60 mg via ORAL
  Filled 2021-11-14 (×2): qty 1

## 2021-11-14 NOTE — Progress Notes (Signed)
PROGRESS NOTE    Richard [name]  SRP:[id number] DOB: 1970/11/21 DOA: 11/09/2021 PCP: Richard Coss, NP  51 y.o. male with a hx of  NICM and HFrEF (EF 20-25%), CVA, type 2 diabetes mellitus, CKD 3a, obesity presented to the ED with worsening shortness of breath, orthopnea and abdominal swelling, BNP was 962, troponin 466, chest x-ray with pulmonary vascular congestion, also noted to be in new onset A-fib with RVR,  -Improving on diuretics, underwent TEE/DC cardioversion 6/12, back in rapid A-fib unfortunately  Subjective: -Feels well, breathing improving, upset about being back in rapid A-fib today  Assessment and Plan:  Acute combined systolic and diastolic heart failure -EF was previously 20-25%, subsequently improved and then reduced again in 2017, cath in 2014 was negative for obstruction, -Inconsistent medication use, in the setting of losing insurance in between -Repeat echo with EF 25-30%, RV function is normal, moderate PAH -Cardiology following, he is 10.5 L negative, weight down 12 pounds -Switch to oral Lasix today, also started on Aldactone, Farxiga and Entresto this admission, continue carvedilol -TOC clinic follow-up arranged, unfortunately back in A-fib  New onset atrial fib with RVR -New, heart rate suboptimally controlled, continue carvedilol -Started on Eliquis, underwent TEE DC cardioversion yesterday, back in A-fib yesterday afternoon, overnight with heart rate up to 150s -EP consulted today, starting IV amiodarone   Acute kidney injury on stage IIIb chronic kidney disease Baseline creatinine 1.62, was 2.5 on admission -Suspect cardiorenal physiology -Improving   Hypertension -Improving, continue Coreg and diuretics  History of CVA Aspirin on hold, now started on Eliquis   Prediabetes Hemoglobin A1c in 2017 was 8.7.  Now 6.4,, started on SGLT2i this admission   Obesity Counseling for weight reduction recommended in the outpatient Body mass index is 41.51  kg/m.     DVT prophylaxis: Eliquis Code Status: Full code Family Communication: Discussed patient detail, no family at bedside Disposition Plan: Home likely tomorrow if stable  Consultants: Cards   Procedures: TEE, DC cardioversion 6/12  Antimicrobials:    Objective: Vitals:   11/14/21 0530 11/14/21 0821 11/14/21 1123 11/14/21 1336  BP: (!) 139/116 (!) 130/117 (!) 129/111 (!) 119/94  Pulse: 72 83 79 (!) 120  Resp: 18 18    Temp: 98.5 F (36.9 C) 97.7 F (36.5 C) 97.7 F (36.5 C)   TempSrc: Oral  Oral   SpO2: 98% 99% 100%   Weight: (!) 141.3 kg     Height:        Intake/Output Summary (Last 24 hours) at 11/14/2021 1516 Last data filed at 11/14/2021 1334 Gross per 24 hour  Intake 720 ml  Output 1800 ml  Net -1080 ml   Filed Weights   11/13/21 0446 11/13/21 0908 11/14/21 0530  Weight: (!) 141.4 kg (!) 140.6 kg (!) 141.3 kg    Examination:  General exam: Obese obese pleasant male sitting up in recliner, AAOx3, no distress HEENT: No JVD CVS: S1-S2, irregularly irregular rhythm Lungs: Improving air movement, decreased breath sounds the bases Abdomen: Soft, obese, nontender, bowel sounds present Extremities: Trace edema  Skin: No rashes Psychiatry:  Mood & affect appropriate.     Data Reviewed:   CBC: Recent Labs  Lab 11/09/21 0054 11/13/21 0334  WBC 8.0 7.4  NEUTROABS 4.8  --   HGB 14.3 15.3  HCT 44.2 49.3  MCV 91.1 91.5  PLT 207 244   Basic Metabolic Panel: Recent Labs  Lab 11/09/21 0054 11/09/21 1520 11/10/21 0845 11/11/21 0237 11/12/21 0354 11/13/21 0334 11/14/21 0310  NA 141   < > 137 138 140 137 136  K 3.5   < > 3.4* 3.4* 3.5 4.0 4.5  CL 106   < > 105 103 103 104 103  CO2 22   < > 21* 25 28 23 22   GLUCOSE 198*   < > 210* 143* 158* 137* 149*  BUN 29*   < > 30* 25* 23* 23* 24*  CREATININE 2.52*   < > 2.34* 1.90* 1.92* 1.92* 1.69*  CALCIUM 8.5*   < > 8.3* 8.3* 8.0* 8.3* 8.4*  MG 1.9  --   --   --   --   --   --    < > = values in  this interval not displayed.   GFR: Estimated Creatinine Clearance: 74.4 mL/min (A) (by C-G formula based on SCr of 1.69 mg/dL (H)). Liver Function Tests: Recent Labs  Lab 11/09/21 0054 11/09/21 1520  AST 39 37  ALT 51* 53*  ALKPHOS 45 38  BILITOT 0.7 0.7  PROT 6.1* 6.0*  ALBUMIN 3.3* 3.2*   No results for input(s): "LIPASE", "AMYLASE" in the last 168 hours. No results for input(s): "AMMONIA" in the last 168 hours. Coagulation Profile: Recent Labs  Lab 11/13/21 1114  INR 1.1   Cardiac Enzymes: No results for input(s): "CKTOTAL", "CKMB", "CKMBINDEX", "TROPONINI" in the last 168 hours. BNP (last 3 results) No results for input(s): "PROBNP" in the last 8760 hours. HbA1C: No results for input(s): "HGBA1C" in the last 72 hours.  CBG: Recent Labs  Lab 11/11/21 1109 11/11/21 1546  GLUCAP 155* 125*   Lipid Profile: No results for input(s): "CHOL", "HDL", "LDLCALC", "TRIG", "CHOLHDL", "LDLDIRECT" in the last 72 hours. Thyroid Function Tests: No results for input(s): "TSH", "T4TOTAL", "FREET4", "T3FREE", "THYROIDAB" in the last 72 hours.  Anemia Panel: No results for input(s): "VITAMINB12", "FOLATE", "FERRITIN", "TIBC", "IRON", "RETICCTPCT" in the last 72 hours.  Urine analysis:    Component Value Date/Time   APPEARANCEUR Clear 06/10/2019 1014   GLUCOSEU Negative 06/10/2019 1014   BILIRUBINUR Negative 06/10/2019 1014   PROTEINUR 2+ (A) 06/10/2019 1014   UROBILINOGEN 1.0 01/03/2013 1809   NITRITE Negative 06/10/2019 1014   LEUKOCYTESUR Negative 06/10/2019 1014   Sepsis Labs: @LABRCNTIP (procalcitonin:4,lacticidven:4)  )No results found for this or any previous visit (from the past 240 hour(s)).   Radiology Studies: ECHO TEE  Result Date: 11/13/2021    TRANSESOPHOGEAL ECHO REPORT   Patient Name:   Richard [name] Date of Exam: 11/13/2021 Medical Rec #:  [id number]     Height:       71.0 in Accession #:    [id number]    Weight:       310.0 lb Date of Birth:  [birth year]      BSA:          2.540 m Patient Age:    57 years      BP:           118/104 mmHg Patient Gender: M             HR:           139 bpm. Exam Location:  Inpatient Procedure: Transesophageal Echo, Color Doppler and Cardiac Doppler Indications:     I48.91* Unspecified atrial fibrillation  History:         Patient has prior history of Echocardiogram examinations, most                  recent 11/09/2021. CHF; Risk Factors:Hypertension and Diabetes.  Sonographer:     Raquel Sarna Senior RDCS Referring Phys:  [id number] Darreld Mclean Diagnosing Phys: Skeet Latch MD PROCEDURE: After discussion of the risks and benefits of a TEE, an informed consent was obtained from the patient. The transesophogeal probe was passed without difficulty through the esophogus of the patient. Sedation performed by different physician. The patient was monitored while under deep sedation. Anesthestetic sedation was provided intravenously by Anesthesiology: 352mg  of Propofol, 100mg  of Lidocaine. The patient's vital signs; including heart rate, blood pressure, and oxygen saturation; remained stable throughout the procedure. The patient developed no complications during the procedure. IMPRESSIONS  1. Severely reduced biventricular systolic function.  2. The aortic valve does not open with every ventricular contraction.  3. Left ventricular ejection fraction, by estimation, is <20%. The left ventricle has severely decreased function. The left ventricle demonstrates global hypokinesis.  4. Right ventricular systolic function is severely reduced. The right ventricular size is normal.  5. No left atrial/left atrial appendage thrombus was detected.  6. The mitral valve is normal in structure. Trivial mitral valve regurgitation. No evidence of mitral stenosis.  7. The aortic valve is tricuspid. Aortic valve regurgitation is not visualized. No aortic stenosis is present.  8. The inferior vena cava is normal in size with greater than 50% respiratory variability,  suggesting right atrial pressure of 3 mmHg. Conclusion(s)/Recommendation(s): Normal biventricular function without evidence of hemodynamically significant valvular heart disease. FINDINGS  Left Ventricle: Left ventricular ejection fraction, by estimation, is <20%. The left ventricle has severely decreased function. The left ventricle demonstrates global hypokinesis. The left ventricular internal cavity size was normal in size. There is no  left ventricular hypertrophy. Right Ventricle: The right ventricular size is normal. No increase in right ventricular wall thickness. Right ventricular systolic function is severely reduced. Left Atrium: Left atrial size was normal in size. No left atrial/left atrial appendage thrombus was detected. Right Atrium: Right atrial size was normal in size. Prominent Chiari network. Pericardium: There is no evidence of pericardial effusion. Mitral Valve: The mitral valve is normal in structure. Trivial mitral valve regurgitation. No evidence of mitral valve stenosis. Tricuspid Valve: The tricuspid valve is normal in structure. Tricuspid valve regurgitation is not demonstrated. No evidence of tricuspid stenosis. Aortic Valve: The aortic valve is tricuspid. Aortic valve regurgitation is not visualized. No aortic stenosis is present. Pulmonic Valve: The pulmonic valve was normal in structure. Pulmonic valve regurgitation is not visualized. No evidence of pulmonic stenosis. Aorta: The aortic root is normal in size and structure. Venous: The inferior vena cava is normal in size with greater than 50% respiratory variability, suggesting right atrial pressure of 3 mmHg. IAS/Shunts: No atrial level shunt detected by color flow Doppler. There is no evidence of a patent foramen ovale. There is no evidence of an atrial septal defect.  TRICUSPID VALVE TR Peak grad:   24.8 mmHg TR Vmax:        249.00 cm/s Skeet Latch MD Electronically signed by Skeet Latch MD Signature Date/Time:  11/13/2021/12:38:10 PM    Final      Scheduled Meds:  apixaban  5 mg Oral BID   carvedilol  12.5 mg Oral BID WC   dapagliflozin propanediol  10 mg Oral Daily   digoxin  0.125 mg Oral Daily   furosemide  60 mg Oral Daily   sacubitril-valsartan  1 tablet Oral BID   sodium chloride flush  3 mL Intravenous Q12H   spironolactone  25 mg Oral Daily   Continuous Infusions:  sodium  chloride     amiodarone 60 mg/hr (11/14/21 1333)   Followed by   amiodarone       LOS: 5 days    Time spent: 47min  Domenic Polite, MD [facility]   11/14/2021, 3:16 PM

## 2021-11-14 NOTE — Progress Notes (Addendum)
Progress Note  Patient Name: Richard [name] Date of Encounter: 11/14/2021  [facility] Cardiologist: Werner Lean, MD   Subjective   No CP or dyspnea  Inpatient Medications    Scheduled Meds:  apixaban  5 mg Oral BID   carvedilol  12.5 mg Oral BID WC   dapagliflozin propanediol  10 mg Oral Daily   furosemide  40 mg Intravenous Daily   sacubitril-valsartan  1 tablet Oral BID   sodium chloride flush  3 mL Intravenous Q12H   spironolactone  25 mg Oral Daily   Continuous Infusions:  sodium chloride     PRN Meds: sodium chloride, acetaminophen, hydrALAZINE, ondansetron (ZOFRAN) IV, sodium chloride flush   Vital Signs    Vitals:   11/13/21 2047 11/14/21 0100 11/14/21 0530 11/14/21 0821  BP: 129/83 (!) 137/109 (!) 139/116 (!) 130/117  Pulse: 88 63 72 83  Resp: 18 18 18 18   Temp: 98.3 F (36.8 C) 98 F (36.7 C) 98.5 F (36.9 C) 97.7 F (36.5 C)  TempSrc: Oral Oral Oral   SpO2: 99% 97% 98% 99%  Weight:   (!) 141.3 kg   Height:        Intake/Output Summary (Last 24 hours) at 11/14/2021 0836 Last data filed at 11/14/2021 2094 Gross per 24 hour  Intake 1020 ml  Output 1150 ml  Net -130 ml       11/14/2021    5:30 AM 11/13/2021    9:08 AM 11/13/2021    4:46 AM  Last 3 Weights  Weight (lbs) 311 lb 6.4 oz 310 lb 311 lb 12.8 oz  Weight (kg) 141.25 kg 140.615 kg 141.432 kg      Telemetry    Atrial fibrillation - Personally Reviewed  Physical Exam   GEN: NAD Neck: supple, no JVD Cardiac: irregular Respiratory: CTA; no rhonchi GI: Soft, NT/ND MS: No edema Neuro:  Grossly intact Psych: Normal affect   Labs    High Sensitivity Troponin:   Recent Labs  Lab 11/09/21 0054 11/09/21 0256  TROPONINIHS 466* 349*      Chemistry Recent Labs  Lab 11/09/21 0054 11/09/21 1520 11/10/21 0845 11/12/21 0354 11/13/21 0334 11/14/21 0310  NA 141 141   < > 140 137 136  K 3.5 3.7   < > 3.5 4.0 4.5  CL 106 107   < > 103 104 103  CO2 22 24   < > 28  23 22   GLUCOSE 198* 201*   < > 158* 137* 149*  BUN 29* 31*   < > 23* 23* 24*  CREATININE 2.52* 2.51*   < > 1.92* 1.92* 1.69*  CALCIUM 8.5* 8.4*   < > 8.0* 8.3* 8.4*  MG 1.9  --   --   --   --   --   PROT 6.1* 6.0*  --   --   --   --   ALBUMIN 3.3* 3.2*  --   --   --   --   AST 39 37  --   --   --   --   ALT 51* 53*  --   --   --   --   ALKPHOS 45 38  --   --   --   --   BILITOT 0.7 0.7  --   --   --   --   GFRNONAA 30* 30*   < > 42* 42* 49*  ANIONGAP 13 10   < > 9 10 11    < > =  values in this interval not displayed.      Hematology Recent Labs  Lab 11/09/21 0054 11/10/21 0845 11/13/21 0334  WBC 8.0  --  7.4  RBC 4.85 4.90 5.39  HGB 14.3  --  15.3  HCT 44.2  --  49.3  MCV 91.1  --  91.5  MCH 29.5  --  28.4  MCHC 32.4  --  31.0  RDW 14.7  --  14.6  PLT 207  --  224    Thyroid  Recent Labs  Lab 11/09/21 1520  TSH 1.293     BNP Recent Labs  Lab 11/09/21 0055  BNP 962.5*     Patient Profile     51 y.o. male with past medical history of nonischemic cardiomyopathy (previous improvement in ejection fraction but now again reduced), prior CVA, history of noncompliance, diabetes mellitus, chronic stage III kidney disease for evaluation of acute on chronic systolic congestive heart failure and newly diagnosed atrial fibrillation.  Echocardiogram this admission shows ejection fraction 25 to 30% with global hypokinesis, mild left ventricular enlargement, moderate pulmonary hypertension, mild left atrial enlargement.  Assessment & Plan    1 acute on chronic systolic congestive heart failure-[id number]/[id number] since admission; Wt 141.3 kg.  Patient remains euvolemic this morning.  Change Lasix to 60 mg by mouth daily.  Continue spironolactone and Farxiga at present dose.  Continue to follow renal function.  2 history of nonischemic cardiomyopathy-LV function was reduced in the past with report of no coronary disease.  His LV function improved but now is reduced again.  Possibly  secondary to uncontrolled hypertension versus tachycardia mediated related atrial fibrillation.  Continue Entresto and coreg.  Follow blood pressure and titrate medications as tolerated.  Would plan to repeat echocardiogram 3 months after medications fully titrated and sinus rhythm reestablished.  If LV function remains decreased would need repeat ischemia evaluation.  3 persistent atrial fibrillation-patient underwent TEE guided cardioversion yesterday.  However atrial fibrillation has recurred and his rate is elevated.  Continue carvedilol at present dose.  Add digoxin 0.125 mg daily.  Follow heart rate and advance medications as needed.  Continue apixaban.  I have discussed the patient with Dr. Quentin Ore and electrophysiology will review.  I do not think he would be a good candidate for Tikosyn given baseline renal insufficiency.  Would like to avoid amiodarone long-term given young age.  Best option likely ablation.    4 acute on chronic stage IIIa kidney disease-renal function unchanged today.  Continue to follow.  5 hypertension-blood pressure remains elevated.  I am increasing carvedilol both for blood pressure and heart rate control of atrial fibrillation.  For questions or updates, please contact [place] Please consult [email or web address] for contact info under        Signed, Kirk Ruths, MD  11/14/2021, 8:36 AM

## 2021-11-14 NOTE — Progress Notes (Signed)
Heart Failure Stewardship Pharmacist Progress Note   PCP: Maximiano Coss, NP PCP-Cardiologist: Werner Lean, MD    HPI:  51 y.o. male who presented after 3 weeks of lower extremity edema that has migrated to the abdomen.  This was associated with dyspnea on exertion.  He stated he has not been watching his fluid intake as carefully as he should've been recently. Prior to admission patient with known history of heart failure  (EF 20-25% in 05/2019) and was on Lasix, Entresto and spironolactone.  In the past patient has been unable to follow-up with primary cardiologist due to insurance concerns. He states Delene Loll was very expensive but he has been taking this as ordered. He has a few days left.   In the ER patient was found to have acute kidney injury, clinical signs consistent with acute heart failure noted 2 view chest x-ray revealed vascular congestion without edema.  BNP was elevated greater than 900 and troponin elevated which is consistent with likely demand ischemia.   Patient was found to have new onset atrial fib with RVR. Cardiology was consulted.  Rate control EDP had ordered IV amiodarone but this was discontinued by the cardiology physician.  Home carvedilol was continued but at a lower dose.    Patient had a prior amyloid work-up which was negative and a cath in 2014 that was negative for CAD. 11/09/2021 echo with EF 25-30%, RV normal.   ECHO 11/09/21 with LVEF 25-30%, RV normal, moderately elevated PA pressures.  S/p for TEE/DCCV 6/12, and once restoration of NSR, will recheck ECHO in 3 months. However, patient flipped back to afib later that afternoon.  Current HF Medications: Diuretic: furosemide 40 mg IV daily Beta Blocker: carvedilol 12.5 mg BID ACE/ARB/ARNI: Entresto 24/26 mg BID Aldosterone Antagonist: spironolactone 25 mg daily SGLT2i: Farxiga 10 mg daily  Prior to admission HF Medications: Diuretic: furosemide 40 mg daily Beta blocker: carvedilol 25 mg  BID ACE/ARB/ARNI: Entresto 24/26 mg BID Aldosterone Antagonist: Spironolactone 12.5 mg daily SGLT2i: none Other: Kcl 40 mEq BID Dispense report indicates recent fills of all medications above except Entresto.   Pertinent Lab Values: Serum creatinine 1.69, BUN 24, Potassium 4.5, Sodium 136, BNP 962.5, Magnesium 1.9, A1c 6.4%  Vital Signs: Weight: 311 lbs (admission weight: 322 lbs) Blood pressure: 130/90s Heart rate: 70-110s (afib) I/O: -1L yesterday, net -10.6L  Medication Assistance / Insurance Benefits Check: Does the patient have prescription insurance?  Yes Type of insurance plan: Nurse, learning disability Has deductible left: $3,000 total - has paid $270 per [facility]  Outpatient Pharmacy:  Prior to admission outpatient [facility] Is the patient willing to use [place] pharmacy at discharge? Yes Is the patient willing to transition their outpatient pharmacy to utilize a [facility] outpatient pharmacy?   Pending   Assessment: 1. Acute on chronic systolic CHF (LVEF 32-95%), due to presumed NICM. S/p unsuccessful TEE/DCCV. Plan was for repeat ECHO 3 months after NSR restored. NYHA class II symptoms. - Volume improved, down 11 lbs and net negative 10.6L since admission. Continue IV lasix 40 mg daily - Continue carvedilol 12.5 mg BID for rate control of AF. TEE/DCCV unsuccessful yesterday -BP and AKI improved. Continue Entresto 24/26 mg BID and spironolactone 25 mg daily -A1c 6.4%. New diagnosis of DM. Continue Farxiga 10 mg daily. -Consider starting digoxin 0.125 mg daily for additional HF and afib benefit -Lactic acid 1.9, persistent low EF, may need RHC before discharge to assess hemodynamics -Keep K >4, Mg >2 -Consider Lifevest with  persistently low EF but defer to cardiology with repeat ECHO planned in 3 months   Plan: 1) Medication changes recommended at this time: -Start Digoxin 0.125 mg daily -Consider Lifevest at discharge with persistently low EF -RHC this  admission to assess hemodymaics  2) Patient assistance: -Has $2730 left on deductible  -Eliquis - copay card will cover $6400 per year. Patient already provided with copay card. Copay $10 per month. Delene Loll - copay card will cover $3250 per year. Patient already provided with copay card. Copay $10 per fill (30-90 day supply) -Farxiga- [place] approved - copay card will cover $175 per 30-day supply. Patient already provided with copay card. Copay $0 per month.  3)  Education  - To be completed prior to discharge   Kerby Nora, PharmD, BCPS Heart Failure Stewardship Pharmacist Phone [phone]  Please check [email or web address] for unit-specific pharmacist phone numbers

## 2021-11-14 NOTE — TOC Progression Note (Signed)
Transition of Care [facility]) - Progression Note    Patient Details  Name: Richard [name] MRN: [id number] Date of Birth: 12-18-1970  [facility] CM/SW Contact  Zenon Mayo, RN Phone Number: 11/14/2021, 4:24 PM  Clinical Narrative:    HF, new afib RVR, had cardioversion yesterday,now back in afib, EP consulted, lasix changed to po today. TOC will continue to follow for dc needs.    Expected Discharge Plan: Home/Self Care Barriers to Discharge: Continued Medical Work up  Expected Discharge Plan and Services Expected Discharge Plan: Home/Self Care   Discharge Planning Services: CM Consult, Medication Assistance   Living arrangements for the past 2 months: Apartment                                       Social Determinants of Health (SDOH) Interventions Food Insecurity Interventions: Intervention Not Indicated Financial Strain Interventions: Intervention Not Indicated Housing Interventions: Intervention Not Indicated Transportation Interventions: Intervention Not Indicated  Readmission Risk Interventions     No data to display

## 2021-11-14 NOTE — Consult Note (Addendum)
ELECTROPHYSIOLOGY CONSULT NOTE    Patient ID: Richard [name] MRN: [id number], DOB/AGE: 52-Mar-1972 52 y.o.  Admit date: 11/09/2021 Date of Consult: 11/14/2021  Primary Physician: Maximiano Coss, NP Primary Cardiologist: Werner Lean, MD  Electrophysiologist: Dr. Curt Bears  Referring Provider: Dr. Stanford Breed  Patient Profile: Richard [name] is a 51 y.o. male with a history of NICM (previous improved, but now again reduced), prior CVA, h/o non-conpliance, DM2, CKD III who is being seen today for the evaluation of new AF with RVR and likely tachy-mediated cardiomyopathy at the request of Dr. Stanford Breed.  HPI:  Richard [name] is a 51 y.o. male with medical history as above.   Previously seen by EP team for cryptogenic stroke, for which he refused loop monitoring.   Distantly followed by HF clinic and EF had improved to 50-55% by Echo 03/2016.    Echo 05/2019 showed LVEF 20-25% when pt had presented for stroke.  Has intermittently followed with Dr. Gasper Sells for management of his CHF.   Pt presented 07/07/3005 with A/C systolic CHF and new diagnosis of CHF. He was diuresed and underwent TEE/DCC 11/13/2021, but then unfortunately had early return of AF. Digoxin was added for rate control. Felt to be poor candidate for tikosyn given baseline renal function ~ 1.8-2.0 and AKI on admission up to 2.52. With desire to avoid long-term amiodarone given young age, EP consulted to consider ablation.   Today, he is feeling better. Anxious about discussions of procedures and "wires" in his heart discussed this am. Currently denies CP or dyspnea at rest.    Past Medical History:  Diagnosis Date   Acute decompensated heart failure ([place]) 01/03/2013   Allergy    CHF (congestive heart failure) ([facility])    systolic   Chronic kidney disease    CKD (chronic kidney disease) stage 2, GFR 60-89 ml/min 01/04/2013   DM (diabetes mellitus) ([place]) 01/06/2013   Hypertension    hx htn emergency   Obesity    Stroke  [facility])      Surgical History:  Past Surgical History:  Procedure Laterality Date   BUBBLE STUDY  05/22/2019   Procedure: BUBBLE STUDY;  Surgeon: Skeet Latch, MD;  Location: [place];  Service: Cardiovascular;;   LEFT AND RIGHT HEART CATHETERIZATION WITH CORONARY ANGIOGRAM N/A 01/07/2013   Procedure: LEFT AND RIGHT HEART CATHETERIZATION WITH CORONARY ANGIOGRAM;  Surgeon: Birdie Riddle, MD;  Location: [place] CATH LAB;  Service: Cardiovascular;  Laterality: N/A;   TEE WITHOUT CARDIOVERSION N/A 05/22/2019   Procedure: TRANSESOPHAGEAL ECHOCARDIOGRAM (TEE);  Surgeon: Skeet Latch, MD;  Location: [place];  Service: Cardiovascular;  Laterality: N/A;     Medications Prior to Admission  Medication Sig Dispense Refill Last Dose   acetaminophen (TYLENOL) 500 MG tablet Take 1,000 mg by mouth every 6 (six) hours as needed for moderate pain or headache.   Past Week   amLODipine (NORVASC) 2.5 MG tablet Take 1 tablet (2.5 mg total) by mouth daily. 30 tablet 11 11/08/2021   aspirin EC 81 MG EC tablet Take 1 tablet (81 mg total) by mouth daily. 30 tablet 2 11/08/2021   carvedilol (COREG) 25 MG tablet TAKE 1 TABLET (25 MG TOTAL) BY MOUTH 2 (TWO) TIMES DAILY WITH A MEAL. 180 tablet 2 11/08/2021 at 1630   furosemide (LASIX) 40 MG tablet Take 1 tablet (40 mg total) by mouth daily. Pt. Needs to make an appt. With Cardiologist in order to receive future refills. Thank You. 1st Attempt. 30 tablet 0 11/08/2021   Multiple Vitamins-Minerals (  MULTIVITAMIN GUMMIES ADULTS PO) Take 2 tablets by mouth daily.   11/08/2021   Omega-3 Fatty Acids (FISH OIL) 1000 MG CAPS Take 1,000 mg by mouth daily.   11/08/2021   potassium chloride SA (KLOR-CON M) 20 MEQ tablet Take 1 tablet (20 mEq total) by mouth 2 (two) times daily. 180 tablet 1 11/08/2021   sacubitril-valsartan (ENTRESTO) 24-26 MG Take 1 tablet by mouth 2 (two) times daily. 180 tablet 3 11/08/2021   spironolactone (ALDACTONE) 25 MG tablet Take 0.5 tablets (12.5 mg total) by  mouth daily. Pt. Needs to make an appt. With Cardiologist in order to receive future refills. Thank You. 1st Attempt. 15 tablet 1 11/08/2021   atorvastatin (LIPITOR) 40 MG tablet Take 1 tablet (40 mg total) by mouth daily. (Patient not taking: Reported on 11/09/2021) 90 tablet 0 Not Taking    Inpatient Medications:   apixaban  5 mg Oral BID   carvedilol  12.5 mg Oral BID WC   dapagliflozin propanediol  10 mg Oral Daily   digoxin  0.125 mg Oral Daily   furosemide  60 mg Oral Daily   sacubitril-valsartan  1 tablet Oral BID   sodium chloride flush  3 mL Intravenous Q12H   spironolactone  25 mg Oral Daily    Allergies: No Known Allergies  Social History   Socioeconomic History   Marital status: Single    Spouse name: Not on file   Number of children: 0   Years of education: Not on file   Highest education level: Bachelor's degree (e.g., BA, AB, BS)  Occupational History   Occupation: Production designer, theatre/television/film  Tobacco Use   Smoking status: Never   Smokeless tobacco: Never  Vaping Use   Vaping Use: Never used  Substance and Sexual Activity   Alcohol use: No   Drug use: No   Sexual activity: Not on file  Other Topics Concern   Not on file  Social History Narrative   Work or School: Engineer, agricultural, Tax inspector      Home Situation: lives alone      Spiritual Beliefs: Christian      Lifestyle: 15 minutes dialy walking - working up; working on diet            Social Determinants of Radio broadcast assistant Strain: Low Risk  (11/13/2021)   Overall Financial Resource Strain (CARDIA)    Difficulty of Paying Living Expenses: Not very hard  Food Insecurity: No Food Insecurity (11/13/2021)   Hunger Vital Sign    Worried About Running Out of Food in the Last Year: Never true    [place] in the Last Year: Never true  Transportation Needs: No Transportation Needs (11/13/2021)   PRAPARE - Hydrologist (Medical): No    Lack of  Transportation (Non-Medical): No  Physical Activity: Sufficiently Active (06/23/2019)   Exercise Vital Sign    Days of Exercise per Week: 5 days    Minutes of Exercise per Session: 30 min  Stress: Stress Concern Present (06/23/2019)   [place]    Feeling of Stress : Rather much  Social Connections: Moderately Isolated (06/23/2019)   Social Connection and Isolation Panel [NHANES]    Frequency of Communication with Friends and Family: Three times a week    Frequency of Social Gatherings with Friends and Family: Three times a week    Attends Religious Services: 1 to 4 times  per year    Active Member of Clubs or Organizations: No    Attends Archivist Meetings: Never    Marital Status: Never married  Intimate Partner Violence: Not At Risk (06/23/2019)   Humiliation, Afraid, Rape, and Kick questionnaire    Fear of Current or Ex-Partner: No    Emotionally Abused: No    Physically Abused: No    Sexually Abused: No     Family History  Problem Relation Age of Onset   Heart disease Father 13       MI   Hypertension Father    Hyperlipidemia Father    Diabetes Mother    Hypertension Mother      Review of Systems: All other systems reviewed and are otherwise negative except as noted above.  Physical Exam: Vitals:   11/13/21 2047 11/14/21 0100 11/14/21 0530 11/14/21 0821  BP: 129/83 (!) 137/109 (!) 139/116 (!) 130/117  Pulse: 88 63 72 83  Resp: 18 18 18 18   Temp: 98.3 F (36.8 C) 98 F (36.7 C) 98.5 F (36.9 C) 97.7 F (36.5 C)  TempSrc: Oral Oral Oral   SpO2: 99% 97% 98% 99%  Weight:   (!) 141.3 kg   Height:        GEN- The patient is well appearing, alert and oriented x 3 today.   HEENT: normocephalic, atraumatic; sclera clear, conjunctiva pink; hearing intact; oropharynx clear; neck supple Lungs- Clear to ausculation bilaterally, normal work of breathing.  No wheezes, rales, rhonchi Heart-  Irregularly irregular and somewhat tachy rate and rhythm, no murmurs, rubs or gallops GI- soft, non-tender, non-distended, bowel sounds present Extremities- no clubbing, cyanosis, or edema; DP/PT/radial pulses 2+ bilaterally MS- no significant deformity or atrophy Skin- warm and dry, no rash or lesion Psych- euthymic mood, full affect Neuro- strength and sensation are intact  Labs:   Lab Results  Component Value Date   WBC 7.4 11/13/2021   HGB 15.3 11/13/2021   HCT 49.3 11/13/2021   MCV 91.5 11/13/2021   PLT 224 11/13/2021    Recent Labs  Lab 11/09/21 1520 11/10/21 0845 11/14/21 0310  NA 141   < > 136  K 3.7   < > 4.5  CL 107   < > 103  CO2 24   < > 22  BUN 31*   < > 24*  CREATININE 2.51*   < > 1.69*  CALCIUM 8.4*   < > 8.4*  PROT 6.0*  --   --   BILITOT 0.7  --   --   ALKPHOS 38  --   --   ALT 53*  --   --   AST 37  --   --   GLUCOSE 201*   < > 149*   < > = values in this interval not displayed.      Radiology/Studies: ECHO TEE  Result Date: 11/13/2021    TRANSESOPHOGEAL ECHO REPORT   Patient Name:   ARAFAT COCUZZA Date of Exam: 11/13/2021 Medical Rec #:  [id number]     Height:       71.0 in Accession #:    [id number]    Weight:       310.0 lb Date of Birth:  1970/07/22     BSA:          2.540 m Patient Age:    66 years      BP:           118/104 mmHg Patient Gender: M  HR:           139 bpm. Exam Location:  Inpatient Procedure: Transesophageal Echo, Color Doppler and Cardiac Doppler Indications:     I48.91* Unspecified atrial fibrillation  History:         Patient has prior history of Echocardiogram examinations, most                  recent 11/09/2021. CHF; Risk Factors:Hypertension and Diabetes.  Sonographer:     Raquel Sarna Senior RDCS Referring Phys:  [id number] Darreld Mclean Diagnosing Phys: Skeet Latch MD PROCEDURE: After discussion of the risks and benefits of a TEE, an informed consent was obtained from the patient. The transesophogeal probe was passed  without difficulty through the esophogus of the patient. Sedation performed by different physician. The patient was monitored while under deep sedation. Anesthestetic sedation was provided intravenously by Anesthesiology: 352mg  of Propofol, 100mg  of Lidocaine. The patient's vital signs; including heart rate, blood pressure, and oxygen saturation; remained stable throughout the procedure. The patient developed no complications during the procedure. IMPRESSIONS  1. Severely reduced biventricular systolic function.  2. The aortic valve does not open with every ventricular contraction.  3. Left ventricular ejection fraction, by estimation, is <20%. The left ventricle has severely decreased function. The left ventricle demonstrates global hypokinesis.  4. Right ventricular systolic function is severely reduced. The right ventricular size is normal.  5. No left atrial/left atrial appendage thrombus was detected.  6. The mitral valve is normal in structure. Trivial mitral valve regurgitation. No evidence of mitral stenosis.  7. The aortic valve is tricuspid. Aortic valve regurgitation is not visualized. No aortic stenosis is present.  8. The inferior vena cava is normal in size with greater than 50% respiratory variability, suggesting right atrial pressure of 3 mmHg. Conclusion(s)/Recommendation(s): Normal biventricular function without evidence of hemodynamically significant valvular heart disease. FINDINGS  Left Ventricle: Left ventricular ejection fraction, by estimation, is <20%. The left ventricle has severely decreased function. The left ventricle demonstrates global hypokinesis. The left ventricular internal cavity size was normal in size. There is no  left ventricular hypertrophy. Right Ventricle: The right ventricular size is normal. No increase in right ventricular wall thickness. Right ventricular systolic function is severely reduced. Left Atrium: Left atrial size was normal in size. No left atrial/left atrial  appendage thrombus was detected. Right Atrium: Right atrial size was normal in size. Prominent Chiari network. Pericardium: There is no evidence of pericardial effusion. Mitral Valve: The mitral valve is normal in structure. Trivial mitral valve regurgitation. No evidence of mitral valve stenosis. Tricuspid Valve: The tricuspid valve is normal in structure. Tricuspid valve regurgitation is not demonstrated. No evidence of tricuspid stenosis. Aortic Valve: The aortic valve is tricuspid. Aortic valve regurgitation is not visualized. No aortic stenosis is present. Pulmonic Valve: The pulmonic valve was normal in structure. Pulmonic valve regurgitation is not visualized. No evidence of pulmonic stenosis. Aorta: The aortic root is normal in size and structure. Venous: The inferior vena cava is normal in size with greater than 50% respiratory variability, suggesting right atrial pressure of 3 mmHg. IAS/Shunts: No atrial level shunt detected by color flow Doppler. There is no evidence of a patent foramen ovale. There is no evidence of an atrial septal defect.  TRICUSPID VALVE TR Peak grad:   24.8 mmHg TR Vmax:        249.00 cm/s Skeet Latch MD Electronically signed by Skeet Latch MD Signature Date/Time: 11/13/2021/12:38:10 PM    Final  ECHOCARDIOGRAM COMPLETE  Result Date: 11/09/2021    ECHOCARDIOGRAM REPORT   Patient Name:   KAMONI GENTLES Date of Exam: 11/09/2021 Medical Rec #:  [id number]     Height:       71.0 in Accession #:    [id number]    Weight:       322.5 lb Date of Birth:  [birth year]     BSA:          2.583 m Patient Age:    1 years      BP:           147/100 mmHg Patient Gender: M             HR:           100 bpm. Exam Location:  Inpatient Procedure: 2D Echo, Cardiac Doppler, Color Doppler and Intracardiac            Opacification Agent Indications:    CHF  History:        Patient has prior history of Echocardiogram examinations, most                 recent 05/20/2019. CHF, Stroke; Risk  Factors:Hypertension and                 Diabetes.  Sonographer:    Luisa Hart RDCS Referring Phys: [id number] ALLISON L ELLIS  Sonographer Comments: Technically difficult study due to poor echo windows and patient is morbidly obese. IMPRESSIONS  1. Left ventricular ejection fraction, by estimation, is 25 to 30%. The left ventricle has severely decreased function. The left ventricle demonstrates global hypokinesis. The left ventricular internal cavity size was mildly dilated. Left ventricular diastolic function could not be evaluated.  2. Right ventricular systolic function is normal. The right ventricular size is normal. There is moderately elevated pulmonary artery systolic pressure.  3. Left atrial size was mildly dilated.  4. The mitral valve is normal in structure. Trivial mitral valve regurgitation. No evidence of mitral stenosis.  5. The aortic valve is normal in structure. Aortic valve regurgitation is not visualized. No aortic stenosis is present.  6. The inferior vena cava is dilated in size with <50% respiratory variability, suggesting right atrial pressure of 15 mmHg. FINDINGS  Left Ventricle: Left ventricular ejection fraction, by estimation, is 25 to 30%. The left ventricle has severely decreased function. The left ventricle demonstrates global hypokinesis. The left ventricular internal cavity size was mildly dilated. There is no left ventricular hypertrophy. Left ventricular diastolic function could not be evaluated due to atrial fibrillation. Left ventricular diastolic function could not be evaluated.  LV Wall Scoring: The entire apex and mid inferoseptal segment are akinetic. The anterior wall, antero-lateral wall, anterior septum, inferior wall, posterior wall, and basal inferoseptal segment are hypokinetic. Right Ventricle: The right ventricular size is normal. No increase in right ventricular wall thickness. Right ventricular systolic function is normal. There is moderately elevated pulmonary artery  systolic pressure. The tricuspid regurgitant velocity is 3.26 m/s, and with an assumed right atrial pressure of 15 mmHg, the estimated right ventricular systolic pressure is 14.9 mmHg. Left Atrium: Left atrial size was mildly dilated. Right Atrium: Right atrial size was normal in size. Pericardium: There is no evidence of pericardial effusion. Mitral Valve: The mitral valve is normal in structure. Trivial mitral valve regurgitation. No evidence of mitral valve stenosis. Tricuspid Valve: The tricuspid valve is normal in structure. Tricuspid valve regurgitation is mild . No evidence of tricuspid stenosis. Aortic Valve: The aortic  valve is normal in structure. Aortic valve regurgitation is not visualized. No aortic stenosis is present. Aortic valve mean gradient measures 2.0 mmHg. Aortic valve peak gradient measures 4.0 mmHg. Aortic valve area, by VTI measures 1.21 cm. Pulmonic Valve: The pulmonic valve was normal in structure. Pulmonic valve regurgitation is trivial. No evidence of pulmonic stenosis. Aorta: The aortic root is normal in size and structure. Venous: The inferior vena cava is dilated in size with less than 50% respiratory variability, suggesting right atrial pressure of 15 mmHg. IAS/Shunts: No atrial level shunt detected by color flow Doppler.  LEFT VENTRICLE PLAX 2D LVIDd:         6.00 cm LVIDs:         5.10 cm LV PW:         1.10 cm LV IVS:        1.00 cm LVOT diam:     2.00 cm LV SV:         19 LV SV Index:   7 LVOT Area:     3.14 cm  RIGHT VENTRICLE RV Basal diam:  3.70 cm RV Mid diam:    2.70 cm TAPSE (M-mode): 1.7 cm LEFT ATRIUM             Index        RIGHT ATRIUM           Index LA diam:        4.40 cm 1.70 cm/m   RA Area:     21.40 cm LA Vol (A2C):   81.1 ml 31.39 ml/m  RA Volume:   64.10 ml  24.81 ml/m LA Vol (A4C):   52.1 ml 20.17 ml/m LA Biplane Vol: 65.3 ml 25.28 ml/m  AORTIC VALVE                    PULMONIC VALVE AV Area (Vmax):    1.52 cm     PV Vmax:          0.63 m/s AV Area  (Vmean):   1.53 cm     PV Vmean:         41.900 cm/s AV Area (VTI):     1.21 cm     PV VTI:           0.106 m AV Vmax:           100.00 cm/s  PV Peak grad:     1.6 mmHg AV Vmean:          65.800 cm/s  PV Mean grad:     1.0 mmHg AV VTI:            0.159 m      PR End Diast Vel: 3.21 msec AV Peak Grad:      4.0 mmHg AV Mean Grad:      2.0 mmHg LVOT Vmax:         48.30 cm/s LVOT Vmean:        31.960 cm/s LVOT VTI:          0.061 m LVOT/AV VTI ratio: 0.38  AORTA Ao Root diam: 3.20 cm Ao Asc diam:  3.10 cm MITRAL VALVE               TRICUSPID VALVE MV Area (PHT): 5.37 cm    TR Peak grad:   42.5 mmHg MV E velocity: 88.10 cm/s  TR Vmax:        326.00 cm/s  SHUNTS                            Systemic VTI:  0.06 m                            Systemic Diam: 2.00 cm Skeet Latch MD Electronically signed by Skeet Latch MD Signature Date/Time: 11/09/2021/5:39:38 PM    Final    DG Chest 2 View  Result Date: 11/09/2021 CLINICAL DATA:  Dyspnea EXAM: CHEST - 2 VIEW COMPARISON:  05/20/2019 FINDINGS: Cardiac shadow is enlarged. Mild vascular congestion is noted without significant interstitial edema. No focal infiltrate or effusion is noted. No bony abnormality is seen. IMPRESSION: Vascular congestion without edema. Electronically Signed   By: Inez Catalina M.D.   On: 11/09/2021 01:36    EKG: on admission showed AF RVR at just over 100 (personally reviewed)  TELEMETRY: AF with RVR up to 120-140s briefly (personally reviewed)  Assessment/Plan: 1.  Persistent atrial fibrillation with RVR Had TEE/DCC yesterday but unfortunately went back into AF with poor rate control Continue coreg Digoxin added. Follow closely with renal function and AKI. Will need level next week.  Poor candidate for tikosyn given AKI and non-compliance Pt is agreeable to using short term amiodarone to control his arrhythmias and give his EF the best opportunity to recover.  He would potentially be a candidate for  ablation in the future, but much more likely to be successful once EF has had chance to recover.  Will load with IV amiodarone.  If pt adamant to go home could potentially discharge on po and plan repeat [facility] as outpatient, but given his presentation would consider repeat [facility] later in week on amiodarone.   2. Acute on chronic systolic CHF TTE 01/09/4165 LVEF 25-30% , EF <20% by TEE.  NICM based on prior work up, potential tachy mediated component, but had been down in the past prior to AF diagnosis.  GDMT as tolerated  3. AKI on CKD III Improved further today.   4. HTN Adjust meds in setting of adjusted GDMT.   Dr. Quentin Ore to see.   For questions or updates, please contact [facility] Please consult [email or web address] for contact info under Cardiology/STEMI.  Jacalyn Lefevre, PA-C  11/14/2021 9:54 AM

## 2021-11-15 ENCOUNTER — Ambulatory Visit: Payer: Self-pay | Admitting: Physician Assistant

## 2021-11-15 ENCOUNTER — Encounter (HOSPITAL_COMMUNITY): Payer: Self-pay | Admitting: Cardiovascular Disease

## 2021-11-15 ENCOUNTER — Other Ambulatory Visit: Payer: Self-pay | Admitting: Physician Assistant

## 2021-11-15 DIAGNOSIS — I5022 Chronic systolic (congestive) heart failure: Secondary | ICD-10-CM

## 2021-11-15 DIAGNOSIS — I428 Other cardiomyopathies: Secondary | ICD-10-CM

## 2021-11-15 DIAGNOSIS — I4891 Unspecified atrial fibrillation: Secondary | ICD-10-CM | POA: Diagnosis not present

## 2021-11-15 DIAGNOSIS — I5021 Acute systolic (congestive) heart failure: Secondary | ICD-10-CM | POA: Diagnosis not present

## 2021-11-15 DIAGNOSIS — R7303 Prediabetes: Secondary | ICD-10-CM

## 2021-11-15 DIAGNOSIS — N1832 Chronic kidney disease, stage 3b: Secondary | ICD-10-CM

## 2021-11-15 DIAGNOSIS — I10 Essential (primary) hypertension: Secondary | ICD-10-CM

## 2021-11-15 DIAGNOSIS — I5023 Acute on chronic systolic (congestive) heart failure: Secondary | ICD-10-CM

## 2021-11-15 DIAGNOSIS — I4819 Other persistent atrial fibrillation: Secondary | ICD-10-CM

## 2021-11-15 LAB — BASIC METABOLIC PANEL
Anion gap: 13 (ref 5–15)
BUN: 26 mg/dL — ABNORMAL HIGH (ref 6–20)
CO2: 21 mmol/L — ABNORMAL LOW (ref 22–32)
Calcium: 8.5 mg/dL — ABNORMAL LOW (ref 8.9–10.3)
Chloride: 103 mmol/L (ref 98–111)
Creatinine, Ser: 2.06 mg/dL — ABNORMAL HIGH (ref 0.61–1.24)
GFR, Estimated: 38 mL/min — ABNORMAL LOW (ref >60)
Glucose, Bld: 148 mg/dL — ABNORMAL HIGH (ref 70–99)
Potassium: 3.9 mmol/L (ref 3.5–5.1)
Sodium: 137 mmol/L (ref 135–145)

## 2021-11-15 MED ORDER — SPIRONOLACTONE 25 MG PO TABS: 25.0000 mg | ORAL_TABLET | Freq: Every day | ORAL | 1 refills | Status: DC

## 2021-11-15 MED ORDER — CARVEDILOL 12.5 MG PO TABS: 12.5000 mg | ORAL_TABLET | Freq: Two times a day (BID) | ORAL | 1 refills | Status: DC

## 2021-11-15 MED ORDER — AMIODARONE HCL 200 MG PO TABS
400.0000 mg | ORAL_TABLET | Freq: Two times a day (BID) | ORAL | Status: DC
  Administered 2021-11-15: 400 mg via ORAL
  Filled 2021-11-15: qty 2

## 2021-11-15 MED ORDER — AMIODARONE HCL 400 MG PO TABS: 400.0000 mg | ORAL_TABLET | Freq: Two times a day (BID) | ORAL | 1 refills | Status: DC

## 2021-11-15 MED ORDER — ATORVASTATIN CALCIUM 40 MG PO TABS: 40.0000 mg | ORAL_TABLET | Freq: Every day | ORAL | 0 refills | Status: DC

## 2021-11-15 MED ORDER — FUROSEMIDE 20 MG PO TABS: 60.0000 mg | ORAL_TABLET | Freq: Every day | ORAL | 1 refills | Status: DC

## 2021-11-15 MED ORDER — SACUBITRIL-VALSARTAN 24-26 MG PO TABS: 1.0000 | ORAL_TABLET | Freq: Two times a day (BID) | ORAL | 1 refills | Status: DC

## 2021-11-15 MED ORDER — DAPAGLIFLOZIN PROPANEDIOL 10 MG PO TABS: 10.0000 mg | ORAL_TABLET | Freq: Every day | ORAL | 1 refills | Status: DC

## 2021-11-15 MED ORDER — APIXABAN 5 MG PO TABS: 5.0000 mg | ORAL_TABLET | Freq: Two times a day (BID) | ORAL | 1 refills | Status: DC

## 2021-11-15 NOTE — Progress Notes (Signed)
Dr. Stanford Breed requested BMET Fri with results to Dr. Gasper Sells

## 2021-11-15 NOTE — Progress Notes (Addendum)
Heart Failure Stewardship Pharmacist Progress Note   PCP: Maximiano Coss, NP PCP-Cardiologist: Werner Lean, MD    HPI:  51 y.o. male who presented after 3 weeks of lower extremity edema that has migrated to the abdomen.  This was associated with dyspnea on exertion.  He stated he has not been watching his fluid intake as carefully as he should've been recently. Prior to admission patient with known history of heart failure  (EF 20-25% in 05/2019) and was on Lasix, Entresto and spironolactone.  In the past patient has been unable to follow-up with primary cardiologist due to insurance concerns. He states Delene Loll was very expensive but he has been taking this as ordered. He has a few days left.   In the ER patient was found to have acute kidney injury, clinical signs consistent with acute heart failure noted 2 view chest x-ray revealed vascular congestion without edema.  BNP was elevated greater than 900 and troponin elevated which is consistent with likely demand ischemia.   Patient was found to have new onset atrial fib with RVR. Cardiology was consulted.  Rate control EDP had ordered IV amiodarone but this was discontinued by the cardiology physician.  Home carvedilol was continued but at a lower dose.    Patient had a prior amyloid work-up which was negative and a cath in 2014 that was negative for CAD. 11/09/2021 echo with EF 25-30%, RV normal.   ECHO 11/09/21 with LVEF 25-30%, RV normal, moderately elevated PA pressures.  S/p TEE/DCCV 6/12, and once restoration of NSR, will recheck ECHO in 3 months. However, patient flipped back to afib later that afternoon. Plan was to load with IV amio and repeat DCCV but patient wishes to discharge today and follow up as outpatient.   Current HF Medications: Diuretic: furosemide 60 mg PO daily Beta Blocker: carvedilol 12.5 mg BID ACE/ARB/ARNI: Entresto 24/26 mg BID Aldosterone Antagonist: spironolactone 25 mg daily SGLT2i: Farxiga 10 mg  daily  Prior to admission HF Medications: Diuretic: furosemide 40 mg daily Beta blocker: carvedilol 25 mg BID ACE/ARB/ARNI: Entresto 24/26 mg BID Aldosterone Antagonist: Spironolactone 12.5 mg daily SGLT2i: none Other: Kcl 40 mEq BID Dispense report indicates recent fills of all medications above except Entresto.   Pertinent Lab Values: Serum creatinine 2.06, BUN 26, Potassium 3.9, Sodium 137, BNP 962.5, Magnesium 1.9, A1c 6.4% TSAT 11, Ferritin 84  Vital Signs: Weight: 314 lbs (admission weight: 322 lbs) Blood pressure: 120/100s Heart rate: 90-110s (afib) I/O: -2L yesterday, net -11.1L  Medication Assistance / Insurance Benefits Check: Does the patient have prescription insurance?  Yes Type of insurance plan: Nurse, learning disability Has deductible left: $3,000 total - has paid $270 per [facility]  Outpatient Pharmacy:  Prior to admission outpatient pharmacy[facility] Is the patient willing to use [facility] at discharge? Yes Is the patient willing to transition their outpatient pharmacy to utilize a [facility] outpatient pharmacy?   Pending   Assessment: 1. Acute on chronic systolic CHF (LVEF 54-09%), due to presumed NICM. S/p unsuccessful TEE/DCCV. Plan was for repeat ECHO 3 months after NSR restored. NYHA class II symptoms. - Volume improved, euvolemic on exam, weight up today though. Transitioned from IV lasix. SCr bumped today. Continue furosemide 60 mg PO daily. - Continue carvedilol 12.5 mg BID for rate control of AF. TEE/DCCV unsuccessful. Consider increasing up to 25 mg BID.  -Continue Entresto 24/26 mg BID and spironolactone 25 mg daily. Watch renal function. -A1c 6.4%. New diagnosis of DM. Continue Farxiga 10  mg daily. -Digoxin 0.125 mg daily started yesterday but stopped with bump in SCr today as well as addition of amiodarone.  -Lactic acid 1.9, persistent low EF, may need RHC before discharge to assess hemodynamics -Keep K >4, Mg >2 -Consider Lifevest with  persistently low EF but defer to cardiology with repeat ECHO planned in 3 months   Plan: 1) Medication changes recommended at this time: - Consider increasing to coreg 25 mg BID - patient wishes to discharge today  2) Patient assistance: -Has $2730 left on deductible  -Eliquis - copay card will cover $6400 per year. Patient already provided with copay card. Copay $10 per month. Delene Loll - copay card will cover $3250 per year. Patient already provided with copay card. Copay $10 per fill (30-90 day supply) -Farxiga- PA approved - copay card will cover $175 per 30-day supply. Patient already provided with copay card. Copay $0 per month.  3)  Education  - Patient has been educated on current HF medications and potential additions to HF medication regimen - Patient verbalizes understanding that over the next few months, these medication doses may change and more medications may be added to optimize HF regimen - Patient has been educated on basic disease state pathophysiology and goals of therapy   Kerby Nora, PharmD, BCPS Heart Failure Stewardship Pharmacist Phone [phone]  Please check [email or web address] for unit-specific pharmacist phone numbers

## 2021-11-15 NOTE — TOC Transition Note (Addendum)
Transition of Care [facility]) - CM/SW Discharge Note   Patient Details  Name: Richard [name] MRN: [id number] Date of Birth: 10-09-1970  Transition of Care [facility]) CM/SW Contact:  Zenon Mayo, RN Phone Number: 11/15/2021, 2:44 PM   Clinical Narrative:     Patient is for dc today, he states he wlll calling an uber to pick him up and he will be paying for this.  A hospital follow up apt with PCP office has been made and is on the AVS. He states he has a bp cuff and checks his bp twice a day. He has a scale but does not weigh himself daily, but will try to start to do this.   He also eats a low sodium diet.       Barriers to Discharge: Continued Medical Work up   Patient Goals and CMS Choice Patient states their goals for this hospitalization and ongoing recovery are:: wants to be able to return to work      Discharge Placement                       Discharge Plan and Services   Discharge Planning Services: CM Consult, Medication Assistance                                 Social Determinants of Health (SDOH) Interventions Food Insecurity Interventions: Intervention Not Indicated Financial Strain Interventions: Intervention Not Indicated Housing Interventions: Intervention Not Indicated Transportation Interventions: Intervention Not Indicated   Readmission Risk Interventions     No data to display

## 2021-11-15 NOTE — Progress Notes (Addendum)
Per Dr. Jacalyn Lefevre request, lab follow-up arranged 11/17/21 for BMET, EP f/u 6/20 as scheduled (clarified with him this is instead of afib clinic), and general cardiology f/u in 4 weeks (7/13) with APP since Dr. Gasper Sells is not available during that timeframe. Patient also plugged into HF TOC program per chart review on 6/23. Appt info outlined on AVS.

## 2021-11-15 NOTE — Discharge Summary (Addendum)
Physician Discharge Summary   Patient: Richard [name] MRN: [id number] DOB: Aug 25, 1970  Admit date:     11/09/2021  Discharge date: 11/15/21  Discharge Physician: Erin Hearing   PCP: Maximiano Coss, NP   Recommendations at discharge:   Patient is scheduled to have follow-up electrolyte panel obtained at the cardiology office on 6/16 He will also follow-up with the electrophysiology cardiologist on 6/20 He will follow-up with general cardiology on 7/13 He has been set up to have the heart failure transitions of care team follow-up with him beginning 6/20 Work note will be provided for patient with return to work November 20, 2021 Recommend obtain TSH while on amiodarone in the outpatient setting  Discharge Diagnoses: Principal Problem:   Acute systolic CHF (congestive heart failure) ([place]) Active Problems:   Disorder associated with well-controlled type 2 diabetes mellitus ([place])   Obesity   Chronic combined systolic and diastolic CHF, NYHA class 2 ([place])   Stage 3b chronic kidney disease ([place])   History of CVA (cerebrovascular accident)   HTN (hypertension)   New onset atrial fibrillation ([place])   AKI (acute kidney injury) ([place])   Prediabetes   Hospital Course: 51 y.o. male with a hx of  NICM and HFrEF (EF 20-25%), CVA, type 2 diabetes mellitus, CKD 3a, obesity presented to the ED with worsening shortness of breath, orthopnea and abdominal swelling, BNP was 962, troponin 466, chest x-ray with pulmonary vascular congestion, also noted to be in new onset A-fib with RVR.  Patient treated with aggressive IV antibiotics as well as Aldactone during this hospitalization with resolution of acute heart failure symptomatology.  He has lost 10.1 L since admission.  Assessment and Plan: Acute combined systolic and diastolic heart failure -EF was previously 20-25%, subsequently improved and then reduced again in 2017, cath in 2014 was negative for obstruction, -Inconsistent medication use, in the  setting of losing insurance in between -Repeat echo with EF 25-30%, RV function is normal, moderate PAH -Cardiology following, he is 10.1 L negative, weight down 12 pounds -Remains on Lasix IV daily, also started on Aldactone, Farxiga and Entresto this admission, continue carvedilol -Of note patient was also provided with a 30-day free trial and and co-pay card by case manager   New onset atrial fib with RVR -New onset, heart rate suboptimally controlled, continue carvedilol -Started on Eliquis this admission.  Co-pay very expensive so TOC team has arranged for 30-day free trial card and co-pay card. -Patient underwent DCCV on 6/12 and converted at 841 J after 1 application fortunately A-fib has recurred with intermittent episodes of ventricular rates into the 120s with no symptoms -Patient started on amiodarone this admission and will continue after discharge.  Recommendation is to give amiodarone 400 mg twice daily for 1 week then 200 mg daily -Anticipate follow-up cardioversion in 2 to 4 weeks..  After this patient will need to follow-up with Dr. Quentin Ore for consideration of atrial fibrillation ablation. -Recommend follow-up TSH while on amiodarone.  TSH at presentation 1.293   Acute kidney injury on stage IIIb chronic kidney disease Baseline creatinine 1.62, was 2.5 on admission -Suspect cardiorenal physiology, now 1.9 -Avoid hypotension and nephrotoxic medication -Creatinine on date of discharge 2.06 with a BUN of 26, sodium 137 and a GFR of 38 which is stable   Hypertension -Improving, continue Coreg and diuretics   History of CVA Aspirin on hold since started on Eliquis   Prediabetes Hemoglobin A1c in 2017 was 8.7.  Now 6.4,, started on SGLT2i this admission.  CM also provided patient with [facility] Glade free trial card and $0 co-pay card   Obesity Counseling for weight reduction recommended in the outpatient Body mass index is 41.51 kg/m.        Consultants: General cardiology,  electrophysiology cardiology Procedures performed: DCCV on 6/12 Disposition: Home Diet recommendation:  Cardiac and Carb modified diet DISCHARGE MEDICATION: Allergies as of 11/15/2021   No Known Allergies      Medication List     STOP taking these medications    amLODipine 2.5 MG tablet Commonly known as: NORVASC   aspirin EC 81 MG tablet       TAKE these medications    acetaminophen 500 MG tablet Commonly known as: TYLENOL Take 1,000 mg by mouth every 6 (six) hours as needed for moderate pain or headache.   amiodarone 400 MG tablet Commonly known as: PACERONE Take 1 tablet (400 mg total) by mouth 2 (two) times daily. Take 400 mg twice daily for 1 week then cut tablet in half and take 200 mg daily until follow-up with cardiology   apixaban 5 MG Tabs tablet Commonly known as: ELIQUIS Take 1 tablet (5 mg total) by mouth 2 (two) times daily.   atorvastatin 40 MG tablet Commonly known as: LIPITOR Take 1 tablet (40 mg total) by mouth daily.   carvedilol 12.5 MG tablet Commonly known as: COREG Take 1 tablet (12.5 mg total) by mouth 2 (two) times daily with a meal. What changed:  medication strength how much to take   dapagliflozin propanediol 10 MG Tabs tablet Commonly known as: FARXIGA Take 1 tablet (10 mg total) by mouth daily. Start taking on: November 16, 2021   Fish Oil 1000 MG Caps Take 1,000 mg by mouth daily.   furosemide 20 MG tablet Commonly known as: LASIX Take 3 tablets (60 mg total) by mouth daily. Start taking on: November 16, 2021 What changed:  medication strength how much to take additional instructions   MULTIVITAMIN GUMMIES ADULTS PO Take 2 tablets by mouth daily.   potassium chloride SA 20 MEQ tablet Commonly known as: KLOR-CON M Take 1 tablet (20 mEq total) by mouth 2 (two) times daily.   sacubitril-valsartan 24-26 MG Commonly known as: ENTRESTO Take 1 tablet by mouth 2 (two) times daily.   spironolactone 25 MG tablet Commonly known  as: ALDACTONE Take 1 tablet (25 mg total) by mouth daily. Start taking on: November 16, 2021 What changed:  how much to take additional instructions        Follow-up Information     [place]. Go in 10 day(s).   Specialty: Cardiology Why: Hospital follow up on Friday Nov 24, 2021 at 10:00 AM in the [facility] bring current medication list to appointment FREE valet parking, Entrance C, off of [name] information: [address] [id number] [place] [phone]        [place] [facility] Follow up.   Specialty: Cardiology Why: [place] location - please come to the lab on Friday 11/17/21. Your paperwork says 10am but you can come any time between 8am-4:30pm. You do not need to be fasting. Contact information: [address], [address] [place]        Shirley Friar, PA-C Follow up.   Specialty: Physician Assistant Why: [place] location - electrophysiology follow-up appointment scheduled for  Tuesday Nov 21, 2021 at 8:20 AM (Arrive by 8:05 AM). Contact information: [address] [place] [address] [phone]         Leanor Kail, [place] Follow up.   Specialty: Cardiology Why: [place] location - a general cardiology follow-up has been arranged for Thursday Dec 14, 2021 11:20 AM (Arrive by 11:05 AM). Vin is one of the PAs that works with Dr. Gasper Sells. Contact information: [address] [place] [address] [phone]                Discharge Exam: [name] Weights   11/13/21 0908 11/14/21 0530 11/15/21 0109  Weight: (!) 140.6 kg (!) 141.3 kg (!) 142.8 kg   General exam: Obese pleasant male sitting up in the recliner, AAOx3, no distress HEENT: Positive for  minimal JVD CVS: S1-S2, irregularly irregular rhythm Lungs: Posterior lung sounds are essentially clear to auscultation and somewhat decreased in the bases, room air Abdomen: Soft, obese, less distended, abdominal wall edema resolved Extremities: Trace edema Skin: No rashes Psychiatry:  Mood & affect appropriate.   Condition at discharge: stable  The results of significant diagnostics from this hospitalization (including imaging, microbiology, ancillary and laboratory) are listed below for reference.   Imaging Studies: ECHO TEE  Result Date: 11/13/2021    TRANSESOPHOGEAL ECHO REPORT   Patient Name:   Richard [name] Date of Exam: 11/13/2021 Medical Rec #:  [id number]     Height:       71.0 in Accession #:    [id number]    Weight:       310.0 lb Date of Birth:  1970/09/23     BSA:          2.540 m Patient Age:    41 years      BP:           118/104 mmHg Patient Gender: M             HR:           139 bpm. Exam Location:  Inpatient Procedure: Transesophageal Echo, Color Doppler and Cardiac Doppler Indications:     I48.91* Unspecified atrial fibrillation  History:         Patient has prior history of Echocardiogram examinations, most                  recent 11/09/2021. CHF; Risk Factors:Hypertension and Diabetes.  Sonographer:     Raquel Sarna Senior RDCS Referring Phys:  [id number] Darreld Mclean Diagnosing Phys: Skeet Latch MD PROCEDURE: After discussion of the risks and benefits of a TEE, an informed consent was obtained from the patient. The transesophogeal probe was passed without difficulty through the esophogus of the patient. Sedation performed by different physician. The patient was monitored while under deep sedation. Anesthestetic sedation was provided intravenously by Anesthesiology: 352mg  of Propofol, 100mg  of Lidocaine. The patient's vital signs; including heart rate, blood pressure, and oxygen saturation; remained stable throughout the procedure. The patient developed no complications during the  procedure. IMPRESSIONS  1. Severely reduced biventricular systolic function.  2. The aortic valve does not open with every ventricular contraction.  3. Left ventricular ejection fraction, by estimation, is <20%. The left ventricle has severely decreased function. The left ventricle demonstrates global hypokinesis.  4. Right ventricular systolic function is severely reduced. The right ventricular size is normal.  5. No left atrial/left atrial appendage thrombus was detected.  6. The mitral valve is normal in structure.  Trivial mitral valve regurgitation. No evidence of mitral stenosis.  7. The aortic valve is tricuspid. Aortic valve regurgitation is not visualized. No aortic stenosis is present.  8. The inferior vena cava is normal in size with greater than 50% respiratory variability, suggesting right atrial pressure of 3 mmHg. Conclusion(s)/Recommendation(s): Normal biventricular function without evidence of hemodynamically significant valvular heart disease. FINDINGS  Left Ventricle: Left ventricular ejection fraction, by estimation, is <20%. The left ventricle has severely decreased function. The left ventricle demonstrates global hypokinesis. The left ventricular internal cavity size was normal in size. There is no  left ventricular hypertrophy. Right Ventricle: The right ventricular size is normal. No increase in right ventricular wall thickness. Right ventricular systolic function is severely reduced. Left Atrium: Left atrial size was normal in size. No left atrial/left atrial appendage thrombus was detected. Right Atrium: Right atrial size was normal in size. Prominent Chiari network. Pericardium: There is no evidence of pericardial effusion. Mitral Valve: The mitral valve is normal in structure. Trivial mitral valve regurgitation. No evidence of mitral valve stenosis. Tricuspid Valve: The tricuspid valve is normal in structure. Tricuspid valve regurgitation is not demonstrated. No evidence of tricuspid  stenosis. Aortic Valve: The aortic valve is tricuspid. Aortic valve regurgitation is not visualized. No aortic stenosis is present. Pulmonic Valve: The pulmonic valve was normal in structure. Pulmonic valve regurgitation is not visualized. No evidence of pulmonic stenosis. Aorta: The aortic root is normal in size and structure. Venous: The inferior vena cava is normal in size with greater than 50% respiratory variability, suggesting right atrial pressure of 3 mmHg. IAS/Shunts: No atrial level shunt detected by color flow Doppler. There is no evidence of a patent foramen ovale. There is no evidence of an atrial septal defect.  TRICUSPID VALVE TR Peak grad:   24.8 mmHg TR Vmax:        249.00 cm/s Skeet Latch MD Electronically signed by Skeet Latch MD Signature Date/Time: 11/13/2021/12:38:10 PM    Final    ECHOCARDIOGRAM COMPLETE  Result Date: 11/09/2021    ECHOCARDIOGRAM REPORT   Patient Name:   Richard [name] Date of Exam: 11/09/2021 Medical Rec #:  [id number]     Height:       71.0 in Accession #:    [id number]    Weight:       322.5 lb Date of Birth:  1970-08-14     BSA:          2.583 m Patient Age:    25 years      BP:           147/100 mmHg Patient Gender: M             HR:           100 bpm. Exam Location:  Inpatient Procedure: 2D Echo, Cardiac Doppler, Color Doppler and Intracardiac            Opacification Agent Indications:    CHF  History:        Patient has prior history of Echocardiogram examinations, most                 recent 05/20/2019. CHF, Stroke; Risk Factors:Hypertension and                 Diabetes.  Sonographer:    Luisa Hart RDCS Referring Phys: [id number] ALLISON L ELLIS  Sonographer Comments: Technically difficult study due to poor echo windows and patient is morbidly obese. IMPRESSIONS  1. Left ventricular ejection fraction,  by estimation, is 25 to 30%. The left ventricle has severely decreased function. The left ventricle demonstrates global hypokinesis. The left ventricular internal  cavity size was mildly dilated. Left ventricular diastolic function could not be evaluated.  2. Right ventricular systolic function is normal. The right ventricular size is normal. There is moderately elevated pulmonary artery systolic pressure.  3. Left atrial size was mildly dilated.  4. The mitral valve is normal in structure. Trivial mitral valve regurgitation. No evidence of mitral stenosis.  5. The aortic valve is normal in structure. Aortic valve regurgitation is not visualized. No aortic stenosis is present.  6. The inferior vena cava is dilated in size with <50% respiratory variability, suggesting right atrial pressure of 15 mmHg. FINDINGS  Left Ventricle: Left ventricular ejection fraction, by estimation, is 25 to 30%. The left ventricle has severely decreased function. The left ventricle demonstrates global hypokinesis. The left ventricular internal cavity size was mildly dilated. There is no left ventricular hypertrophy. Left ventricular diastolic function could not be evaluated due to atrial fibrillation. Left ventricular diastolic function could not be evaluated.  LV Wall Scoring: The entire apex and mid inferoseptal segment are akinetic. The anterior wall, antero-lateral wall, anterior septum, inferior wall, posterior wall, and basal inferoseptal segment are hypokinetic. Right Ventricle: The right ventricular size is normal. No increase in right ventricular wall thickness. Right ventricular systolic function is normal. There is moderately elevated pulmonary artery systolic pressure. The tricuspid regurgitant velocity is 3.26 m/s, and with an assumed right atrial pressure of 15 mmHg, the estimated right ventricular systolic pressure is 25.9 mmHg. Left Atrium: Left atrial size was mildly dilated. Right Atrium: Right atrial size was normal in size. Pericardium: There is no evidence of pericardial effusion. Mitral Valve: The mitral valve is normal in structure. Trivial mitral valve regurgitation. No  evidence of mitral valve stenosis. Tricuspid Valve: The tricuspid valve is normal in structure. Tricuspid valve regurgitation is mild . No evidence of tricuspid stenosis. Aortic Valve: The aortic valve is normal in structure. Aortic valve regurgitation is not visualized. No aortic stenosis is present. Aortic valve mean gradient measures 2.0 mmHg. Aortic valve peak gradient measures 4.0 mmHg. Aortic valve area, by VTI measures 1.21 cm. Pulmonic Valve: The pulmonic valve was normal in structure. Pulmonic valve regurgitation is trivial. No evidence of pulmonic stenosis. Aorta: The aortic root is normal in size and structure. Venous: The inferior vena cava is dilated in size with less than 50% respiratory variability, suggesting right atrial pressure of 15 mmHg. IAS/Shunts: No atrial level shunt detected by color flow Doppler.  LEFT VENTRICLE PLAX 2D LVIDd:         6.00 cm LVIDs:         5.10 cm LV PW:         1.10 cm LV IVS:        1.00 cm LVOT diam:     2.00 cm LV SV:         19 LV SV Index:   7 LVOT Area:     3.14 cm  RIGHT VENTRICLE RV Basal diam:  3.70 cm RV Mid diam:    2.70 cm TAPSE (M-mode): 1.7 cm LEFT ATRIUM             Index        RIGHT ATRIUM           Index LA diam:        4.40 cm 1.70 cm/m   RA Area:  21.40 cm LA Vol (A2C):   81.1 ml 31.39 ml/m  RA Volume:   64.10 ml  24.81 ml/m LA Vol (A4C):   52.1 ml 20.17 ml/m LA Biplane Vol: 65.3 ml 25.28 ml/m  AORTIC VALVE                    PULMONIC VALVE AV Area (Vmax):    1.52 cm     PV Vmax:          0.63 m/s AV Area (Vmean):   1.53 cm     PV Vmean:         41.900 cm/s AV Area (VTI):     1.21 cm     PV VTI:           0.106 m AV Vmax:           100.00 cm/s  PV Peak grad:     1.6 mmHg AV Vmean:          65.800 cm/s  PV Mean grad:     1.0 mmHg AV VTI:            0.159 m      PR End Diast Vel: 3.21 msec AV Peak Grad:      4.0 mmHg AV Mean Grad:      2.0 mmHg LVOT Vmax:         48.30 cm/s LVOT Vmean:        31.960 cm/s LVOT VTI:          0.061 m LVOT/AV  VTI ratio: 0.38  AORTA Ao Root diam: 3.20 cm Ao Asc diam:  3.10 cm MITRAL VALVE               TRICUSPID VALVE MV Area (PHT): 5.37 cm    TR Peak grad:   42.5 mmHg MV E velocity: 88.10 cm/s  TR Vmax:        326.00 cm/s                             SHUNTS                            Systemic VTI:  0.06 m                            Systemic Diam: 2.00 cm Skeet Latch MD Electronically signed by Skeet Latch MD Signature Date/Time: 11/09/2021/5:39:38 PM    Final    DG Chest 2 View  Result Date: 11/09/2021 CLINICAL DATA:  Dyspnea EXAM: CHEST - 2 VIEW COMPARISON:  05/20/2019 FINDINGS: Cardiac shadow is enlarged. Mild vascular congestion is noted without significant interstitial edema. No focal infiltrate or effusion is noted. No bony abnormality is seen. IMPRESSION: Vascular congestion without edema. Electronically Signed   By: Inez Catalina M.D.   On: 11/09/2021 01:36    Microbiology: Results for orders placed or performed during the hospital encounter of 05/19/19  SARS CORONAVIRUS 2 (TAT 6-24 HRS) Nasopharyngeal Nasopharyngeal Swab     Status: None   Collection Time: 05/20/19  6:48 AM   Specimen: Nasopharyngeal Swab  Result Value Ref Range Status   SARS Coronavirus 2 NEGATIVE NEGATIVE Final    Comment: (NOTE) SARS-CoV-2 target nucleic acids are NOT DETECTED. The SARS-CoV-2 RNA is generally detectable in upper and lower respiratory specimens during the acute phase of infection. Negative  results do not preclude SARS-CoV-2 infection, do not rule out co-infections with other pathogens, and should not be used as the sole basis for treatment or other patient management decisions. Negative results must be combined with clinical observations, patient history, and epidemiological information. The expected result is Negative. Fact Sheet for Patients: SugarRoll.be Fact Sheet for Healthcare Providers: https://www.woods-mathews.com/ This test is not yet approved or  cleared by the [place] FDA and  has been authorized for detection and/or diagnosis of SARS-CoV-2 by FDA under an Emergency Use Authorization (EUA). This EUA will remain  in effect (meaning this test can be used) for the duration of the COVID-19 declaration under Section 56 4(b)(1) of the Act, 21 U.S.C. section 360bbb-3(b)(1), unless the authorization is terminated or revoked sooner. Performed at [facility], [place] [address] [place], [place] [address]     Labs: CBC: Recent Labs  Lab 11/09/21 0054 11/13/21 0334  WBC 8.0 7.4  NEUTROABS 4.8  --   HGB 14.3 15.3  HCT 44.2 49.3  MCV 91.1 91.5  PLT 207 081   Basic Metabolic Panel: Recent Labs  Lab 11/09/21 0054 11/09/21 1520 11/11/21 0237 11/12/21 0354 11/13/21 0334 11/14/21 0310 11/15/21 0319  NA 141   < > 138 140 137 136 137  K 3.5   < > 3.4* 3.5 4.0 4.5 3.9  CL 106   < > 103 103 104 103 103  CO2 22   < > 25 28 23 22  21*  GLUCOSE 198*   < > 143* 158* 137* 149* 148*  BUN 29*   < > 25* 23* 23* 24* 26*  CREATININE 2.52*   < > 1.90* 1.92* 1.92* 1.69* 2.06*  CALCIUM 8.5*   < > 8.3* 8.0* 8.3* 8.4* 8.5*  MG 1.9  --   --   --   --   --   --    < > = values in this interval not displayed.   Liver Function Tests: Recent Labs  Lab 11/09/21 0054 11/09/21 1520  AST 39 37  ALT 51* 53*  ALKPHOS 45 38  BILITOT 0.7 0.7  PROT 6.1* 6.0*  ALBUMIN 3.3* 3.2*   CBG: Recent Labs  Lab 11/11/21 1109 11/11/21 1546  GLUCAP 155* 125*    Discharge time spent: greater than 30 minutes.  Signed: Erin Hearing, NP [facility] 11/15/2021    The patient was seen and examined independently, labs were reviewed, agree with discharge planning and med rec.  SIGNED: Deatra James, MD, FHM. [facility],  Pager (please use [email or web address] to page/text)  Please use Epic Secure Chat for non-urgent communication (7AM-7PM) If 7PM-7AM, please contact night-coverage [email or web address],  11/15/2021, 2:29 PM

## 2021-11-15 NOTE — Progress Notes (Signed)
Progress Note  Patient Name: Richard [name] Date of Encounter: 11/15/2021  [facility] Cardiologist: Werner Lean, MD   Subjective   Pt denies CP or dyspnea  Inpatient Medications    Scheduled Meds:  apixaban  5 mg Oral BID   carvedilol  12.5 mg Oral BID WC   dapagliflozin propanediol  10 mg Oral Daily   digoxin  0.125 mg Oral Daily   furosemide  60 mg Oral Daily   sacubitril-valsartan  1 tablet Oral BID   sodium chloride flush  3 mL Intravenous Q12H   spironolactone  25 mg Oral Daily   Continuous Infusions:  sodium chloride     amiodarone 30 mg/hr (11/15/21 0639)   PRN Meds: sodium chloride, acetaminophen, hydrALAZINE, ondansetron (ZOFRAN) IV, sodium chloride flush   Vital Signs    Vitals:   11/14/21 1709 11/14/21 2038 11/15/21 0005 11/15/21 0109  BP: (!) 128/111 (!) 119/109 (!) 118/92   Pulse: (!) 128 65 (!) 58   Resp:  18 16   Temp:  98.5 F (36.9 C) 97.7 F (36.5 C)   TempSrc:  Oral Oral   SpO2:  100%    Weight:    (!) 142.8 kg  Height:        Intake/Output Summary (Last 24 hours) at 11/15/2021 0728 Last data filed at 11/15/2021 0036 Gross per 24 hour  Intake 1320 ml  Output 1400 ml  Net -80 ml       11/15/2021    1:09 AM 11/14/2021    5:30 AM 11/13/2021    9:08 AM  Last 3 Weights  Weight (lbs) 314 lb 12.8 oz 311 lb 6.4 oz 310 lb  Weight (kg) 142.792 kg 141.25 kg 140.615 kg      Telemetry    Atrial fibrillation rate upper normal to mildly elevated- Personally Reviewed  Physical Exam   GEN: NAD WD Neck: supple Cardiac: irregular, no murmur Respiratory: CTA; no wheeze GI: Soft, NT/ND, no masses MS: No edema Neuro:  No focal findings Psych: Normal affect   Labs    High Sensitivity Troponin:   Recent Labs  Lab 11/09/21 0054 11/09/21 0256  TROPONINIHS 466* 349*      Chemistry Recent Labs  Lab 11/09/21 0054 11/09/21 1520 11/10/21 0845 11/13/21 0334 11/14/21 0310 11/15/21 0319  NA 141 141   < > 137 136 137  K  3.5 3.7   < > 4.0 4.5 3.9  CL 106 107   < > 104 103 103  CO2 22 24   < > 23 22 21*  GLUCOSE 198* 201*   < > 137* 149* 148*  BUN 29* 31*   < > 23* 24* 26*  CREATININE 2.52* 2.51*   < > 1.92* 1.69* 2.06*  CALCIUM 8.5* 8.4*   < > 8.3* 8.4* 8.5*  MG 1.9  --   --   --   --   --   PROT 6.1* 6.0*  --   --   --   --   ALBUMIN 3.3* 3.2*  --   --   --   --   AST 39 37  --   --   --   --   ALT 51* 53*  --   --   --   --   ALKPHOS 45 38  --   --   --   --   BILITOT 0.7 0.7  --   --   --   --   YFVCBSWH  30* 30*   < > 42* 49* 38*  ANIONGAP 13 10   < > 10 11 13    < > = values in this interval not displayed.      Hematology Recent Labs  Lab 11/09/21 0054 11/10/21 0845 11/13/21 0334  WBC 8.0  --  7.4  RBC 4.85 4.90 5.39  HGB 14.3  --  15.3  HCT 44.2  --  49.3  MCV 91.1  --  91.5  MCH 29.5  --  28.4  MCHC 32.4  --  31.0  RDW 14.7  --  14.6  PLT 207  --  224    Thyroid  Recent Labs  Lab 11/09/21 1520  TSH 1.293     BNP Recent Labs  Lab 11/09/21 0055  BNP 962.5*     Patient Profile     51 y.o. male with past medical history of nonischemic cardiomyopathy (previous improvement in ejection fraction but now again reduced), prior CVA, history of noncompliance, diabetes mellitus, chronic stage III kidney disease for evaluation of acute on chronic systolic congestive heart failure and newly diagnosed atrial fibrillation.  Echocardiogram this admission shows ejection fraction 25 to 30% with global hypokinesis, mild left ventricular enlargement, moderate pulmonary hypertension, mild left atrial enlargement.  Assessment & Plan    1 acute on chronic systolic congestive heart failure-I/O-80 since admission; Wt 142.8 kg.  Pt is euvolemic this AM.  Continue Lasix, spironolactone and Farxiga at present dose.  2 history of nonischemic cardiomyopathy-LV function was reduced in the past with report of no coronary disease.  His LV function improved but now is reduced again.  Possibly secondary  to uncontrolled hypertension versus tachycardia mediated related atrial fibrillation.  Continue Entresto and coreg.  Follow blood pressure and titrate medications as an outpatient.  Would plan to repeat echocardiogram 3 months after medications fully titrated and sinus rhythm reestablished.  If LV function remains decreased would need repeat ischemia evaluation.  3 persistent atrial fibrillation-patient underwent TEE guided cardioversion.  However atrial fibrillation has recurred and his rate is elevated.  Continue carvedilol at present dose.  Patient has been seen by electrophysiology.  Amiodarone was added.  Given baseline renal insufficiency and potential amiodarone interaction I will discontinue digoxin.  Plan was to load with IV amiodarone followed by repeat cardioversion while in house.  However patient request discharge today.  He does not want to stay in the hospital any longer.  We will change amiodarone to 400 mg twice daily for 1 week then 200 mg daily thereafter.  Continue apixaban.  Plan repeat cardioversion in 2 to 4 weeks.  Hopefully amiodarone will assist with maintaining sinus rhythm.  Given young age we would like to avoid amiodarone long-term.  He would then follow-up with Dr. Quentin Ore for consideration of atrial fibrillation ablation.    4 acute on chronic stage IIIa kidney disease-creatinine mildly increased today but similar to previous values.  Would check potassium and renal function Friday, June 16.  5 hypertension-blood pressure mildly elevated.  We will follow as an outpatient and advance medications as needed.  As outlined above patient request discharge.  Check being that Friday, June 16.  We will arrange follow-up with APP next week.  Cardioversion will be arranged at that time.  I have explained that he should not miss any doses of apixaban.  Follow-up with Dr. Gasper Sells 4 to 6 weeks.  For questions or updates, please contact [place] Please consult [email or web address]  for contact info under  Signed, Kirk Ruths, MD  11/15/2021, 7:28 AM

## 2021-11-16 ENCOUNTER — Telehealth: Payer: Self-pay

## 2021-11-16 NOTE — Progress Notes (Unsigned)
PCP:  Maximiano Coss, NP Primary Cardiologist: Werner Lean, MD Electrophysiologist: Will Meredith Leeds, MD   Richard [name] is a 51 y.o. male seen today for Will Meredith Leeds, MD for routine electrophysiology followup.  Since last being seen in our clinic the patient reports doing about the same. He walks about 0.25 - 0.5 mile to work after parking and wonders if that is OK. He tolerates it OK.  he denies chest pain, palpitations, dyspnea, PND, orthopnea, nausea, vomiting, dizziness, syncope, edema, weight gain, or early satiety.  Past Medical History:  Diagnosis Date   Acute decompensated heart failure ([place]) 01/03/2013   Allergy    CHF (congestive heart failure) ([facility])    systolic   Chronic kidney disease    CKD (chronic kidney disease) stage 2, GFR 60-89 ml/min 01/04/2013   DM (diabetes mellitus) ([place]) 01/06/2013   Hypertension    hx htn emergency   Obesity    Stroke [facility])    Past Surgical History:  Procedure Laterality Date   BUBBLE STUDY  05/22/2019   Procedure: BUBBLE STUDY;  Surgeon: Skeet Latch, MD;  Location: [place];  Service: Cardiovascular;;   CARDIOVERSION N/A 11/13/2021   Procedure: CARDIOVERSION;  Surgeon: Skeet Latch, MD;  Location: [place];  Service: Cardiovascular;  Laterality: N/A;   LEFT AND RIGHT HEART CATHETERIZATION WITH CORONARY ANGIOGRAM N/A 01/07/2013   Procedure: LEFT AND RIGHT HEART CATHETERIZATION WITH CORONARY ANGIOGRAM;  Surgeon: Birdie Riddle, MD;  Location: [place] CATH LAB;  Service: Cardiovascular;  Laterality: N/A;   TEE WITHOUT CARDIOVERSION N/A 05/22/2019   Procedure: TRANSESOPHAGEAL ECHOCARDIOGRAM (TEE);  Surgeon: Skeet Latch, MD;  Location: [place];  Service: Cardiovascular;  Laterality: N/A;   TEE WITHOUT CARDIOVERSION N/A 11/13/2021   Procedure: TRANSESOPHAGEAL ECHOCARDIOGRAM (TEE);  Surgeon: Skeet Latch, MD;  Location: [facility] ENDOSCOPY;  Service: Cardiovascular;  Laterality: N/A;    Current Outpatient  Medications  Medication Sig Dispense Refill   acetaminophen (TYLENOL) 500 MG tablet Take 1,000 mg by mouth every 6 (six) hours as needed for moderate pain or headache.     amiodarone (PACERONE) 400 MG tablet Take 1 tablet (400 mg total) by mouth 2 (two) times daily. Take 400 mg twice daily for 1 week then cut tablet in half and take 200 mg daily until follow-up with cardiology 30 tablet 1   apixaban (ELIQUIS) 5 MG TABS tablet Take 1 tablet (5 mg total) by mouth 2 (two) times daily. 60 tablet 1   atorvastatin (LIPITOR) 40 MG tablet Take 1 tablet (40 mg total) by mouth daily. 90 tablet 0   carvedilol (COREG) 12.5 MG tablet Take 1 tablet (12.5 mg total) by mouth 2 (two) times daily with a meal. 60 tablet 1   dapagliflozin propanediol (FARXIGA) 10 MG TABS tablet Take 1 tablet (10 mg total) by mouth daily. 30 tablet 1   furosemide (LASIX) 20 MG tablet Take 3 tablets (60 mg total) by mouth daily. 30 tablet 1   losartan (COZAAR) 25 MG tablet Take 1 tablet (25 mg total) by mouth at bedtime. 90 tablet 3   Multiple Vitamins-Minerals (MULTIVITAMIN GUMMIES ADULTS PO) Take 2 tablets by mouth daily.     Omega-3 Fatty Acids (FISH OIL) 1000 MG CAPS Take 1,000 mg by mouth daily.     potassium chloride SA (KLOR-CON M) 20 MEQ tablet Take 1 tablet (20 mEq total) by mouth 2 (two) times daily. 180 tablet 1   spironolactone (ALDACTONE) 25 MG tablet Take 1 tablet (25 mg total) by mouth daily.  30 tablet 1   No current facility-administered medications for this visit.    No Known Allergies  Social History   Socioeconomic History   Marital status: Single    Spouse name: Not on file   Number of children: 0   Years of education: Not on file   Highest education level: Bachelor's degree (e.g., BA, AB, BS)  Occupational History   Occupation: Production designer, theatre/television/film  Tobacco Use   Smoking status: Never   Smokeless tobacco: Never  Vaping Use   Vaping Use: Never used  Substance and Sexual Activity   Alcohol use: No    Drug use: No   Sexual activity: Not on file  Other Topics Concern   Not on file  Social History Narrative   Work or School: Engineer, agricultural, Tax inspector      Home Situation: lives alone      Spiritual Beliefs: Christian      Lifestyle: 15 minutes dialy walking - working up; working on diet            Social Determinants of Radio broadcast assistant Strain: Low Risk  (11/13/2021)   Overall Financial Resource Strain (CARDIA)    Difficulty of Paying Living Expenses: Not very hard  Food Insecurity: No Food Insecurity (11/13/2021)   Hunger Vital Sign    Worried About Running Out of Food in the Last Year: Never true    [place] in the Last Year: Never true  Transportation Needs: No Transportation Needs (11/13/2021)   PRAPARE - Hydrologist (Medical): No    Lack of Transportation (Non-Medical): No  Physical Activity: Sufficiently Active (06/23/2019)   Exercise Vital Sign    Days of Exercise per Week: 5 days    Minutes of Exercise per Session: 30 min  Stress: Stress Concern Present (06/23/2019)   [place]    Feeling of Stress : Rather much  Social Connections: Moderately Isolated (06/23/2019)   Social Connection and Isolation Panel [NHANES]    Frequency of Communication with Friends and Family: Three times a week    Frequency of Social Gatherings with Friends and Family: Three times a week    Attends Religious Services: 1 to 4 times per year    Active Member of Clubs or Organizations: No    Attends Archivist Meetings: Never    Marital Status: Never married  Intimate Partner Violence: Not At Risk (06/23/2019)   Humiliation, Afraid, Rape, and Kick questionnaire    Fear of Current or Ex-Partner: No    Emotionally Abused: No    Physically Abused: No    Sexually Abused: No     Review of Systems: All other systems reviewed and are otherwise  negative except as noted above.  Physical Exam: Vitals:   11/21/21 0822  BP: 118/70  Pulse: (!) 110  SpO2: 97%  Weight: (!) 313 lb (142 kg)  Height: 5\' 11"  (1.803 m)    GEN- The patient is well appearing, alert and oriented x 3 today.   HEENT: normocephalic, atraumatic; sclera clear, conjunctiva pink; hearing intact; oropharynx clear; neck supple, no JVP Lymph- no cervical lymphadenopathy Lungs- Clear to ausculation bilaterally, normal work of breathing.  No wheezes, rales, rhonchi Heart- Regular rate and rhythm, no murmurs, rubs or gallops, PMI not laterally displaced GI- soft, non-tender, non-distended, bowel sounds present, no hepatosplenomegaly Extremities- no clubbing, cyanosis, or edema; DP/PT/radial pulses 2+ bilaterally  MS- no significant deformity or atrophy Skin- warm and dry, no rash or lesion Psych- euthymic mood, full affect Neuro- strength and sensation are intact  EKG is ordered. Personal review of EKG from today shows AF RVR at 110 bpm  Additional studies reviewed include: Previous EP and admission notes.   Assessment and Plan:  1.  Persistent atrial fibrillation with RVR Had ERAF in hospital  Continue coreg Poor candidate for tikosyn given AKI and non-compliance Plan to continue short term amiodarone hopefully as bridge to ablation  Decrease amiodarone to 200 mg daily today as planned.  Will plan for [facility] next available (next week)   2. Acute on chronic systolic CHF TTE 09/07/6597 LVEF 25-30% , EF <20% by TEE.  NICM based on prior work up, potential tachy mediated component, but had been down in the past prior to AF diagnosis.  GDMT as tolerated. Will need to be adjusted and EF re-assessed prior to ablation consideration He has been on and off Entresto multiple times due to cost. Start Losartan 40 mg daily for stability.    3. AKI on CKD III Lab timing per [facility] availability.    4. HTN Stable on current regimen   Follow up with EP APP in 4 weeks from  today. May eventually be candidate for ablation with weight loss and GDMT, otherwise, may consider eventual change to tikosyn (would have to wash out)   Shirley Friar, PA-C  11/21/21 9:23 AM

## 2021-11-16 NOTE — Telephone Encounter (Signed)
Transition Care Management Unsuccessful Follow-up Telephone Call  Date of discharge and from where:  [facility]  Attempts:  2nd Attempt  Reason for unsuccessful TCM follow-up call:  No answer/busy

## 2021-11-16 NOTE — Telephone Encounter (Signed)
Transition Care Management Unsuccessful Follow-up Telephone Call  Date of discharge and from where:  [facility]/2021  Attempts:  1st Attempt  Reason for unsuccessful TCM follow-up call:  No answer/busy mailbox full unable to leave a message     L.Lamone Ferrelli,LPN

## 2021-11-17 ENCOUNTER — Other Ambulatory Visit: Payer: 59

## 2021-11-17 NOTE — Progress Notes (Unsigned)
{  [facility]_TRH_Note:[id number]} 

## 2021-11-21 ENCOUNTER — Ambulatory Visit (INDEPENDENT_AMBULATORY_CARE_PROVIDER_SITE_OTHER): Payer: 59 | Admitting: Student

## 2021-11-21 ENCOUNTER — Encounter: Payer: Self-pay | Admitting: Student

## 2021-11-21 VITALS — BP 118/70 | HR 110 | Ht 71.0 in | Wt 313.0 lb

## 2021-11-21 DIAGNOSIS — I428 Other cardiomyopathies: Secondary | ICD-10-CM | POA: Diagnosis not present

## 2021-11-21 DIAGNOSIS — I10 Essential (primary) hypertension: Secondary | ICD-10-CM | POA: Diagnosis not present

## 2021-11-21 DIAGNOSIS — Z8673 Personal history of transient ischemic attack (TIA), and cerebral infarction without residual deficits: Secondary | ICD-10-CM

## 2021-11-21 DIAGNOSIS — I5023 Acute on chronic systolic (congestive) heart failure: Secondary | ICD-10-CM | POA: Diagnosis not present

## 2021-11-21 MED ORDER — LOSARTAN POTASSIUM 25 MG PO TABS: 25.0000 mg | ORAL_TABLET | Freq: Every day | ORAL | 3 refills | Status: DC

## 2021-11-21 NOTE — Patient Instructions (Signed)
Medication Instructions:  Your physician has recommended you make the following change in your medication:   DISCONTINUE: Entresto START: Losartan 25mg  daily at bedtime  *If you need a refill on your cardiac medications before your next appointment, please call your pharmacy*   Lab Work: TODAY: BMET, CBC  If you have labs (blood work) drawn today and your tests are completely normal, you will receive your results only by: [place] (if you have MyChart) OR A paper copy in the mail If you have any lab test that is abnormal or we need to change your treatment, we will call you to review the results.   Follow-Up: At [facility], you and your health needs are our priority.  As part of our continuing mission to provide you with exceptional heart care, we have created designated Provider Care Teams.  These Care Teams include your primary Cardiologist (physician) and Advanced Practice Providers (APPs -  Physician Assistants and Nurse Practitioners) who all work together to provide you with the care you need, when you need it.   Your next appointment:   As scheduled   Other Instructions See letter for Cardioversion Instructions

## 2021-11-22 ENCOUNTER — Ambulatory Visit: Payer: 59 | Admitting: Family Medicine

## 2021-11-22 LAB — BASIC METABOLIC PANEL
BUN/Creatinine Ratio: 9 (ref 9–20)
BUN: 24 mg/dL (ref 6–24)
CO2: 21 mmol/L (ref 20–29)
Calcium: 9 mg/dL (ref 8.7–10.2)
Chloride: 105 mmol/L (ref 96–106)
Creatinine, Ser: 2.78 mg/dL — ABNORMAL HIGH (ref 0.76–1.27)
Glucose: 140 mg/dL — ABNORMAL HIGH (ref 70–99)
Potassium: 4.5 mmol/L (ref 3.5–5.2)
Sodium: 142 mmol/L (ref 134–144)
eGFR: 27 mL/min/{1.73_m2} — ABNORMAL LOW (ref >59)

## 2021-11-22 LAB — CBC
Hematocrit: 44.1 % (ref 37.5–51.0)
Hemoglobin: 14.5 g/dL (ref 13.0–17.7)
MCH: 28.8 pg (ref 26.6–33.0)
MCHC: 32.9 g/dL (ref 31.5–35.7)
MCV: 88 fL (ref 79–97)
Platelets: 235 10*3/uL (ref 150–450)
RBC: 5.04 x10E6/uL (ref 4.14–5.80)
RDW: 13.8 % (ref 11.6–15.4)
WBC: 7.5 10*3/uL (ref 3.4–10.8)

## 2021-11-23 ENCOUNTER — Encounter (HOSPITAL_COMMUNITY): Payer: Self-pay | Admitting: Cardiovascular Disease

## 2021-11-24 ENCOUNTER — Ambulatory Visit (HOSPITAL_COMMUNITY)
Admit: 2021-11-24 | Discharge: 2021-11-24 | Disposition: A | Discharge status: Home / Self Care | Payer: 59 | Attending: Cardiology | Admitting: Cardiology

## 2021-11-24 ENCOUNTER — Telehealth (HOSPITAL_COMMUNITY): Payer: Self-pay | Admitting: *Deleted

## 2021-11-24 ENCOUNTER — Encounter (HOSPITAL_COMMUNITY): Payer: Self-pay

## 2021-11-24 ENCOUNTER — Telehealth (HOSPITAL_COMMUNITY): Payer: Self-pay

## 2021-11-24 VITALS — BP 150/88 | HR 128 | Wt 315.8 lb

## 2021-11-24 DIAGNOSIS — I5042 Chronic combined systolic (congestive) and diastolic (congestive) heart failure: Secondary | ICD-10-CM | POA: Insufficient documentation

## 2021-11-24 DIAGNOSIS — Z7902 Long term (current) use of antithrombotics/antiplatelets: Secondary | ICD-10-CM | POA: Diagnosis not present

## 2021-11-24 DIAGNOSIS — E669 Obesity, unspecified: Secondary | ICD-10-CM | POA: Diagnosis not present

## 2021-11-24 DIAGNOSIS — N1832 Chronic kidney disease, stage 3b: Secondary | ICD-10-CM | POA: Insufficient documentation

## 2021-11-24 DIAGNOSIS — I10 Essential (primary) hypertension: Secondary | ICD-10-CM

## 2021-11-24 DIAGNOSIS — E1122 Type 2 diabetes mellitus with diabetic chronic kidney disease: Secondary | ICD-10-CM | POA: Insufficient documentation

## 2021-11-24 DIAGNOSIS — I4819 Other persistent atrial fibrillation: Secondary | ICD-10-CM | POA: Insufficient documentation

## 2021-11-24 DIAGNOSIS — I130 Hypertensive heart and chronic kidney disease with heart failure and stage 1 through stage 4 chronic kidney disease, or unspecified chronic kidney disease: Secondary | ICD-10-CM | POA: Insufficient documentation

## 2021-11-24 DIAGNOSIS — Z8673 Personal history of transient ischemic attack (TIA), and cerebral infarction without residual deficits: Secondary | ICD-10-CM | POA: Insufficient documentation

## 2021-11-24 DIAGNOSIS — Z7984 Long term (current) use of oral hypoglycemic drugs: Secondary | ICD-10-CM | POA: Diagnosis not present

## 2021-11-24 DIAGNOSIS — Z7901 Long term (current) use of anticoagulants: Secondary | ICD-10-CM | POA: Diagnosis not present

## 2021-11-24 DIAGNOSIS — Z79899 Other long term (current) drug therapy: Secondary | ICD-10-CM | POA: Diagnosis not present

## 2021-11-24 LAB — BASIC METABOLIC PANEL
Anion gap: 10 (ref 5–15)
BUN: 34 mg/dL — ABNORMAL HIGH (ref 6–20)
CO2: 23 mmol/L (ref 22–32)
Calcium: 8.7 mg/dL — ABNORMAL LOW (ref 8.9–10.3)
Chloride: 110 mmol/L (ref 98–111)
Creatinine, Ser: 2.85 mg/dL — ABNORMAL HIGH (ref 0.61–1.24)
GFR, Estimated: 26 mL/min — ABNORMAL LOW (ref >60)
Glucose, Bld: 184 mg/dL — ABNORMAL HIGH (ref 70–99)
Potassium: 4.5 mmol/L (ref 3.5–5.1)
Sodium: 143 mmol/L (ref 135–145)

## 2021-11-24 LAB — BRAIN NATRIURETIC PEPTIDE: B Natriuretic Peptide: 1098.4 pg/mL — ABNORMAL HIGH (ref 0.0–100.0)

## 2021-11-24 MED ORDER — TORSEMIDE 20 MG PO TABS: 40.0000 mg | ORAL_TABLET | Freq: Every day | ORAL | 3 refills | Status: DC

## 2021-11-24 MED ORDER — ISOSORB DINITRATE-HYDRALAZINE 20-37.5 MG PO TABS: 0.5000 | ORAL_TABLET | Freq: Three times a day (TID) | ORAL | 3 refills | Status: DC

## 2021-11-27 NOTE — H&P (View-Only) (Signed)
ADVANCED HF CLINIC CONSULT NOTE  Primary Care: Maximiano Coss, NP HF Cardiologist: Dr. Aundra Dubin  HPI: Richard [name] is a 51 y.o.. male with a past medical history that includes type 2 diabetes, chronic combined systolic and diastolic heart failure, hypertension, chronic kidney disease stage IIla, H/o CVA, obesity and h/o poor compliance.   Echo 09/27/2015 EF 20-25%, RV mild dilated with moderately reduced systolic function, severe LAE.  Nonischemic cardiomyopathy, likely 2/2 poorly controlled HTN.  HIV negative, SPEP negative, no M spike. UPEP negative.  Had [place] in 2014, negative for CAD.  EF was 25-30% at that time. Improved on f/u echo in 2017. EF normalized 50-55%, GIIDD. He was being followed in the [facility] (Dr. Aundra Dubin) but lost to f/u. Last seen in 2018.    In 2020, he was admitted w/ Cryptogenic stroke. 2D echo w/ normal EF, 50-55%, normal RV.  However huge discrepancy in EF by TEE. EF felt to be 20-25%, RV normal. No LV thrombus. EP was consulted for Implantable loop recorder for afib surveillance, however pt declined.    He established care w/ Dr. Gasper Sells 6/22. Seen given complaints of LEE. BP was elevated. GDMT adjusted. Losartan switched to Entresto, spironolactone added. He was ordered to get repeat echo w/ plans to start [place] at subsequent visit, but he never followed up.   Admitted 6/23 w/ a/c CHF in the setting of new atrial fibrillation w/ RVR. Echo EF back down to 25-30%, RV normal. HS trop 466>>349. LHC not perused given renal function, SCr 2.5 on admit. He was diuresed w/ IV Lasix. Placed on Eliquis for Afib. TEE LVEF 15-20%, Aortic valve did not open with each ventricular contraction, RV systolic function severely reduced, Moderate tricuspid regurgitation, Trivial mitral regurgitation, No LA/LAA thrombus or masses. Underwent DCCV x 1 back to NSR.    Unfortunately he had ERAF. EP consulted and recommended loading w/ IV amio and reattempt DCCV after load. However pt refused to  stay and opted on repeat cardioversion as an outpatient. He was transitioned to PO amiodarone 400 mg bid x 1 week then 200 mg daily thereafter. Eliquis continued. Transitioned to PO lasix + GDMT (losartan, spiro, Farxiga and coreg). SCr 2.1 day of d/c. Repeat DCCV scheduled 11/30/21. D/c wt 314 lb.    Had outpatient f/u w/ EP on 11/21/21. Was still in Afib, 110 bpm. Amiodarone reduced to 200 mg daily w/ plans to c/w outpatient DCCV. Labs showed SCr up to 2.78. BUN 24. K 4.5. CO2 21. He was instructed to hold Lasix and spiro x 1 day and increase PO intake.    Seen in TOC last week, NYHA II symptoms but volume up and SCr up to 2.8. Lasix stopped, torsemide 40 mg daily started. Arlyce Harman stopped with worsening renal function and low-dose BiDil added for afterload reduction.   Today he returns for AHF follow up. Overall feeling fine. Breathing has improved and weight is down. Denies palpitations, CP, dizziness, edema, or PND/Orthopnea. Appetite ok. No fever or chills. Weight at home 304 pounds. Taking all medications. 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   Cardiac Testing  - Echo (6/23): EF 25-30%, severe LV dysfunction with global HK, normal RV  Review of Systems: [y] = yes, [ ]  = no    General: Weight gain [ ] ; Weight loss [ ] ; Anorexia [ ] ; Fatigue [ ] ; Fever [ ] ; Chills [ ] ; Weakness [ ]   Cardiac: Chest pain/pressure [ ] ; Resting SOB [ ] ;  Exertional SOB [ ] ; Orthopnea [ ] ; Pedal Edema [ ] ; Palpitations [ ] ; Syncope [ ] ; Presyncope [ ] ; Paroxysmal nocturnal dyspnea[ ]   Pulmonary: Cough [ ] ; Wheezing[ ] ; Hemoptysis[ ] ; Sputum [ ] ; Snoring [ ]   GI: Vomiting[ ] ; Dysphagia[ ] ; Melena[ ] ; Hematochezia [ ] ; Heartburn[ ] ; Abdominal pain [ ] ; Constipation [ ] ; Diarrhea [ ] ; BRBPR [ ]   GU: Hematuria[ ] ; Dysuria [ ] ; Nocturia[ ]   Vascular: Pain in legs with walking [ ] ; Pain in feet with lying flat [ ] ; Non-healing sores [ ] ; Stroke [ ] ; TIA [ ] ; Slurred speech [ ] ;  Neuro: Headaches[ ] ;  Vertigo[ ] ; Seizures[ ] ; Paresthesias[ ] ;Blurred vision [ ] ; Diplopia [ ] ; Vision changes [ ]   Ortho/Skin: Arthritis [ ] ; Joint pain [ ] ; Muscle pain [ ] ; Joint swelling [ ] ; Back Pain [ ] ; Rash [ ]   Psych: Depression[ ] ; Anxiety[ ]   Heme: Bleeding problems [ ] ; Clotting disorders [ ] ; Anemia [ ]   Endocrine: Diabetes [Y ]; Thyroid dysfunction[ ]    Past Medical History:  Diagnosis Date   Acute decompensated heart failure ([place]) 01/03/2013   Allergy    CHF (congestive heart failure) (H[facility])    systolic   Chronic kidney disease    CKD (chronic kidney disease) stage 2, GFR 60-89 ml/min 01/04/2013   DM (diabetes mellitus) ([place]) 01/06/2013   Hypertension    hx htn emergency   Obesity    Stroke [facility]    Current Outpatient Medications  Medication Sig Dispense Refill   acetaminophen (TYLENOL) 500 MG tablet Take 1,000 mg by mouth every 6 (six) hours as needed for moderate pain or headache.     amiodarone (PACERONE) 200 MG tablet Take 200 mg by mouth daily.     apixaban (ELIQUIS) 5 MG TABS tablet Take 1 tablet (5 mg total) by mouth 2 (two) times daily. 60 tablet 1   atorvastatin (LIPITOR) 40 MG tablet Take 1 tablet (40 mg total) by mouth daily. 90 tablet 0   carvedilol (COREG) 12.5 MG tablet Take 1 tablet (12.5 mg total) by mouth 2 (two) times daily with a meal. 60 tablet 1   dapagliflozin propanediol (FARXIGA) 10 MG TABS tablet Take 1 tablet (10 mg total) by mouth daily. 30 tablet 1   isosorbide-hydrALAZINE (BIDIL) 20-37.5 MG tablet Take 0.5 tablets by mouth 3 (three) times daily. 135 tablet 3   losartan (COZAAR) 25 MG tablet Take 1 tablet (25 mg total) by mouth at bedtime. 90 tablet 3   Multiple Vitamins-Minerals (MULTIVITAMIN GUMMIES ADULTS PO) Take 2 tablets by mouth daily.     potassium chloride SA (KLOR-CON M) 20 MEQ tablet Take 1 tablet (20 mEq total) by mouth 2 (two) times daily. 180 tablet 1   Omega-3 Fatty Acids (FISH OIL) 1000 MG CAPS Take 1,000 mg by mouth daily. (Patient not taking: Reported  on 11/28/2021)     torsemide (DEMADEX) 20 MG tablet Take 2 tablets (40 mg total) by mouth daily. 180 tablet 3   No current facility-administered medications for this encounter.   No Known Allergies  Social History   Socioeconomic History   Marital status: Single    Spouse name: Not on file   Number of children: 0   Years of education: Not on file   Highest education level: Bachelor's degree (e.g., BA, AB, BS)  Occupational History   Occupation: Production designer, theatre/television/film  Tobacco Use   Smoking status: Never   Smokeless tobacco: Never  Vaping Use   Vaping  Use: Never used  Substance and Sexual Activity   Alcohol use: No   Drug use: No   Sexual activity: Not on file  Other Topics Concern   Not on file  Social History Narrative   Work or School: Engineer, agricultural, Tax inspector      Home Situation: lives alone      Spiritual Beliefs: Christian      Lifestyle: 15 minutes dialy walking - working up; working on diet            Social Determinants of Radio broadcast assistant Strain: Low Risk  (11/13/2021)   Overall Financial Resource Strain (CARDIA)    Difficulty of Paying Living Expenses: Not very hard  Food Insecurity: No Food Insecurity (11/13/2021)   Hunger Vital Sign    Worried About Running Out of Food in the Last Year: Never true    Ran Out of Food in the Last Year: Never true  Transportation Needs: No Transportation Needs (11/13/2021)   PRAPARE - Hydrologist (Medical): No    Lack of Transportation (Non-Medical): No  Physical Activity: Sufficiently Active (11/24/2021)   Exercise Vital Sign    Days of Exercise per Week: 7 days    Minutes of Exercise per Session: 30 min  Stress: No Stress Concern Present (11/24/2021)   [place]    Feeling of Stress : Only a little  Social Connections: Moderately Integrated (11/24/2021)   Social Connection and Isolation  Panel [NHANES]    Frequency of Communication with Friends and Family: More than three times a week    Frequency of Social Gatherings with Friends and Family: Three times a week    Attends Religious Services: More than 4 times per year    Active Member of Clubs or Organizations: Yes    Attends Archivist Meetings: More than 4 times per year    Marital Status: Never married  Intimate Partner Violence: Not At Risk (06/23/2019)   Humiliation, Afraid, Rape, and Kick questionnaire    Fear of Current or Ex-Partner: No    Emotionally Abused: No    Physically Abused: No    Sexually Abused: No   Family History  Problem Relation Age of Onset   Heart disease Father 61       MI   Hypertension Father    Hyperlipidemia Father    Diabetes Mother    Hypertension Mother    BP (!) 138/114   Pulse 99   Wt (!) 138.7 kg (305 lb 12.8 oz)   SpO2 98%   BMI 42.65 kg/m   Wt Readings from Last 3 Encounters:  11/28/21 (!) 138.7 kg (305 lb 12.8 oz)  11/24/21 (!) 143.2 kg (315 lb 12.8 oz)  11/21/21 (!) 142 kg (313 lb)   PHYSICAL EXAM: General:  NAD. No resp difficulty HEENT: Normal Neck: Supple. No JVD. Carotids 2+ bilat; no bruits. No lymphadenopathy or thryomegaly appreciated. Cor: PMI nondisplaced. Irregular rate & rhythm. No rubs, gallops or murmurs. Lungs: Clear Abdomen: Obese, soft, nontender, nondistended. No hepatosplenomegaly. No bruits or masses. Good bowel sounds. Extremities: No cyanosis, clubbing, rash, edema Neuro: Alert & oriented x 3, cranial nerves grossly intact. Moves all 4 extremities w/o difficulty. Affect pleasant.  ASSESSMENT & PLAN: 1. Chronic Systolic Heart Failure - NICM, likely 2/2 poorly controlled HTN + tachymediated from rapid Afib . - Echo (2014): EF 25-30%, LHC no CAD  - Echo (4/17):  EF 20-25%, RV mild dilated with moderately reduced systolic function, severe LAE - SPEP negative, no M spike. UPEP negative in 2017  - Echo (10/17): EF normalized, 50-55% -  Echo (12/20): EF 50-55% - Echo (6/23): EF 25-30%, global HK, RV normal. In setting of new Afib w/ RVR, uncontrolled hypertension and poor compliance w/ meds  - Not current candidate for LHC given CKD w/ b/l SCr >2 - NYHA II. He is not volume overloaded on exam.  - remains in persistent Afib. Restoration of NSR imperative. Plan repeat DCCV this week (see below). - Continue losartan 25 mg daily. BMET/BNP today. - Continue Farxiga 10 mg daily. - Continue Coreg 12.5 mg bid. - Continue BiDil 0.5 tablet tid. - Continue torsemide 40 mg daily.  - Off spiro with elevated SCr 2.8. - No digoxin w/ CKD. - Plan to repeat echo after GDMT optimized and restoration of NSR.   2. Persistent Atrial Fibrillation  - Diagnosed 6/23 - s/p TEE/DCCV to NSR w/ ERAF - Continue amiodarone 200 mg daily. Plan for repeat DCCV 11/30/21. - Continue Eliquis 5 mg bid. No bleeding issues. Has not missed any doses. - Continue Coreg 12.5 mg bid.    - Followed in [facility]. Needs to be considered for Afib ablation once stabilized from HF standpoint. LA diameter 4.40 cm - ? Underlying OSA. Arrange sleep study.   3. Hypertension  - Poorly controlled. Elevated today, he is due for afternoon dose of BiDil. - GDMT per above. I asked him to check BP at home and log.  - Sleep study as above.   4. CKD Stage IIIb - IV - Suspect hypertensive/ DM nephropathy  - Baseline SCr 2.1-2.5   - No off spiro with worsening SCr of 2.8. - Continue Farxiga 10 mg daily.  - He has been referred to [facility].    5. Type 2 DM  - Recent Hgb A1c 6.4.  - Continue SGLT2i.   6. H/o CVA  - Cryptogenetic (2020). Refused loop recorder. - ? If cardioembolic, given recent afib detection. - Now on Eliquis 5 mg bid     Follow up in 2-3 weeks with APP and 3 months with Dr. Aundra Dubin + echo.  FMLA papers received today, placed in appropriate folder.   Allena Katz, FNP-BC 11/28/21

## 2021-11-28 ENCOUNTER — Ambulatory Visit (HOSPITAL_COMMUNITY)
Admission: RE | Admit: 2021-11-28 | Discharge: 2021-11-28 | Disposition: A | Discharge status: Home / Self Care | Payer: 59 | Source: Ambulatory Visit | Attending: Family Medicine | Admitting: Family Medicine

## 2021-11-28 ENCOUNTER — Encounter (HOSPITAL_COMMUNITY): Payer: Self-pay

## 2021-11-28 VITALS — BP 138/114 | HR 99 | Wt 305.8 lb

## 2021-11-28 DIAGNOSIS — E1169 Type 2 diabetes mellitus with other specified complication: Secondary | ICD-10-CM

## 2021-11-28 DIAGNOSIS — Z7901 Long term (current) use of anticoagulants: Secondary | ICD-10-CM | POA: Diagnosis not present

## 2021-11-28 DIAGNOSIS — E669 Obesity, unspecified: Secondary | ICD-10-CM

## 2021-11-28 DIAGNOSIS — I5042 Chronic combined systolic (congestive) and diastolic (congestive) heart failure: Secondary | ICD-10-CM | POA: Diagnosis present

## 2021-11-28 DIAGNOSIS — I5020 Unspecified systolic (congestive) heart failure: Secondary | ICD-10-CM | POA: Diagnosis not present

## 2021-11-28 DIAGNOSIS — Z79899 Other long term (current) drug therapy: Secondary | ICD-10-CM | POA: Insufficient documentation

## 2021-11-28 DIAGNOSIS — I428 Other cardiomyopathies: Secondary | ICD-10-CM | POA: Insufficient documentation

## 2021-11-28 DIAGNOSIS — N1832 Chronic kidney disease, stage 3b: Secondary | ICD-10-CM | POA: Insufficient documentation

## 2021-11-28 DIAGNOSIS — I10 Essential (primary) hypertension: Secondary | ICD-10-CM | POA: Diagnosis not present

## 2021-11-28 DIAGNOSIS — I130 Hypertensive heart and chronic kidney disease with heart failure and stage 1 through stage 4 chronic kidney disease, or unspecified chronic kidney disease: Secondary | ICD-10-CM | POA: Diagnosis not present

## 2021-11-28 DIAGNOSIS — Z7984 Long term (current) use of oral hypoglycemic drugs: Secondary | ICD-10-CM | POA: Insufficient documentation

## 2021-11-28 DIAGNOSIS — E1122 Type 2 diabetes mellitus with diabetic chronic kidney disease: Secondary | ICD-10-CM | POA: Diagnosis not present

## 2021-11-28 DIAGNOSIS — E1159 Type 2 diabetes mellitus with other circulatory complications: Secondary | ICD-10-CM

## 2021-11-28 DIAGNOSIS — Z6841 Body Mass Index (BMI) 40.0 and over, adult: Secondary | ICD-10-CM | POA: Diagnosis not present

## 2021-11-28 DIAGNOSIS — I4819 Other persistent atrial fibrillation: Secondary | ICD-10-CM | POA: Insufficient documentation

## 2021-11-28 DIAGNOSIS — Z91148 Patient's other noncompliance with medication regimen for other reason: Secondary | ICD-10-CM | POA: Insufficient documentation

## 2021-11-28 DIAGNOSIS — Z8673 Personal history of transient ischemic attack (TIA), and cerebral infarction without residual deficits: Secondary | ICD-10-CM | POA: Diagnosis not present

## 2021-11-28 DIAGNOSIS — I152 Hypertension secondary to endocrine disorders: Secondary | ICD-10-CM

## 2021-11-28 LAB — BASIC METABOLIC PANEL
Anion gap: 11 (ref 5–15)
BUN: 22 mg/dL — ABNORMAL HIGH (ref 6–20)
CO2: 28 mmol/L (ref 22–32)
Calcium: 8.7 mg/dL — ABNORMAL LOW (ref 8.9–10.3)
Chloride: 103 mmol/L (ref 98–111)
Creatinine, Ser: 2.69 mg/dL — ABNORMAL HIGH (ref 0.61–1.24)
GFR, Estimated: 28 mL/min — ABNORMAL LOW (ref >60)
Glucose, Bld: 135 mg/dL — ABNORMAL HIGH (ref 70–99)
Potassium: 3.7 mmol/L (ref 3.5–5.1)
Sodium: 142 mmol/L (ref 135–145)

## 2021-11-28 LAB — BRAIN NATRIURETIC PEPTIDE: B Natriuretic Peptide: 696.5 pg/mL — ABNORMAL HIGH (ref 0.0–100.0)

## 2021-11-29 ENCOUNTER — Encounter (HOSPITAL_COMMUNITY): Payer: Self-pay | Admitting: *Deleted

## 2021-11-30 ENCOUNTER — Ambulatory Visit (HOSPITAL_COMMUNITY): Payer: 59 | Admitting: Certified Registered Nurse Anesthetist

## 2021-11-30 ENCOUNTER — Other Ambulatory Visit: Payer: Self-pay

## 2021-11-30 ENCOUNTER — Ambulatory Visit (HOSPITAL_COMMUNITY)
Admission: RE | Admit: 2021-11-30 | Discharge: 2021-11-30 | Disposition: A | Discharge status: Home / Self Care | Payer: 59 | Attending: Cardiovascular Disease | Admitting: Cardiovascular Disease

## 2021-11-30 ENCOUNTER — Encounter (HOSPITAL_COMMUNITY): Payer: Self-pay | Admitting: Cardiovascular Disease

## 2021-11-30 ENCOUNTER — Ambulatory Visit (HOSPITAL_BASED_OUTPATIENT_CLINIC_OR_DEPARTMENT_OTHER): Payer: 59 | Admitting: Certified Registered Nurse Anesthetist

## 2021-11-30 ENCOUNTER — Encounter (HOSPITAL_COMMUNITY)
Admission: RE | Disposition: A | Discharge status: Home / Self Care | Payer: Self-pay | Source: Home / Self Care | Attending: Cardiovascular Disease

## 2021-11-30 DIAGNOSIS — E119 Type 2 diabetes mellitus without complications: Secondary | ICD-10-CM | POA: Diagnosis not present

## 2021-11-30 DIAGNOSIS — N1832 Chronic kidney disease, stage 3b: Secondary | ICD-10-CM | POA: Diagnosis not present

## 2021-11-30 DIAGNOSIS — I4819 Other persistent atrial fibrillation: Secondary | ICD-10-CM | POA: Diagnosis present

## 2021-11-30 DIAGNOSIS — E1122 Type 2 diabetes mellitus with diabetic chronic kidney disease: Secondary | ICD-10-CM | POA: Insufficient documentation

## 2021-11-30 DIAGNOSIS — I4891 Unspecified atrial fibrillation: Secondary | ICD-10-CM

## 2021-11-30 DIAGNOSIS — Z6841 Body Mass Index (BMI) 40.0 and over, adult: Secondary | ICD-10-CM | POA: Diagnosis not present

## 2021-11-30 DIAGNOSIS — Z79899 Other long term (current) drug therapy: Secondary | ICD-10-CM | POA: Insufficient documentation

## 2021-11-30 DIAGNOSIS — I428 Other cardiomyopathies: Secondary | ICD-10-CM | POA: Insufficient documentation

## 2021-11-30 DIAGNOSIS — Z7901 Long term (current) use of anticoagulants: Secondary | ICD-10-CM | POA: Diagnosis not present

## 2021-11-30 DIAGNOSIS — I509 Heart failure, unspecified: Secondary | ICD-10-CM

## 2021-11-30 DIAGNOSIS — I130 Hypertensive heart and chronic kidney disease with heart failure and stage 1 through stage 4 chronic kidney disease, or unspecified chronic kidney disease: Secondary | ICD-10-CM | POA: Insufficient documentation

## 2021-11-30 DIAGNOSIS — E669 Obesity, unspecified: Secondary | ICD-10-CM | POA: Insufficient documentation

## 2021-11-30 DIAGNOSIS — I5022 Chronic systolic (congestive) heart failure: Secondary | ICD-10-CM | POA: Diagnosis not present

## 2021-11-30 DIAGNOSIS — Z7984 Long term (current) use of oral hypoglycemic drugs: Secondary | ICD-10-CM | POA: Insufficient documentation

## 2021-11-30 DIAGNOSIS — Z8673 Personal history of transient ischemic attack (TIA), and cerebral infarction without residual deficits: Secondary | ICD-10-CM | POA: Insufficient documentation

## 2021-11-30 DIAGNOSIS — I110 Hypertensive heart disease with heart failure: Secondary | ICD-10-CM

## 2021-11-30 HISTORY — PX: CARDIOVERSION: SHX1299

## 2021-11-30 SURGERY — CARDIOVERSION
Anesthesia: General

## 2021-11-30 MED ORDER — PROPOFOL 10 MG/ML IV BOLUS
INTRAVENOUS | Status: DC | PRN
  Administered 2021-11-30: 100 mg via INTRAVENOUS
  Administered 2021-11-30: 50 mg via INTRAVENOUS

## 2021-11-30 MED ORDER — LIDOCAINE 2% (20 MG/ML) 5 ML SYRINGE
INTRAMUSCULAR | Status: DC | PRN
  Administered 2021-11-30: 40 mg via INTRAVENOUS

## 2021-11-30 MED ORDER — SODIUM CHLORIDE 0.9 % IV SOLN
INTRAVENOUS | Status: AC | PRN
  Administered 2021-11-30: 500 mL via INTRAVENOUS

## 2021-11-30 NOTE — Transfer of Care (Signed)
Immediate Anesthesia Transfer of Care Note  Patient: Richard [name]  Procedure(s) Performed: CARDIOVERSION  Patient Location: PACU and Endoscopy Unit  Anesthesia Type:General  Level of Consciousness: sedated  Airway & Oxygen Therapy: Patient Spontanous Breathing and Patient connected to nasal cannula oxygen  Post-op Assessment: Report given to RN and Post -op Vital signs reviewed and stable  Post vital signs: Reviewed and stable  Last Vitals:  Vitals Value Taken Time  BP 117/86   Temp    Pulse 74   Resp 33   SpO2 98     Last Pain:  Vitals:   11/30/21 1059  PainSc: 0-No pain         Complications: No notable events documented.

## 2021-11-30 NOTE — Anesthesia Preprocedure Evaluation (Addendum)
Anesthesia Evaluation  Patient identified by MRN, date of birth, ID band Patient awake    Reviewed: Allergy & Precautions, H&P , NPO status , Patient's Chart, lab work & pertinent test results  Airway Mallampati: II  TM Distance: >3 FB Neck ROM: Full    Dental  (+) Teeth Intact, Implants, Dental Advisory Given   Pulmonary neg pulmonary ROS,    breath sounds clear to auscultation       Cardiovascular hypertension, +CHF  + dysrhythmias Atrial Fibrillation  Rhythm:irregular Rate:Normal     Neuro/Psych CVA    GI/Hepatic   Endo/Other  diabetes, Type 2Morbid obesity  Renal/GU Renal InsufficiencyRenal disease     Musculoskeletal   Abdominal   Peds  Hematology   Anesthesia Other Findings   Reproductive/Obstetrics                            Anesthesia Physical Anesthesia Plan  ASA: 3  Anesthesia Plan: General   Post-op Pain Management:    Induction: Intravenous  PONV Risk Score and Plan: 2 and Propofol infusion and Treatment may vary due to age or medical condition  Airway Management Planned: Mask  Additional Equipment:   Intra-op Plan:   Post-operative Plan:   Informed Consent: I have reviewed the patients History and Physical, chart, labs and discussed the procedure including the risks, benefits and alternatives for the proposed anesthesia with the patient or authorized representative who has indicated his/her understanding and acceptance.     Dental advisory given  Plan Discussed with: Anesthesiologist, CRNA and Surgeon  Anesthesia Plan Comments:         Anesthesia Quick Evaluation

## 2021-11-30 NOTE — CV Procedure (Signed)
[place]: Anesthesia:  Propofol On Rx anticoagulation with no missed doses  DCC x 4 all 200 J biphasic last two with manual compression  Converted to NSR rate 62 bpm No immediate neurologic sequelae  Jenkins Rouge MD [facility]

## 2021-11-30 NOTE — Anesthesia Procedure Notes (Signed)
Procedure Name: General with mask airway Date/Time: 11/30/2021 11:55 AM  Performed by: Lowella Dell, CRNAPre-anesthesia Checklist: Patient identified, Emergency Drugs available, Suction available, Patient being monitored and Timeout performed Patient Re-evaluated:Patient Re-evaluated prior to induction Oxygen Delivery Method: Ambu bag Preoxygenation: Pre-oxygenation with 100% oxygen Induction Type: IV induction Placement Confirmation: positive ETCO2 Dental Injury: Teeth and Oropharynx as per pre-operative assessment

## 2021-11-30 NOTE — Interval H&P Note (Signed)
History and Physical Interval Note:  11/30/2021 12:08 PM  Richard [name]  has presented today for surgery, with the diagnosis of Atrial fibrillation.  The various methods of treatment have been discussed with the patient and family. After consideration of risks, benefits and other options for treatment, the patient has consented to  Procedure(s): CARDIOVERSION (N/A) as a surgical intervention.  The patient's history has been reviewed, patient examined, no change in status, stable for surgery.  I have reviewed the patient's chart and labs.  Questions were answered to the patient's satisfaction.     Jenkins Rouge

## 2021-11-30 NOTE — Discharge Instructions (Signed)

## 2021-12-01 ENCOUNTER — Encounter (HOSPITAL_COMMUNITY): Payer: Self-pay | Admitting: Cardiovascular Disease

## 2021-12-01 NOTE — Anesthesia Postprocedure Evaluation (Signed)
Anesthesia Post Note  Patient: Richard [name]  Procedure(s) Performed: CARDIOVERSION     Patient location during evaluation: Endoscopy Anesthesia Type: General Level of consciousness: awake and alert Pain management: pain level controlled Vital Signs Assessment: post-procedure vital signs reviewed and stable Respiratory status: spontaneous breathing, nonlabored ventilation, respiratory function stable and patient connected to nasal cannula oxygen Cardiovascular status: blood pressure returned to baseline and stable Postop Assessment: no apparent nausea or vomiting Anesthetic complications: no   No notable events documented.  Last Vitals:  Vitals:   11/30/21 1215 11/30/21 1220  BP: 127/84 125/88  Pulse: 70 70  Resp: (!) 29 (!) 28  Temp:    SpO2: 99% 97%    Last Pain:  Vitals:   11/30/21 1215  TempSrc:   PainSc: 0-No pain                 [name]

## 2021-12-08 ENCOUNTER — Ambulatory Visit (INDEPENDENT_AMBULATORY_CARE_PROVIDER_SITE_OTHER): Payer: 59

## 2021-12-08 ENCOUNTER — Ambulatory Visit: Payer: 59

## 2021-12-08 ENCOUNTER — Encounter: Payer: Self-pay | Admitting: Podiatry

## 2021-12-08 ENCOUNTER — Telehealth (HOSPITAL_COMMUNITY): Payer: Self-pay | Admitting: Surgery

## 2021-12-08 ENCOUNTER — Ambulatory Visit (INDEPENDENT_AMBULATORY_CARE_PROVIDER_SITE_OTHER): Payer: 59 | Admitting: Podiatry

## 2021-12-08 DIAGNOSIS — M79674 Pain in right toe(s): Secondary | ICD-10-CM

## 2021-12-08 DIAGNOSIS — M79675 Pain in left toe(s): Secondary | ICD-10-CM | POA: Diagnosis not present

## 2021-12-08 DIAGNOSIS — B351 Tinea unguium: Secondary | ICD-10-CM | POA: Diagnosis not present

## 2021-12-08 DIAGNOSIS — M722 Plantar fascial fibromatosis: Secondary | ICD-10-CM | POA: Diagnosis not present

## 2021-12-08 DIAGNOSIS — M7662 Achilles tendinitis, left leg: Secondary | ICD-10-CM | POA: Diagnosis not present

## 2021-12-08 DIAGNOSIS — D689 Coagulation defect, unspecified: Secondary | ICD-10-CM | POA: Diagnosis not present

## 2021-12-08 MED ORDER — TRIAMCINOLONE ACETONIDE 10 MG/ML IJ SUSP
10.0000 mg | Freq: Once | INTRAMUSCULAR | Status: AC
  Administered 2021-12-08: 10 mg

## 2021-12-08 NOTE — Telephone Encounter (Signed)
I attempted to reach patient to inform him that he can proceed with ordered home sleep study. Per Clinic [facility] insurance pre cert is not required.  I was unable to leave a phone message because voicemail was full.  I did send a Mychart message to indicate that he can proceed.

## 2021-12-09 NOTE — Progress Notes (Signed)
Subjective:   Patient ID: Richard [name], male   DOB: 51 y.o.   MRN: [id number]   HPI Patient presents with intense discomfort plantar aspect left heel with moderate obesity also noted and history of just having heart issues with cardioversion A-fib lactic acid issues.  Patient does work a weightbearing type job and is currently off until he is in better health and does not smoke likes to be active   Review of Systems  All other systems reviewed and are negative.       Objective:  Physical Exam Vitals and nursing note reviewed.  Constitutional:      Appearance: He is well-developed.  Pulmonary:     Effort: Pulmonary effort is normal.  Musculoskeletal:        General: Normal range of motion.  Skin:    General: Skin is warm.  Neurological:     Mental Status: He is alert.     Neurovascular status intact muscle strength found to be adequate range of motion adequate moderate flatfoot deformity with exquisite discomfort plantar aspect left heel at the insertional point tendon calcaneus.  Patient does have good digital perfusion he is well oriented     Assessment:  Patient was just had recent health issues with heart dysfunction swelling of the lower extremities and probability for acute plantar fascial symptomatology which may be due to the swelling condition     Plan:  H&P reviewed condition and went ahead today did sterile prep injected the plantar fascia left 3 mg Kenalog 5 mg Xylocaine and reviewed x-rays.  Reappoint may require orthotics or other treatment reevaluate  X-rays indicate that there is flattening of the arch there is spur formation no indication stress fracture arthritis

## 2021-12-11 ENCOUNTER — Encounter (HOSPITAL_COMMUNITY): Payer: Self-pay

## 2021-12-11 ENCOUNTER — Ambulatory Visit (HOSPITAL_COMMUNITY)
Admission: RE | Admit: 2021-12-11 | Discharge: 2021-12-11 | Disposition: A | Discharge status: Home / Self Care | Payer: 59 | Source: Ambulatory Visit | Attending: Family Medicine | Admitting: Family Medicine

## 2021-12-11 VITALS — BP 148/104 | HR 78 | Wt 303.6 lb

## 2021-12-11 DIAGNOSIS — E1169 Type 2 diabetes mellitus with other specified complication: Secondary | ICD-10-CM

## 2021-12-11 DIAGNOSIS — I10 Essential (primary) hypertension: Secondary | ICD-10-CM | POA: Diagnosis not present

## 2021-12-11 DIAGNOSIS — Z7984 Long term (current) use of oral hypoglycemic drugs: Secondary | ICD-10-CM | POA: Diagnosis not present

## 2021-12-11 DIAGNOSIS — E1122 Type 2 diabetes mellitus with diabetic chronic kidney disease: Secondary | ICD-10-CM | POA: Insufficient documentation

## 2021-12-11 DIAGNOSIS — I5042 Chronic combined systolic (congestive) and diastolic (congestive) heart failure: Secondary | ICD-10-CM | POA: Insufficient documentation

## 2021-12-11 DIAGNOSIS — I428 Other cardiomyopathies: Secondary | ICD-10-CM | POA: Diagnosis not present

## 2021-12-11 DIAGNOSIS — I130 Hypertensive heart and chronic kidney disease with heart failure and stage 1 through stage 4 chronic kidney disease, or unspecified chronic kidney disease: Secondary | ICD-10-CM | POA: Diagnosis not present

## 2021-12-11 DIAGNOSIS — Z79899 Other long term (current) drug therapy: Secondary | ICD-10-CM | POA: Insufficient documentation

## 2021-12-11 DIAGNOSIS — Z7901 Long term (current) use of anticoagulants: Secondary | ICD-10-CM | POA: Insufficient documentation

## 2021-12-11 DIAGNOSIS — Z7985 Long-term (current) use of injectable non-insulin antidiabetic drugs: Secondary | ICD-10-CM | POA: Diagnosis not present

## 2021-12-11 DIAGNOSIS — Z91148 Patient's other noncompliance with medication regimen for other reason: Secondary | ICD-10-CM | POA: Insufficient documentation

## 2021-12-11 DIAGNOSIS — G4733 Obstructive sleep apnea (adult) (pediatric): Secondary | ICD-10-CM | POA: Insufficient documentation

## 2021-12-11 DIAGNOSIS — E1121 Type 2 diabetes mellitus with diabetic nephropathy: Secondary | ICD-10-CM | POA: Insufficient documentation

## 2021-12-11 DIAGNOSIS — E669 Obesity, unspecified: Secondary | ICD-10-CM | POA: Diagnosis not present

## 2021-12-11 DIAGNOSIS — I4819 Other persistent atrial fibrillation: Secondary | ICD-10-CM | POA: Insufficient documentation

## 2021-12-11 DIAGNOSIS — I5022 Chronic systolic (congestive) heart failure: Secondary | ICD-10-CM

## 2021-12-11 DIAGNOSIS — N1832 Chronic kidney disease, stage 3b: Secondary | ICD-10-CM | POA: Insufficient documentation

## 2021-12-11 DIAGNOSIS — Z8673 Personal history of transient ischemic attack (TIA), and cerebral infarction without residual deficits: Secondary | ICD-10-CM | POA: Diagnosis not present

## 2021-12-11 DIAGNOSIS — E1159 Type 2 diabetes mellitus with other circulatory complications: Secondary | ICD-10-CM

## 2021-12-11 DIAGNOSIS — I152 Hypertension secondary to endocrine disorders: Secondary | ICD-10-CM

## 2021-12-11 LAB — BASIC METABOLIC PANEL
Anion gap: 9 (ref 5–15)
BUN: 28 mg/dL — ABNORMAL HIGH (ref 6–20)
CO2: 29 mmol/L (ref 22–32)
Calcium: 8.7 mg/dL — ABNORMAL LOW (ref 8.9–10.3)
Chloride: 103 mmol/L (ref 98–111)
Creatinine, Ser: 2.03 mg/dL — ABNORMAL HIGH (ref 0.61–1.24)
GFR, Estimated: 39 mL/min — ABNORMAL LOW (ref >60)
Glucose, Bld: 148 mg/dL — ABNORMAL HIGH (ref 70–99)
Potassium: 5.2 mmol/L — ABNORMAL HIGH (ref 3.5–5.1)
Sodium: 141 mmol/L (ref 135–145)

## 2021-12-11 MED ORDER — CARVEDILOL 12.5 MG PO TABS: 18.7500 mg | ORAL_TABLET | Freq: Two times a day (BID) | ORAL | 3 refills | Status: DC

## 2021-12-11 MED ORDER — ISOSORB DINITRATE-HYDRALAZINE 20-37.5 MG PO TABS: 1.0000 | ORAL_TABLET | Freq: Three times a day (TID) | ORAL | 3 refills | Status: DC

## 2021-12-11 NOTE — Patient Instructions (Signed)
INCREASE Bidil to one tab three times per day INCREASE Coreg to 18.75 mg (one and one half) tab twice a day  Please be sure to complete sleep study Your provider has recommended that you have a home sleep study.  This has to be approved by your insurance company. We will schedule you an appointment to pick up the equipment to give [place] time to complete this authorization. Once you have the equipment you will download the app on your phone and follow the instructions. YOUR PIN NUMBER IS: [id number]. Once you have completed the test the information is sent to the company through [employer] and you can dispose of the equipment. If your test is positive you will receive a call from Dr Theodosia Blender office [facility]) to set up your CPAP equipment.   Labs today We will only contact you if something comes back abnormal or we need to make some changes. Otherwise no news is good news!   Keep cardiology follow up as scheduled with Dr Aundra Dubin  Do the following things EVERYDAY: Weigh yourself in the morning before breakfast. Write it down and keep it in a log. Take your medicines as prescribed Eat low salt foods--Limit salt (sodium) to 2000 mg per day.  Stay as active as you can everyday Limit all fluids for the day to less than 2 liters  At the [facility], you and your health needs are our priority. As part of our continuing mission to provide you with exceptional heart care, we have created designated Provider Care Teams. These Care Teams include your primary Cardiologist (physician) and Advanced Practice Providers (APPs- Physician Assistants and Nurse Practitioners) who all work together to provide you with the care you need, when you need it.   You may see any of the following providers on your designated Care Team at your next follow up: Dr Glori Bickers Dr Haynes Kerns, NP Lyda Jester, [place] [facility] [place], [place] Audry Riles,  PharmD   Please be sure to bring in all your medications bottles to every appointment.

## 2021-12-11 NOTE — Progress Notes (Signed)
PCP:  Maximiano Coss, NP Primary Cardiologist: Werner Lean, MD Electrophysiologist: Will Meredith Leeds, MD   Richard [name] is a 51 y.o. male seen today for Will Meredith Leeds, MD for routine electrophysiology followup.  Since last being seen in our clinic the patient reports doing about the same. Having some joint pain in his ankle, and steroid shot did help some. Having trouble exercising in light of this. Would like to lose weight and be considered for ablation.  he denies chest pain, dyspnea, PND, orthopnea, nausea, vomiting, dizziness, syncope, edema, weight gain, or early satiety.  Past Medical History:  Diagnosis Date   Acute decompensated heart failure ([place]) 01/03/2013   Allergy    CHF (congestive heart failure) ([facility])    systolic   Chronic kidney disease    CKD (chronic kidney disease) stage 2, GFR 60-89 ml/min 01/04/2013   DM (diabetes mellitus) ([place]) 01/06/2013   Hypertension    hx htn emergency   Obesity    Stroke [facility])    Past Surgical History:  Procedure Laterality Date   BUBBLE STUDY  05/22/2019   Procedure: BUBBLE STUDY;  Surgeon: Skeet Latch, MD;  Location: [place];  Service: Cardiovascular;;   CARDIOVERSION N/A 11/13/2021   Procedure: CARDIOVERSION;  Surgeon: Skeet Latch, MD;  Location: [employer];  Service: Cardiovascular;  Laterality: N/A;   CARDIOVERSION N/A 11/30/2021   Procedure: CARDIOVERSION;  Surgeon: Josue Hector, MD;  Location: [place];  Service: Cardiovascular;  Laterality: N/A;   LEFT AND RIGHT HEART CATHETERIZATION WITH CORONARY ANGIOGRAM N/A 01/07/2013   Procedure: LEFT AND RIGHT HEART CATHETERIZATION WITH CORONARY ANGIOGRAM;  Surgeon: Birdie Riddle, MD;  Location: [place] CATH LAB;  Service: Cardiovascular;  Laterality: N/A;   TEE WITHOUT CARDIOVERSION N/A 05/22/2019   Procedure: TRANSESOPHAGEAL ECHOCARDIOGRAM (TEE);  Surgeon: Skeet Latch, MD;  Location: [place];  Service: Cardiovascular;  Laterality: N/A;    TEE WITHOUT CARDIOVERSION N/A 11/13/2021   Procedure: TRANSESOPHAGEAL ECHOCARDIOGRAM (TEE);  Surgeon: Skeet Latch, MD;  Location: [facility] ENDOSCOPY;  Service: Cardiovascular;  Laterality: N/A;    Current Outpatient Medications  Medication Sig Dispense Refill   acetaminophen (TYLENOL) 500 MG tablet Take 1,000 mg by mouth every 6 (six) hours as needed for moderate pain or headache.     amiodarone (PACERONE) 200 MG tablet Take 200 mg by mouth daily.     apixaban (ELIQUIS) 5 MG TABS tablet Take 1 tablet (5 mg total) by mouth 2 (two) times daily. 60 tablet 1   atorvastatin (LIPITOR) 40 MG tablet Take 1 tablet (40 mg total) by mouth daily. 90 tablet 0   carvedilol (COREG) 12.5 MG tablet Take 1.5 tablets (18.75 mg total) by mouth 2 (two) times daily with a meal. 90 tablet 3   dapagliflozin propanediol (FARXIGA) 10 MG TABS tablet Take 1 tablet (10 mg total) by mouth daily. 30 tablet 1   isosorbide-hydrALAZINE (BIDIL) 20-37.5 MG tablet Take 1 tablet by mouth 3 (three) times daily. 90 tablet 3   losartan (COZAAR) 25 MG tablet Take 1 tablet (25 mg total) by mouth at bedtime. 90 tablet 3   Multiple Vitamins-Minerals (MULTIVITAMIN GUMMIES ADULTS PO) Take 2 tablets by mouth daily.     Omega-3 Fatty Acids (FISH OIL) 1000 MG CAPS Take 1,000 mg by mouth daily.     potassium chloride SA (KLOR-CON M) 20 MEQ tablet Take 1 tablet (20 mEq total) by mouth 2 (two) times daily. 180 tablet 1   torsemide (DEMADEX) 20 MG tablet Take 2 tablets (40 mg  total) by mouth daily. 180 tablet 3   No current facility-administered medications for this visit.    No Known Allergies  Social History   Socioeconomic History   Marital status: Single    Spouse name: Not on file   Number of children: 0   Years of education: Not on file   Highest education level: Bachelor's degree (e.g., BA, AB, BS)  Occupational History   Occupation: Production designer, theatre/television/film  Tobacco Use   Smoking status: Never   Smokeless tobacco: Never  Vaping  Use   Vaping Use: Never used  Substance and Sexual Activity   Alcohol use: No   Drug use: No   Sexual activity: Not on file  Other Topics Concern   Not on file  Social History Narrative   Work or School: Engineer, agricultural, Tax inspector      Home Situation: lives alone      Spiritual Beliefs: Christian      Lifestyle: 15 minutes dialy walking - working up; working on diet            Social Determinants of Radio broadcast assistant Strain: Low Risk  (11/13/2021)   Overall Financial Resource Strain (CARDIA)    Difficulty of Paying Living Expenses: Not very hard  Food Insecurity: No Food Insecurity (11/13/2021)   Hunger Vital Sign    Worried About Running Out of Food in the Last Year: Never true    [place] in the Last Year: Never true  Transportation Needs: No Transportation Needs (11/13/2021)   PRAPARE - Hydrologist (Medical): No    Lack of Transportation (Non-Medical): No  Physical Activity: Sufficiently Active (11/24/2021)   Exercise Vital Sign    Days of Exercise per Week: 7 days    Minutes of Exercise per Session: 30 min  Stress: No Stress Concern Present (11/24/2021)   [place]    Feeling of Stress : Only a little  Social Connections: Moderately Integrated (11/24/2021)   Social Connection and Isolation Panel [NHANES]    Frequency of Communication with Friends and Family: More than three times a week    Frequency of Social Gatherings with Friends and Family: Three times a week    Attends Religious Services: More than 4 times per year    Active Member of Clubs or Organizations: Yes    Attends Archivist Meetings: More than 4 times per year    Marital Status: Never married  Intimate Partner Violence: Not At Risk (06/23/2019)   Humiliation, Afraid, Rape, and Kick questionnaire    Fear of Current or Ex-Partner: No    Emotionally Abused: No     Physically Abused: No    Sexually Abused: No     Review of Systems: All other systems reviewed and are otherwise negative except as noted above.  Physical Exam: There were no vitals filed for this visit.  GEN- The patient is well appearing, alert and oriented x 3 today.   HEENT: normocephalic, atraumatic; sclera clear, conjunctiva pink; hearing intact; oropharynx clear; neck supple, no JVP Lymph- no cervical lymphadenopathy Lungs- Clear to ausculation bilaterally, normal work of breathing.  No wheezes, rales, rhonchi Heart- Regular rate and rhythm, no murmurs, rubs or gallops, PMI not laterally displaced GI- soft, non-tender, non-distended, bowel sounds present, no hepatosplenomegaly Extremities- no clubbing, cyanosis, or edema; DP/PT/radial pulses 2+ bilaterally MS- no significant deformity or atrophy Skin- warm and  dry, no rash or lesion Psych- euthymic mood, full affect Neuro- strength and sensation are intact  EKG is ordered. Personal review of EKG from today shows NSR at 74 bpm  Additional studies reviewed include: Previous EP office notes.   Assessment and Plan:  1.  Persistent atrial fibrillation with RVR S/p [facility] 11/2021 Continue coreg Poor candidate for tikosyn given AKI and non-compliance Continue amiodarone 200 mg daily.    2. Chronic systolic CHF TTE 08/08/4825 LVEF 25-30% , EF <20% by TEE.  NICM based on prior work up, potential tachy mediated component, but had been down in the past prior to AF diagnosis.  GDMT as tolerated. Will need to be adjusted and EF re-assessed prior to ablation consideration Continue losartan 25 mg daily.   3. CKD III BMET today.    4. HTN Elevated on arrival today at 078 and 675 systolic.  Recheck at end of visit 138/90.  Coreg was just increased last week, so he declines further adjustment for now.   Follow up with Dr. Curt Bears in 4 months.   Shirley Friar, PA-C  12/18/21 12:13 PM

## 2021-12-11 NOTE — Progress Notes (Signed)
ADVANCED HF CLINIC NOTE  Primary Care: Maximiano Coss, NP HF Cardiologist: Dr. Aundra Dubin  HPI: Richard [name] is a 51 y.o.. male with a past medical history that includes type 2 diabetes, chronic combined systolic and diastolic heart failure, hypertension, chronic kidney disease stage IIla, H/o CVA, obesity and h/o poor compliance.   Echo 09/27/2015 EF 20-25%, RV mild dilated with moderately reduced systolic function, severe LAE.  Nonischemic cardiomyopathy, likely 2/2 poorly controlled HTN.  HIV negative, SPEP negative, no M spike. UPEP negative.  Had [place] in 2014, negative for CAD.  EF was 25-30% at that time. Improved on f/u echo in 2017. EF normalized 50-55%, GIIDD. He was being followed in the [facility] (Dr. Aundra Dubin) but lost to f/u. Last seen in 2018.    In 2020, he was admitted w/ Cryptogenic stroke. 2D echo w/ normal EF, 50-55%, normal RV.  However huge discrepancy in EF by TEE. EF felt to be 20-25%, RV normal. No LV thrombus. EP was consulted for Implantable loop recorder for afib surveillance, however pt declined.    He established care w/ Dr. Gasper Sells 6/22. Seen given complaints of LEE. BP was elevated. GDMT adjusted. Losartan switched to Entresto, spironolactone added. He was ordered to get repeat echo w/ plans to start [place] at subsequent visit, but he never followed up.   Admitted 6/23 w/ a/c CHF in the setting of new atrial fibrillation w/ RVR. Echo EF back down to 25-30%, RV normal. HS trop 466>>349. LHC not perused given renal function, SCr 2.5 on admit. He was diuresed w/ IV Lasix. Placed on Eliquis for Afib. TEE LVEF 15-20%, Aortic valve did not open with each ventricular contraction, RV systolic function severely reduced, Moderate tricuspid regurgitation, Trivial mitral regurgitation, No LA/LAA thrombus or masses. Underwent DCCV x 1 back to NSR.    Unfortunately he had ERAF. EP consulted and recommended loading w/ IV amio and reattempt DCCV after load. However pt refused to stay and  opted on repeat cardioversion as an outpatient. He was transitioned to PO amiodarone 400 mg bid x 1 week then 200 mg daily thereafter. Eliquis continued. Transitioned to PO lasix + GDMT (losartan, spiro, Farxiga and coreg). SCr 2.1 day of d/c. Repeat DCCV scheduled 11/30/21. D/c wt 314 lb.    Had outpatient f/u w/ EP on 11/21/21. Was still in Afib, 110 bpm. Amiodarone reduced to 200 mg daily w/ plans to c/w outpatient DCCV. Labs showed SCr up to 2.78. BUN 24. K 4.5. CO2 21. He was instructed to hold Lasix and spiro x 1 day and increase PO intake.    Seen in [facility] 6/23, NYHA II symptoms but volume up and SCr up to 2.8. Lasix stopped, torsemide 40 mg daily started. Arlyce Harman stopped with worsening renal function and low-dose BiDil added for afterload reduction.   S/p DCCV 11/30/21 to NSR.  Today he returns for HF follow up. Overall feeling better since cardioversion. He is able to walk on TM for 10 minutes without dyspnea. Denies palpitations, abnormal bleeding, CP, dizziness, edema, or PND/Orthopnea. Appetite ok. No fever or chills. Weight at home 290 pounds. Taking all medications. 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  ECG (personally reviewed): NSR 71 bpm, QTc 499 msec  Labs (6/23): K 3.7, creatinine 2.69, hgb 14.5   Cardiac Testing  - Echo (6/23): EF 25-30%, severe LV dysfunction with global HK, normal RV  ROS: All systems reviewed and negative except as per HPI.   Past Medical  History:  Diagnosis Date   Acute decompensated heart failure ([place]) 01/03/2013   Allergy    CHF (congestive heart failure) (HCC)    systolic   Chronic kidney disease    CKD (chronic kidney disease) stage 2, GFR 60-89 ml/min 01/04/2013   DM (diabetes mellitus) ([place]) 01/06/2013   Hypertension    hx htn emergency   Obesity    Stroke [facility])    Current Outpatient Medications  Medication Sig Dispense Refill   acetaminophen (TYLENOL) 500 MG tablet Take 1,000 mg by mouth every 6 (six) hours as  needed for moderate pain or headache.     amiodarone (PACERONE) 200 MG tablet Take 200 mg by mouth daily.     apixaban (ELIQUIS) 5 MG TABS tablet Take 1 tablet (5 mg total) by mouth 2 (two) times daily. 60 tablet 1   atorvastatin (LIPITOR) 40 MG tablet Take 1 tablet (40 mg total) by mouth daily. 90 tablet 0   carvedilol (COREG) 12.5 MG tablet Take 1 tablet (12.5 mg total) by mouth 2 (two) times daily with a meal. 60 tablet 1   dapagliflozin propanediol (FARXIGA) 10 MG TABS tablet Take 1 tablet (10 mg total) by mouth daily. 30 tablet 1   isosorbide-hydrALAZINE (BIDIL) 20-37.5 MG tablet Take 0.5 tablets by mouth 3 (three) times daily. 135 tablet 3   losartan (COZAAR) 25 MG tablet Take 1 tablet (25 mg total) by mouth at bedtime. 90 tablet 3   Multiple Vitamins-Minerals (MULTIVITAMIN GUMMIES ADULTS PO) Take 2 tablets by mouth daily.     Omega-3 Fatty Acids (FISH OIL) 1000 MG CAPS Take 1,000 mg by mouth daily.     potassium chloride SA (KLOR-CON M) 20 MEQ tablet Take 1 tablet (20 mEq total) by mouth 2 (two) times daily. 180 tablet 1   torsemide (DEMADEX) 20 MG tablet Take 2 tablets (40 mg total) by mouth daily. 180 tablet 3   No current facility-administered medications for this encounter.   No Known Allergies  Social History   Socioeconomic History   Marital status: Single    Spouse name: Not on file   Number of children: 0   Years of education: Not on file   Highest education level: Bachelor's degree (e.g., BA, AB, BS)  Occupational History   Occupation: Production designer, theatre/television/film  Tobacco Use   Smoking status: Never   Smokeless tobacco: Never  Vaping Use   Vaping Use: Never used  Substance and Sexual Activity   Alcohol use: No   Drug use: No   Sexual activity: Not on file  Other Topics Concern   Not on file  Social History Narrative   Work or School: Engineer, agricultural, Tax inspector      Home Situation: lives alone      Spiritual Beliefs: Christian       Lifestyle: 15 minutes dialy walking - working up; working on diet            Social Determinants of Radio broadcast assistant Strain: Low Risk  (11/13/2021)   Overall Financial Resource Strain (CARDIA)    Difficulty of Paying Living Expenses: Not very hard  Food Insecurity: No Food Insecurity (11/13/2021)   Hunger Vital Sign    Worried About Running Out of Food in the Last Year: Never true    Ran Out of Food in the Last Year: Never true  Transportation Needs: No Transportation Needs (11/13/2021)   PRAPARE - Hydrologist (Medical): No  Lack of Transportation (Non-Medical): No  Physical Activity: Sufficiently Active (11/24/2021)   Exercise Vital Sign    Days of Exercise per Week: 7 days    Minutes of Exercise per Session: 30 min  Stress: No Stress Concern Present (11/24/2021)   [place]    Feeling of Stress : Only a little  Social Connections: Moderately Integrated (11/24/2021)   Social Connection and Isolation Panel [NHANES]    Frequency of Communication with Friends and Family: More than three times a week    Frequency of Social Gatherings with Friends and Family: Three times a week    Attends Religious Services: More than 4 times per year    Active Member of Clubs or Organizations: Yes    Attends Archivist Meetings: More than 4 times per year    Marital Status: Never married  Intimate Partner Violence: Not At Risk (06/23/2019)   Humiliation, Afraid, Rape, and Kick questionnaire    Fear of Current or Ex-Partner: No    Emotionally Abused: No    Physically Abused: No    Sexually Abused: No   Family History  Problem Relation Age of Onset   Heart disease Father 31       MI   Hypertension Father    Hyperlipidemia Father    Diabetes Mother    Hypertension Mother    BP (!) 148/104   Pulse 78   Wt (!) 137.7 kg (303 lb 9.6 oz)   SpO2 97%   BMI 42.34 kg/m   Wt Readings  from Last 3 Encounters:  12/11/21 (!) 137.7 kg (303 lb 9.6 oz)  11/28/21 (!) 138.7 kg (305 lb 12.8 oz)  11/24/21 (!) 143.2 kg (315 lb 12.8 oz)   PHYSICAL EXAM: General:  NAD. No resp difficulty HEENT: Normal Neck: Supple. No JVD. Carotids 2+ bilat; no bruits. No lymphadenopathy or thryomegaly appreciated. Cor: PMI nondisplaced. Regular rate & rhythm. No rubs, gallops or murmurs. Lungs: Clear Abdomen: Obese, soft, nontender, nondistended. No hepatosplenomegaly. No bruits or masses. Good bowel sounds. Extremities: No cyanosis, clubbing, rash, edema Neuro: Alert & oriented x 3, cranial nerves grossly intact. Moves all 4 extremities w/o difficulty. Affect pleasant.  ASSESSMENT & PLAN: 1. Chronic Systolic Heart Failure - NICM, likely 2/2 poorly controlled HTN + tachymediated from rapid Afib . - Echo (2014): EF 25-30%, LHC no CAD  - Echo (4/17): EF 20-25%, RV mild dilated with moderately reduced systolic function, severe LAE - SPEP negative, no M spike. UPEP negative in 2017  - Echo (10/17): EF normalized, 50-55% - Echo (12/20): EF 50-55% - Echo (6/23): EF 25-30%, global HK, RV normal. In setting of new Afib w/ RVR, uncontrolled hypertension and poor compliance w/ meds  - Not current candidate for LHC given CKD w/ b/l SCr >2 - NYHA II. He is not volume overloaded on exam, weight down 2 lbs.  - Increase carvedilol to 18.75 mg bid. - Increase BiDil to 1 tablet tid. - Continue losartan 25 mg daily. BMET/BNP today. May need to cut back if renal function worse. - Continue Farxiga 10 mg daily. - Continue torsemide 40 mg daily.  - Off spiro with elevated SCr 2.8. - No digoxin w/ CKD. - Repeat echo next visit.   2. Persistent Atrial Fibrillation  - Diagnosed 6/23 - s/p TEE/DCCV to NSR w/ ERAF - s/p DCCV 11/30/21 to NSR. - NSR on ECG today. - Continue amiodarone 200 mg daily.  - Continue Eliquis 5 mg  bid. No bleeding issues.  - Continue beta blocker.   - Followed in [facility]. Needs to be  considered for Afib ablation. LA diameter 4.40 cm - ? Underlying OSA. He has home sleep study, needs to complete this.   3. Hypertension  - Poorly controlled. Elevated today. - Increase meds as above. - Continue to check BP at home and log. - Sleep study as above.   4. CKD Stage IIIb - IV - Suspect hypertensive/ DM nephropathy  - Baseline SCr 2.1-2.5   - Continue Farxiga 10 mg daily.  - He has been referred to [facility].    5. Type 2 DM  - Recent Hgb A1c 6.4.  - Continue SGLT2i.   6. H/o CVA  - Cryptogenetic (2020). Refused loop recorder. - ? If cardioembolic, given recent afib detection. - Now on Eliquis 5 mg bid     Follow up in 2 months with Dr. Aundra Dubin + echo.  Allena Katz, FNP-BC 12/11/21

## 2021-12-13 NOTE — Progress Notes (Deleted)
Cardiology Office Note:    Date:  12/13/2021   ID:  Richard [name], DOB 01/27/1971, MRN [id number]  PCP:  Maximiano Coss, NP  [facility] Cardiologist:  Werner Lean, MD  [facility] Electrophysiologist:  Will Meredith Leeds, MD   Chief Complaint:   History of Present Illness:    Richard [name] is a 51 y.o. male with a hx of ***  Past Medical History:  Diagnosis Date   Acute decompensated heart failure ([place]) 01/03/2013   Allergy    CHF (congestive heart failure) ([place])    systolic   Chronic kidney disease    CKD (chronic kidney disease) stage 2, GFR 60-89 ml/min 01/04/2013   DM (diabetes mellitus) ([place]) 01/06/2013   Hypertension    hx htn emergency   Obesity    Stroke [facility])     Past Surgical History:  Procedure Laterality Date   BUBBLE STUDY  05/22/2019   Procedure: BUBBLE STUDY;  Surgeon: Skeet Latch, MD;  Location: [place];  Service: Cardiovascular;;   CARDIOVERSION N/A 11/13/2021   Procedure: CARDIOVERSION;  Surgeon: Skeet Latch, MD;  Location: [place];  Service: Cardiovascular;  Laterality: N/A;   CARDIOVERSION N/A 11/30/2021   Procedure: CARDIOVERSION;  Surgeon: Josue Hector, MD;  Location: [place];  Service: Cardiovascular;  Laterality: N/A;   LEFT AND RIGHT HEART CATHETERIZATION WITH CORONARY ANGIOGRAM N/A 01/07/2013   Procedure: LEFT AND RIGHT HEART CATHETERIZATION WITH CORONARY ANGIOGRAM;  Surgeon: Birdie Riddle, MD;  Location: [place] CATH LAB;  Service: Cardiovascular;  Laterality: N/A;   TEE WITHOUT CARDIOVERSION N/A 05/22/2019   Procedure: TRANSESOPHAGEAL ECHOCARDIOGRAM (TEE);  Surgeon: Skeet Latch, MD;  Location: [place];  Service: Cardiovascular;  Laterality: N/A;   TEE WITHOUT CARDIOVERSION N/A 11/13/2021   Procedure: TRANSESOPHAGEAL ECHOCARDIOGRAM (TEE);  Surgeon: Skeet Latch, MD;  Location: [facility] ENDOSCOPY;  Service: Cardiovascular;  Laterality: N/A;    Current Medications: No outpatient medications have  been marked as taking for the 12/14/21 encounter (Appointment) with Leanor Kail, [place].     Allergies:   Patient has no known allergies.   Social History   Socioeconomic History   Marital status: Single    Spouse name: Not on file   Number of children: 0   Years of education: Not on file   Highest education level: Bachelor's degree (e.g., BA, AB, BS)  Occupational History   Occupation: Production designer, theatre/television/film  Tobacco Use   Smoking status: Never   Smokeless tobacco: Never  Vaping Use   Vaping Use: Never used  Substance and Sexual Activity   Alcohol use: No   Drug use: No   Sexual activity: Not on file  Other Topics Concern   Not on file  Social History Narrative   Work or School: Engineer, agricultural, Tax inspector      Home Situation: lives alone      Spiritual Beliefs: Christian      Lifestyle: 15 minutes dialy walking - working up; working on diet            Social Determinants of Radio broadcast assistant Strain: Low Risk  (11/13/2021)   Overall Financial Resource Strain (CARDIA)    Difficulty of Paying Living Expenses: Not very hard  Food Insecurity: No Food Insecurity (11/13/2021)   Hunger Vital Sign    Worried About Running Out of Food in the Last Year: Never true    Ran Out of Food in the Last Year: Never true  Transportation Needs: No Transportation Needs (11/13/2021)  PRAPARE - Hydrologist (Medical): No    Lack of Transportation (Non-Medical): No  Physical Activity: Sufficiently Active (11/24/2021)   Exercise Vital Sign    Days of Exercise per Week: 7 days    Minutes of Exercise per Session: 30 min  Stress: No Stress Concern Present (11/24/2021)   [place]    Feeling of Stress : Only a little  Social Connections: Moderately Integrated (11/24/2021)   Social Connection and Isolation Panel [NHANES]    Frequency of Communication with Friends  and Family: More than three times a week    Frequency of Social Gatherings with Friends and Family: Three times a week    Attends Religious Services: More than 4 times per year    Active Member of Clubs or Organizations: Yes    Attends Music therapist: More than 4 times per year    Marital Status: Never married     Family History: The patient's family history includes Diabetes in his mother; Heart disease (age of onset: 26) in his father; Hyperlipidemia in his father; Hypertension in his father and mother.  ***  ROS:   Please see the history of present illness.    All other systems reviewed and are negative. ***  EKGs/Labs/Other Studies Reviewed:    The following studies were reviewed today: ***  EKG:  EKG is *** ordered today.  The ekg ordered today demonstrates ***  Recent Labs: 11/09/2021: ALT 53; Magnesium 1.9; TSH 1.293 11/21/2021: Hemoglobin 14.5; Platelets 235 11/28/2021: B Natriuretic Peptide 696.5 12/11/2021: BUN 28; Creatinine, Ser 2.03; Potassium 5.2; Sodium 141  Recent Lipid Panel    Component Value Date/Time   CHOL 149 06/10/2019 1014   TRIG 63 06/10/2019 1014   HDL 43 06/10/2019 1014   CHOLHDL 3.5 06/10/2019 1014   CHOLHDL 5.3 05/20/2019 0800   VLDL 21 05/20/2019 0800   LDLCALC 93 06/10/2019 1014     Risk Assessment/Calculations:   {Does this patient have ATRIAL FIBRILLATION?:[id number]}   Physical Exam:    VS:  There were no vitals taken for this visit.    Wt Readings from Last 3 Encounters:  12/11/21 (!) 303 lb 9.6 oz (137.7 kg)  11/28/21 (!) 305 lb 12.8 oz (138.7 kg)  11/24/21 (!) 315 lb 12.8 oz (143.2 kg)     GEN: *** Well nourished, well developed in no acute distress HEENT: Normal NECK: No JVD; No carotid bruits LYMPHATICS: No lymphadenopathy CARDIAC: ***RRR, no murmurs, rubs, gallops RESPIRATORY:  Clear to auscultation without rales, wheezing or rhonchi  ABDOMEN: Soft, non-tender, non-distended MUSCULOSKELETAL:  No edema; No  deformity  SKIN: Warm and dry NEUROLOGIC:  Alert and oriented x 3 PSYCHIATRIC:  Normal affect   ASSESSMENT AND PLAN:    ***  2. ***  Medication Adjustments/Labs and Tests Ordered: Current medicines are reviewed at length with the patient today.  Concerns regarding medicines are outlined above.  No orders of the defined types were placed in this encounter.  No orders of the defined types were placed in this encounter.   There are no Patient Instructions on file for this visit.   Jarrett Soho, [place]  12/13/2021 1:11 PM    [facility]

## 2021-12-14 ENCOUNTER — Ambulatory Visit: Payer: 59 | Admitting: Physician Assistant

## 2021-12-18 ENCOUNTER — Ambulatory Visit (INDEPENDENT_AMBULATORY_CARE_PROVIDER_SITE_OTHER): Payer: 59 | Admitting: Student

## 2021-12-18 ENCOUNTER — Encounter: Payer: Self-pay | Admitting: Student

## 2021-12-18 VITALS — BP 138/90 | HR 70 | Wt 304.0 lb

## 2021-12-18 DIAGNOSIS — I10 Essential (primary) hypertension: Secondary | ICD-10-CM

## 2021-12-18 DIAGNOSIS — I5020 Unspecified systolic (congestive) heart failure: Secondary | ICD-10-CM

## 2021-12-18 DIAGNOSIS — I4819 Other persistent atrial fibrillation: Secondary | ICD-10-CM

## 2021-12-18 LAB — BASIC METABOLIC PANEL
BUN/Creatinine Ratio: 12 (ref 9–20)
BUN: 23 mg/dL (ref 6–24)
CO2: 25 mmol/L (ref 20–29)
Calcium: 8.9 mg/dL (ref 8.7–10.2)
Chloride: 103 mmol/L (ref 96–106)
Creatinine, Ser: 1.95 mg/dL — ABNORMAL HIGH (ref 0.76–1.27)
Glucose: 173 mg/dL — ABNORMAL HIGH (ref 70–99)
Potassium: 3.6 mmol/L (ref 3.5–5.2)
Sodium: 145 mmol/L — ABNORMAL HIGH (ref 134–144)
eGFR: 41 mL/min/{1.73_m2} — ABNORMAL LOW (ref >59)

## 2021-12-18 NOTE — Patient Instructions (Signed)
Medication Instructions:  Your physician recommends that you continue on your current medications as directed. Please refer to the Current Medication list given to you today.  *If you need a refill on your cardiac medications before your next appointment, please call your pharmacy*   Lab Work: TODAY: BMET  If you have labs (blood work) drawn today and your tests are completely normal, you will receive your results only by: [place] (if you have MyChart) OR A paper copy in the mail If you have any lab test that is abnormal or we need to change your treatment, we will call you to review the results.   Follow-Up: At [facility], you and your health needs are our priority.  As part of our continuing mission to provide you with exceptional heart care, we have created designated Provider Care Teams.  These Care Teams include your primary Cardiologist (physician) and Advanced Practice Providers (APPs -  Physician Assistants and Nurse Practitioners) who all work together to provide you with the care you need, when you need it.   Your next appointment:   4 month(s)  The format for your next appointment:   In Person  Provider:   Allegra Lai, MD{

## 2021-12-19 ENCOUNTER — Encounter: Payer: Self-pay | Admitting: Physician Assistant

## 2021-12-19 DIAGNOSIS — I2729 Other secondary pulmonary hypertension: Secondary | ICD-10-CM

## 2021-12-19 HISTORY — DX: Other secondary pulmonary hypertension: I27.29

## 2021-12-19 NOTE — Progress Notes (Deleted)
Cardiology Office Note:    Date:  12/19/2021   ID:  Richard [name], DOB Oct 13, 1970, MRN [id number]  PCP:  Maximiano Coss, NP  [facility] Cardiologist:  Werner Lean, MD Electrophysiologist:  Will Meredith Leeds, MD { Click to update primary MD,subspecialty MD or APP then REFRESH:1}  *** Referring MD: Maximiano Coss, NP   Chief Complaint:  No chief complaint on file. {Click here for Visit Info    :1}   Patient Profile: Persistent atrial fibrillation  S/p TEE-DCCV 11/2021>>ERAF + Amiodarone >> S/p DCCV 11/30/21 (HFrEF) heart failure with reduced ejection fraction  Non-ischemic cardiomyopathy  EF improved in past to 50-55 in 2017 Echocardiogram 11/2021: EF 25-30 // TEE 11/2021: EF < 20 Hx of CVA  Diabetes mellitus  Chronic kidney disease stage III Pulmonary hypertension   Prior CV Studies: {Select studies to display:26339}  TEE 11/13/21 Severely reduced BiV function, EF < 20, global HK, severely reduced RVSF, trivial MR  ECHOCARDIOGRAM 11/09/21 EF 25-30, global HK, normal RVSF, mod elevated PASP, mild LAE, trivial MR  CARDIAC CATHETERIZATION 01/07/13 Normal coronary arteries, EF 45  TEE 05/22/19 EF 20-25  ECHOCARDIOGRAM 05/20/19 EF 50-55  ECHOCARDIOGRAM 03/15/16 EF 50-55  ECHOCARDIOGRAM 09/27/15 EF 20-25  History of Present Illness:   Richard [name] is a 51 y.o. male with the above problem list.  He established with Dr. Gasper Sells in June 2022 but has not been seen back in clinic since.        He was admitted 6/8-6/14 with decompensated heart failure in the setting of AFib with RVR. He was started on Eliquis for anticoagulation. EF was noted to be lower again on echocardiogram at 25-30. It was felt the drop in EF may be due to uncontrolled BP vs tachycardia. He would need a repeat echocardiogram 3 mos after DC and, if EF remains down, an ischemia evaluation. Diuresis was c/b AKI on chronic kidney disease. He underwent TEE-DCCV but had return  of AFib. He was placed on Amiodarone prior to DC. He underwent DCCV 11/30/21 with return of NSR. Plan is to follow up with Dr. Quentin Ore with EP to consider PVI ablation at some point to avoid long term Amiodarone Rx. He has also been seen in the CHF clinic for titration of GDMT. He was last seen by Oda Kilts, PA-C with EP on 12/18/21. Plan is to f/u with Dr. Quentin Ore in 4 mos.   He returns for f/u. ***    Past Medical History:  Diagnosis Date   Acute decompensated heart failure ([place]) 01/03/2013   Allergy    CHF (congestive heart failure) ([facility])    systolic   Chronic kidney disease    CKD (chronic kidney disease) stage 2, GFR 60-89 ml/min 01/04/2013   DM (diabetes mellitus) ([place]) 01/06/2013   Hypertension    hx htn emergency   Obesity    Stroke [facility])    Current Medications: No outpatient medications have been marked as taking for the 12/20/21 encounter (Appointment) with Richardson Dopp T, PA-C.    Allergies:   Patient has no known allergies.   Social History   Tobacco Use   Smoking status: Never   Smokeless tobacco: Never  Vaping Use   Vaping Use: Never used  Substance Use Topics   Alcohol use: No   Drug use: No    Family Hx: The patient's family history includes Diabetes in his mother; Heart disease (age of onset: 41) in his father; Hyperlipidemia in his father; Hypertension in his father and mother.  ROS   EKGs/Labs/Other Test Reviewed:    EKG:  EKG is *** ordered today.  The ekg ordered today demonstrates ***  Recent Labs: 11/09/2021: ALT 53; Magnesium 1.9; TSH 1.293 11/21/2021: Hemoglobin 14.5; Platelets 235 11/28/2021: B Natriuretic Peptide 696.5 12/18/2021: BUN 23; Creatinine, Ser 1.95; Potassium 3.6; Sodium 145   Recent Lipid Panel No results for input(s): "CHOL", "TRIG", "HDL", "VLDL", "LDLCALC", "LDLDIRECT" in the last 8760 hours.   Risk Assessment/Calculations/Metrics:   {Does this patient have ATRIAL FIBRILLATION?:[id number]}          Physical Exam:    VS:   There were no vitals taken for this visit.    Wt Readings from Last 3 Encounters:  12/18/21 (!) 304 lb (137.9 kg)  12/11/21 (!) 303 lb 9.6 oz (137.7 kg)  11/28/21 (!) 305 lb 12.8 oz (138.7 kg)    Physical Exam ***     ASSESSMENT & PLAN:   No problem-specific Assessment & Plan notes found for this encounter.        {Are you ordering a CV Procedure (e.g. stress test, cath, DCCV, TEE, etc)?   Press F2        :[id number]}   Dispo:  No follow-ups on file.   Medication Adjustments/Labs and Tests Ordered: Current medicines are reviewed at length with the patient today.  Concerns regarding medicines are outlined above.  Tests Ordered: No orders of the defined types were placed in this encounter.  Medication Changes: No orders of the defined types were placed in this encounter.  Signed, Richardson Dopp, PA-C  12/19/2021 10:14 PM    [address], [place], [place]  [address] Phone: [phone]; Fax: [phone]

## 2021-12-20 ENCOUNTER — Ambulatory Visit: Payer: 59 | Admitting: Physician Assistant

## 2021-12-20 DIAGNOSIS — I5020 Unspecified systolic (congestive) heart failure: Secondary | ICD-10-CM

## 2021-12-20 DIAGNOSIS — Z8673 Personal history of transient ischemic attack (TIA), and cerebral infarction without residual deficits: Secondary | ICD-10-CM

## 2021-12-20 DIAGNOSIS — N1832 Chronic kidney disease, stage 3b: Secondary | ICD-10-CM

## 2021-12-20 DIAGNOSIS — I2729 Other secondary pulmonary hypertension: Secondary | ICD-10-CM

## 2021-12-20 DIAGNOSIS — I4819 Other persistent atrial fibrillation: Secondary | ICD-10-CM

## 2021-12-20 DIAGNOSIS — I10 Essential (primary) hypertension: Secondary | ICD-10-CM

## 2021-12-22 ENCOUNTER — Encounter: Payer: Self-pay | Admitting: Podiatry

## 2021-12-22 ENCOUNTER — Ambulatory Visit (INDEPENDENT_AMBULATORY_CARE_PROVIDER_SITE_OTHER): Payer: 59 | Admitting: Podiatry

## 2021-12-22 DIAGNOSIS — M722 Plantar fascial fibromatosis: Secondary | ICD-10-CM | POA: Diagnosis not present

## 2021-12-22 DIAGNOSIS — M7662 Achilles tendinitis, left leg: Secondary | ICD-10-CM

## 2021-12-22 MED ORDER — TRIAMCINOLONE ACETONIDE 10 MG/ML IJ SUSP
10.0000 mg | Freq: Once | INTRAMUSCULAR | Status: AC
  Administered 2021-12-22: 10 mg

## 2021-12-25 NOTE — Progress Notes (Signed)
Subjective:   Patient ID: Richard [name], male   DOB: 51 y.o.   MRN: [id number]   HPI Patient presents stating he is slowly getting better but still having some pain in his heel   ROS      Objective:  Physical Exam  Neurovascular status intact with continued discomfort plantar heel left at the insertional point of the tendon calcaneus     Assessment:  Acute plantar fasciitis left improved but still present     Plan:  Sterile prep did final injection of the fascia today 3 mg Kenalog 5 mg Xylocaine discussed reduced activity and support shoes and physical therapy and if symptoms do not improve we will have to be more aggressive in trying to solve this.

## 2022-01-03 ENCOUNTER — Ambulatory Visit: Payer: 59 | Admitting: Physician Assistant

## 2022-01-07 ENCOUNTER — Other Ambulatory Visit: Payer: Self-pay

## 2022-01-07 ENCOUNTER — Encounter (HOSPITAL_COMMUNITY): Payer: Self-pay

## 2022-01-07 ENCOUNTER — Emergency Department (HOSPITAL_COMMUNITY)
Admission: EM | Admit: 2022-01-07 | Discharge: 2022-01-07 | Disposition: A | Discharge status: Home / Self Care | Payer: 59 | Attending: Emergency Medicine | Admitting: Emergency Medicine

## 2022-01-07 DIAGNOSIS — Z7984 Long term (current) use of oral hypoglycemic drugs: Secondary | ICD-10-CM | POA: Insufficient documentation

## 2022-01-07 DIAGNOSIS — I130 Hypertensive heart and chronic kidney disease with heart failure and stage 1 through stage 4 chronic kidney disease, or unspecified chronic kidney disease: Secondary | ICD-10-CM | POA: Insufficient documentation

## 2022-01-07 DIAGNOSIS — Z7901 Long term (current) use of anticoagulants: Secondary | ICD-10-CM | POA: Insufficient documentation

## 2022-01-07 DIAGNOSIS — I5020 Unspecified systolic (congestive) heart failure: Secondary | ICD-10-CM | POA: Diagnosis not present

## 2022-01-07 DIAGNOSIS — N183 Chronic kidney disease, stage 3 unspecified: Secondary | ICD-10-CM | POA: Insufficient documentation

## 2022-01-07 DIAGNOSIS — Z79899 Other long term (current) drug therapy: Secondary | ICD-10-CM | POA: Diagnosis not present

## 2022-01-07 DIAGNOSIS — M79672 Pain in left foot: Secondary | ICD-10-CM | POA: Diagnosis present

## 2022-01-07 DIAGNOSIS — E1122 Type 2 diabetes mellitus with diabetic chronic kidney disease: Secondary | ICD-10-CM | POA: Diagnosis not present

## 2022-01-07 DIAGNOSIS — M722 Plantar fascial fibromatosis: Secondary | ICD-10-CM | POA: Diagnosis not present

## 2022-01-07 MED ORDER — KETOROLAC TROMETHAMINE 30 MG/ML IJ SOLN
30.0000 mg | Freq: Once | INTRAMUSCULAR | Status: AC
  Administered 2022-01-07: 30 mg via INTRAMUSCULAR
  Filled 2022-01-07: qty 1

## 2022-01-07 NOTE — ED Notes (Signed)
Pt's BP was 183/117 upon discharge. PA made aware. Okay to dc.

## 2022-01-07 NOTE — ED Provider Notes (Signed)
[facility] EMERGENCY DEPARTMENT Provider Note   CSN: [id number] Arrival date & time: 01/07/22  0930     History PMH: HTN, CKD stage 3, HFrEF, Hx CVA, Atrial Fibrillation, DM 2, obesity, plantar fasciitis  Chief Complaint  Patient presents with   Foot Pain    Richard [name] is a 51 y.o. male. He presents with left foot pain.  He states he has a history of plantar fasciitis and has been followed with a podiatrist.  He recently had a local steroid injection in his left foot which did help a lot.  He says that over the past 3 days the pain is returned though.  He says he is having difficulty walking.  He has been trying ice packs without any relief.  He has not tried any medications.  His follow-up appointment is on August 23.  He came here due to not being able to control the pain.   Foot Pain       Home Medications Prior to Admission medications   Medication Sig Start Date End Date Taking? Authorizing Provider  acetaminophen (TYLENOL) 500 MG tablet Take 1,000 mg by mouth every 6 (six) hours as needed for moderate pain or headache.    [provider]  amiodarone (PACERONE) 400 MG tablet Take by mouth. 12/18/21   [provider]  apixaban (ELIQUIS) 5 MG TABS tablet Take 1 tablet (5 mg total) by mouth 2 (two) times daily. 11/15/21   Samella Parr, NP  atorvastatin (LIPITOR) 40 MG tablet Take 1 tablet (40 mg total) by mouth daily. 11/15/21   Samella Parr, NP  carvedilol (COREG) 12.5 MG tablet Take 1.5 tablets (18.75 mg total) by mouth 2 (two) times daily with a meal. 12/11/21   [place], Maricela Bo, FNP  dapagliflozin propanediol (FARXIGA) 10 MG TABS tablet Take 1 tablet (10 mg total) by mouth daily. 11/16/21   Samella Parr, NP  isosorbide-hydrALAZINE (BIDIL) 20-37.5 MG tablet Take 1 tablet by mouth 3 (three) times daily. 12/11/21   [place], Maricela Bo, FNP  losartan (COZAAR) 25 MG tablet Take 1 tablet (25 mg total) by mouth at bedtime. 11/21/21    Shirley Friar, PA-C  Multiple Vitamins-Minerals (MULTIVITAMIN GUMMIES ADULTS PO) Take 2 tablets by mouth daily.    [provider]  Omega-3 Fatty Acids (FISH OIL) 1000 MG CAPS Take 1,000 mg by mouth daily.    [provider]  potassium chloride SA (KLOR-CON M) 20 MEQ tablet Take 1 tablet (20 mEq total) by mouth 2 (two) times daily. 05/02/21   Camnitz, Ocie Doyne, MD  torsemide (DEMADEX) 20 MG tablet Take 2 tablets (40 mg total) by mouth daily. 11/24/21   Consuelo Pandy, PA-C      Allergies    Patient has no known allergies.    Review of Systems   Review of Systems  Musculoskeletal:        Foot pain  All other systems reviewed and are negative.   Physical Exam Updated Vital Signs BP (!) 185/117 (BP Location: Right Wrist)   Pulse 68   Temp 97.6 F (36.4 C) (Oral)   Resp 18   Ht 5\' 11"  (1.803 m)   Wt (!) 137.9 kg   SpO2 99%   BMI 42.40 kg/m  Physical Exam Vitals and nursing note reviewed.  Constitutional:      General: He is not in acute distress.    Appearance: Normal appearance. He is well-developed. He is not ill-appearing, toxic-appearing or diaphoretic.  HENT:     Head: Normocephalic and atraumatic.     Nose: No nasal deformity.     Mouth/Throat:     Lips: Pink. No lesions.  Eyes:     General: Gaze aligned appropriately. No scleral icterus.       Right eye: No discharge.        Left eye: No discharge.     Conjunctiva/sclera: Conjunctivae normal.     Right eye: Right conjunctiva is not injected. No exudate or hemorrhage.    Left eye: Left conjunctiva is not injected. No exudate or hemorrhage. Pulmonary:     Effort: Pulmonary effort is normal. No respiratory distress.  Musculoskeletal:     Comments: Left foot has point tenderness to the bottom of the heel.  He has pain when stretching the fascia.  Fascia is very tight.  There are no open wounds, erythema, or swelling noted.  He has 2+ radial pulse and normal sensation.  Skin:     General: Skin is warm and dry.  Neurological:     Mental Status: He is alert and oriented to person, place, and time.  Psychiatric:        Mood and Affect: Mood normal.        Speech: Speech normal.        Behavior: Behavior normal. Behavior is cooperative.     ED Results / Procedures / Treatments   Labs (all labs ordered are listed, but only abnormal results are displayed) Labs Reviewed - No data to display  EKG None  Radiology No results found.  Procedures Procedures   Medications Ordered in ED Medications  ketorolac (TORADOL) 30 MG/ML injection 30 mg (has no administration in time range)    ED Course/ Medical Decision Making/ A&P                           Medical Decision Making Risk Prescription drug management.   Patient is here with continued pain for plantar fasciitis. He is already followed by podiatry and has a follow-up appointment. Have given him a Toradol injection here for symptoms.  I have recommended supportive exercises at home to try. There were no signs of other etiology causing symptoms and this is consistent with prior flares of plantar fasciitis  I discussed calling podiatrist for earlier follow up appointment.   Final Clinical Impression(s) / ED Diagnoses Final diagnoses:  Plantar fasciitis of left foot    Rx / DC Orders ED Discharge Orders     None         Adolphus Birchwood, PA-C 01/07/22 1116    Regan Lemming, MD 01/07/22 1340

## 2022-01-07 NOTE — ED Triage Notes (Signed)
Dx with plantar fasciitis in left foot and had steroid shot but it has worn off.  Has appt with podiatrist on 21st of aug but cant take the pain.

## 2022-01-07 NOTE — Discharge Instructions (Signed)
Please review handouts on management at home of plantar fasciitis. You can take tylenol at home for pain. I recommend icing the area. Gentle stretching. Please call your podiatrist for follow up visit.

## 2022-01-13 ENCOUNTER — Other Ambulatory Visit: Payer: Self-pay | Admitting: Cardiology

## 2022-01-16 ENCOUNTER — Telehealth (HOSPITAL_COMMUNITY): Payer: Self-pay | Admitting: Surgery

## 2022-01-16 NOTE — Telephone Encounter (Signed)
I called patient to remind him to complete ordered home sleep study.  I left a message to request that he proceed within the next week to complete the study or return the device to our clinic.

## 2022-01-22 ENCOUNTER — Telehealth (HOSPITAL_COMMUNITY): Payer: Self-pay | Admitting: Surgery

## 2022-01-22 ENCOUNTER — Ambulatory Visit (INDEPENDENT_AMBULATORY_CARE_PROVIDER_SITE_OTHER): Payer: 59 | Admitting: Podiatry

## 2022-01-22 ENCOUNTER — Encounter: Payer: Self-pay | Admitting: Podiatry

## 2022-01-22 ENCOUNTER — Encounter (INDEPENDENT_AMBULATORY_CARE_PROVIDER_SITE_OTHER): Payer: 59 | Admitting: Cardiology

## 2022-01-22 DIAGNOSIS — M722 Plantar fascial fibromatosis: Secondary | ICD-10-CM

## 2022-01-22 DIAGNOSIS — G4733 Obstructive sleep apnea (adult) (pediatric): Secondary | ICD-10-CM

## 2022-01-22 NOTE — Progress Notes (Signed)
Subjective:   Patient ID: Richard [name], male   DOB: 51 y.o.   MRN: [id number]   HPI Patient states he is improving but still having open good amount of pain after periods of sitting and when he gets up in the morning.  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 and that has caused him more trouble and he wants to start exercising again   ROS      Objective:  Physical Exam  Neuro vascular status intact with discomfort in the plantar heel left at the insertional point of the tendon and to the calcaneus with patient found to have problems with stretching and excessive weight which put stress on the tendon     Assessment:  Chronic fasciitis-like symptomatology left with moderate to significant obesity which she is trying to work on     Plan:  Age NP reviewed condition discussed and I have recommended night splint with ice therapy especially after work and to sleep it.  I explained the utilization of this patient wants a night splint and is placed in a night splint with all instructions given and fitted properly and also ice packs were dispensed.  Reappoint if symptoms persist

## 2022-01-22 NOTE — Telephone Encounter (Signed)
Richard [name] called the office concerned that he had completed his ordered home sleep study and was indicated not "sufficient data" on his phone App.  He asked if he could perform the study again.  I have left a new sleep device at the front desk in [facility] for him to pick up to perform the study again.

## 2022-01-24 ENCOUNTER — Ambulatory Visit: Payer: 59 | Attending: Family Medicine

## 2022-01-24 DIAGNOSIS — I5020 Unspecified systolic (congestive) heart failure: Secondary | ICD-10-CM

## 2022-01-24 NOTE — Procedures (Signed)
   SLEEP STUDY REPORT Patient Information Study Date: 01/22/22 Patient Name: Richard [name] Patient ID: [id number] Birth Date: [birth year] Age: 51 Gender: Male Referring Physician: Allena Katz, NP  TEST DESCRIPTION: Home sleep apnea testing was completed using the WatchPat, a Type 1 device, utilizing  peripheral arterial tonometry (PAT), chest movement, actigraphy, pulse oximetry, pulse rate, body position and snore.  AHI was calculated with apnea and hypopnea using valid sleep time as the denominator. RDI includes apneas,  hypopneas, and RERAs. The data acquired and the scoring of sleep and all associated events were performed in  accordance with the recommended standards and specifications as outlined in the AASM Manual for the Scoring of  Sleep and Associated Events 2.2.0 (2015).  FINDINGS: 1. Moderate Obstructive Sleep Apnea with AHI 22/hr.  2. No Central Sleep Apnea with pAHIc 0/hr. 3. Oxygen desaturations as low as 83%. 4. Minimal snoring was present. O2 sats were < 88% for 1.5 min. 5. Total sleep time was 2 hrs and 21 min. 6. 0% of total sleep time was spent in REM sleep.  7. Shortened sleep onset latency at 5 min 8. No REM sleep present. 9. Total awakenings were 3.   DIAGNOSIS:  Moderate Obstructive Sleep Apnea (G47.33)  RECOMMENDATIONS: 1. Clinical correlation of these findings is necessary. The decision to treat obstructive sleep apnea (OSA) is usually  based on the presence of apnea symptoms or the presence of associated medical conditions such as Hypertension,  Congestive Heart Failure, Atrial Fibrillation or Obesity. The most common symptoms of OSA are snoring, gasping for  breath while sleeping, daytime sleepiness and fatigue.   2. Initiating apnea therapy is recommended given the presence of symptoms and/or associated conditions.  Recommend proceeding with one of the following:   a. Auto-CPAP therapy with a pressure range of 5-20cm H2O.   b. An oral appliance  (OA) that can be obtained from certain dentists with expertise in sleep medicine. These are  primarily of use in non-obese patients with mild and moderate disease.   c. An ENT consultation which may be useful to look for specific causes of obstruction and possible treatment  options.   d. If patient is intolerant to PAP therapy, consider referral to ENT for evaluation for hypoglossal nerve stimulator.   3. Close follow-up is necessary to ensure success with CPAP or oral appliance therapy for maximum benefit .  4. A follow-up oximetry study on CPAP is recommended to assess the adequacy of therapy and determine the need  for supplemental oxygen or the potential need for Bi-level therapy. An arterial blood gas to determine the adequacy of  baseline ventilation and oxygenation should also be considered.  5. Healthy sleep recommendations include: adequate nightly sleep (normal 7-9 hrs/night), avoidance of caffeine after  noon and alcohol near bedtime, and maintaining a sleep environment that is cool, dark and quiet.  6. Weight loss for overweight patients is recommended. Even modest amounts of weight loss can significantly  improve the severity of sleep apnea.  7. Snoring recommendations include: weight loss where appropriate, side sleeping, and avoidance of alcohol before  bed.  8. Operation of motor vehicle should not be performed when sleepy.  Signature: Fransico Him, MD; [facility]; [place], [place] Board of Sleep  Medicine Electronically Signed: 01/24/22

## 2022-02-01 ENCOUNTER — Telehealth: Payer: Self-pay | Admitting: *Deleted

## 2022-02-01 NOTE — Telephone Encounter (Signed)
-----   Message from Lauralee Evener, [place] sent at 01/24/2022 10:01 AM EDT -----  ----- Message ----- From: Sueanne Margarita, MD Sent: 01/24/2022   8:26 AM EDT To: Cv Div Sleep Studies  Needs CPAP titration with sleep aide (order Lunesta 3mg , Rx #1 tablet with 0 refills) take at sleep lab

## 2022-02-01 NOTE — Telephone Encounter (Signed)
The patient has been notified of the result. Left detailed message on voicemail and informed patient to call back.Ponce Skillman Green, CMA 10/12/2021 12:08 PM     

## 2022-02-05 ENCOUNTER — Other Ambulatory Visit: Payer: Self-pay | Admitting: Family Medicine

## 2022-02-12 ENCOUNTER — Other Ambulatory Visit: Payer: Self-pay | Admitting: Family Medicine

## 2022-02-14 ENCOUNTER — Encounter: Payer: Self-pay | Admitting: *Deleted

## 2022-02-14 NOTE — Telephone Encounter (Signed)
This encounter was created in error - please disregard.

## 2022-02-14 NOTE — Telephone Encounter (Signed)
The patient has been notified of the result. Left detailed message on voicemail and informed patient to call back..Merrell Rettinger Green, CMA   

## 2022-02-14 NOTE — Telephone Encounter (Signed)
-----   Message from Lauralee Evener, [place] sent at 01/24/2022 10:01 AM EDT -----  ----- Message ----- From: Sueanne Margarita, MD Sent: 01/24/2022   8:26 AM EDT To: Cv Div Sleep Studies  Needs CPAP titration with sleep aide (order Lunesta 3mg , Rx #1 tablet with 0 refills) take at sleep lab

## 2022-03-01 ENCOUNTER — Encounter (HOSPITAL_COMMUNITY): Payer: Self-pay | Admitting: Emergency Medicine

## 2022-03-01 ENCOUNTER — Ambulatory Visit (HOSPITAL_COMMUNITY): Payer: 59

## 2022-03-01 ENCOUNTER — Ambulatory Visit (HOSPITAL_COMMUNITY)
Admission: EM | Admit: 2022-03-01 | Discharge: 2022-03-01 | Disposition: A | Discharge status: Home / Self Care | Payer: 59 | Attending: Internal Medicine | Admitting: Internal Medicine

## 2022-03-01 ENCOUNTER — Encounter (HOSPITAL_COMMUNITY): Payer: 59 | Admitting: Cardiology

## 2022-03-01 ENCOUNTER — Other Ambulatory Visit: Payer: Self-pay

## 2022-03-01 DIAGNOSIS — R0602 Shortness of breath: Secondary | ICD-10-CM | POA: Diagnosis present

## 2022-03-01 DIAGNOSIS — Z20822 Contact with and (suspected) exposure to covid-19: Secondary | ICD-10-CM | POA: Insufficient documentation

## 2022-03-01 DIAGNOSIS — J069 Acute upper respiratory infection, unspecified: Secondary | ICD-10-CM | POA: Insufficient documentation

## 2022-03-01 LAB — POC INFLUENZA A AND B ANTIGEN (URGENT CARE ONLY)
INFLUENZA A ANTIGEN, POC: NEGATIVE
INFLUENZA B ANTIGEN, POC: NEGATIVE

## 2022-03-01 MED ORDER — BENZONATATE 200 MG PO CAPS: 200.0000 mg | ORAL_CAPSULE | Freq: Three times a day (TID) | ORAL | 0 refills | Status: DC | PRN

## 2022-03-01 NOTE — Discharge Instructions (Addendum)
Your current EKG shows minor changes, but since you are not currently having any chest pain, you may follow up with your cardiologist in the next week. If the shortness of breath gets worse, and you develop chest pain, please go to the ER right away to have more test than what we can do here.   Your Flu test are negative. We will call you if your covid test comes back positive. In the mean time stay home until your test comes back. If it is positive, due to your risk factors we will call in the [facility] which is the antiviral for Covid.

## 2022-03-01 NOTE — ED Triage Notes (Signed)
On Monday, patient started having symptoms.  Cough, headache, chest congestion and runny nose.  No covid test. Has not tried any OTC remedies.  Patient does help out at homeless shelter on [address]

## 2022-03-01 NOTE — ED Provider Notes (Signed)
[facility]    CSN: [id number] Arrival date & time: 03/01/22  1324      History   Chief Complaint Chief Complaint  Patient presents with   Headache    HPI Richard [name] is a 51 y.o. male who presents with onset of cough, HA, rhinitis x 4 days. Has not done a covid test. Has been taking OTC meds. Pt helps at a homeless shelter. Has mild aches. His appetite has not changed. Denies CP.  Has noticed mild SOB climbing steps the past 2 days which is different for him, so he wants to make sure he does not have Covid. He has never had Covid, and has had 2 Covid shots.     Past Medical History:  Diagnosis Date   Acute decompensated heart failure ([place]) 01/03/2013   Allergy    CHF (congestive heart failure) (HCC)    systolic   Chronic kidney disease    CKD (chronic kidney disease) stage 2, GFR 60-89 ml/min 01/04/2013   DM (diabetes mellitus) ([place]) 01/06/2013   Hypertension    hx htn emergency   Obesity    Other secondary pulmonary hypertension ([place]) 12/19/2021   Stroke [facility])     Patient Active Problem List   Diagnosis Date Noted   Other secondary pulmonary hypertension ([place]) 12/19/2021   Prediabetes 11/15/2021   Persistent atrial fibrillation ([place]) 11/09/2021   AKI (acute kidney injury) ([place]) 11/09/2021   HFrEF (heart failure with reduced ejection fraction) ([place]) 11/08/2020   History of CVA (cerebrovascular accident) 11/08/2020   Obesity, diabetes, and hypertension syndrome ([place]) 11/08/2020   Stage 3b chronic kidney disease ([place]) 10/06/2015   Obesity    Disorder associated with well-controlled type 2 diabetes mellitus ([place]) 02/17/2013   Essential hypertension, benign 01/15/2013    Past Surgical History:  Procedure Laterality Date   BUBBLE STUDY  05/22/2019   Procedure: BUBBLE STUDY;  Surgeon: Skeet Latch, MD;  Location: [place];  Service: Cardiovascular;;   CARDIOVERSION N/A 11/13/2021   Procedure: CARDIOVERSION;  Surgeon: Skeet Latch, MD;   Location: [place];  Service: Cardiovascular;  Laterality: N/A;   CARDIOVERSION N/A 11/30/2021   Procedure: CARDIOVERSION;  Surgeon: Josue Hector, MD;  Location: [place];  Service: Cardiovascular;  Laterality: N/A;   LEFT AND RIGHT HEART CATHETERIZATION WITH CORONARY ANGIOGRAM N/A 01/07/2013   Procedure: LEFT AND RIGHT HEART CATHETERIZATION WITH CORONARY ANGIOGRAM;  Surgeon: Birdie Riddle, MD;  Location: [place] CATH LAB;  Service: Cardiovascular;  Laterality: N/A;   TEE WITHOUT CARDIOVERSION N/A 05/22/2019   Procedure: TRANSESOPHAGEAL ECHOCARDIOGRAM (TEE);  Surgeon: Skeet Latch, MD;  Location: [place];  Service: Cardiovascular;  Laterality: N/A;   TEE WITHOUT CARDIOVERSION N/A 11/13/2021   Procedure: TRANSESOPHAGEAL ECHOCARDIOGRAM (TEE);  Surgeon: Skeet Latch, MD;  Location: [place];  Service: Cardiovascular;  Laterality: N/A;       Home Medications    Prior to Admission medications   Medication Sig Start Date End Date Taking? Authorizing Provider  benzonatate (TESSALON) 200 MG capsule Take 1 capsule (200 mg total) by mouth 3 (three) times daily as needed for cough. 03/01/22  Yes Rodriguez-Southworth, Sunday Spillers, PA-C  acetaminophen (TYLENOL) 500 MG tablet Take 1,000 mg by mouth every 6 (six) hours as needed for moderate pain or headache.    [provider]  amiodarone (PACERONE) 400 MG tablet Take by mouth. 12/18/21   [provider]  apixaban (ELIQUIS) 5 MG TABS tablet Take 1 tablet (5 mg total) by mouth 2 (two) times daily. 11/15/21  Samella Parr, NP  atorvastatin (LIPITOR) 40 MG tablet Take 1 tablet (40 mg total) by mouth daily. 11/15/21   Samella Parr, NP  carvedilol (COREG) 12.5 MG tablet Take 1.5 tablets (18.75 mg total) by mouth 2 (two) times daily with a meal. 12/11/21   [place], Maricela Bo, FNP  dapagliflozin propanediol (FARXIGA) 10 MG TABS tablet Take 1 tablet (10 mg total) by mouth daily. 11/16/21   Samella Parr, NP   isosorbide-hydrALAZINE (BIDIL) 20-37.5 MG tablet Take 1 tablet by mouth 3 (three) times daily. 12/11/21   Rafael Bihari, FNP  KLOR-CON M20 20 MEQ tablet TAKE 1 TABLET BY MOUTH TWICE A DAY 01/15/22   Camnitz, Ocie Doyne, MD  losartan (COZAAR) 25 MG tablet Take 1 tablet (25 mg total) by mouth at bedtime. 11/21/21   Shirley Friar, PA-C  Multiple Vitamins-Minerals (MULTIVITAMIN GUMMIES ADULTS PO) Take 2 tablets by mouth daily.    [provider]  Omega-3 Fatty Acids (FISH OIL) 1000 MG CAPS Take 1,000 mg by mouth daily.    [provider]  torsemide (DEMADEX) 20 MG tablet Take 2 tablets (40 mg total) by mouth daily. 11/24/21   Consuelo Pandy, PA-C    Family History Family History  Problem Relation Age of Onset   Heart disease Father 17       MI   Hypertension Father    Hyperlipidemia Father    Diabetes Mother    Hypertension Mother     Social History Social History   Tobacco Use   Smoking status: Never   Smokeless tobacco: Never  Vaping Use   Vaping Use: Never used  Substance Use Topics   Alcohol use: Yes   Drug use: No     Allergies   Patient has no known allergies.   Review of Systems Review of Systems  Constitutional:  Negative for activity change, appetite change, chills, fatigue and fever.  HENT:  Positive for congestion and rhinorrhea. Negative for ear discharge and ear pain.   Respiratory:  Positive for cough and shortness of breath. Negative for chest tightness and wheezing.   Cardiovascular:  Negative for chest pain.       Admits he has taken his BP meds today  Musculoskeletal:  Positive for myalgias.  Skin:  Negative for rash.  Neurological:  Positive for headaches.     Physical Exam Triage Vital Signs ED Triage Vitals  Enc Vitals Group     BP 03/01/22 1404 (!) 189/149     Pulse Rate 03/01/22 1500 76     Resp 03/01/22 1500 20     Temp 03/01/22 1500 98.5 F (36.9 C)     Temp Source 03/01/22 1500 Oral     SpO2  03/01/22 1500 97 %     Weight --      Height --      Head Circumference --      Peak Flow --      Pain Score 03/01/22 1458 7     Pain Loc --      Pain Edu? --      Excl. in [place]? --    No data found.  Updated Vital Signs BP (!) 172/104 (BP Location: Right Arm) Comment (BP Location): large cuff  Pulse 76   Temp 98.5 F (36.9 C) (Oral)   Resp 20   SpO2 97%   Visual Acuity Right Eye Distance:   Left Eye Distance:   Bilateral Distance:    Right Eye Near:  Left Eye Near:    Bilateral Near:      Physical Exam Vitals signs and nursing note reviewed.  Constitutional:      General: he is not in acute distress.    Appearance: Normal appearance. He is not ill-appearing, toxic-appearing or diaphoretic.  HENT:     Head: Normocephalic.     Right Ear: Tympanic membrane, ear canal and external ear normal.     Left Ear: Tympanic membrane, ear canal and external ear normal.     Nose: Nose normal.     Mouth/Throat:     Mouth: Mucous membranes are moist.  Eyes:     General: No scleral icterus.       Right eye: No discharge.        Left eye: No discharge.     Conjunctiva/sclera: Conjunctivae normal.  Neck:     Musculoskeletal: Neck supple. No neck rigidity.  Cardiovascular:     Rate and Rhythm: Normal rate and regular rhythm.     Heart sounds: No murmur.  Pulmonary:     Effort: Pulmonary effort is normal.     Breath sounds: Normal breath sounds.  A Musculoskeletal: Normal range of motion.  Lymphadenopathy:     Cervical: No cervical adenopathy.  Skin:    General: Skin is warm and dry.     Coloration: Skin is not jaundiced.     Findings: No rash.  Neurological:     Mental Status: She is alert and oriented to person, place, and time.     Gait: Gait normal.  Psychiatric:        Mood and Affect: Mood normal.        Behavior: Behavior normal.        Thought Content: Thought content normal.        Judgment: Judgment normal.    UC Treatments / Results  Labs (all labs  ordered are listed, but only abnormal results are displayed) Labs Reviewed  SARS CORONAVIRUS 2 (TAT 6-24 HRS)  POC INFLUENZA A AND B ANTIGEN (URGENT CARE ONLY)   Rapid Flu A&B negative EKG This was reviewed with Dr Mannie Stabile and show minor ST changes in V4 when compared to EKG from 07/'23.   Radiology No results found.  Procedures Procedures (including critical care time)  Medications Ordered in UC Medications - No data to display  Initial Impression / Assessment and Plan / UC Course  I have reviewed the triage vital signs and the nursing notes.  Pertinent labs & imaging results that were available during my care of the patient were reviewed by me and considered in my medical decision making (see chart for details).  Flu like symptoms/URI SOB with exertion  Covid test is pending, we will call him if positive I placed him on Tessalon   Since he is not having chest pains, he was advised to call his cardiologist and FU with them in the next week.         Final Clinical Impressions(s) / UC Diagnoses   Final diagnoses:  Acute upper respiratory infection  SOB (shortness of breath) on exertion     Discharge Instructions      Your current EKG shows minor changes, but since you are not currently having any chest pain, you may follow up with your cardiologist in the next week. If the shortness of breath gets worse, and you develop chest pain, please go to the ER right away to have more test than what we can do here.  Your Flu test are negative. We will call you if your covid test comes back positive. In the mean time stay home until your test comes back. If it is positive, due to your risk factors we will call in the [facility] which is the antiviral for Covid.      ED Prescriptions     Medication Sig Dispense Auth. Provider   benzonatate (TESSALON) 200 MG capsule Take 1 capsule (200 mg total) by mouth 3 (three) times daily as needed for cough. 30 capsule  Rodriguez-Southworth, Sunday Spillers, PA-C      PDMP not reviewed this encounter.   Shelby Mattocks, [place] 03/01/22 1637

## 2022-03-02 ENCOUNTER — Telehealth: Payer: Self-pay

## 2022-03-02 ENCOUNTER — Other Ambulatory Visit: Payer: Self-pay

## 2022-03-02 LAB — SARS CORONAVIRUS 2 (TAT 6-24 HRS): SARS Coronavirus 2: NEGATIVE

## 2022-03-02 MED ORDER — DAPAGLIFLOZIN PROPANEDIOL 10 MG PO TABS: 10.0000 mg | ORAL_TABLET | Freq: Every day | ORAL | 0 refills | Status: DC

## 2022-03-02 MED ORDER — APIXABAN 5 MG PO TABS: 5.0000 mg | ORAL_TABLET | Freq: Two times a day (BID) | ORAL | 5 refills | Status: DC

## 2022-03-02 NOTE — Telephone Encounter (Signed)
Refills sent to requesting pharmacy

## 2022-03-02 NOTE — Telephone Encounter (Addendum)
Patient states that he needs a refill on his [place] and Eliquis. Pt wants to know if Dr. Gasper Sells would refill his medications.    Preferred Pharmacy: [facility]

## 2022-03-02 NOTE — Telephone Encounter (Signed)
Prescription refill request for Eliquis received. Indication: AF Last office visit: 12/18/21  Claudina Lick PA-C Scr: 1.95 on 12/18/21 Age: 51 Weight: 137.9kg  Based on above findings Eliquis 5mg  twice daily is the appropriate dose.  Refill approved.

## 2022-03-07 NOTE — Telephone Encounter (Signed)
The patient has been notified of the result. Left detailed message on voicemail and informed patient to call back..Parish Augustine Green, CMA   

## 2022-03-20 ENCOUNTER — Telehealth (HOSPITAL_COMMUNITY): Payer: Self-pay

## 2022-03-20 NOTE — Telephone Encounter (Signed)
FMLA paper work faxed on 03/20/22 at 11.15 am. Patient called and left message to inform him that the paperwork has been faxed for him

## 2022-04-05 ENCOUNTER — Ambulatory Visit (HOSPITAL_COMMUNITY): Payer: 59

## 2022-04-05 ENCOUNTER — Encounter (HOSPITAL_COMMUNITY): Payer: 59 | Admitting: Cardiology

## 2022-04-30 ENCOUNTER — Ambulatory Visit: Payer: 59 | Admitting: Cardiology

## 2022-05-03 ENCOUNTER — Ambulatory Visit: Payer: Self-pay | Attending: Cardiology | Admitting: Cardiology

## 2022-05-03 NOTE — Progress Notes (Deleted)
Electrophysiology Office Note   Date:  05/03/2022   ID:  Richard [name], DOB 1970/10/24, MRN [id number]  PCP:  Pcp, No  Cardiologist:  Gasper Sells Primary Electrophysiologist:  Jyra Lagares Meredith Leeds, MD    Chief Complaint: AF   History of Present Illness: Richard [name] is a 51 y.o. male who is being seen today for the evaluation of AF at the request of Maximiano Coss, NP. Presenting today for electrophysiology evaluation.  He has a history significant for chronic systolic heart failure, CKD stage II, diabetes, hypertension, obesity, CVA.  He wore a cardiac monitor that showed episodes of atrial fibrillation.  He is now on amiodarone.  Today, he denies*** symptoms of palpitations, chest pain, shortness of breath, orthopnea, PND, lower extremity edema, claudication, dizziness, presyncope, syncope, bleeding, or neurologic sequela. The patient is tolerating medications without difficulties.    Past Medical History:  Diagnosis Date   Acute decompensated heart failure ([place]) 01/03/2013   Allergy    CHF (congestive heart failure) (HCC)    systolic   Chronic kidney disease    CKD (chronic kidney disease) stage 2, GFR 60-89 ml/min 01/04/2013   DM (diabetes mellitus) ([place]) 01/06/2013   Hypertension    hx htn emergency   Obesity    Other secondary pulmonary hypertension ([place]) 12/19/2021   Stroke [facility])    Past Surgical History:  Procedure Laterality Date   BUBBLE STUDY  05/22/2019   Procedure: BUBBLE STUDY;  Surgeon: Skeet Latch, MD;  Location: [place];  Service: Cardiovascular;;   CARDIOVERSION N/A 11/13/2021   Procedure: CARDIOVERSION;  Surgeon: Skeet Latch, MD;  Location: [place];  Service: Cardiovascular;  Laterality: N/A;   CARDIOVERSION N/A 11/30/2021   Procedure: CARDIOVERSION;  Surgeon: Josue Hector, MD;  Location: [place];  Service: Cardiovascular;  Laterality: N/A;   LEFT AND RIGHT HEART CATHETERIZATION WITH CORONARY ANGIOGRAM N/A 01/07/2013    Procedure: LEFT AND RIGHT HEART CATHETERIZATION WITH CORONARY ANGIOGRAM;  Surgeon: Birdie Riddle, MD;  Location: [place] CATH LAB;  Service: Cardiovascular;  Laterality: N/A;   TEE WITHOUT CARDIOVERSION N/A 05/22/2019   Procedure: TRANSESOPHAGEAL ECHOCARDIOGRAM (TEE);  Surgeon: Skeet Latch, MD;  Location: [place];  Service: Cardiovascular;  Laterality: N/A;   TEE WITHOUT CARDIOVERSION N/A 11/13/2021   Procedure: TRANSESOPHAGEAL ECHOCARDIOGRAM (TEE);  Surgeon: Skeet Latch, MD;  Location: [facility] ENDOSCOPY;  Service: Cardiovascular;  Laterality: N/A;     Current Outpatient Medications  Medication Sig Dispense Refill   acetaminophen (TYLENOL) 500 MG tablet Take 1,000 mg by mouth every 6 (six) hours as needed for moderate pain or headache.     amiodarone (PACERONE) 400 MG tablet Take by mouth.     apixaban (ELIQUIS) 5 MG TABS tablet Take 1 tablet (5 mg total) by mouth 2 (two) times daily. 60 tablet 5   atorvastatin (LIPITOR) 40 MG tablet Take 1 tablet (40 mg total) by mouth daily. 90 tablet 0   benzonatate (TESSALON) 200 MG capsule Take 1 capsule (200 mg total) by mouth 3 (three) times daily as needed for cough. 30 capsule 0   carvedilol (COREG) 12.5 MG tablet Take 1.5 tablets (18.75 mg total) by mouth 2 (two) times daily with a meal. 90 tablet 3   dapagliflozin propanediol (FARXIGA) 10 MG TABS tablet Take 1 tablet (10 mg total) by mouth daily. 90 tablet 0   isosorbide-hydrALAZINE (BIDIL) 20-37.5 MG tablet Take 1 tablet by mouth 3 (three) times daily. 90 tablet 3   KLOR-CON M20 20 MEQ tablet TAKE 1 TABLET BY MOUTH TWICE  A DAY 180 tablet 1   losartan (COZAAR) 25 MG tablet Take 1 tablet (25 mg total) by mouth at bedtime. 90 tablet 3   Multiple Vitamins-Minerals (MULTIVITAMIN GUMMIES ADULTS PO) Take 2 tablets by mouth daily.     Omega-3 Fatty Acids (FISH OIL) 1000 MG CAPS Take 1,000 mg by mouth daily.     torsemide (DEMADEX) 20 MG tablet Take 2 tablets (40 mg total) by mouth daily. 180 tablet 3    No current facility-administered medications for this visit.    Allergies:   Patient has no known allergies.   Social History:  The patient  reports that he has never smoked. He has never used smokeless tobacco. He reports current alcohol use. He reports that he does not use drugs.   Family History:  The patient's family history includes Diabetes in his mother; Heart disease (age of onset: 69) in his father; Hyperlipidemia in his father; Hypertension in his father and mother.    ROS:  Please see the history of present illness.   Otherwise, review of systems is positive for none.   All other systems are reviewed and negative.    PHYSICAL EXAM: VS:  There were no vitals taken for this visit. , BMI There is no height or weight on file to calculate BMI. GEN: Well nourished, well developed, in no acute distress  HEENT: normal  Neck: no JVD, carotid bruits, or masses Cardiac: ***RRR; no murmurs, rubs, or gallops,no edema  Respiratory:  clear to auscultation bilaterally, normal work of breathing GI: soft, nontender, nondistended, + BS MS: no deformity or atrophy  Skin: warm and dry Neuro:  Strength and sensation are intact Psych: euthymic mood, full affect  EKG:  EKG {ACTION; IS/IS [id number]} ordered today. Personal review of the ekg ordered *** shows ***  Recent Labs: 11/09/2021: ALT 53; Magnesium 1.9; TSH 1.293 11/21/2021: Hemoglobin 14.5; Platelets 235 11/28/2021: B Natriuretic Peptide 696.5 12/18/2021: BUN 23; Creatinine, Ser 1.95; Potassium 3.6; Sodium 145    Lipid Panel     Component Value Date/Time   CHOL 149 06/10/2019 1014   TRIG 63 06/10/2019 1014   HDL 43 06/10/2019 1014   CHOLHDL 3.5 06/10/2019 1014   CHOLHDL 5.3 05/20/2019 0800   VLDL 21 05/20/2019 0800   LDLCALC 93 06/10/2019 1014     Wt Readings from Last 3 Encounters:  01/07/22 (!) 304 lb (137.9 kg)  12/18/21 (!) 304 lb (137.9 kg)  12/11/21 (!) 303 lb 9.6 oz (137.7 kg)      Other studies  Reviewed: Additional studies/ records that were reviewed today include: TTE 11/09/21  Review of the above records today demonstrates:  1. Left ventricular ejection fraction, by estimation, is 25 to 30%. The  left ventricle has severely decreased function. The left ventricle  demonstrates global hypokinesis. The left ventricular internal cavity size  was mildly dilated. Left ventricular  diastolic function could not be evaluated.   2. Right ventricular systolic function is normal. The right ventricular  size is normal. There is moderately elevated pulmonary artery systolic  pressure.   3. Left atrial size was mildly dilated.   4. The mitral valve is normal in structure. Trivial mitral valve  regurgitation. No evidence of mitral stenosis.   5. The aortic valve is normal in structure. Aortic valve regurgitation is  not visualized. No aortic stenosis is present.   6. The inferior vena cava is dilated in size with <50% respiratory  variability, suggesting right atrial pressure of 15 mmHg.  ASSESSMENT AND PLAN:  1.  Persistent atrial fibrillation: Status post cardioversion June 2023.  Currently on carvedilol, amiodarone 200 mg daily, Eliquis 5 mg twice daily.  CHA2DS2-VASc of 5.  He has developed heart failure associated with his rapid atrial fibrillation.  Rhythm control would be most beneficial.***  2.  Chronic systolic heart failure: Ejection fraction 25 to 30%.  Nonischemic based on prior work-up.***  3.  CKD stage IIIb-IV: Plan per nephrology  4.  Hypertension:***  5.  Secondary hypercoagulable state: Currently on Eliquis for atrial fibrillation as above  6.  Obstructive sleep apnea: CPAP compliance encouraged  7.  Morbid obesity: Lifestyle modification encouraged There is no height or weight on file to calculate BMI.   Current medicines are reviewed at length with the patient today.   The patient {ACTIONS; HAS/DOES NOT HAVE:19233} concerns regarding his medicines.  The following  changes were made today:  {NONE DEFAULTED:18576}  Labs/ tests ordered today include: *** No orders of the defined types were placed in this encounter.    Disposition:   FU with [place] Emmitt {gen number [id number]} {Days to years:10300}  Signed, [place] Subramaniam Meredith Leeds, MD  05/03/2022 9:14 AM     [place] [address] [phone] (office) [phone] (fax)

## 2022-05-28 ENCOUNTER — Other Ambulatory Visit: Payer: Self-pay | Admitting: Internal Medicine

## 2022-06-20 ENCOUNTER — Other Ambulatory Visit: Payer: Self-pay | Admitting: Internal Medicine

## 2022-06-20 DIAGNOSIS — I10 Essential (primary) hypertension: Secondary | ICD-10-CM

## 2022-08-15 ENCOUNTER — Other Ambulatory Visit: Payer: Self-pay | Admitting: Internal Medicine

## 2022-08-15 DIAGNOSIS — I10 Essential (primary) hypertension: Secondary | ICD-10-CM

## 2022-08-17 ENCOUNTER — Ambulatory Visit: Payer: 59 | Admitting: Podiatry

## 2022-08-17 ENCOUNTER — Telehealth: Payer: Self-pay | Admitting: Internal Medicine

## 2022-08-17 ENCOUNTER — Other Ambulatory Visit (HOSPITAL_COMMUNITY): Payer: Self-pay

## 2022-08-17 MED ORDER — CARVEDILOL 12.5 MG PO TABS: 18.7500 mg | ORAL_TABLET | Freq: Two times a day (BID) | ORAL | 0 refills | Status: DC

## 2022-08-17 NOTE — Telephone Encounter (Signed)
*  STAT* If patient is at the pharmacy, call can be transferred to refill team.   1. Which medications need to be refilled? (please list name of each medication and dose if known) carvedilol (COREG) 12.5 MG tablet   2. Which pharmacy/location (including street and city if local pharmacy) is medication to be sent to?  [id number] [place], [place] - [address]    3. Do they need a 30 day or 90 day supply? 90 day

## 2022-08-17 NOTE — Telephone Encounter (Signed)
This is a CHF pt 

## 2022-09-03 NOTE — Progress Notes (Unsigned)
Electrophysiology Office Note:   Date:  09/04/2022  ID:  [place] Bing, DOB [birth year], MRN [id number]  Primary Cardiologist: Richard Lean, MD Electrophysiologist: Richard Haw, MD   History of Present Illness:   Richard [name] is a 52 y.o. male with h/o persistent AF, chronic systolic CHF, CKD III, and HTN seen today for routine electrophysiology followup. Since last being seen in our clinic the patient reports doing OK. He moved to a different job and went for a time [employer], cites this for reason for long follow up. Now working as Warden/ranger at [employer]. Does have some arthritic pain in his left leg that makes ambulating difficult at long distances. Edema waxes/wanes, but is overall well controlled on torsemide. He misses his middle dose of Bidil quite frequently, and didn't take coreg this am.   He understands his heart is weak and wants to try and get things under better control. Wants to work on weight loss but has trouble ambulating moderate to long distances. Denies chest pain. Some dyspnea with more than moderate exertion.   Review of systems complete and found to be negative unless listed in HPI.   Studies Reviewed:    EKG is ordered today. Personal review shows sinus tachycardia at 103 bpm  Risk Assessment/Calculations:    HYPERTENSION CONTROL Vitals:   09/04/22 1041 09/04/22 1055  BP: (!) 142/88 (!) 160/108    The patient's blood pressure is elevated above target today.  In order to address the patient's elevated BP: A new medication was prescribed today.           Physical Exam:   VS:  BP (!) 160/108   Pulse (!) 103   Ht 5\' 11"  (1.803 m)   Wt (!) 324 lb 12.8 oz (147.3 kg)   SpO2 95%   BMI 45.30 kg/m    Wt Readings from Last 3 Encounters:  09/04/22 (!) 324 lb 12.8 oz (147.3 kg)  01/07/22 (!) 304 lb (137.9 kg)  12/18/21 (!) 304 lb (137.9 kg)     GEN: Well nourished, well developed in no acute distress NECK: No JVD; No carotid  bruits CARDIAC: Tachy, but regular rate and rhythm, no murmurs, rubs, gallops RESPIRATORY:  Clear to auscultation without rales, wheezing or rhonchi  ABDOMEN: Soft, non-tender, non-distended EXTREMITIES:  No edema; No deformity   ASSESSMENT AND PLAN:   Persistent atrial fibrillation EKG today shows sinus tachycardia.  Continue amiodarone 200 mg daily. He confirms he has been taking it this way despite list of 400 mg daily.  Poor candidate for tikosyn given h/o AKI and non-compliance Continue eliquis 5 mg BID  Chronic systolic CHF TTE [id number] LVEF 25-30% , EF <20% by TEE.  NICM based on prior work up, potential tachy mediated component, but had been down in the past prior to AF diagnosis.  GDMT as tolerated. Will update echo.  Change losartan back to Entrestro 24/26 mg BID now that his insurance is active again. If cannot afford, would go up on his losartan.   CKD III Labs today  HTN Elevated on arrival Will attempt to change back to [facility]  In addition, he misses his midday dose of Bidil quite regularly, and didn't take his coreg this am.  Med compliance encouraged.   Obesity Body mass index is 45.3 kg/m.  Encouraged lifestyle modification   Follow up with Dr. Curt Bears in 4-6 months. He understands he needs a weight loss to be an ideal ablation candidate. Still, he is rather young to  be on lifelong amiodarone.   Signed, Richard Friar, PA-C

## 2022-09-04 ENCOUNTER — Ambulatory Visit: Payer: BC Managed Care – PPO | Attending: Student | Admitting: Student

## 2022-09-04 ENCOUNTER — Encounter: Payer: Self-pay | Admitting: Student

## 2022-09-04 VITALS — BP 160/108 | HR 103 | Ht 71.0 in | Wt 324.8 lb

## 2022-09-04 DIAGNOSIS — I5022 Chronic systolic (congestive) heart failure: Secondary | ICD-10-CM | POA: Diagnosis not present

## 2022-09-04 DIAGNOSIS — I4819 Other persistent atrial fibrillation: Secondary | ICD-10-CM

## 2022-09-04 DIAGNOSIS — I10 Essential (primary) hypertension: Secondary | ICD-10-CM

## 2022-09-04 DIAGNOSIS — G4733 Obstructive sleep apnea (adult) (pediatric): Secondary | ICD-10-CM

## 2022-09-04 DIAGNOSIS — N1832 Chronic kidney disease, stage 3b: Secondary | ICD-10-CM

## 2022-09-04 MED ORDER — SACUBITRIL-VALSARTAN 24-26 MG PO TABS: 1.0000 | ORAL_TABLET | Freq: Two times a day (BID) | ORAL | 11 refills | Status: DC

## 2022-09-04 NOTE — Patient Instructions (Signed)
Medication Instructions:  1.Stop losartan 2.Start entresto 24-26 mg twice a day *If you need a refill on your cardiac medications before your next appointment, please call your pharmacy*   Lab Work: CMET, TSH, Free T4--TODAY If you have labs (blood work) drawn today and your tests are completely normal, you will receive your results only by: [place] (if you have MyChart) OR A paper copy in the mail If you have any lab test that is abnormal or we need to change your treatment, we will call you to review the results.   Testing/Procedures: Your physician has requested that you have an echocardiogram. Echocardiography is a painless test that uses sound waves to create images of your heart. It provides your doctor with information about the size and shape of your heart and how well your heart's chambers and valves are working. This procedure takes approximately one hour. There are no restrictions for this procedure. Please do NOT wear cologne, perfume, aftershave, or lotions (deodorant is allowed). Please arrive 15 minutes prior to your appointment time.    Follow-Up: At [facility], you and your health needs are our priority.  As part of our continuing mission to provide you with exceptional heart care, we have created designated Provider Care Teams.  These Care Teams include your primary Cardiologist (physician) and Advanced Practice Providers (APPs -  Physician Assistants and Nurse Practitioners) who all work together to provide you with the care you need, when you need it.  We recommend signing up for the patient portal called "MyChart".  Sign up information is provided on this After Visit Summary.  MyChart is used to connect with patients for Virtual Visits (Telemedicine).  Patients are able to view lab/test results, encounter notes, upcoming appointments, etc.  Non-urgent messages can be sent to your provider as well.   To learn more about what you can do with MyChart, go  to [email or web address].    Your next appointment:   4 month(s)  Provider:   Allegra Lai, MD   Schedule an appointment with the heart failure clinic for a date that is after you have completed the ECHO.

## 2022-09-05 ENCOUNTER — Telehealth: Payer: Self-pay

## 2022-09-05 DIAGNOSIS — I10 Essential (primary) hypertension: Secondary | ICD-10-CM

## 2022-09-05 LAB — COMPREHENSIVE METABOLIC PANEL
ALT: 14 IU/L (ref 0–44)
AST: 17 IU/L (ref 0–40)
Albumin/Globulin Ratio: 1.4 (ref 1.2–2.2)
Albumin: 3.9 g/dL (ref 3.8–4.9)
Alkaline Phosphatase: 59 IU/L (ref 44–121)
BUN/Creatinine Ratio: 10 (ref 9–20)
BUN: 19 mg/dL (ref 6–24)
Bilirubin Total: 0.7 mg/dL (ref 0.0–1.2)
CO2: 24 mmol/L (ref 20–29)
Calcium: 8.2 mg/dL — ABNORMAL LOW (ref 8.7–10.2)
Chloride: 100 mmol/L (ref 96–106)
Creatinine, Ser: 1.97 mg/dL — ABNORMAL HIGH (ref 0.76–1.27)
Globulin, Total: 2.7 g/dL (ref 1.5–4.5)
Glucose: 193 mg/dL — ABNORMAL HIGH (ref 70–99)
Potassium: 3.4 mmol/L — ABNORMAL LOW (ref 3.5–5.2)
Sodium: 141 mmol/L (ref 134–144)
Total Protein: 6.6 g/dL (ref 6.0–8.5)
eGFR: 40 mL/min/{1.73_m2} — ABNORMAL LOW (ref >59)

## 2022-09-05 LAB — T4, FREE: Free T4: 1.83 ng/dL — ABNORMAL HIGH (ref 0.82–1.77)

## 2022-09-05 LAB — TSH: TSH: 1.22 u[IU]/mL (ref 0.450–4.500)

## 2022-09-05 NOTE — Telephone Encounter (Signed)
-----   Message from Shirley Friar, PA-C sent at 09/05/2022  8:05 AM EDT ----- Can we please confirm how he is taking his potassium?    If he is taking 20 meq BID as ordered, would just have him take an extra 40 meq ONCE and then continue, since we are restarting the Entresto.   Needs repeat BMET 7-10 days.   If he is NOT taking potassium will need to resume.

## 2022-09-05 NOTE — Telephone Encounter (Signed)
Left voicemail to return call to office.

## 2022-09-21 ENCOUNTER — Other Ambulatory Visit (HOSPITAL_COMMUNITY): Payer: Self-pay | Admitting: Family Medicine

## 2022-10-02 ENCOUNTER — Encounter (HOSPITAL_COMMUNITY): Payer: Self-pay | Admitting: Student

## 2022-10-02 ENCOUNTER — Ambulatory Visit (HOSPITAL_COMMUNITY): Payer: BC Managed Care – PPO | Attending: Student

## 2022-10-05 ENCOUNTER — Encounter (HOSPITAL_COMMUNITY): Payer: BC Managed Care – PPO

## 2022-10-24 ENCOUNTER — Other Ambulatory Visit (HOSPITAL_COMMUNITY): Payer: Self-pay | Admitting: Cardiology

## 2022-11-26 ENCOUNTER — Other Ambulatory Visit (HOSPITAL_COMMUNITY): Payer: Self-pay | Admitting: Cardiology

## 2022-11-28 ENCOUNTER — Telehealth: Payer: Self-pay | Admitting: Internal Medicine

## 2022-11-28 NOTE — Telephone Encounter (Signed)
Pt c/o swelling: STAT is pt has developed SOB within 24 hours  How much weight have you gained and in what time span? About 15-20lbs in 3 weeks   If swelling, where is the swelling located? Inner thigh and behind knee on both legs but not as much as the right leg.   Are you currently taking a fluid pill? Yes  Are you currently SOB? No  Do you have a log of your daily weights (if so, list)? No  Have you gained 3 pounds in a day or 5 pounds in a week? 5 lbs in a week.   Have you traveled recently? Yes, got back from [place] on June 7th and was there for a week.

## 2022-11-28 NOTE — Telephone Encounter (Signed)
Left message for the pt to call the clinic

## 2022-12-04 NOTE — Telephone Encounter (Signed)
Left a message to call back.  Advised pt to call our office or HF Clinic as pt not seen by general cardiology since 2022.  Pt last seen by HF Clinic 11/28/21.

## 2022-12-13 NOTE — Telephone Encounter (Signed)
Left a message to call back. 3rd attempt at reaching pt with no return call.  Will complete call d/t no return call from patient.

## 2023-01-08 ENCOUNTER — Ambulatory Visit: Payer: Managed Care, Other (non HMO) | Admitting: Cardiology

## 2023-01-17 ENCOUNTER — Other Ambulatory Visit (HOSPITAL_COMMUNITY): Payer: Self-pay | Admitting: Student

## 2023-02-06 NOTE — Progress Notes (Deleted)
Electrophysiology Office Note:   Date:  02/06/2023  ID:  Richard [name], DOB 28-Jan-1971, MRN [id number]  Primary Cardiologist: Christell Constant, MD Electrophysiologist: Will Jorja Loa, MD  {Click to update primary MD,subspecialty MD or APP then REFRESH:1}    History of Present Illness:   Richard [name] is a 52 y.o. male with h/o persistent AF, Chronic systolic CHF, CKD III, and HTN seen today for routine electrophysiology followup.   Since last being seen in our clinic the patient reports doing ***.  he denies chest pain, palpitations, dyspnea, PND, orthopnea, nausea, vomiting, dizziness, syncope, edema, weight gain, or early satiety.   Review of systems complete and found to be negative unless listed in HPI.   EP Information / Studies Reviewed:    EKG is ordered today. Personal review as below.       ***  Physical Exam:   VS:  There were no vitals taken for this visit.   Wt Readings from Last 3 Encounters:  09/04/22 (!) 324 lb 12.8 oz (147.3 kg)  01/07/22 (!) 304 lb (137.9 kg)  12/18/21 (!) 304 lb (137.9 kg)     GEN: Well nourished, well developed in no acute distress NECK: No JVD; No carotid bruits CARDIAC: {EPRHYTHM:28826}, no murmurs, rubs, gallops RESPIRATORY:  Clear to auscultation without rales, wheezing or rhonchi  ABDOMEN: Soft, non-tender, non-distended EXTREMITIES:  No edema; No deformity   ASSESSMENT AND PLAN:    Persistent Atrial Fibrillation  EKG today shows *** Continue Eliquis 5mg  for CHA2DS2VASC of at least 5 Continue Amiodarone 200mg     Secondary hypercoagulable state Pt on Eliquis as above    HFrEF Echo 11/09/2021 LVEF 25-30 GDMT as tolerated Plan to update Echo, ordered but never done.   HTN Stable on current regimen   {Click here to Review PMH, Prob List, Meds, Allergies, SHx, FHx  :1}   Follow up with {NWGNF:62130} {EPFOLLOW QM:57846}  Signed, Graciella Freer, PA-C

## 2023-02-07 ENCOUNTER — Ambulatory Visit: Payer: Managed Care, Other (non HMO) | Attending: Cardiology | Admitting: Student

## 2023-02-07 DIAGNOSIS — I5022 Chronic systolic (congestive) heart failure: Secondary | ICD-10-CM

## 2023-02-07 DIAGNOSIS — I4819 Other persistent atrial fibrillation: Secondary | ICD-10-CM

## 2023-02-07 DIAGNOSIS — G4733 Obstructive sleep apnea (adult) (pediatric): Secondary | ICD-10-CM

## 2023-02-07 DIAGNOSIS — I10 Essential (primary) hypertension: Secondary | ICD-10-CM

## 2023-02-08 ENCOUNTER — Encounter: Payer: Self-pay | Admitting: Student

## 2023-03-04 ENCOUNTER — Encounter (HOSPITAL_COMMUNITY): Payer: Self-pay

## 2023-03-04 ENCOUNTER — Other Ambulatory Visit: Payer: Self-pay

## 2023-03-04 ENCOUNTER — Emergency Department (HOSPITAL_COMMUNITY): Payer: Self-pay

## 2023-03-04 ENCOUNTER — Inpatient Hospital Stay (HOSPITAL_COMMUNITY)
Admission: EM | Admit: 2023-03-04 | Discharge: 2023-03-12 | DRG: 286 | Disposition: A | Discharge status: Home Health | Payer: Managed Care, Other (non HMO) | Attending: Internal Medicine | Admitting: Internal Medicine

## 2023-03-04 DIAGNOSIS — I639 Cerebral infarction, unspecified: Secondary | ICD-10-CM

## 2023-03-04 DIAGNOSIS — E876 Hypokalemia: Secondary | ICD-10-CM | POA: Diagnosis not present

## 2023-03-04 DIAGNOSIS — T502X5A Adverse effect of carbonic-anhydrase inhibitors, benzothiadiazides and other diuretics, initial encounter: Secondary | ICD-10-CM | POA: Diagnosis not present

## 2023-03-04 DIAGNOSIS — E66813 Obesity, class 3: Secondary | ICD-10-CM | POA: Diagnosis present

## 2023-03-04 DIAGNOSIS — I5020 Unspecified systolic (congestive) heart failure: Secondary | ICD-10-CM | POA: Diagnosis present

## 2023-03-04 DIAGNOSIS — E1169 Type 2 diabetes mellitus with other specified complication: Secondary | ICD-10-CM | POA: Diagnosis present

## 2023-03-04 DIAGNOSIS — Z5971 Insufficient health insurance coverage: Secondary | ICD-10-CM

## 2023-03-04 DIAGNOSIS — T45516A Underdosing of anticoagulants, initial encounter: Secondary | ICD-10-CM | POA: Diagnosis present

## 2023-03-04 DIAGNOSIS — R0602 Shortness of breath: Principal | ICD-10-CM

## 2023-03-04 DIAGNOSIS — E1165 Type 2 diabetes mellitus with hyperglycemia: Secondary | ICD-10-CM | POA: Diagnosis present

## 2023-03-04 DIAGNOSIS — Z8673 Personal history of transient ischemic attack (TIA), and cerebral infarction without residual deficits: Secondary | ICD-10-CM

## 2023-03-04 DIAGNOSIS — Z833 Family history of diabetes mellitus: Secondary | ICD-10-CM

## 2023-03-04 DIAGNOSIS — N1832 Chronic kidney disease, stage 3b: Secondary | ICD-10-CM | POA: Diagnosis present

## 2023-03-04 DIAGNOSIS — I152 Hypertension secondary to endocrine disorders: Secondary | ICD-10-CM

## 2023-03-04 DIAGNOSIS — Z23 Encounter for immunization: Secondary | ICD-10-CM

## 2023-03-04 DIAGNOSIS — N184 Chronic kidney disease, stage 4 (severe): Secondary | ICD-10-CM | POA: Diagnosis present

## 2023-03-04 DIAGNOSIS — I5082 Biventricular heart failure: Secondary | ICD-10-CM | POA: Diagnosis present

## 2023-03-04 DIAGNOSIS — Z79899 Other long term (current) drug therapy: Secondary | ICD-10-CM

## 2023-03-04 DIAGNOSIS — I428 Other cardiomyopathies: Secondary | ICD-10-CM | POA: Diagnosis present

## 2023-03-04 DIAGNOSIS — Z91148 Patient's other noncompliance with medication regimen for other reason: Secondary | ICD-10-CM

## 2023-03-04 DIAGNOSIS — E1122 Type 2 diabetes mellitus with diabetic chronic kidney disease: Secondary | ICD-10-CM | POA: Diagnosis present

## 2023-03-04 DIAGNOSIS — Z6841 Body Mass Index (BMI) 40.0 and over, adult: Secondary | ICD-10-CM

## 2023-03-04 DIAGNOSIS — G4733 Obstructive sleep apnea (adult) (pediatric): Secondary | ICD-10-CM | POA: Diagnosis present

## 2023-03-04 DIAGNOSIS — D649 Anemia, unspecified: Secondary | ICD-10-CM | POA: Diagnosis present

## 2023-03-04 DIAGNOSIS — Z8249 Family history of ischemic heart disease and other diseases of the circulatory system: Secondary | ICD-10-CM

## 2023-03-04 DIAGNOSIS — I130 Hypertensive heart and chronic kidney disease with heart failure and stage 1 through stage 4 chronic kidney disease, or unspecified chronic kidney disease: Principal | ICD-10-CM | POA: Diagnosis present

## 2023-03-04 DIAGNOSIS — I5043 Acute on chronic combined systolic (congestive) and diastolic (congestive) heart failure: Secondary | ICD-10-CM | POA: Diagnosis present

## 2023-03-04 DIAGNOSIS — E8809 Other disorders of plasma-protein metabolism, not elsewhere classified: Secondary | ICD-10-CM | POA: Diagnosis present

## 2023-03-04 DIAGNOSIS — E118 Type 2 diabetes mellitus with unspecified complications: Secondary | ICD-10-CM | POA: Diagnosis present

## 2023-03-04 DIAGNOSIS — I480 Paroxysmal atrial fibrillation: Secondary | ICD-10-CM | POA: Diagnosis present

## 2023-03-04 DIAGNOSIS — E871 Hypo-osmolality and hyponatremia: Secondary | ICD-10-CM | POA: Diagnosis present

## 2023-03-04 DIAGNOSIS — Z7901 Long term (current) use of anticoagulants: Secondary | ICD-10-CM

## 2023-03-04 DIAGNOSIS — I5023 Acute on chronic systolic (congestive) heart failure: Secondary | ICD-10-CM | POA: Diagnosis present

## 2023-03-04 DIAGNOSIS — E1159 Type 2 diabetes mellitus with other circulatory complications: Secondary | ICD-10-CM

## 2023-03-04 DIAGNOSIS — R7303 Prediabetes: Secondary | ICD-10-CM

## 2023-03-04 DIAGNOSIS — Z1152 Encounter for screening for COVID-19: Secondary | ICD-10-CM

## 2023-03-04 DIAGNOSIS — I10 Essential (primary) hypertension: Secondary | ICD-10-CM | POA: Diagnosis present

## 2023-03-04 DIAGNOSIS — E785 Hyperlipidemia, unspecified: Secondary | ICD-10-CM | POA: Diagnosis present

## 2023-03-04 DIAGNOSIS — I493 Ventricular premature depolarization: Secondary | ICD-10-CM | POA: Diagnosis not present

## 2023-03-04 DIAGNOSIS — Z91120 Patient's intentional underdosing of medication regimen due to financial hardship: Secondary | ICD-10-CM

## 2023-03-04 DIAGNOSIS — E669 Obesity, unspecified: Secondary | ICD-10-CM | POA: Diagnosis present

## 2023-03-04 DIAGNOSIS — Z91119 Patient's noncompliance with dietary regimen due to unspecified reason: Secondary | ICD-10-CM

## 2023-03-04 DIAGNOSIS — I4819 Other persistent atrial fibrillation: Secondary | ICD-10-CM | POA: Diagnosis present

## 2023-03-04 DIAGNOSIS — N179 Acute kidney failure, unspecified: Secondary | ICD-10-CM | POA: Diagnosis present

## 2023-03-04 LAB — COMPREHENSIVE METABOLIC PANEL
ALT: 18 U/L (ref 0–44)
AST: 20 U/L (ref 15–41)
Albumin: 2.9 g/dL — ABNORMAL LOW (ref 3.5–5.0)
Alkaline Phosphatase: 38 U/L (ref 38–126)
Anion gap: 12 (ref 5–15)
BUN: 25 mg/dL — ABNORMAL HIGH (ref 6–20)
CO2: 20 mmol/L — ABNORMAL LOW (ref 22–32)
Calcium: 8.3 mg/dL — ABNORMAL LOW (ref 8.9–10.3)
Chloride: 106 mmol/L (ref 98–111)
Creatinine, Ser: 2.67 mg/dL — ABNORMAL HIGH (ref 0.61–1.24)
GFR, Estimated: 28 mL/min — ABNORMAL LOW (ref >60)
Glucose, Bld: 186 mg/dL — ABNORMAL HIGH (ref 70–99)
Potassium: 3.2 mmol/L — ABNORMAL LOW (ref 3.5–5.1)
Sodium: 138 mmol/L (ref 135–145)
Total Bilirubin: 0.8 mg/dL (ref 0.3–1.2)
Total Protein: 6.8 g/dL (ref 6.5–8.1)

## 2023-03-04 LAB — CBC
HCT: 39 % (ref 39.0–52.0)
Hemoglobin: 11.7 g/dL — ABNORMAL LOW (ref 13.0–17.0)
MCH: 29 pg (ref 26.0–34.0)
MCHC: 30 g/dL (ref 30.0–36.0)
MCV: 96.5 fL (ref 80.0–100.0)
Platelets: 182 10*3/uL (ref 150–400)
RBC: 4.04 MIL/uL — ABNORMAL LOW (ref 4.22–5.81)
RDW: 15.3 % (ref 11.5–15.5)
WBC: 6.5 10*3/uL (ref 4.0–10.5)
nRBC: 0.3 % — ABNORMAL HIGH (ref 0.0–0.2)

## 2023-03-04 LAB — TROPONIN I (HIGH SENSITIVITY)
Troponin I (High Sensitivity): 48 ng/L — ABNORMAL HIGH (ref <18)
Troponin I (High Sensitivity): 69 ng/L — ABNORMAL HIGH (ref <18)

## 2023-03-04 LAB — BRAIN NATRIURETIC PEPTIDE: B Natriuretic Peptide: 1563.8 pg/mL — ABNORMAL HIGH (ref 0.0–100.0)

## 2023-03-04 LAB — RESP PANEL BY RT-PCR (RSV, FLU A&B, COVID)  RVPGX2
Influenza A by PCR: NEGATIVE
Influenza B by PCR: NEGATIVE
Resp Syncytial Virus by PCR: NEGATIVE
SARS Coronavirus 2 by RT PCR: NEGATIVE

## 2023-03-04 LAB — MAGNESIUM: Magnesium: 1.6 mg/dL — ABNORMAL LOW (ref 1.7–2.4)

## 2023-03-04 MED ORDER — POLYETHYLENE GLYCOL 3350 17 G PO PACK: 17.0000 g | PACK | Freq: Every day | ORAL | Status: DC | PRN

## 2023-03-04 MED ORDER — PNEUMOCOCCAL 20-VAL CONJ VACC 0.5 ML IM SUSY
0.5000 mL | PREFILLED_SYRINGE | INTRAMUSCULAR | Status: AC
  Administered 2023-03-05: 0.5 mL via INTRAMUSCULAR
  Filled 2023-03-04: qty 0.5

## 2023-03-04 MED ORDER — ISOSORB DINITRATE-HYDRALAZINE 20-37.5 MG PO TABS
1.0000 | ORAL_TABLET | Freq: Three times a day (TID) | ORAL | Status: DC
  Administered 2023-03-04 – 2023-03-06 (×5): 1 via ORAL
  Filled 2023-03-04 (×5): qty 1

## 2023-03-04 MED ORDER — FUROSEMIDE 10 MG/ML IJ SOLN
120.0000 mg | Freq: Once | INTRAVENOUS | Status: AC
  Administered 2023-03-04: 120 mg via INTRAVENOUS
  Filled 2023-03-04: qty 2

## 2023-03-04 MED ORDER — ACETAMINOPHEN 325 MG PO TABS
650.0000 mg | ORAL_TABLET | Freq: Four times a day (QID) | ORAL | Status: DC | PRN
  Administered 2023-03-06 – 2023-03-10 (×4): 650 mg via ORAL
  Filled 2023-03-04 (×4): qty 2

## 2023-03-04 MED ORDER — APIXABAN 5 MG PO TABS
5.0000 mg | ORAL_TABLET | Freq: Two times a day (BID) | ORAL | Status: DC
  Administered 2023-03-04 – 2023-03-12 (×16): 5 mg via ORAL
  Filled 2023-03-04 (×16): qty 1

## 2023-03-04 MED ORDER — CARVEDILOL 12.5 MG PO TABS
12.5000 mg | ORAL_TABLET | Freq: Two times a day (BID) | ORAL | Status: DC
  Administered 2023-03-04 – 2023-03-05 (×2): 12.5 mg via ORAL
  Filled 2023-03-04 (×2): qty 1

## 2023-03-04 MED ORDER — DAPAGLIFLOZIN PROPANEDIOL 10 MG PO TABS
10.0000 mg | ORAL_TABLET | Freq: Every day | ORAL | Status: DC
  Administered 2023-03-05 – 2023-03-12 (×8): 10 mg via ORAL
  Filled 2023-03-04 (×8): qty 1

## 2023-03-04 MED ORDER — ACETAMINOPHEN 650 MG RE SUPP: 650.0000 mg | Freq: Four times a day (QID) | RECTAL | Status: DC | PRN

## 2023-03-04 MED ORDER — POTASSIUM CHLORIDE CRYS ER 20 MEQ PO TBCR
20.0000 meq | EXTENDED_RELEASE_TABLET | Freq: Once | ORAL | Status: AC
  Administered 2023-03-04: 20 meq via ORAL
  Filled 2023-03-04: qty 1

## 2023-03-04 MED ORDER — ATORVASTATIN CALCIUM 40 MG PO TABS
40.0000 mg | ORAL_TABLET | Freq: Every day | ORAL | Status: DC
  Administered 2023-03-05 – 2023-03-12 (×8): 40 mg via ORAL
  Filled 2023-03-04 (×8): qty 1

## 2023-03-04 MED ORDER — POTASSIUM CHLORIDE CRYS ER 20 MEQ PO TBCR
30.0000 meq | EXTENDED_RELEASE_TABLET | Freq: Once | ORAL | Status: AC
  Administered 2023-03-04: 30 meq via ORAL
  Filled 2023-03-04: qty 1

## 2023-03-04 MED ORDER — FUROSEMIDE 10 MG/ML IJ SOLN
80.0000 mg | Freq: Two times a day (BID) | INTRAMUSCULAR | Status: DC
  Administered 2023-03-05 – 2023-03-06 (×3): 80 mg via INTRAVENOUS
  Filled 2023-03-04 (×3): qty 8

## 2023-03-04 MED ORDER — SODIUM CHLORIDE 0.9% FLUSH
3.0000 mL | Freq: Two times a day (BID) | INTRAVENOUS | Status: DC
  Administered 2023-03-04 – 2023-03-12 (×10): 3 mL via INTRAVENOUS

## 2023-03-04 MED ORDER — AMIODARONE HCL 200 MG PO TABS
200.0000 mg | ORAL_TABLET | Freq: Every day | ORAL | Status: DC
  Administered 2023-03-05 – 2023-03-12 (×8): 200 mg via ORAL
  Filled 2023-03-04 (×8): qty 1

## 2023-03-04 NOTE — ED Triage Notes (Addendum)
Pt c/o SOB and edema in legs bilat and swelling of abdomenx3d. Pt's abdomen and distended and firm. Pt has 1+ edema of legs bilat

## 2023-03-04 NOTE — ED Provider Triage Note (Signed)
Emergency Medicine Provider Triage Evaluation Note  Richard [name] , a 52 y.o. male  was evaluated in triage.  Pt complains of shortness of breath.  Patient states that he has had worsening shortness of breath since Friday.  Patient with history of known CHF.  States he has been drinking "a little more fluid" than usual.  Reports compliance with his at home diuretic.  Denies any fever, chills, chest pain, abdominal pain, nausea, vomiting.  Does report some abdominal swelling as well as lower extremity swelling..  Review of Systems  Positive: See above Negative:   Physical Exam  BP (!) 141/114   Pulse (!) 47   Temp (!) 97.5 F (36.4 C) (Oral)   Ht 5\' 11"  (1.803 m)   Wt (!) 147.3 kg   SpO2 98%   BMI 45.29 kg/m  Gen:   Awake, no distress   Resp:  Normal effort  MSK:   Moves extremities without difficulty  Other:  Lower extremity edema.  Abdominal swelling without tenderness.    Medical Decision Making  Medically screening exam initiated at 12:41 PM.  Appropriate orders placed.  Richard [name] was informed that the remainder of the evaluation will be completed by another provider, this initial triage assessment does not replace that evaluation, and the importance of remaining in the ED until their evaluation is complete.     Peter Garter, [place] 03/04/23 1243

## 2023-03-04 NOTE — ED Provider Notes (Signed)
[facility] AT [facility] Provider Note   CSN: [id number] Arrival date & time: 03/04/23  1217     History  Chief Complaint  Patient presents with   Shortness of Breath    Marvis Saefong is a 52 y.o. male.   Shortness of Breath  52 year old male with past medical history of atrial fibrillation, CHF, CKD, hypertension presented for evaluation of shortness of breath.  Patient has noticed worsening lower extremity and abdominal swelling for the last 3 to 4 days.  He denies any obvious trigger for this.  He continues taking all medications as prescribed, including torsemide.  Denies any recent changes in diet or excess salt intake.  Denies any fever, cough, congestion, chest pain, abdominal pain, pain with urination, urinary frequency.        Home Medications Prior to Admission medications   Medication Sig Start Date End Date Taking? Authorizing Provider  acetaminophen (TYLENOL) 500 MG tablet Take 1,000 mg by mouth every 6 (six) hours as needed for moderate pain or headache.    [provider]  amiodarone (PACERONE) 400 MG tablet Take by mouth. 12/18/21   [provider]  apixaban (ELIQUIS) 5 MG TABS tablet Take 1 tablet (5 mg total) by mouth 2 (two) times daily. 03/02/22   Chandrasekhar, [place] Dragon A, MD  atorvastatin (LIPITOR) 40 MG tablet Take 1 tablet (40 mg total) by mouth daily. 11/15/21   Russella Dar, NP  carvedilol (COREG) 12.5 MG tablet TAKE 1.5 TABLETS (18.75 MG TOTAL) BY MOUTH 2 (TWO) TIMES DAILY WITH A MEAL. (NEED OFFICE VISIT) 10/24/22   Graciella Freer, PA-C  dapagliflozin propanediol (FARXIGA) 10 MG TABS tablet TAKE 1 TABLET BY MOUTH EVERY DAY 05/30/22   Chandrasekhar, Mahesh A, MD  isosorbide-hydrALAZINE (BIDIL) 20-37.5 MG tablet Take 1 tablet by mouth 3 (three) times daily. 12/11/21   Jacklynn Ganong, FNP  KLOR-CON M20 20 MEQ tablet TAKE 1 TABLET BY MOUTH TWICE A DAY 01/15/22   Camnitz, Andree Coss, MD  Multiple  Vitamins-Minerals (MULTIVITAMIN GUMMIES ADULTS PO) Take 2 tablets by mouth daily.    [provider]  Omega-3 Fatty Acids (FISH OIL) 1000 MG CAPS Take 1,000 mg by mouth daily.    [provider]  sacubitril-valsartan (ENTRESTO) 24-26 MG Take 1 tablet by mouth 2 (two) times daily. 09/04/22   Graciella Freer, PA-C  torsemide (DEMADEX) 20 MG tablet TAKE 2 TABLETS (40 MG TOTAL) BY MOUTH DAILY. 11/26/22   Allayne Butcher, PA-C      Allergies    Patient has no known allergies.    Review of Systems   Review of Systems  Respiratory:  Positive for shortness of breath.     Physical Exam Updated Vital Signs BP (!) 141/114   Pulse (!) 47   Temp (!) 97.5 F (36.4 C) (Oral)   Ht 5\' 11"  (1.803 m)   Wt (!) 147.3 kg   SpO2 98%   BMI 45.29 kg/m  Physical Exam Constitutional:      General: He is not in acute distress. HENT:     Head: Normocephalic.     Mouth/Throat:     Mouth: Mucous membranes are moist.  Eyes:     Extraocular Movements: Extraocular movements intact.  Cardiovascular:     Rate and Rhythm: Normal rate. Rhythm irregular.  Pulmonary:     Effort: Pulmonary effort is normal.     Breath sounds: Rhonchi present. No wheezing.  Chest:     Chest wall:  No mass or tenderness.  Abdominal:     Palpations: Abdomen is soft.     Tenderness: There is no guarding or rebound.     Comments: Distended abdomen with caput medusa veins  Musculoskeletal:     Right lower leg: Edema present.     Left lower leg: Edema present.  Skin:    General: Skin is warm and dry.     Findings: No rash.  Neurological:     General: No focal deficit present.     Mental Status: He is alert.     ED Results / Procedures / Treatments   Labs (all labs ordered are listed, but only abnormal results are displayed) Labs Reviewed  COMPREHENSIVE METABOLIC PANEL - Abnormal; Notable for the following components:      Result Value   Potassium 3.2 (*)    CO2 20 (*)    Glucose, Bld 186  (*)    BUN 25 (*)    Creatinine, Ser 2.67 (*)    Calcium 8.3 (*)    Albumin 2.9 (*)    GFR, Estimated 28 (*)    All other components within normal limits  CBC - Abnormal; Notable for the following components:   RBC 4.04 (*)    Hemoglobin 11.7 (*)    nRBC 0.3 (*)    All other components within normal limits  BRAIN NATRIURETIC PEPTIDE - Abnormal; Notable for the following components:   B Natriuretic Peptide 1,563.8 (*)    All other components within normal limits  TROPONIN I (HIGH SENSITIVITY) - Abnormal; Notable for the following components:   Troponin I (High Sensitivity) 48 (*)    All other components within normal limits  RESP PANEL BY RT-PCR (RSV, FLU A&B, COVID)  [id number]  TROPONIN I (HIGH SENSITIVITY)    EKG EKG Interpretation Date/Time:  Monday March 04 2023 12:14:05 EDT Ventricular Rate:  93 PR Interval:  188 QRS Duration:  84 QT Interval:  348 QTC Calculation: 432 R Axis:   118  Text Interpretation: Sinus rhythm with marked sinus arrhythmia with occasional and consecutive Premature ventricular complexes Low voltage QRS Lateral infarct , age undetermined Abnormal ECG When compared with ECG of 01-Mar-2022 16:05, Poor data quality in current ECG precludes serial comparison Confirmed by Meridee Score [phone]) on 03/04/2023 3:08:37 PM  Radiology No results found.  Procedures Procedures    Medications Ordered in ED Medications - No data to display  ED Course/ Medical Decision Making/ A&P                                 Medical Decision Making Amount and/or Complexity of Data Reviewed Labs: ordered.  Risk Prescription drug management. Decision regarding hospitalization.   Ygnacio Fecteau is a 52 y.o. male with history of history of heart failure, presenting with likely acute decompensated heart failure causing volume overload and pulmonary edema.     I have reviewed the nursing documentation for past medical history, family history, and social history and  agree.   I have reviewed the patient's vital signs, which demonstrate mild hypertension of 141/114.  Initial recorded pulse rate was 47, when I was in the room with patient he was gradually between 81-100 and in an irregular rhythm (known persistent atrial fibrillation).Marland Kitchen  Upon initial evaluation, the patient is in no acute respiratory distress.  He does have 1+ lower extremity edema as well as distention of the abdomen.  Mild rhonchi and crackles  heard on lung exam.   Labs/Imaging:  Chest x-ray demonstrates cardiomegaly as well as mild bilateral pulmonary interstitial edema with blunting of the costophrenic angles bilaterally.  On metabolic panel, patient has a mild AKI on CKD with a creatinine of 2.67.  His potassium is also low at 3.2 for which she was given replacement.  Patient has no evidence of leukocytosis.  Hemoglobin is stable at 11.7.  Patient's initial troponin was 48, repeat 69.  These actually are much lower than values from several months ago.  Low suspicion for ACS.  His BNP is elevated at greater than 1500.  DDX considered:   The etiology of the decompensation is not certain, but may be related to dietary lifestyle changes.  Alternative etiologies I considered include cardiac (ACS, valvular disease, arrhythmia, myocarditis/endocarditis, dissection) however given troponin (below recent values), EKG, and cardiac exam I have low suspicion. Also considered but low risk for respiratory cause (COPD, asthma, PE, or PNA), alcohol or drug abuse, endocrine (thyrotoxicosis), and anemia.   Interventions:  The patient was given lasix for diuresis.    Post-ED Care:  The patient was admitted for acute management of ADHF.     The plan for this patient was discussed with my attending physician, who voiced agreement and who oversaw evaluation and treatment of this patient.    Note: Chief Executive Officer was used in the creation of this note.    Final Clinical Impression(s) / ED  Diagnoses Final diagnoses:  None    Rx / DC Orders ED Discharge Orders     None         Lyman Speller, MD 03/04/23 [facility] [place]    Gerhard Munch, MD 03/05/23 2154

## 2023-03-04 NOTE — H&P (Signed)
History and Physical   Richard [name] LKG:[id number] DOB: 1970/08/21 DOA: 03/04/2023  PCP: Pcp, No   Patient coming from: Home  Chief Complaint: Shortness of breath  HPI: Richard [name] is a 52 y.o. male with medical history significant of hypertension, prediabetes, CVA, atrial fibrillation, CKD 3B, obesity, CHF presenting with worsening shortness of breath.  Patient reports 3 days of worsening shortness of breath and lower extremity edema and now with some abdominal distention despite taking all of his home heart failure medications as prescribed.  He denies fevers, chills, chest pain, abdominal pain, constipation, diarrhea, nausea, vomiting.  ED Course: Vital signs in the ED notable for blood pressure in the 140s to 170 systolic, heart rate in the 40s to 80s, respiratory rate in the 20s to 30s, saturating well on room air.  Lab workup included CMP with potassium 3.3, bicarb 20, BUN 25, creatinine elevated to 2.67 from baseline of 2, glucose 186, calcium 8.3, albumin 2.9.  CBC with hemoglobin of 11.7 which is down from fourteen 1 year ago.  Troponin mildly elevated at 48, repeat pending.  BNP significantly elevated at 1563.  Respiratory panel for flu COVID RSV pending.  Chest x-ray showed patchy opacities consistent with atelectasis versus infection.  Feel edema is also on this differential and based on clinical presentation seems most likely.  Patient received 20 mEq p.o. potassium and 120 mg IV Lasix.  Review of Systems: As per HPI otherwise all other systems reviewed and are negative.  Past Medical History:  Diagnosis Date   Acute decompensated heart failure (HCC) 01/03/2013   Allergy    CHF (congestive heart failure) (HCC)    systolic   Chronic kidney disease    CKD (chronic kidney disease) stage 2, GFR 60-89 ml/min 01/04/2013   DM (diabetes mellitus) (HCC) 01/06/2013   Hypertension    hx htn emergency   Obesity    Other secondary pulmonary hypertension (HCC) 12/19/2021   Stroke  [facility])     Past Surgical History:  Procedure Laterality Date   BUBBLE STUDY  05/22/2019   Procedure: BUBBLE STUDY;  Surgeon: Chilton Si, MD;  Location: [facility] ENDOSCOPY;  Service: Cardiovascular;;   CARDIOVERSION N/A 11/13/2021   Procedure: CARDIOVERSION;  Surgeon: Chilton Si, MD;  Location: [facility] ENDOSCOPY;  Service: Cardiovascular;  Laterality: N/A;   CARDIOVERSION N/A 11/30/2021   Procedure: CARDIOVERSION;  Surgeon: Wendall Stade, MD;  Location: [facility] ENDOSCOPY;  Service: Cardiovascular;  Laterality: N/A;   LEFT AND RIGHT HEART CATHETERIZATION WITH CORONARY ANGIOGRAM N/A 01/07/2013   Procedure: LEFT AND RIGHT HEART CATHETERIZATION WITH CORONARY ANGIOGRAM;  Surgeon: Ricki Rodriguez, MD;  Location: [facility] CATH LAB;  Service: Cardiovascular;  Laterality: N/A;   TEE WITHOUT CARDIOVERSION N/A 05/22/2019   Procedure: TRANSESOPHAGEAL ECHOCARDIOGRAM (TEE);  Surgeon: Chilton Si, MD;  Location: [facility] ENDOSCOPY;  Service: Cardiovascular;  Laterality: N/A;   TEE WITHOUT CARDIOVERSION N/A 11/13/2021   Procedure: TRANSESOPHAGEAL ECHOCARDIOGRAM (TEE);  Surgeon: Chilton Si, MD;  Location: [facility] ENDOSCOPY;  Service: Cardiovascular;  Laterality: N/A;    Social History  reports that he has never smoked. He has never used smokeless tobacco. He reports that he does not currently use alcohol. He reports that he does not use drugs.  No Known Allergies  Family History  Problem Relation Age of Onset   Heart disease Father 46       MI   Hypertension Father    Hyperlipidemia Father    Diabetes Mother    Hypertension Mother   Reviewed on admission  Prior to Admission medications   Medication Sig Start Date End Date Taking? Authorizing Provider  acetaminophen (TYLENOL) 500 MG tablet Take 1,000 mg by mouth every 6 (six) hours as needed for moderate pain or headache.    [provider]  amiodarone (PACERONE) 400 MG tablet Take by mouth. 12/18/21   [provider]  apixaban (ELIQUIS) 5  MG TABS tablet Take 1 tablet (5 mg total) by mouth 2 (two) times daily. 03/02/22   Chandrasekhar, [place] Dragon A, MD  atorvastatin (LIPITOR) 40 MG tablet Take 1 tablet (40 mg total) by mouth daily. 11/15/21   Russella Dar, NP  carvedilol (COREG) 12.5 MG tablet TAKE 1.5 TABLETS (18.75 MG TOTAL) BY MOUTH 2 (TWO) TIMES DAILY WITH A MEAL. (NEED OFFICE VISIT) 10/24/22   Graciella Freer, PA-C  dapagliflozin propanediol (FARXIGA) 10 MG TABS tablet TAKE 1 TABLET BY MOUTH EVERY DAY 05/30/22   Chandrasekhar, Mahesh A, MD  isosorbide-hydrALAZINE (BIDIL) 20-37.5 MG tablet Take 1 tablet by mouth 3 (three) times daily. 12/11/21   Jacklynn Ganong, FNP  KLOR-CON M20 20 MEQ tablet TAKE 1 TABLET BY MOUTH TWICE A DAY 01/15/22   Camnitz, Andree Coss, MD  Multiple Vitamins-Minerals (MULTIVITAMIN GUMMIES ADULTS PO) Take 2 tablets by mouth daily.    [provider]  Omega-3 Fatty Acids (FISH OIL) 1000 MG CAPS Take 1,000 mg by mouth daily.    [provider]  sacubitril-valsartan (ENTRESTO) 24-26 MG Take 1 tablet by mouth 2 (two) times daily. 09/04/22   Graciella Freer, PA-C  torsemide (DEMADEX) 20 MG tablet TAKE 2 TABLETS (40 MG TOTAL) BY MOUTH DAILY. 11/26/22   Allayne Butcher, PA-C    Physical Exam: Vitals:   03/04/23 1552 03/04/23 1600 03/04/23 1614 03/04/23 1630  BP: (!) 161/120 (!) 179/130  (!) 174/118  Pulse: 86 85    Resp: (!) 31 (!) 24  (!) 36  Temp:   97.8 F (36.6 C)   TempSrc:   Oral   SpO2: 97% 98%  99%  Weight:      Height:        Physical Exam Constitutional:      General: He is not in acute distress.    Appearance: Normal appearance. He is obese.  HENT:     Head: Normocephalic and atraumatic.     Mouth/Throat:     Mouth: Mucous membranes are moist.     Pharynx: Oropharynx is clear.  Eyes:     Extraocular Movements: Extraocular movements intact.     Pupils: Pupils are equal, round, and reactive to light.  Cardiovascular:     Rate and Rhythm: Normal rate  and regular rhythm.     Pulses: Normal pulses.     Heart sounds: Normal heart sounds.  Pulmonary:     Effort: Pulmonary effort is normal. No respiratory distress.     Breath sounds: Rales (mild and distant) present.  Abdominal:     General: Bowel sounds are normal. There is no distension.     Palpations: Abdomen is soft.     Tenderness: There is no abdominal tenderness.  Musculoskeletal:        General: No swelling or deformity.     Right lower leg: Edema present.     Left lower leg: Edema present.  Skin:    General: Skin is warm and dry.  Neurological:     General: No focal deficit present.     Mental Status: Mental status is at baseline.    Labs  on Admission: I have personally reviewed following labs and imaging studies  CBC: Recent Labs  Lab 03/04/23 1253  WBC 6.5  HGB 11.7*  HCT 39.0  MCV 96.5  PLT 182    Basic Metabolic Panel: Recent Labs  Lab 03/04/23 1253  NA 138  K 3.2*  CL 106  CO2 20*  GLUCOSE 186*  BUN 25*  CREATININE 2.67*  CALCIUM 8.3*    GFR: Estimated Creatinine Clearance: 47.7 mL/min (A) (by C-G formula based on SCr of 2.67 mg/dL (H)).  Liver Function Tests: Recent Labs  Lab 03/04/23 1253  AST 20  ALT 18  ALKPHOS 38  BILITOT 0.8  PROT 6.8  ALBUMIN 2.9*    Urine analysis:    Component Value Date/Time   APPEARANCEUR Clear 06/10/2019 1014   GLUCOSEU Negative 06/10/2019 1014   BILIRUBINUR Negative 06/10/2019 1014   PROTEINUR 2+ (A) 06/10/2019 1014   UROBILINOGEN 1.0 01/03/2013 1809   NITRITE Negative 06/10/2019 1014   LEUKOCYTESUR Negative 06/10/2019 1014    Radiological Exams on Admission: DG Chest 1 View  Result Date: 03/04/2023 CLINICAL DATA:  Patient with shortness of breath and chest pain. EXAM: CHEST  1 VIEW COMPARISON:  Chest radiograph 11/09/2021 FINDINGS: Cardiomegaly. Patchy opacities within the right mid lung in left lung base. No pleural effusion or pneumothorax. Thoracic spine degenerative changes. IMPRESSION:  Patchy opacities within the right mid lung and left lung base may represent atelectasis or infection. Electronically Signed   By: Annia Belt M.D.   On: 03/04/2023 15:18    EKG: Independently reviewed.  Atrial fibrillation at 93 bpm.  Multiple PVCs including a couplet.  Baseline wander and some baseline artifact as well.  Assessment/Plan Active Problems:   Essential hypertension, benign   Disorder associated with well-controlled type 2 diabetes mellitus (HCC)   Stage 3b chronic kidney disease (HCC)   HFrEF (heart failure with reduced ejection fraction) (HCC)   History of CVA (cerebrovascular accident)   Obesity, diabetes, and hypertension syndrome (HCC)   Persistent atrial fibrillation (HCC)   Acute on chronic systolic (congestive) heart failure (HCC)   Acute on chronic systolic CHF > Last echo was in 2023 with EF 25-30%, global hypokinesis, indeterminate diastolic function, normal RV function. > Does follow with cardiology.  Patient states he has been taking his home medications.  Last cardiology note did mention some medication adherence issues including missing doses of beta-blocker and missing one of his 3 BiDil doses frequently. > 3 days of worsening shortness of breath, edema and now some abdominal distention.  BNP elevated to 1563 in ED.  Chest x-ray suspicious for edema. > Received 120 mg IV Lasix in the ED. - Monitor on progressive overnight - Continue with Lasix at 80 mg IV twice daily - Strict I's and O's, daily weights - Echocardiogram - Check magnesium - Trend renal function and electrolytes - Continue Entresto, carvedilol, BiDil - Continue home Farxiga  Hypokalemia AKI on CKD 3B > Potassium noted to be 3.3 in the ED.  Creatinine elevated to 2.67 from baseline of 2.  In the setting of volume overload as above. > Did receive 20 mEq p.o. potassium in the ED. - Monitor response to diuresis - Add additional 40 mEq p.o. potassium - Check magnesium  Atrial fibrillation -  Continue amiodarone, Eliquis - Continue home Coreg  Hypertension - Continue home Entresto, carvedilol, BiDil - On Lasix as above  History of CVA - Continue home Eliquis, atorvastatin  Obesity - Noted  DVT prophylaxis: Eliquis Code  Status:   Full Family Communication:  None on admission  Disposition Plan:   Patient is from:  Home  Anticipated DC to:  Home  Anticipated DC date:  2 to 4 days  Anticipated DC barriers: None  Consults called:  None Admission status:  Inpatient, progressive  Severity of Illness: The appropriate patient status for this patient is INPATIENT. Inpatient status is judged to be reasonable and necessary in order to provide the required intensity of service to ensure the patient's safety. The patient's presenting symptoms, physical exam findings, and initial radiographic and laboratory data in the context of their chronic comorbidities is felt to place them at high risk for further clinical deterioration. Furthermore, it is not anticipated that the patient will be medically stable for discharge from the hospital within 2 midnights of admission.   * I certify that at the point of admission it is my clinical judgment that the patient will require inpatient hospital care spanning beyond 2 midnights from the point of admission due to high intensity of service, high risk for further deterioration and high frequency of surveillance required.Synetta Fail MD [facility]  How to contact the [facility] Attending or Consulting provider 7A - 7P or covering provider during after hours 7P -7A, for this patient?   Check the care team in [facility] and look for a) attending/consulting TR[facility] provider listed and b) the [facility] team listed Log into [email or web address] and use [place]'s universal password to access. If you do not have the password, please contact the hospital operator. Locate the [facility] provider you are looking for under [facility] and page to a number that you  can be directly reached. If you still have difficulty reaching the provider, please page the [facility] (Director on Call) for the Hospitalists listed on amion for assistance.  03/04/2023, 5:02 PM

## 2023-03-05 ENCOUNTER — Inpatient Hospital Stay (HOSPITAL_COMMUNITY): Payer: Self-pay

## 2023-03-05 ENCOUNTER — Other Ambulatory Visit (HOSPITAL_COMMUNITY): Payer: Self-pay

## 2023-03-05 DIAGNOSIS — I5021 Acute systolic (congestive) heart failure: Secondary | ICD-10-CM

## 2023-03-05 LAB — HIV ANTIBODY (ROUTINE TESTING W REFLEX): HIV Screen 4th Generation wRfx: NONREACTIVE

## 2023-03-05 LAB — COMPREHENSIVE METABOLIC PANEL
ALT: 20 U/L (ref 0–44)
AST: 18 U/L (ref 15–41)
Albumin: 2.8 g/dL — ABNORMAL LOW (ref 3.5–5.0)
Alkaline Phosphatase: 34 U/L — ABNORMAL LOW (ref 38–126)
Anion gap: 10 (ref 5–15)
BUN: 25 mg/dL — ABNORMAL HIGH (ref 6–20)
CO2: 25 mmol/L (ref 22–32)
Calcium: 8.2 mg/dL — ABNORMAL LOW (ref 8.9–10.3)
Chloride: 103 mmol/L (ref 98–111)
Creatinine, Ser: 2.67 mg/dL — ABNORMAL HIGH (ref 0.61–1.24)
GFR, Estimated: 28 mL/min — ABNORMAL LOW (ref >60)
Glucose, Bld: 176 mg/dL — ABNORMAL HIGH (ref 70–99)
Potassium: 3.7 mmol/L (ref 3.5–5.1)
Sodium: 138 mmol/L (ref 135–145)
Total Bilirubin: 1.2 mg/dL (ref 0.3–1.2)
Total Protein: 6.7 g/dL (ref 6.5–8.1)

## 2023-03-05 LAB — HEMOGLOBIN A1C
Hgb A1c MFr Bld: 8.4 % — ABNORMAL HIGH (ref 4.8–5.6)
Mean Plasma Glucose: 194.38 mg/dL

## 2023-03-05 LAB — ECHOCARDIOGRAM COMPLETE
Area-P 1/2: 4.49 cm2
Est EF: 25
Height: 71 in
S' Lateral: 5.8 cm
Weight: 5195.8 [oz_av]

## 2023-03-05 LAB — CBC
HCT: 37.9 % — ABNORMAL LOW (ref 39.0–52.0)
Hemoglobin: 11.6 g/dL — ABNORMAL LOW (ref 13.0–17.0)
MCH: 29.8 pg (ref 26.0–34.0)
MCHC: 30.6 g/dL (ref 30.0–36.0)
MCV: 97.4 fL (ref 80.0–100.0)
Platelets: 180 10*3/uL (ref 150–400)
RBC: 3.89 MIL/uL — ABNORMAL LOW (ref 4.22–5.81)
RDW: 15.4 % (ref 11.5–15.5)
WBC: 5.9 10*3/uL (ref 4.0–10.5)
nRBC: 0.3 % — ABNORMAL HIGH (ref 0.0–0.2)

## 2023-03-05 MED ORDER — CARVEDILOL 3.125 MG PO TABS
3.1250 mg | ORAL_TABLET | Freq: Two times a day (BID) | ORAL | Status: DC
  Administered 2023-03-05 – 2023-03-06 (×2): 3.125 mg via ORAL
  Filled 2023-03-05 (×2): qty 1

## 2023-03-05 MED ORDER — SODIUM CHLORIDE 0.9 % IV SOLN: INTRAVENOUS | Status: DC | PRN

## 2023-03-05 MED ORDER — POTASSIUM CHLORIDE CRYS ER 20 MEQ PO TBCR
40.0000 meq | EXTENDED_RELEASE_TABLET | Freq: Once | ORAL | Status: AC
  Administered 2023-03-05: 40 meq via ORAL
  Filled 2023-03-05: qty 2

## 2023-03-05 MED ORDER — PERFLUTREN LIPID MICROSPHERE
1.0000 mL | INTRAVENOUS | Status: AC | PRN
  Administered 2023-03-05: 3 mL via INTRAVENOUS
  Filled 2023-03-05: qty 10

## 2023-03-05 MED ORDER — MAGNESIUM SULFATE 2 GM/50ML IV SOLN
2.0000 g | Freq: Once | INTRAVENOUS | Status: AC
  Administered 2023-03-05: 2 g via INTRAVENOUS
  Filled 2023-03-05: qty 50

## 2023-03-05 NOTE — Plan of Care (Signed)
  Problem: Education: Goal: Ability to demonstrate management of disease process will improve Outcome: Progressing   Problem: Activity: Goal: Risk for activity intolerance will decrease Outcome: Progressing   Problem: Elimination: Goal: Will not experience complications related to urinary retention Outcome: Progressing   Problem: Safety: Goal: Ability to remain free from injury will improve Outcome: Progressing

## 2023-03-05 NOTE — ED Notes (Addendum)
Ed hand History of Present Illness   Patient Identification Richard [name] is a 52 y.o. male.  Patient information was obtained from patient. History/Exam limitations: none. Patient presented to the Emergency Department   Chief Complaint  Shortness of Breath    Past Medical History:  Diagnosis Date   Acute decompensated heart failure (HCC) 01/03/2013   Allergy    CHF (congestive heart failure) (HCC)    systolic   Chronic kidney disease    CKD (chronic kidney disease) stage 2, GFR 60-89 ml/min 01/04/2013   DM (diabetes mellitus) (HCC) 01/06/2013   Hypertension    hx htn emergency   Obesity    Other secondary pulmonary hypertension (HCC) 12/19/2021   Stroke (HCC)    Family History  Problem Relation Age of Onset   Heart disease Father 30       MI   Hypertension Father    Hyperlipidemia Father    Diabetes Mother    Hypertension Mother    Current Facility-Administered Medications  Medication Dose Route Frequency Provider Last Rate Last Admin   acetaminophen (TYLENOL) tablet 650 mg  650 mg Oral Q6H PRN Synetta Fail, MD       Or   acetaminophen (TYLENOL) suppository 650 mg  650 mg Rectal Q6H PRN Synetta Fail, MD       amiodarone (PACERONE) tablet 200 mg  200 mg Oral Daily Synetta Fail, MD   200 mg at 03/05/23 6440   apixaban (ELIQUIS) tablet 5 mg  5 mg Oral BID Synetta Fail, MD   5 mg at 03/05/23 3474   atorvastatin (LIPITOR) tablet 40 mg  40 mg Oral Daily Synetta Fail, MD   40 mg at 03/05/23 0826   carvedilol (COREG) tablet 3.125 mg  3.125 mg Oral BID WC Zannie Cove, MD       dapagliflozin propanediol (FARXIGA) tablet 10 mg  10 mg Oral Daily Synetta Fail, MD   10 mg at 03/05/23 2595   furosemide (LASIX) injection 80 mg  80 mg Intravenous BID Synetta Fail, MD   80 mg at 03/05/23 0826   isosorbide-hydrALAZINE (BIDIL) 20-37.5 MG per tablet 1 tablet  1 tablet Oral TID Synetta Fail, MD   1 tablet at 03/05/23 0826    polyethylene glycol (MIRALAX / GLYCOLAX) packet 17 g  17 g Oral Daily PRN Synetta Fail, MD       sodium chloride flush (NS) 0.9 % injection 3 mL  3 mL Intravenous Q12H Synetta Fail, MD   3 mL at 03/05/23 6387   Current Outpatient Medications  Medication Sig Dispense Refill   acetaminophen (TYLENOL) 500 MG tablet Take 1,000 mg by mouth every 6 (six) hours as needed for moderate pain or headache.     amiodarone (PACERONE) 400 MG tablet Take by mouth.     atorvastatin (LIPITOR) 40 MG tablet Take 1 tablet (40 mg total) by mouth daily. 90 tablet 0   carvedilol (COREG) 12.5 MG tablet TAKE 1.5 TABLETS (18.75 MG TOTAL) BY MOUTH 2 (TWO) TIMES DAILY WITH A MEAL. (NEED OFFICE VISIT) 90 tablet 2   ELDERBERRY PO Take 1 tablet by mouth daily.     isosorbide-hydrALAZINE (BIDIL) 20-37.5 MG tablet Take 1 tablet by mouth 3 (three) times daily. 90 tablet 3   KLOR-CON M20 20 MEQ tablet TAKE 1 TABLET BY MOUTH TWICE A DAY 180 tablet 1   Multiple Vitamins-Minerals (MULTIVITAMIN GUMMIES ADULTS PO) Take 2 tablets by mouth daily.  Multiple Vitamins-Minerals (ZINC PO) Take 1 tablet by mouth daily.     Omega-3 Fatty Acids (FISH OIL) 1000 MG CAPS Take 1,000 mg by mouth daily.     sacubitril-valsartan (ENTRESTO) 24-26 MG Take 1 tablet by mouth 2 (two) times daily. 60 tablet 11   torsemide (DEMADEX) 20 MG tablet TAKE 2 TABLETS (40 MG TOTAL) BY MOUTH DAILY. 180 tablet 3   TURMERIC PO Take 1 tablet by mouth daily.     apixaban (ELIQUIS) 5 MG TABS tablet Take 1 tablet (5 mg total) by mouth 2 (two) times daily. (Patient not taking: Reported on 03/04/2023) 60 tablet 5   dapagliflozin propanediol (FARXIGA) 10 MG TABS tablet TAKE 1 TABLET BY MOUTH EVERY DAY (Patient not taking: Reported on 03/04/2023) 90 tablet 1   No Known Allergies Social History   Socioeconomic History   Marital status: Single    Spouse name: Not on file   Number of children: 0   Years of education: Not on file   Highest education level:  Bachelor's degree (e.g., BA, AB, BS)  Occupational History   Occupation: English as a second language teacher  Tobacco Use   Smoking status: Never   Smokeless tobacco: Never  Vaping Use   Vaping status: Never Used  Substance and Sexual Activity   Alcohol use: Not Currently   Drug use: No   Sexual activity: Not on file  Other Topics Concern   Not on file  Social History Narrative   Work or School: Technical sales engineer, Investment banker, corporate      Home Situation: lives alone      Spiritual Beliefs: Christian      Lifestyle: 15 minutes dialy walking - working up; working on diet            Social Determinants of Corporate investment banker Strain: Low Risk  (11/13/2021)   Overall Financial Resource Strain (CARDIA)    Difficulty of Paying Living Expenses: Not very hard  Food Insecurity: No Food Insecurity (03/04/2023)   Hunger Vital Sign    Worried About Running Out of Food in the Last Year: Never true    Ran Out of Food in the Last Year: Never true  Transportation Needs: No Transportation Needs (03/04/2023)   PRAPARE - Administrator, Civil Service (Medical): No    Lack of Transportation (Non-Medical): No  Physical Activity: Sufficiently Active (11/24/2021)   Exercise Vital Sign    Days of Exercise per Week: 7 days    Minutes of Exercise per Session: 30 min  Stress: No Stress Concern Present (11/24/2021)   [employer] of Occupational Health - Occupational Stress Questionnaire    Feeling of Stress : Only a little  Social Connections: Moderately Integrated (11/24/2021)   Social Connection and Isolation Panel [NHANES]    Frequency of Communication with Friends and Family: More than three times a week    Frequency of Social Gatherings with Friends and Family: Three times a week    Attends Religious Services: More than 4 times per year    Active Member of Clubs or Organizations: Yes    Attends Banker Meetings: More than 4 times per year    Marital Status:  Never married  Intimate Partner Violence: Not At Risk (03/04/2023)   Humiliation, Afraid, Rape, and Kick questionnaire    Fear of Current or Ex-Partner: No    Emotionally Abused: No    Physically Abused: No    Sexually Abused: No  Physical Exam   BP 108/77   Pulse 84   Temp 97.8 F (36.6 C) (Oral)   Resp (!) 26   Ht 5\' 11"  (1.803 m)   Wt (!) 147.3 kg   SpO2 97%   BMI 45.29 kg/m    ED Course   Studies: Lab:  Imaging: Initial Electrocardiogram:  Repeat Electrocardiogram:    Records Reviewed: Nursing notes.  Treatments:   Consultations: Disposition:

## 2023-03-05 NOTE — Progress Notes (Addendum)
PROGRESS NOTE    Richard [name]  WJX:[id number] DOB: [birth year] DOA: 03/04/2023 PCP: Pcp, No   51/M w severe biventricular failure EF less than 20% with severely reduced RV on TEE 6/23, CKD 3B hypertension, prediabetes, CVA, atrial fibrillation, OSA, obesity, presented with worsening shortness of breath. - reported 3 days of progressive shortness of breath, lower extremity edema, abdominal distention, reports compliance with meds -In the ED, tachypneic, vital signs stable -Labs noted creatinine of 2.6, BNP 1563, troponin 48, hemoglobin 11, EKG with sinus arrhythmia, chest x-ray with patchy opacities at right and left lung base    Subjective: -Tired, short of breath, swollen  Assessment and Plan:  Acute on chronic biventricular failure -Last echo was TEE 6/23 with EF less than 20%, severely reduced RV -Appears significantly volume overloaded, has 3+ edema in upper thighs, flank and abdomen -Continue Lasix 80 Mg twice daily, 1.4 L negative -Will request heart failure team input -GDMT limited by CKD, hold Entresto and decrease Coreg,  -continue Farxiga, bidil -Will request CHF team input  AKI on CKD 3B -Baseline creatinine around 1.6-2, now 2.6 with volume overload -Cardiorenal, diuretics as above, hold Entresto, avoid hypotension  pAtrial fibrillation -Appears to be in sinus rhythm with PACs continue amiodarone, Eliquis -Cut down Coreg dose  History of CVA - Continue home Eliquis, atorvastatin   Morbid obesity Suspected OSA/OHS -Will need sleep study and CPAP titration  Hyperglycemia -Check HbA1c, continue Farxiga -Reports history of prediabetes  Hypoalbuminemia -Could have liver disease from RV failure or NASH  Mild anemia -Stable, trend  DVT prophylaxis: Eliquis Code Status: Full code Family Communication: None present Disposition Plan: Home pending improvement in volume status  Consultants:    Procedures:   Antimicrobials:    Objective: Vitals:    03/05/23 0825 03/05/23 0900 03/05/23 1000 03/05/23 1030  BP: (!) 145/112 (!) 127/99 122/86 108/89  Pulse: 96 89 84 72  Resp: (!) 22 (!) 27 (!) 23 (!) 32  Temp: 97.8 F (36.6 C)     TempSrc: Oral     SpO2: 98% 97% 94% 97%  Weight:      Height:        Intake/Output Summary (Last 24 hours) at 03/05/2023 1112 Last data filed at 03/05/2023 1108 Gross per 24 hour  Intake 425 ml  Output 2850 ml  Net -2425 ml   Filed Weights   03/04/23 1229  Weight: (!) 147.3 kg    Examination:  General exam: Appears calm and comfortable  Respiratory system: Clear to auscultation Cardiovascular system: S1 & S2 heard, RRR.  Abd: nondistended, soft and nontender.Normal bowel sounds heard. Central nervous system: Alert and oriented. No focal neurological deficits. Extremities: no edema Skin: No rashes Psychiatry:  Mood & affect appropriate.     Data Reviewed:   CBC: Recent Labs  Lab 03/04/23 1253 03/05/23 0225  WBC 6.5 5.9  HGB 11.7* 11.6*  HCT 39.0 37.9*  MCV 96.5 97.4  PLT 182 180   Basic Metabolic Panel: Recent Labs  Lab 03/04/23 1253 03/04/23 2244 03/05/23 0225  NA 138  --  138  K 3.2*  --  3.7  CL 106  --  103  CO2 20*  --  25  GLUCOSE 186*  --  176*  BUN 25*  --  25*  CREATININE 2.67*  --  2.67*  CALCIUM 8.3*  --  8.2*  MG  --  1.6*  --    GFR: Estimated Creatinine Clearance: 47.7 mL/min (A) (by C-G formula based on  SCr of 2.67 mg/dL (H)). Liver Function Tests: Recent Labs  Lab 03/04/23 1253 03/05/23 0225  AST 20 18  ALT 18 20  ALKPHOS 38 34*  BILITOT 0.8 1.2  PROT 6.8 6.7  ALBUMIN 2.9* 2.8*   No results for input(s): "LIPASE", "AMYLASE" in the last 168 hours. No results for input(s): "AMMONIA" in the last 168 hours. Coagulation Profile: No results for input(s): "INR", "PROTIME" in the last 168 hours. Cardiac Enzymes: No results for input(s): "CKTOTAL", "CKMB", "CKMBINDEX", "TROPONINI" in the last 168 hours. BNP (last 3 results) No results for input(s):  "PROBNP" in the last 8760 hours. HbA1C: No results for input(s): "HGBA1C" in the last 72 hours. CBG: No results for input(s): "GLUCAP" in the last 168 hours. Lipid Profile: No results for input(s): "CHOL", "HDL", "LDLCALC", "TRIG", "CHOLHDL", "LDLDIRECT" in the last 72 hours. Thyroid Function Tests: No results for input(s): "TSH", "T4TOTAL", "FREET4", "T3FREE", "THYROIDAB" in the last 72 hours. Anemia Panel: No results for input(s): "VITAMINB12", "FOLATE", "FERRITIN", "TIBC", "IRON", "RETICCTPCT" in the last 72 hours. Urine analysis:    Component Value Date/Time   APPEARANCEUR Clear 06/10/2019 1014   GLUCOSEU Negative 06/10/2019 1014   BILIRUBINUR Negative 06/10/2019 1014   PROTEINUR 2+ (A) 06/10/2019 1014   UROBILINOGEN 1.0 01/03/2013 1809   NITRITE Negative 06/10/2019 1014   LEUKOCYTESUR Negative 06/10/2019 1014   Sepsis Labs: @LABRCNTIP (procalcitonin:4,lacticidven:4)  ) Recent Results (from the past 240 hour(s))  Resp panel by RT-PCR (RSV, Flu A&B, Covid) Anterior Nasal Swab     Status: None   Collection Time: 03/04/23  3:53 PM   Specimen: Anterior Nasal Swab  Result Value Ref Range Status   SARS Coronavirus 2 by RT PCR NEGATIVE NEGATIVE Final   Influenza A by PCR NEGATIVE NEGATIVE Final   Influenza B by PCR NEGATIVE NEGATIVE Final    Comment: (NOTE) The Xpert Xpress SARS-CoV-2/FLU/RSV plus assay is intended as an aid in the diagnosis of influenza from Nasopharyngeal swab specimens and should not be used as a sole basis for treatment. Nasal washings and aspirates are unacceptable for Xpert Xpress SARS-CoV-2/FLU/RSV testing.  Fact Sheet for Patients: BloggerCourse.com  Fact Sheet for Healthcare Providers: SeriousBroker.it  This test is not yet approved or cleared by the [place] FDA and has been authorized for detection and/or diagnosis of SARS-CoV-2 by FDA under an Emergency Use Authorization (EUA). This EUA  will remain in effect (meaning this test can be used) for the duration of the COVID-19 declaration under Section 564(b)(1) of the Act, 21 U.S.C. section 360bbb-3(b)(1), unless the authorization is terminated or revoked.     Resp Syncytial Virus by PCR NEGATIVE NEGATIVE Final    Comment: (NOTE) Fact Sheet for Patients: BloggerCourse.com  Fact Sheet for Healthcare Providers: SeriousBroker.it  This test is not yet approved or cleared by the [place] FDA and has been authorized for detection and/or diagnosis of SARS-CoV-2 by FDA under an Emergency Use Authorization (EUA). This EUA will remain in effect (meaning this test can be used) for the duration of the COVID-19 declaration under Section 564(b)(1) of the Act, 21 U.S.C. section 360bbb-3(b)(1), unless the authorization is terminated or revoked.  Performed at [facility], [address] [place], [place] [address]      Radiology Studies: DG Chest 1 View  Result Date: 03/04/2023 CLINICAL DATA:  Patient with shortness of breath and chest pain. EXAM: CHEST  1 VIEW COMPARISON:  Chest radiograph 11/09/2021 FINDINGS: Cardiomegaly. Patchy opacities within the right mid lung in left lung base.  No pleural effusion or pneumothorax. Thoracic spine degenerative changes. IMPRESSION: Patchy opacities within the right mid lung and left lung base may represent atelectasis or infection. Electronically Signed   By: Annia Belt M.D.   On: 03/04/2023 15:18     Scheduled Meds:  amiodarone  200 mg Oral Daily   apixaban  5 mg Oral BID   atorvastatin  40 mg Oral Daily   carvedilol  12.5 mg Oral BID WC   dapagliflozin propanediol  10 mg Oral Daily   furosemide  80 mg Intravenous BID   isosorbide-hydrALAZINE  1 tablet Oral TID   sodium chloride flush  3 mL Intravenous Q12H   Continuous Infusions:   LOS: 1 day    Time spent:    Zannie Cove, MD [facility]   03/05/2023, 11:12 AM

## 2023-03-05 NOTE — Progress Notes (Signed)
Arrived to [facility] Room 3E12 from Emergency Department.  Placed on cardiac monitor and vitals obtained.    03/05/23 1329  Vitals  Temp (!) 97.4 F (36.3 C)  Temp Source Oral  BP 119/81  MAP (mmHg) 94  BP Location Right Arm  BP Method Automatic  Patient Position (if appropriate) Sitting  Pulse Rate 96  Pulse Rate Source Monitor  ECG Heart Rate 77  Resp (!) 22  Level of Consciousness  Level of Consciousness Alert  MEWS COLOR  MEWS Score Color Green  Oxygen Therapy  SpO2 97 %  O2 Device Room Air  ECG Monitoring  Telemetry Box Number [id number]  Tele Box Verification Completed by Second Verifier Completed Alvira Philips RN)  MEWS Score  MEWS Temp 0  MEWS Systolic 0  MEWS Pulse 0  MEWS RR 1  MEWS LOC 0  MEWS Score 1

## 2023-03-05 NOTE — Progress Notes (Signed)
Heart Failure Navigator Progress Note  Assessed for Heart & Vascular TOC clinic readiness.  Patient does not meet criteria due to Advanced Heart Failure Team patient of Dr. McLean.   Navigator will sign off at this time.   Mahreen Schewe, BSN, RN Heart Failure Nurse Navigator Secure Chat Only   

## 2023-03-05 NOTE — Consult Note (Signed)
Advanced Heart Failure Team Consult Note   Primary Physician: Pcp, No PCP-Cardiologist:  Christell Constant, MD  Reason for Consultation: Acute on Chronic Systolic and Diastolic Heart Failure  HPI:    Burdett Pinzon is seen today for evaluation of acute on chronic systolic heart failure at the request of Dr. Jomarie Longs.   Pt is a 52 yo male w PMH of chronic combined systolic and diastolic heart failure diagnoses r/t in 4/17. NICM r/t poorly controlled HTN, CVA, AF, CKD 3B, LHC in 2014 negative for CAD. EF was 25-30% at the time with improved follow up ECHO in 2017 (50-55%). Was being followed by Dr. Shirlee Latch but lost to f/u.  In 2020, he was admitted w/ Cryptogenic stroke. 2D echo w/ normal EF, 50-55%, normal RV.  However huge discrepancy in EF by TEE. EF felt to be 20-25%, RV normal. No LV thrombus. EP was consulted for Implantable loop recorder for afib surveillance, however pt declined   He established care w/ Dr. Izora Ribas 6/22. BP was elevated. GDMT adjusted. Losartan switched to Entresto, spironolactone added. He was ordered to get repeat echo w/ plans to start SLGTi at subsequent visit, but he never followed up.    Admitted 6/23 w/ a/c CHF in the setting of new atrial fibrillation w/ RVR. Echo EF back down to 25-30%, RV normal. HS trop 466>>349. LHC not perused given renal function, SCr 2.5 on admit. He was diuresed w/ IV Lasix. Placed on Eliquis for Afib. TEE LVEF 15-20%, Aortic valve did not open with each ventricular contraction, RV systolic function severely reduced, Moderate tricuspid regurgitation, Trivial mitral regurgitation, No LA/LAA thrombus or masses. Underwent DCCV x 1 back to NSR.   Unfortunately he had ERAF. EP consulted and recommended loading w/ IV amio and reattempt DCCV after load. However pt refused to stay and opted on repeat cardioversion as an outpatient. He was transitioned to PO amiodarone 400 mg bid x 1 week then 200 mg daily thereafter. Eliquis continued.  Transitioned to PO lasix + GDMT (losartan, spiro, Farxiga and coreg).    Had outpatient f/u w/ EP on 11/21/21. Was still in Afib, 110 bpm. Amiodarone reduced to 200 mg daily w/ plans to c/w outpatient DCCV. S/p DCCV 11/30/21 to NSR.   Seen in HF Clinic  6/23, NYHA II symptoms but volume up and SCr up to 2.8. Lasix stopped, torsemide 40 mg daily started. Cleda Daub stopped with worsening renal function and low-dose BiDil added for afterload reduction.   01/31/2023 Sleep Study: Moderate OSA  Numerous cancelled or no showed appointments for both HF Clinic and EP Clinic.    Today, patient presents to the ED with increasing shortness of breath over the past 3 days and lower extremity edema and abdominal distention. Pt reports taking all his medications as prescribes (excluding Eliquis), however verified with pharmD that meds have not been filled since July 2024 and patient has been without insurance.Pt reports at 20 lb weight gain with in the past month. Denies nausea or vomiting but endorses early satiety and fatigue. Not taking any NSAIDs.  Pt is in SR in the 60-80s with PACs with SBP in the 140-170s and tachypneic on room air. Lab work included Trop 48 with a repeat of 63, BNP 1563, sCr 2.67, Bicarb 20, BUN 25, K 3.2. RSV and COVID test negative. CXR showed patchy opacities consistent with atelectasis versus infection. In the ED, patient was given 120 mg IV lasix and then started on 80 mg BID IV Lasix and  restarted on home medications for volume overload. Complete ECHO pending.    Home Medications Prior to Admission medications   Medication Sig Start Date End Date Taking? Authorizing Provider  acetaminophen (TYLENOL) 500 MG tablet Take 1,000 mg by mouth every 6 (six) hours as needed for moderate pain or headache.   Yes [provider]  amiodarone (PACERONE) 400 MG tablet Take by mouth. 12/18/21  Yes [provider]  atorvastatin (LIPITOR) 40 MG tablet Take 1 tablet (40 mg total) by mouth  daily. 11/15/21  Yes Russella Dar, NP  carvedilol (COREG) 12.5 MG tablet TAKE 1.5 TABLETS (18.75 MG TOTAL) BY MOUTH 2 (TWO) TIMES DAILY WITH A MEAL. (NEED OFFICE VISIT) 10/24/22  Yes Graciella Freer, PA-C  ELDERBERRY PO Take 1 tablet by mouth daily.   Yes [provider]  isosorbide-hydrALAZINE (BIDIL) 20-37.5 MG tablet Take 1 tablet by mouth 3 (three) times daily. 12/11/21  Yes Milford, Anderson Malta, FNP  KLOR-CON M20 20 MEQ tablet TAKE 1 TABLET BY MOUTH TWICE A DAY 01/15/22  Yes Camnitz, Will Daphine Deutscher, MD  Multiple Vitamins-Minerals (MULTIVITAMIN GUMMIES ADULTS PO) Take 2 tablets by mouth daily.   Yes [provider]  Multiple Vitamins-Minerals (ZINC PO) Take 1 tablet by mouth daily.   Yes [provider]  Omega-3 Fatty Acids (FISH OIL) 1000 MG CAPS Take 1,000 mg by mouth daily.   Yes [provider]  sacubitril-valsartan (ENTRESTO) 24-26 MG Take 1 tablet by mouth 2 (two) times daily. 09/04/22  Yes Graciella Freer, PA-C  torsemide (DEMADEX) 20 MG tablet TAKE 2 TABLETS (40 MG TOTAL) BY MOUTH DAILY. 11/26/22  Yes Simmons, Brittainy M, PA-C  TURMERIC PO Take 1 tablet by mouth daily.   Yes [provider]  apixaban (ELIQUIS) 5 MG TABS tablet Take 1 tablet (5 mg total) by mouth 2 (two) times daily. Patient not taking: Reported on 03/04/2023 03/02/22   Christell Constant, MD  dapagliflozin propanediol (FARXIGA) 10 MG TABS tablet TAKE 1 TABLET BY MOUTH EVERY DAY Patient not taking: Reported on 03/04/2023 05/30/22   Christell Constant, MD    Past Medical History: Past Medical History:  Diagnosis Date   Acute decompensated heart failure (HCC) 01/03/2013   Allergy    CHF (congestive heart failure) (HCC)    systolic   Chronic kidney disease    CKD (chronic kidney disease) stage 2, GFR 60-89 ml/min 01/04/2013   DM (diabetes mellitus) (HCC) 01/06/2013   Hypertension    hx htn emergency   Obesity    Other secondary pulmonary hypertension  (HCC) 12/19/2021   Stroke [facility])     Past Surgical History: Past Surgical History:  Procedure Laterality Date   BUBBLE STUDY  05/22/2019   Procedure: BUBBLE STUDY;  Surgeon: Chilton Si, MD;  Location: [facility] ENDOSCOPY;  Service: Cardiovascular;;   CARDIOVERSION N/A 11/13/2021   Procedure: CARDIOVERSION;  Surgeon: Chilton Si, MD;  Location: [facility] ENDOSCOPY;  Service: Cardiovascular;  Laterality: N/A;   CARDIOVERSION N/A 11/30/2021   Procedure: CARDIOVERSION;  Surgeon: Wendall Stade, MD;  Location: [facility] ENDOSCOPY;  Service: Cardiovascular;  Laterality: N/A;   LEFT AND RIGHT HEART CATHETERIZATION WITH CORONARY ANGIOGRAM N/A 01/07/2013   Procedure: LEFT AND RIGHT HEART CATHETERIZATION WITH CORONARY ANGIOGRAM;  Surgeon: Ricki Rodriguez, MD;  Location: [facility] CATH LAB;  Service: Cardiovascular;  Laterality: N/A;   TEE WITHOUT CARDIOVERSION N/A 05/22/2019   Procedure: TRANSESOPHAGEAL ECHOCARDIOGRAM (TEE);  Surgeon: Chilton Si, MD;  Location: [facility] ENDOSCOPY;  Service: Cardiovascular;  Laterality: N/A;  TEE WITHOUT CARDIOVERSION N/A 11/13/2021   Procedure: TRANSESOPHAGEAL ECHOCARDIOGRAM (TEE);  Surgeon: Chilton Si, MD;  Location: [facility] ENDOSCOPY;  Service: Cardiovascular;  Laterality: N/A;    Family History: Family History  Problem Relation Age of Onset   Heart disease Father 107       MI   Hypertension Father    Hyperlipidemia Father    Diabetes Mother    Hypertension Mother     Social History: Social History   Socioeconomic History   Marital status: Single    Spouse name: Not on file   Number of children: 0   Years of education: Not on file   Highest education level: Bachelor's degree (e.g., BA, AB, BS)  Occupational History   Occupation: English as a second language teacher  Tobacco Use   Smoking status: Never   Smokeless tobacco: Never  Vaping Use   Vaping status: Never Used  Substance and Sexual Activity   Alcohol use: Not Currently   Drug use: No   Sexual activity: Not on file   Other Topics Concern   Not on file  Social History Narrative   Work or School: Technical sales engineer, Investment banker, corporate      Home Situation: lives alone      Spiritual Beliefs: Christian      Lifestyle: 15 minutes dialy walking - working up; working on diet            Social Determinants of Corporate investment banker Strain: Low Risk  (11/13/2021)   Overall Financial Resource Strain (CARDIA)    Difficulty of Paying Living Expenses: Not very hard  Food Insecurity: No Food Insecurity (03/04/2023)   Hunger Vital Sign    Worried About Running Out of Food in the Last Year: Never true    Ran Out of Food in the Last Year: Never true  Transportation Needs: No Transportation Needs (03/04/2023)   PRAPARE - Administrator, Civil Service (Medical): No    Lack of Transportation (Non-Medical): No  Physical Activity: Sufficiently Active (11/24/2021)   Exercise Vital Sign    Days of Exercise per Week: 7 days    Minutes of Exercise per Session: 30 min  Stress: No Stress Concern Present (11/24/2021)   [employer] of Occupational Health - Occupational Stress Questionnaire    Feeling of Stress : Only a little  Social Connections: Moderately Integrated (11/24/2021)   Social Connection and Isolation Panel [NHANES]    Frequency of Communication with Friends and Family: More than three times a week    Frequency of Social Gatherings with Friends and Family: Three times a week    Attends Religious Services: More than 4 times per year    Active Member of Clubs or Organizations: Yes    Attends Banker Meetings: More than 4 times per year    Marital Status: Never married    Allergies:  No Known Allergies  Objective:    Vital Signs:   Temp:  [97.3 F (36.3 C)-98.9 F (37.2 C)] 97.8 F (36.6 C) (10/01 0825) Pulse Rate:  [38-97] 72 (10/01 1030) Resp:  [17-36] 32 (10/01 1030) BP: (102-179)/(69-138) 108/89 (10/01 1030) SpO2:  [94 %-100 %] 97 % (10/01  1030) Weight:  [147.3 kg] 147.3 kg (09/30 1229)    Weight change: Filed Weights   03/04/23 1229  Weight: (!) 147.3 kg    Intake/Output:   Intake/Output Summary (Last 24 hours) at 03/05/2023 1142 Last data filed at 03/05/2023 1108 Gross per 24 hour  Intake 425 ml  Output 2850 ml  Net -2425 ml      Physical Exam    General:  Obese appearing. Dyspneic at rest HEENT: normal Neck: supple. JVP 10 cm . Carotids 2+ bilat; no bruits. No lymphadenopathy or thyromegaly appreciated. Cor: PMI nondisplaced. Regular rate & rhythm. No rubs, gallops or murmurs. Lungs: diminished in all quadrants Abdomen: firm, nontender, distended. No hepatosplenomegaly. No bruits or masses. Good bowel sounds. Extremities: no cyanosis, clubbing, rash, edema 3+ in thighs, scrotal edema Neuro: alert & orientedx3, cranial nerves grossly intact. moves all 4 extremities w/o difficulty. Affect pleasant   Telemetry   SR 70-80 with PACs  EKG    9/30: SR in the 90s with PVCs (reviewed)  Labs   Basic Metabolic Panel: Recent Labs  Lab 03/04/23 1253 03/04/23 2244 03/05/23 0225  NA 138  --  138  K 3.2*  --  3.7  CL 106  --  103  CO2 20*  --  25  GLUCOSE 186*  --  176*  BUN 25*  --  25*  CREATININE 2.67*  --  2.67*  CALCIUM 8.3*  --  8.2*  MG  --  1.6*  --     Liver Function Tests: Recent Labs  Lab 03/04/23 1253 03/05/23 0225  AST 20 18  ALT 18 20  ALKPHOS 38 34*  BILITOT 0.8 1.2  PROT 6.8 6.7  ALBUMIN 2.9* 2.8*   No results for input(s): "LIPASE", "AMYLASE" in the last 168 hours. No results for input(s): "AMMONIA" in the last 168 hours.  CBC: Recent Labs  Lab 03/04/23 1253 03/05/23 0225  WBC 6.5 5.9  HGB 11.7* 11.6*  HCT 39.0 37.9*  MCV 96.5 97.4  PLT 182 180    Cardiac Enzymes: No results for input(s): "CKTOTAL", "CKMB", "CKMBINDEX", "TROPONINI" in the last 168 hours.  BNP: BNP (last 3 results) Recent Labs    03/04/23 1300  BNP 1,563.8*    ProBNP (last 3 results) No  results for input(s): "PROBNP" in the last 8760 hours.   CBG: No results for input(s): "GLUCAP" in the last 168 hours.  Coagulation Studies: No results for input(s): "LABPROT", "INR" in the last 72 hours.   Imaging   DG Chest 1 View  Result Date: 03/04/2023 CLINICAL DATA:  Patient with shortness of breath and chest pain. EXAM: CHEST  1 VIEW COMPARISON:  Chest radiograph 11/09/2021 FINDINGS: Cardiomegaly. Patchy opacities within the right mid lung in left lung base. No pleural effusion or pneumothorax. Thoracic spine degenerative changes. IMPRESSION: Patchy opacities within the right mid lung and left lung base may represent atelectasis or infection. Electronically Signed   By: Annia Belt M.D.   On: 03/04/2023 15:18     Medications:     Current Medications:  amiodarone  200 mg Oral Daily   apixaban  5 mg Oral BID   atorvastatin  40 mg Oral Daily   carvedilol  3.125 mg Oral BID WC   dapagliflozin propanediol  10 mg Oral Daily   furosemide  80 mg Intravenous BID   isosorbide-hydrALAZINE  1 tablet Oral TID   sodium chloride flush  3 mL Intravenous Q12H    Infusions:     Patient Profile   51/M w severe biventricular systolic heart failure EF less than 20% with severely reduced RV on TEE 6/23, NICM, CKD 3B, poorly controlled hypertension, prediabetes, CVA, atrial fibrillation, OSA, obesity, CHF presented with worsening shortness of breath.   Assessment/Plan   1. Acute on Chronic  Biventricular Systolic Heart Failure - NICM, likely 2/2 poorly controlled HTN + tachymediated from rapid Afib . - Echo (2014): EF 25-30%, LHC no CAD  - Echo (4/17): EF 20-25%, RV mild dilated with moderately reduced systolic function, severe LAE - SPEP negative, no M spike. UPEP negative in 2017  - Echo (10/17): EF normalized, 50-55% - Echo (12/20): EF 50-55% - Echo (6/23): EF 25-30%, global HK, RV normal. In setting of new Afib w/  RVR, uncontrolled hypertension and poor compliance w/ meds  -  Not current candidate for LHC given CKD w/ b/l SCr >2 - NYHA IV, severely hypervolemic on exam. Given 120 mg IV Lasix and started on 80mg  IV Lasix BID in ED, with adequate response >2L UOP/24h. - Hold Carvedilol in setting of decompensated HF - Continue BiDil 20-37.5 mg - Hold Entresto 24-26 mg given renal function - Continue Farxiga 10 mg daily. - Not currently taking spiro given renal function - No digoxin w/ CKD. - Repeat ECHO pending, recommend continuing aggressive diuresis in the setting of volume overload, potential need for RHC once approaching euvolemia   2. H/o PAF  - Diagnosed 6/23 - Sleep study with moderate OSA 8/24 - s/p multiple DCCV to NSR w/ ERAF (last 11/30/21) - On admit SR in 80s on EKG - Continue amiodarone 200 mg daily.  - Continue Eliquis 5 mg BID. Reports noncompliance d/t insurance coverage. - Followed in EP Clinic. Needs to be considered for Afib ablation. LA diameter 4.40 cm   3. Hypertension  - Poorly controlled. Elevated today. - Resume home medications as above   4. CKD Stage IIIb - IV - Suspect hypertensive/ DM nephropathy  - Prev followed by Nephrology but lost to f/u - Baseline SCr >2, unclear baseline - Continue Farxiga 10 mg daily - He has been referred to [facility].  - On admit Scr 2.67 (last 4/24 1.97). Denies taking recent NSAIDs   5. Type 2 DM  - Recent Hgb A1c 6.4.  - Continue SGLT2i.   6. H/o CVA  - Cryptogenetic (2020). Refused loop recorder. - ? If cardioembolic, given recent afib detection. - Now on Eliquis 5 mg BID, although nonadherent as outpatient.    7. OSA - Sleep study + for OSA on 8/24. Pt states he was not aware of results. - CPAP recommended  Discussed with PharmD about getting patient assistance with medications given that patient is currently uninsured.   Length of Stay: 1  [place] Lee, NP  03/05/2023, 11:42 AM  Advanced Heart Failure Team Pager [phone] (M-F; 7a - 5p)  Please contact [facility] for  night-coverage after hours (4p -7a ) and weekends on [email or web address]

## 2023-03-05 NOTE — Progress Notes (Signed)
  Echocardiogram 2D Echocardiogram has been performed.  Leda Roys RDCS 03/05/2023, 2:40 PM

## 2023-03-05 NOTE — ED Notes (Signed)
Bed ordered for patient.

## 2023-03-05 NOTE — Plan of Care (Signed)
Admitted from Emergency Department. Placed on cardiac monitoring. Magnesium replaced per order. Educated on plan of care for diuresing.   Problem: Education: Goal: Ability to demonstrate management of disease process will improve Outcome: Progressing Goal: Ability to verbalize understanding of medication therapies will improve Outcome: Progressing Goal: Individualized Educational Video(s) Outcome: Progressing   Problem: Activity: Goal: Capacity to carry out activities will improve Outcome: Progressing   Problem: Cardiac: Goal: Ability to achieve and maintain adequate cardiopulmonary perfusion will improve Outcome: Progressing   Problem: Education: Goal: Knowledge of General Education information will improve Description: Including pain rating scale, medication(s)/side effects and non-pharmacologic comfort measures Outcome: Progressing   Problem: Health Behavior/Discharge Planning: Goal: Ability to manage health-related needs will improve Outcome: Progressing   Problem: Clinical Measurements: Goal: Ability to maintain clinical measurements within normal limits will improve Outcome: Progressing Goal: Will remain free from infection Outcome: Progressing Goal: Diagnostic test results will improve Outcome: Progressing Goal: Respiratory complications will improve Outcome: Progressing Goal: Cardiovascular complication will be avoided Outcome: Progressing   Problem: Activity: Goal: Risk for activity intolerance will decrease Outcome: Progressing   Problem: Nutrition: Goal: Adequate nutrition will be maintained Outcome: Progressing   Problem: Coping: Goal: Level of anxiety will decrease Outcome: Progressing   Problem: Elimination: Goal: Will not experience complications related to bowel motility Outcome: Progressing Goal: Will not experience complications related to urinary retention Outcome: Progressing   Problem: Pain Managment: Goal: General experience of comfort  will improve Outcome: Progressing   Problem: Safety: Goal: Ability to remain free from injury will improve Outcome: Progressing   Problem: Skin Integrity: Goal: Risk for impaired skin integrity will decrease Outcome: Progressing

## 2023-03-06 ENCOUNTER — Other Ambulatory Visit: Payer: Self-pay

## 2023-03-06 DIAGNOSIS — E785 Hyperlipidemia, unspecified: Secondary | ICD-10-CM

## 2023-03-06 LAB — CBC
HCT: 41.5 % (ref 39.0–52.0)
Hemoglobin: 12.7 g/dL — ABNORMAL LOW (ref 13.0–17.0)
MCH: 28.7 pg (ref 26.0–34.0)
MCHC: 30.6 g/dL (ref 30.0–36.0)
MCV: 93.9 fL (ref 80.0–100.0)
Platelets: 203 10*3/uL (ref 150–400)
RBC: 4.42 MIL/uL (ref 4.22–5.81)
RDW: 15.1 % (ref 11.5–15.5)
WBC: 7.1 10*3/uL (ref 4.0–10.5)
nRBC: 0.3 % — ABNORMAL HIGH (ref 0.0–0.2)

## 2023-03-06 LAB — RENAL FUNCTION PANEL
Albumin: 3.1 g/dL — ABNORMAL LOW (ref 3.5–5.0)
Anion gap: 14 (ref 5–15)
BUN: 28 mg/dL — ABNORMAL HIGH (ref 6–20)
CO2: 25 mmol/L (ref 22–32)
Calcium: 8.6 mg/dL — ABNORMAL LOW (ref 8.9–10.3)
Chloride: 99 mmol/L (ref 98–111)
Creatinine, Ser: 2.76 mg/dL — ABNORMAL HIGH (ref 0.61–1.24)
GFR, Estimated: 27 mL/min — ABNORMAL LOW (ref >60)
Glucose, Bld: 185 mg/dL — ABNORMAL HIGH (ref 70–99)
Phosphorus: 4.4 mg/dL (ref 2.5–4.6)
Potassium: 3.9 mmol/L (ref 3.5–5.1)
Sodium: 138 mmol/L (ref 135–145)

## 2023-03-06 LAB — COOXEMETRY PANEL
Carboxyhemoglobin: 2 % — ABNORMAL HIGH (ref 0.5–1.5)
Carboxyhemoglobin: 2.7 % — ABNORMAL HIGH (ref 0.5–1.5)
Methemoglobin: 0.8 % (ref 0.0–1.5)
Methemoglobin: <0.7 % (ref 0.0–1.5)
O2 Saturation: 43.8 %
O2 Saturation: 59 %
Total hemoglobin: 12.6 g/dL (ref 12.0–16.0)
Total hemoglobin: 11.9 g/dL — ABNORMAL LOW (ref 12.0–16.0)

## 2023-03-06 LAB — BASIC METABOLIC PANEL
Anion gap: 11 (ref 5–15)
BUN: 27 mg/dL — ABNORMAL HIGH (ref 6–20)
CO2: 27 mmol/L (ref 22–32)
Calcium: 8.7 mg/dL — ABNORMAL LOW (ref 8.9–10.3)
Chloride: 101 mmol/L (ref 98–111)
Creatinine, Ser: 2.78 mg/dL — ABNORMAL HIGH (ref 0.61–1.24)
GFR, Estimated: 27 mL/min — ABNORMAL LOW (ref >60)
Glucose, Bld: 162 mg/dL — ABNORMAL HIGH (ref 70–99)
Potassium: 3.7 mmol/L (ref 3.5–5.1)
Sodium: 139 mmol/L (ref 135–145)

## 2023-03-06 LAB — MAGNESIUM: Magnesium: 1.8 mg/dL (ref 1.7–2.4)

## 2023-03-06 MED ORDER — ISOSORB DINITRATE-HYDRALAZINE 20-37.5 MG PO TABS
2.0000 | ORAL_TABLET | Freq: Three times a day (TID) | ORAL | Status: DC
  Administered 2023-03-06 – 2023-03-11 (×17): 2 via ORAL
  Filled 2023-03-06 (×17): qty 2

## 2023-03-06 MED ORDER — MILRINONE LACTATE IN DEXTROSE 20-5 MG/100ML-% IV SOLN
0.2500 ug/kg/min | INTRAVENOUS | Status: DC
  Administered 2023-03-06 – 2023-03-08 (×5): 0.25 ug/kg/min via INTRAVENOUS
  Filled 2023-03-06 (×5): qty 100

## 2023-03-06 MED ORDER — ACETAZOLAMIDE SODIUM 500 MG IJ SOLR
500.0000 mg | Freq: Once | INTRAMUSCULAR | Status: AC
  Administered 2023-03-06: 500 mg via INTRAVENOUS
  Filled 2023-03-06: qty 500

## 2023-03-06 MED ORDER — FUROSEMIDE 10 MG/ML IJ SOLN
15.0000 mg/h | INTRAVENOUS | Status: DC
  Administered 2023-03-06 – 2023-03-08 (×4): 15 mg/h via INTRAVENOUS
  Filled 2023-03-06 (×5): qty 20

## 2023-03-06 MED ORDER — MILRINONE LACTATE IN DEXTROSE 20-5 MG/100ML-% IV SOLN
0.2500 ug/kg/min | INTRAVENOUS | Status: DC
  Administered 2023-03-06: 0.25 ug/kg/min via INTRAVENOUS
  Filled 2023-03-06: qty 100

## 2023-03-06 MED ORDER — SODIUM CHLORIDE 0.9% FLUSH
10.0000 mL | Freq: Two times a day (BID) | INTRAVENOUS | Status: DC
  Administered 2023-03-06: 10 mL

## 2023-03-06 MED ORDER — SODIUM CHLORIDE 0.9% FLUSH: 10.0000 mL | INTRAVENOUS | Status: DC | PRN

## 2023-03-06 MED ORDER — CHLORHEXIDINE GLUCONATE CLOTH 2 % EX PADS
6.0000 | MEDICATED_PAD | Freq: Every day | CUTANEOUS | Status: DC
  Administered 2023-03-06 – 2023-03-12 (×6): 6 via TOPICAL

## 2023-03-06 MED ORDER — POTASSIUM CHLORIDE CRYS ER 20 MEQ PO TBCR
40.0000 meq | EXTENDED_RELEASE_TABLET | Freq: Once | ORAL | Status: AC
  Administered 2023-03-06: 40 meq via ORAL
  Filled 2023-03-06: qty 2

## 2023-03-06 MED ORDER — POTASSIUM CHLORIDE CRYS ER 20 MEQ PO TBCR
40.0000 meq | EXTENDED_RELEASE_TABLET | Freq: Once | ORAL | Status: AC
  Administered 2023-03-06: 40 meq via ORAL
  Filled 2023-03-06: qty 4

## 2023-03-06 NOTE — Progress Notes (Signed)
Mobility Specialist Progress Note:   03/06/23 1400  Mobility  Activity Ambulated with assistance in hallway  Level of Assistance Contact guard assist, steadying assist  Assistive Device Other (Comment) (IV Pole)  Distance Ambulated (ft) 100 ft  Activity Response Tolerated well  Mobility Referral Yes  $Mobility charge 1 Mobility  Mobility Specialist Start Time (ACUTE ONLY) 1430  Mobility Specialist Stop Time (ACUTE ONLY) 1437  Mobility Specialist Time Calculation (min) (ACUTE ONLY) 7 min    Pre Mobility: 86 HR During Mobility: 98 HR Post Mobility:  89 HR  Pt received in bed, agreeable to mobility. C/o some fatigue during session, taking x1 standing rest break. Pt left on EOB with call bell and all needs met.  D'Vante Earlene Plater Mobility Specialist Please contact via Special educational needs teacher or Rehab office at [phone]

## 2023-03-06 NOTE — TOC Initial Note (Signed)
Transition of Care [facility]) - Initial/Assessment Note    Patient Details  Name: Richard [name] MRN: [id number] Date of Birth: 1970-10-26  Transition of Care [facility]) CM/SW Contact:    Nicanor Bake Phone Number: [phone] 03/06/2023, 3:08 PM  Clinical Narrative:  HF CSW met with pt at bedside. Pt lives at home alone.Pt stated that he has no children. Pt works full time as a Designer, industrial/product. Pt stated that he will need a work note closer to dc.Pt stated that he has no history of HH services. Pt has a scale at home. Pt stated that he does not use any equipment. Pt stated that he does not have a PCP. CSW stated that a follow up hospital appointment will be scheduled closer to dc.   TOC will continue following.                  Expected Discharge Plan: Home/Self Care Barriers to Discharge: Continued Medical Work up   Patient Goals and CMS Choice Patient states their goals for this hospitalization and ongoing recovery are:: return home          Expected Discharge Plan and Services       Living arrangements for the past 2 months: Single Family Home                                      Prior Living Arrangements/Services Living arrangements for the past 2 months: Single Family Home Lives with:: Self Patient language and need for interpreter reviewed:: Yes Do you feel safe going back to the place where you live?: Yes      Need for Family Participation in Patient Care: No (Comment) Care giver support system in place?: No (comment)   Criminal Activity/Legal Involvement Pertinent to Current Situation/Hospitalization: No - Comment as needed  Activities of Daily Living   ADL Screening (condition at time of admission) Independently performs ADLs?: Yes (appropriate for developmental age) Does the patient have a NEW difficulty with bathing/dressing/toileting/self-feeding that is expected to last >3 days?: No Does the patient have a NEW difficulty with getting in/out of  bed, walking, or climbing stairs that is expected to last >3 days?: No Does the patient have a NEW difficulty with communication that is expected to last >3 days?: No Is the patient deaf or have difficulty hearing?: No Does the patient have difficulty seeing, even when wearing glasses/contacts?: No Does the patient have difficulty concentrating, remembering, or making decisions?: No  Permission Sought/Granted                  Emotional Assessment Appearance:: Appears older than stated age Attitude/Demeanor/Rapport: Engaged   Orientation: : Oriented to Self, Oriented to Place, Oriented to  Time, Oriented to Situation Alcohol / Substance Use: Not Applicable Psych Involvement: No (comment)  Admission diagnosis:  Shortness of breath [R06.02] Acute on chronic systolic (congestive) heart failure (HCC) [I50.23] Patient Active Problem List   Diagnosis Date Noted   Acute on chronic systolic (congestive) heart failure (HCC) 03/04/2023   Other secondary pulmonary hypertension (HCC) 12/19/2021   Prediabetes 11/15/2021   Persistent atrial fibrillation (HCC) 11/09/2021   AKI (acute kidney injury) (HCC) 11/09/2021   HFrEF (heart failure with reduced ejection fraction) (HCC) 11/08/2020   History of CVA (cerebrovascular accident) 11/08/2020   Obesity, diabetes, and hypertension syndrome (HCC) 11/08/2020   Stage 3b chronic kidney disease (HCC) 10/06/2015   Disorder associated  with well-controlled type 2 diabetes mellitus (HCC) 02/17/2013   Essential hypertension, benign 01/15/2013   PCP:  Pcp, No Pharmacy:   [facility] - [place], [place] - [address] [place] [address] Phone: [phone] Fax: [phone]  [id number] - [place], [place] - [address] [place] [address] Phone: [phone] Fax: [phone]     Social Determinants of Health (SDOH) Social History: SDOH Screenings   Food  Insecurity: No Food Insecurity (03/04/2023)  Housing: Medium Risk (03/04/2023)  Transportation Needs: No Transportation Needs (03/04/2023)  Utilities: Not At Risk (03/04/2023)  Alcohol Screen: Low Risk  (11/13/2021)  Depression (PHQ2-9): Low Risk  (11/24/2021)  Financial Resource Strain: Low Risk  (11/13/2021)  Physical Activity: Sufficiently Active (11/24/2021)  Social Connections: Moderately Integrated (11/24/2021)  Stress: No Stress Concern Present (11/24/2021)  Tobacco Use: Low Risk  (03/04/2023)   SDOH Interventions:     Readmission Risk Interventions     No data to display

## 2023-03-06 NOTE — Assessment & Plan Note (Addendum)
Continue blood pressure control with bidil.

## 2023-03-06 NOTE — Progress Notes (Addendum)
Advanced Heart Failure Rounding Note  PCP-Cardiologist: Christell Constant, MD   Subjective:   Admitted with volume overload. Started on IV lasix. Negative 1.9 liters.   Creatinine 2.67>2.78.   Remains SOB with exertion.   Objective:   Weight Range: (!) 158.1 kg Body mass index is 48.61 kg/m.   Vital Signs:   Temp:  [97.4 F (36.3 C)-98.1 F (36.7 C)] 98.1 F (36.7 C) (10/02 0442) Pulse Rate:  [72-96] 89 (10/02 0442) Resp:  [17-32] 18 (10/02 0442) BP: (108-147)/(77-134) 147/134 (10/02 0442) SpO2:  [94 %-100 %] 100 % (10/02 0442) Weight:  [158.1 kg-159.1 kg] 158.1 kg (10/02 0500) Last BM Date : 03/05/23  Weight change: Filed Weights   03/04/23 1229 03/05/23 1329 03/06/23 0500  Weight: (!) 147.3 kg (!) 159.1 kg (!) 158.1 kg    Intake/Output:   Intake/Output Summary (Last 24 hours) at 03/06/2023 0831 Last data filed at 03/06/2023 0729 Gross per 24 hour  Intake 416.52 ml  Output 3200 ml  Net -2783.48 ml      Physical Exam    General:  No resp difficulty. Sitting on the side of the bed.  HEENT: Normal Neck: Supple. JVP to jaw  . Carotids 2+ bilat; no bruits. No lymphadenopathy or thyromegaly appreciated. Cor: PMI nondisplaced. Regular rate & rhythm. No rubs, gallops or murmurs. Lungs: Clear Abdomen:obese, soft, nontender, nondistended. No hepatosplenomegaly. No bruits or masses. Good bowel sounds. Extremities: No cyanosis, clubbing, rash, R and LLE 2+ edema extending to thighs.  Neuro: Alert & orientedx3, cranial nerves grossly intact. moves all 4 extremities w/o difficulty. Affect pleasant   Telemetry  SR 80s   EKG   N/A   Labs    CBC Recent Labs    03/05/23 0225 03/06/23 0303  WBC 5.9 7.1  HGB 11.6* 12.7*  HCT 37.9* 41.5  MCV 97.4 93.9  PLT 180 203   Basic Metabolic Panel Recent Labs    91/47/82 2244 03/05/23 0225 03/06/23 0303  NA  --  138 139  K  --  3.7 3.7  CL  --  103 101  CO2  --  25 27  GLUCOSE  --  176* 162*  BUN  --   25* 27*  CREATININE  --  2.67* 2.78*  CALCIUM  --  8.2* 8.7*  MG 1.6*  --  1.8   Liver Function Tests Recent Labs    03/04/23 1253 03/05/23 0225  AST 20 18  ALT 18 20  ALKPHOS 38 34*  BILITOT 0.8 1.2  PROT 6.8 6.7  ALBUMIN 2.9* 2.8*   No results for input(s): "LIPASE", "AMYLASE" in the last 72 hours. Cardiac Enzymes No results for input(s): "CKTOTAL", "CKMB", "CKMBINDEX", "TROPONINI" in the last 72 hours.  BNP: BNP (last 3 results) Recent Labs    03/04/23 1300  BNP 1,563.8*    ProBNP (last 3 results) No results for input(s): "PROBNP" in the last 8760 hours.   D-Dimer No results for input(s): "DDIMER" in the last 72 hours. Hemoglobin A1C Recent Labs    03/05/23 0222  HGBA1C 8.4*   Fasting Lipid Panel No results for input(s): "CHOL", "HDL", "LDLCALC", "TRIG", "CHOLHDL", "LDLDIRECT" in the last 72 hours. Thyroid Function Tests No results for input(s): "TSH", "T4TOTAL", "T3FREE", "THYROIDAB" in the last 72 hours.  Invalid input(s): "FREET3"  Other results:   Imaging    ECHOCARDIOGRAM COMPLETE  Result Date: 03/05/2023    ECHOCARDIOGRAM REPORT   Patient Name:   Richard [name] Date of Exam: 03/05/2023  Medical Rec #:  [id number]     Height:       71.0 in Accession #:    [id number]    Weight:       324.7 lb Date of Birth:  [birth year]     BSA:          2.591 m Patient Age:    51 years      BP:           119/81 mmHg Patient Gender: M             HR:           79 bpm. Exam Location:  Inpatient Procedure: 2D Echo, Cardiac Doppler, Color Doppler and Intracardiac            Opacification Agent Indications:    CHF-Acute Systolic I50.21  History:        Patient has prior history of Echocardiogram examinations, most                 recent 11/09/2021. CHF; Risk Factors:Diabetes and Hypertension.  Sonographer:    Harriette Bouillon RDCS Referring Phys: [id number] Cecille Po MELVIN IMPRESSIONS  1. Left ventricular ejection fraction, by estimation, is 25%. The left ventricle has severely  decreased function. The left ventricle demonstrates global hypokinesis. The left ventricular internal cavity size was moderately dilated. Left ventricular diastolic parameters are consistent with Grade II diastolic dysfunction (pseudonormalization).  2. Right ventricular systolic function is mildly reduced. The right ventricular size is normal. There is moderately elevated pulmonary artery systolic pressure. The estimated right ventricular systolic pressure is 45.9 mmHg.  3. Left atrial size was mildly dilated.  4. Right atrial size was mildly dilated.  5. The mitral valve is normal in structure. Trivial mitral valve regurgitation. No evidence of mitral stenosis.  6. The aortic valve is tricuspid. There is mild calcification of the aortic valve. Aortic valve regurgitation is not visualized. No aortic stenosis is present.  7. The inferior vena cava is dilated in size with <50% respiratory variability, suggesting right atrial pressure of 15 mmHg.  8. Technically difficult study with very poor images. FINDINGS  Left Ventricle: Left ventricular ejection fraction, by estimation, is 25%. The left ventricle has severely decreased function. The left ventricle demonstrates global hypokinesis. Definity contrast agent was given IV to delineate the left ventricular endocardial borders. The left ventricular internal cavity size was moderately dilated. There is no left ventricular hypertrophy. Left ventricular diastolic parameters are consistent with Grade II diastolic dysfunction (pseudonormalization). Right Ventricle: The right ventricular size is normal. Right vetricular wall thickness was not well visualized. Right ventricular systolic function is mildly reduced. There is moderately elevated pulmonary artery systolic pressure. The tricuspid regurgitant velocity is 2.78 m/s, and with an assumed right atrial pressure of 15 mmHg, the estimated right ventricular systolic pressure is 45.9 mmHg. Left Atrium: Left atrial size was  mildly dilated. Right Atrium: Right atrial size was mildly dilated. Pericardium: There is no evidence of pericardial effusion. Mitral Valve: The mitral valve is normal in structure. There is mild calcification of the mitral valve leaflet(s). Mild mitral annular calcification. Trivial mitral valve regurgitation. No evidence of mitral valve stenosis. Tricuspid Valve: The tricuspid valve is normal in structure. Tricuspid valve regurgitation is trivial. Aortic Valve: The aortic valve is tricuspid. There is mild calcification of the aortic valve. Aortic valve regurgitation is not visualized. No aortic stenosis is present. Pulmonic Valve: The pulmonic valve was normal in structure. Pulmonic valve regurgitation is trivial. Aorta: The  aortic root is normal in size and structure. Venous: The inferior vena cava is dilated in size with less than 50% respiratory variability, suggesting right atrial pressure of 15 mmHg. IAS/Shunts: No atrial level shunt detected by color flow Doppler.  LEFT VENTRICLE PLAX 2D LVIDd:         6.50 cm   Diastology LVIDs:         5.80 cm   LV e' medial:    3.59 cm/s LV PW:         1.10 cm   LV E/e' medial:  19.6 LV IVS:        1.00 cm   LV e' lateral:   8.27 cm/s LVOT diam:     2.10 cm   LV E/e' lateral: 8.5 LV SV:         36 LV SV Index:   14 LVOT Area:     3.46 cm  RIGHT VENTRICLE            IVC RV S prime:     5.11 cm/s  IVC diam: 3.10 cm TAPSE (M-mode): 1.0 cm LEFT ATRIUM           Index        RIGHT ATRIUM           Index LA diam:      4.00 cm 1.54 cm/m   RA Area:     20.00 cm LA Vol (A4C): 53.5 ml 20.65 ml/m  RA Volume:   59.10 ml  22.81 ml/m  AORTIC VALVE LVOT Vmax:   59.50 cm/s LVOT Vmean:  40.300 cm/s LVOT VTI:    0.105 m  AORTA Ao Root diam: 3.10 cm Ao Asc diam:  3.10 cm MITRAL VALVE               TRICUSPID VALVE MV Area (PHT): 4.49 cm    TR Peak grad:   30.9 mmHg MV Decel Time: 169 msec    TR Vmax:        278.00 cm/s MV E velocity: 70.30 cm/s MV A velocity: 26.40 cm/s  SHUNTS MV E/A  ratio:  2.66        Systemic VTI:  0.10 m                            Systemic Diam: 2.10 cm Dalton McleanMD Electronically signed by Wilfred Lacy Signature Date/Time: 03/05/2023/2:48:55 PM    Final      Medications:     Scheduled Medications:  amiodarone  200 mg Oral Daily   apixaban  5 mg Oral BID   atorvastatin  40 mg Oral Daily   carvedilol  3.125 mg Oral BID WC   dapagliflozin propanediol  10 mg Oral Daily   furosemide  80 mg Intravenous BID   isosorbide-hydrALAZINE  1 tablet Oral TID   sodium chloride flush  3 mL Intravenous Q12H    Infusions:  sodium chloride 10 mL/hr at 03/05/23 1836    PRN Medications: sodium chloride, acetaminophen **OR** acetaminophen, polyethylene glycol    Patient Profile   51/M w severe biventricular systolic heart failure EF less than 20% with severely reduced RV on TEE 6/23, NICM, CKD 3B, poorly controlled hypertension, prediabetes, CVA, atrial fibrillation, OSA, obesity, CHF presented with worsening shortness of breath.   Admitted with A/C HFrEF.   Assessment/Plan   1. Acute on Chronic Biventricular Systolic Heart Failure - NICM, likely 2/2 poorly controlled HTN + tachymediated from  rapid Afib . - Echo (2014): EF 25-30%, LHC no CAD  - Echo (4/17): EF 20-25%, RV mild dilated with moderately reduced systolic function, severe LAE - SPEP negative, no M spike. UPEP negative in 2017  - Echo (10/17): EF normalized, 50-55% - Echo (12/20): EF 50-55% - Echo (6/23): EF 25-30%, global HK, RV normal. In setting of new Afib w/  RVR, uncontrolled hypertension and poor compliance w/ meds  - Not current candidate for LHC given CKD w/ b/l SCr >2 - NYHA IV, marked volume overload. Given 120 mg IV Lasix and started on 80mg  IV Lasix BID in ED.  - Echo EF 25% RV mildly reduced - Will need to place PICC. Give dose of IV lasix 80 mg now then start lasix drip at 15 mg per hour. Given diamox 500 mg twice a day   - Hold Carvedilol in setting of decompensated  HF - Increase Bidil 2 tab three times day.  -  Continue Farxiga 10 mg daily. - No dig, arni, or mra with worsening renal function.  - Place PICC for co-ox and CVP. May need to add milrinone.  - Place unna boots.   2. H/o PAF  - Diagnosed 6/23 - Sleep study with moderate OSA 8/24 - s/p multiple DCCV to NSR w/ ERAF (last 11/30/21) - Maintaining SR.  - Continue amiodarone 200 mg daily.  - Continue Eliquis 5 mg BID.  - Followed in EP Clinic. Needs to be considered for Afib ablation. LA diameter 4.40 cm   3. Hypertension  - Poorly controlled. Elevated today. - Increase Bidil 2 tab three times a day.    4. CKD Stage IV - Suspect hypertensive/ DM nephropathy  - Prev followed by Nephrology but lost to f/u will need to re-establish.  - Baseline SCr >2, unclear baseline - Continue Farxiga 10 mg daily - He has been referred to [facility].  - On admit Scr 2.67 (last 4/24 1.97)-->2.78 today.  - Denies taking recent NSAIDs   5. Type 2 DM  - Recent Hgb A1c 6.4.  - Continue SGLT2i.   6. H/o CVA  - Cryptogenetic (2020). Refused loop recorder. - PTA he was out of eliquis. Restarted eliquis on admit.      7. OSA - Sleep study + for OSA on 8/24. Pt states he was not aware of results. - CPAP recommended   Place unna boots.  Will ask SW to see today.   Length of Stay: 2  Tonye Becket, NP  03/06/2023, 8:31 AM  Advanced Heart Failure Team Pager [phone] (M-F; 7a - 5p)  Please contact [facility] for night-coverage after hours (5p -7a ) and weekends on [email or web address]

## 2023-03-06 NOTE — Progress Notes (Signed)
Orthopedic Tech Progress Note Patient Details:  Richard [name] [birth year] [id number]  Ortho Devices Type of Ortho Device: Radio broadcast assistant Ortho Device/Splint Location: Bilat LE Ortho Device/Splint Interventions: Ordered, Application   Post Interventions Patient Tolerated: Well  [name] 03/06/2023, 9:39 AM

## 2023-03-06 NOTE — TOC Progression Note (Signed)
Transition of Care [facility]) - Progression Note    Patient Details  Name: Richard [name] MRN: [id number] Date of Birth: 05/26/1971  [facility] CM/SW Contact  Nicanor Bake Phone Number: [phone] 03/06/2023, 12:29 PM  Clinical Narrative:   HF CSW attempted to meet with pt at bedside. However, a sterile procedure was in progress. CSW will follow up at a later time.   TOC will continue following.           Expected Discharge Plan and Services                                               Social Determinants of Health (SDOH) Interventions SDOH Screenings   Food Insecurity: No Food Insecurity (03/04/2023)  Housing: Medium Risk (03/04/2023)  Transportation Needs: No Transportation Needs (03/04/2023)  Utilities: Not At Risk (03/04/2023)  Alcohol Screen: Low Risk  (11/13/2021)  Depression (PHQ2-9): Low Risk  (11/24/2021)  Financial Resource Strain: Low Risk  (11/13/2021)  Physical Activity: Sufficiently Active (11/24/2021)  Social Connections: Moderately Integrated (11/24/2021)  Stress: No Stress Concern Present (11/24/2021)  Tobacco Use: Low Risk  (03/04/2023)    Readmission Risk Interventions     No data to display

## 2023-03-06 NOTE — Assessment & Plan Note (Addendum)
Echocardiogram with reduced LV systolic function 25%, global hypokinesis, LV cavity with moderate dilatation, RV systolic function with mild reduction, RVSP 45.9 mmHg. LA and RA with mild dilatation.   Resolved low output heart failure.   10/07 right heart catheterization. RV 65/10 PA 64/31 mean 43 PCWP mean 16  Cardiac output 7.2 and index 2,8  PVR 3,7 [place].   Urine output 3,600 ml   Systolic blood pressure 140 to 150  SV02 64  10/05 Off milrinone Discontinued acetazolamide and metolazone.  Continue with dapagliflozin, and oral torsemide.  After load reduction with Bidil and  Entresto.   Possible discharge tomorrow.

## 2023-03-06 NOTE — Plan of Care (Signed)

## 2023-03-06 NOTE — TOC Initial Note (Signed)
Transition of Care [facility]) - Initial/Assessment Note    Patient Details  Name: Richard [name] MRN: [id number] Date of Birth: [birth year]  [facility]) CM/SW Contact:    Romond Cousin, RN Phone Number: [phone] 03/06/2023, 5:40 PM  Clinical Narrative:    CM spoke to pt and states he does not have insurance. Financial Counselor has screened for Medicaid. Pt has scale at home for daily weight. Meds to be covered by HF fund.        Will need PCP appt at dc.             Expected Discharge Plan: Home/Self Care Barriers to Discharge: Continued Medical Work up   Patient Goals and CMS Choice Patient states their goals for this hospitalization and ongoing recovery are:: return home          Expected Discharge Plan and Services   Discharge Planning Services: CM Consult   Living arrangements for the past 2 months: Single Family Home                   Prior Living Arrangements/Services Living arrangements for the past 2 months: Single Family Home Lives with:: Self Patient language and need for interpreter reviewed:: Yes Do you feel safe going back to the place where you live?: Yes      Need for Family Participation in Patient Care: No (Comment) Care giver support system in place?: No (comment)   Criminal Activity/Legal Involvement Pertinent to Current Situation/Hospitalization: No - Comment as needed  Activities of Daily Living   ADL Screening (condition at time of admission) Independently performs ADLs?: Yes (appropriate for developmental age) Does the patient have a NEW difficulty with bathing/dressing/toileting/self-feeding that is expected to last >3 days?: No Does the patient have a NEW difficulty with getting in/out of bed, walking, or climbing stairs that is expected to last >3 days?: No Does the patient have a NEW difficulty with communication that is expected to last >3 days?: No Is the patient deaf or have difficulty hearing?: No Does the patient  have difficulty seeing, even when wearing glasses/contacts?: No Does the patient have difficulty concentrating, remembering, or making decisions?: No  Permission Sought/Granted Permission sought to share information with : Case Manager, Family Supports, PCP Permission granted to share information with : Yes, Verbal Permission Granted  Share Information with NAME: Clester Nole     Permission granted to share info w Relationship: brother  Permission granted to share info w Contact Information: [phone]  Emotional Assessment Appearance:: Appears stated age Attitude/Demeanor/Rapport: Engaged Affect (typically observed): Accepting Orientation: : Oriented to Self, Oriented to Place, Oriented to  Time, Oriented to Situation Alcohol / Substance Use: Not Applicable Psych Involvement: No (comment)  Admission diagnosis:  Shortness of breath [R06.02] Acute on chronic systolic (congestive) heart failure (HCC) [I50.23] Patient Active Problem List   Diagnosis Date Noted   Acute on chronic systolic (congestive) heart failure (HCC) 03/04/2023   Other secondary pulmonary hypertension (HCC) 12/19/2021   Prediabetes 11/15/2021   Persistent atrial fibrillation (HCC) 11/09/2021   AKI (acute kidney injury) (HCC) 11/09/2021   HFrEF (heart failure with reduced ejection fraction) (HCC) 11/08/2020   History of CVA (cerebrovascular accident) 11/08/2020   Obesity, diabetes, and hypertension syndrome (HCC) 11/08/2020   Stage 3b chronic kidney disease (HCC) 10/06/2015   Type 2 diabetes mellitus with hyperlipidemia (HCC) 02/17/2013   Essential hypertension, benign 01/15/2013   PCP:  Pcp, No Pharmacy:   [facility] - [place], [place] -  [address] [place] [address] Phone: [phone] Fax: [phone]  [id number] - [place], [place] - [address] [place] [address] Phone: [phone] Fax:  [phone]     Social Determinants of Health (SDOH) Social History: SDOH Screenings   Food Insecurity: No Food Insecurity (03/04/2023)  Housing: Medium Risk (03/04/2023)  Transportation Needs: No Transportation Needs (03/04/2023)  Utilities: Not At Risk (03/04/2023)  Alcohol Screen: Low Risk  (11/13/2021)  Depression (PHQ2-9): Low Risk  (11/24/2021)  Financial Resource Strain: Low Risk  (11/13/2021)  Physical Activity: Sufficiently Active (11/24/2021)  Social Connections: Moderately Integrated (11/24/2021)  Stress: No Stress Concern Present (11/24/2021)  Tobacco Use: Low Risk  (03/04/2023)   SDOH Interventions:     Readmission Risk Interventions     No data to display

## 2023-03-06 NOTE — Assessment & Plan Note (Signed)
Calculated BMI 48,6

## 2023-03-06 NOTE — Progress Notes (Signed)
Peripherally Inserted Central Catheter Placement  The IV Nurse has discussed with the patient and/or persons authorized to consent for the patient, the purpose of this procedure and the potential benefits and risks involved with this procedure.  The benefits include less needle sticks, lab draws from the catheter, and the patient may be discharged home with the catheter. Risks include, but not limited to, infection, bleeding, blood clot (thrombus formation), and puncture of an artery; nerve damage and irregular heartbeat and possibility to perform a PICC exchange if needed/ordered by physician.  Alternatives to this procedure were also discussed.  Bard Power PICC patient education guide, fact sheet on infection prevention and patient information card has been provided to patient /or left at bedside.    PICC Placement Documentation  PICC Double Lumen 03/06/23 Right Basilic 47 cm 0 cm (Active)  Site Assessment Clean, Dry, Intact 03/06/23 1246  Lumen #1 Status Flushed;Saline locked;Blood return noted 03/06/23 1246  Lumen #2 Status Flushed;Saline locked;Blood return noted 03/06/23 1246  Dressing Type Transparent;Securing device 03/06/23 1246  Dressing Status Antimicrobial disc in place 03/06/23 1246  Line Care Connections checked and tightened 03/06/23 1246  Dressing Change Due 03/13/23 03/06/23 1246       Richard [name] 03/06/2023, 12:47 PM

## 2023-03-06 NOTE — Assessment & Plan Note (Addendum)
Continue rate control with amiodarone, anticoagulation with apixaban.  Carvedilol.

## 2023-03-06 NOTE — Progress Notes (Signed)
Progress Note   Patient: Richard [name] QVZ:[id number] DOB: July 03, 1970 DOA: 03/04/2023     2 DOS: the patient was seen and examined on 03/06/2023   Brief hospital course: 51/M w severe biventricular failure EF less than 20% with severely reduced RV on TEE 6/23, CKD 3B hypertension, prediabetes, CVA, atrial fibrillation, OSA, obesity, presented with worsening shortness of breath. - reported 3 days of progressive shortness of breath, lower extremity edema, abdominal distention, reports compliance with meds -In the ED, tachypneic, vital signs stable -Labs noted creatinine of 2.6, BNP 1563, troponin 48, hemoglobin 11, EKG with sinus arrhythmia, chest x-ray with patchy opacities at right and left lung base  Assessment and Plan: Acute on chronic systolic (congestive) heart failure (HCC) Echocardiogram with reduced LV systolic function 25%, global hypokinesis, LV cavity with moderate dilatation, RV systolic function with mild reduction, RVSP 45.9 mmHg. LA and RA with mild dilatation.   Low output heart failure.   Urine output 2,400  Systolic blood pressure 139 to 140  SV02 43.8   Started today on milrinone 0,25 mcg/kg/hr Furosemide drip 15 mg/hr SGLT 2 inh. One dose of IV acetazolamide.  After load reduction with Bidil.   Essential hypertension, benign Continue blood pressure control with bidil.  Aggressive diuresis.    Persistent atrial fibrillation (HCC) Continue rate control with amiodarone, anticoagulation with apixaban.   Stage 3b chronic kidney disease (HCC) Renal function with serum cr at 2,78 with K at 3,9 and serum bicarbonate at 25.  Na 138  Plan to continue diuresis with furosemide and one dose of metolazone.  Inotropic support with milrinone.   Type 2 diabetes mellitus with hyperlipidemia (HCC) Continue glucose cover and monitoring with insulin sliding scale. Continue statin therapy.   History of CVA (cerebrovascular accident) Continue blood pressure control and  anticoagulation.   Obesity, diabetes, and hypertension syndrome (HCC) Calculated BMI 48,6         Subjective: Patient with improvement of dyspnea and edema but not back to baseline   Physical Exam: Vitals:   03/06/23 0003 03/06/23 0442 03/06/23 0500 03/06/23 0810  BP: (!) 139/110 (!) 147/134  (!) 140/105  Pulse: 90 89  92  Resp: 20 18  19   Temp: (!) 97.4 F (36.3 C) 98.1 F (36.7 C)  97.7 F (36.5 C)  TempSrc: Oral Oral  Oral  SpO2: 98% 100%  96%  Weight:   (!) 158.1 kg   Height:       Neurology awake and alert ENT with mild pallor Cardiovascular with S1 and S2 present and regular, no rubs or murmurs. Positive S3 gallop. Respiratory with rales at bases, with no wheezing Abdomen with mild distention Positive lower extremity edema +++ unna boots in place, cold lower extremity edema  Data Reviewed:    Family Communication: no family at the bedside   Disposition: Status is: Inpatient Remains inpatient appropriate because: heart failure on inotropic support   Planned Discharge Destination: Home      Author: Coralie Keens, MD 03/06/2023 3:40 PM  For on call review [email or web address].

## 2023-03-06 NOTE — Hospital Course (Addendum)
Mr. Gloss was admitted to the hospital with the working diagnosis of heart failure decompensation.   51/M with past medical history of heart failure, CKD 3B hypertension,T2DM, CVA, atrial fibrillation, OSA, and obesity class 3 who presented with worsening shortness of breath. He reported 3 days of progressive shortness of breath, lower extremity edema, and abdominal distention. Positive concerns about non compliance with medications at home. On his initial physical examination his blood pressure was 161/120, 179/130, HR 86, RR 36 and 02 saturation 97% on supplemental 02 per [place], lungs with decreased breath sounds, positive rales bilaterally, heart with S1 and S2 present and regular with no gallops or rubs, no murmurs, abdomen with no distention, positive lower extremity edema +++.   Na 138, K 3,2 Cl 106 bicarbonate 16 glucose 186, bun 25 cr 2,67 BNP 1,563  High sensitive troponin 48 and 69  Wbc 6.5 hgb 11,7 plt 182  Sars covid 19 negative.   Chest radiograph with cardiomegaly, with bilateral hilar vascular congestion and cephalization of the vasculature.   EKG 93 bpm, normal axis, qtc 432, normal intervals, sinus rhythm with sinus arrhythmia, and PVC, poor R R wave progression, with no significant ST segment or T wave changes.   Placed on furosemide for diuresis. Noted in low output heart failure.   10/02 picc line placed and patient started on Milrinone for low output heart failure and furosemide drip. 10/05 off milrinone and transitioned to oral loop diuretic therapy.  10/06 plan for right heart cath tomorrow. Patient requested Unna boots to be removed.  10/07 right heart catheterization mild elevation in filling pressures. Positive pulmonary hypertension.  10/08 patient continue to improve hemodynamics. Plan to continue medical therapy as outpatient and follow up with heart failure clinic.

## 2023-03-06 NOTE — Assessment & Plan Note (Addendum)
Hypokalemia. Hyponatremia.   Volume status is improving, renal function today with serum cr at 2,3, K at 3,7 and serum bicarbonate at 27,  Na 135 Mg 1.9   Patient had metolazone and acetazolamide for sequential nephron blockade.  Continue with SGLT 2 inh and torsemide,   Continue K correction with Kcl 2 doses of 40 meq and one dose of 2 g Mag sulfate. Follow up renal function and electrolytes in am.

## 2023-03-06 NOTE — Assessment & Plan Note (Addendum)
Uncontrolled T2DM with hyperglycemia with HgbA1c at 8,4   Patient was placed on insulin sliding scale for glucose cover and monitoring during his hospitalization.  Glucose remained stable.  Continue statin therapy.

## 2023-03-06 NOTE — Assessment & Plan Note (Signed)
Continue blood pressure control and anticoagulation.

## 2023-03-07 LAB — BASIC METABOLIC PANEL
Anion gap: 10 (ref 5–15)
Anion gap: 11 (ref 5–15)
BUN: 29 mg/dL — ABNORMAL HIGH (ref 6–20)
BUN: 26 mg/dL — ABNORMAL HIGH (ref 6–20)
CO2: 29 mmol/L (ref 22–32)
CO2: 31 mmol/L (ref 22–32)
Calcium: 8.1 mg/dL — ABNORMAL LOW (ref 8.9–10.3)
Calcium: 8.4 mg/dL — ABNORMAL LOW (ref 8.9–10.3)
Chloride: 95 mmol/L — ABNORMAL LOW (ref 98–111)
Chloride: 94 mmol/L — ABNORMAL LOW (ref 98–111)
Creatinine, Ser: 2.87 mg/dL — ABNORMAL HIGH (ref 0.61–1.24)
Creatinine, Ser: 2.68 mg/dL — ABNORMAL HIGH (ref 0.61–1.24)
GFR, Estimated: 26 mL/min — ABNORMAL LOW (ref >60)
GFR, Estimated: 28 mL/min — ABNORMAL LOW (ref >60)
Glucose, Bld: 286 mg/dL — ABNORMAL HIGH (ref 70–99)
Glucose, Bld: 290 mg/dL — ABNORMAL HIGH (ref 70–99)
Potassium: 3 mmol/L — ABNORMAL LOW (ref 3.5–5.1)
Potassium: 3.3 mmol/L — ABNORMAL LOW (ref 3.5–5.1)
Sodium: 134 mmol/L — ABNORMAL LOW (ref 135–145)
Sodium: 136 mmol/L (ref 135–145)

## 2023-03-07 LAB — MAGNESIUM
Magnesium: 1.6 mg/dL — ABNORMAL LOW (ref 1.7–2.4)
Magnesium: 1.9 mg/dL (ref 1.7–2.4)

## 2023-03-07 LAB — GLUCOSE, CAPILLARY
Glucose-Capillary: 157 mg/dL — ABNORMAL HIGH (ref 70–99)
Glucose-Capillary: 175 mg/dL — ABNORMAL HIGH (ref 70–99)
Glucose-Capillary: 174 mg/dL — ABNORMAL HIGH (ref 70–99)
Glucose-Capillary: 211 mg/dL — ABNORMAL HIGH (ref 70–99)

## 2023-03-07 LAB — COOXEMETRY PANEL
Carboxyhemoglobin: 2.8 % — ABNORMAL HIGH (ref 0.5–1.5)
Methemoglobin: <0.7 % (ref 0.0–1.5)
O2 Saturation: 62.4 %
Total hemoglobin: 12.4 g/dL (ref 12.0–16.0)

## 2023-03-07 MED ORDER — POTASSIUM CHLORIDE CRYS ER 20 MEQ PO TBCR
40.0000 meq | EXTENDED_RELEASE_TABLET | Freq: Once | ORAL | Status: AC
  Administered 2023-03-07: 40 meq via ORAL
  Filled 2023-03-07: qty 2

## 2023-03-07 MED ORDER — INSULIN ASPART 100 UNIT/ML IJ SOLN
0.0000 [IU] | Freq: Three times a day (TID) | INTRAMUSCULAR | Status: DC
  Administered 2023-03-07 – 2023-03-08 (×3): 2 [IU] via SUBCUTANEOUS
  Administered 2023-03-08: 3 [IU] via SUBCUTANEOUS
  Administered 2023-03-08 – 2023-03-09 (×3): 2 [IU] via SUBCUTANEOUS
  Administered 2023-03-09: 7 [IU] via SUBCUTANEOUS
  Administered 2023-03-10: 2 [IU] via SUBCUTANEOUS
  Administered 2023-03-10 (×2): 3 [IU] via SUBCUTANEOUS
  Administered 2023-03-11 – 2023-03-12 (×4): 2 [IU] via SUBCUTANEOUS
  Administered 2023-03-12: 3 [IU] via SUBCUTANEOUS
  Administered 2023-03-12: 1 [IU] via SUBCUTANEOUS

## 2023-03-07 MED ORDER — MAGNESIUM SULFATE 2 GM/50ML IV SOLN
2.0000 g | Freq: Once | INTRAVENOUS | Status: AC
  Administered 2023-03-07: 2 g via INTRAVENOUS
  Filled 2023-03-07: qty 50

## 2023-03-07 MED ORDER — ACETAZOLAMIDE ER 500 MG PO CP12
500.0000 mg | ORAL_CAPSULE | Freq: Two times a day (BID) | ORAL | Status: DC
  Administered 2023-03-07 (×2): 500 mg via ORAL
  Filled 2023-03-07 (×3): qty 1

## 2023-03-07 MED ORDER — POTASSIUM CHLORIDE CRYS ER 20 MEQ PO TBCR
40.0000 meq | EXTENDED_RELEASE_TABLET | Freq: Two times a day (BID) | ORAL | Status: AC
  Administered 2023-03-07 (×2): 40 meq via ORAL
  Filled 2023-03-07 (×2): qty 2

## 2023-03-07 NOTE — Progress Notes (Signed)
Mobility Specialist Progress Note:   03/07/23 1610  Mobility  Activity Ambulated with assistance in hallway  Level of Assistance Contact guard assist, steadying assist  Assistive Device  (IV Pole)  Distance Ambulated (ft) 100 ft  Activity Response Tolerated well  Mobility Referral Yes  $Mobility charge 1 Mobility  Mobility Specialist Start Time (ACUTE ONLY) 1545  Mobility Specialist Stop Time (ACUTE ONLY) 1605  Mobility Specialist Time Calculation (min) (ACUTE ONLY) 20 min   Pre Mobility: 97 HR  During Mobility: 112 HR , 93% SpO2 RA Post Mobility: 87 HR   Pt received in chair, agreeable to mobility. Pt displayed slight fatigue towards EOS, otherwise asymptomatic throughout. Pt left EOB with call bell in reach and all needs met.   Richard [name]  Mobility Specialist Please contact via [employer] office at [phone]

## 2023-03-07 NOTE — Progress Notes (Signed)
TRH night cross cover note:   I was notified by RN of the patient's serum potassium level this morning of 3.0.  I subsequently placed order for potassium chloride 40 meq p.o. x 1 dose now.  Additionally, the patient is undergoing continuous glucose monitoring.  I subsequently added associated before every meal and at bedtime CBG monitoring/documentation as well as associated low-dose sliding scale insulin.    Newton Pigg, DO Hospitalist

## 2023-03-07 NOTE — TOC Progression Note (Signed)
Transition of Care [facility]) - Progression Note    Patient Details  Name: Richard [name] MRN: [id number] Date of Birth: [birth year]  Transition of Care [facility] CM/SW Contact  Nicanor Bake Phone Number: [phone] 03/07/2023, 1:58 PM  Clinical Narrative:   HF CSW met with pt at bedside. Pt stated that he is uninsured and cannot afford medications. CSW asked the pt if it was ok to send information to financial counselors. Pt agreed. CSW sent the pts information to financial counselors and will be waiting for updates.   TOC will continue following.     Expected Discharge Plan: Home/Self Care Barriers to Discharge: Continued Medical Work up  Expected Discharge Plan and Services   Discharge Planning Services: CM Consult   Living arrangements for the past 2 months: Single Family Home                                       Social Determinants of Health (SDOH) Interventions SDOH Screenings   Food Insecurity: No Food Insecurity (03/04/2023)  Housing: Medium Risk (03/04/2023)  Transportation Needs: No Transportation Needs (03/04/2023)  Utilities: Not At Risk (03/04/2023)  Alcohol Screen: Low Risk  (11/13/2021)  Depression (PHQ2-9): Low Risk  (11/24/2021)  Financial Resource Strain: Low Risk  (11/13/2021)  Physical Activity: Sufficiently Active (11/24/2021)  Social Connections: Moderately Integrated (11/24/2021)  Stress: No Stress Concern Present (11/24/2021)  Tobacco Use: Low Risk  (03/04/2023)    Readmission Risk Interventions     No data to display

## 2023-03-07 NOTE — Progress Notes (Addendum)
Progress Note   Patient: Richard [name] [id number] DOB: 1970-06-26 DOA: 03/04/2023     3 DOS: the patient was seen and examined on 03/07/2023   Brief hospital course: Richard [name] was admitted to the hospital with the working diagnosis of heart failure decompensation.   51/M w severe biventricular failure EF less than 20% with severely reduced RV on TEE 6/23, CKD 3B hypertension, prediabetes, CVA, atrial fibrillation, OSA, obesity, presented with worsening shortness of breath. - reported 3 days of progressive shortness of breath, lower extremity edema, abdominal distention, reports compliance with meds -In the ED, tachypneic, vital signs stable -Labs noted creatinine of 2.6, BNP 1563, troponin 48, hemoglobin 11, EKG with sinus arrhythmia, chest x-ray with patchy opacities at right and left lung base  10/02 picc line placed and patient started on Milrinone for low output heart failure.   Assessment and Plan: Acute on chronic systolic (congestive) heart failure (HCC) Echocardiogram with reduced LV systolic function 25%, global hypokinesis, LV cavity with moderate dilatation, RV systolic function with mild reduction, RVSP 45.9 mmHg. LA and RA with mild dilatation.   Low output heart failure.   Urine output 8,750  Systolic blood pressure 133 to 147  SV02 62,4   Continue with milrinone 0,25 mcg/kg/hr Furosemide drip 15 mg/hr Acetazolamide.  After load reduction with Bidil.   Essential hypertension, benign Continue blood pressure control with bidil.  Aggressive diuresis.    Persistent atrial fibrillation (HCC) Continue rate control with amiodarone, anticoagulation with apixaban.   Stage 3b chronic kidney disease (HCC) Hypokalemia.   Serum cr today is 2,68 with K at 3,3 and serum bicarbonate at 31.  Na 136 Mg 1,9   Plan to continue diuresis with furosemide, metolazone, SGLT 2 inh.  Kcl 120 meq today and Mg 2 g  Follow up renal function in am.   Type 2 diabetes mellitus with  hyperlipidemia (HCC) Continue glucose cover and monitoring with insulin sliding scale. Continue statin therapy.   History of CVA (cerebrovascular accident) Continue blood pressure control and anticoagulation.   Obesity, diabetes, and hypertension syndrome (HCC) Calculated BMI 48,6         Subjective: Patient with improvement in dyspnea and edema, not yet back to baseline, no chest pain.   Physical Exam: Vitals:   03/06/23 2359 03/07/23 0449 03/07/23 0741 03/07/23 1105  BP: (!) 143/79 (!) 153/82 (!) 147/104 (!) 133/93  Pulse: 80 79 86 88  Resp: 18 19 18 18   Temp: 97.9 F (36.6 C) 97.7 F (36.5 C) (!) 97.5 F (36.4 C) (!) 97.4 F (36.3 C)  TempSrc: Oral Oral Oral Oral  SpO2: 95% 96% 95% 96%  Weight:  (!) 151.8 kg    Height:       Neurology awake and alert ENT with mild pallor Cardiovascular with S1 and S2 present and regular with no gallops, rubs or murmurs No JVD (wide neck).  Positive lower extremity edema ++ Respiratory with  mild rales at bases with no wheezing or rhonchi Abdomen with no distention  Data Reviewed:    Family Communication: no family at the bedside   Disposition: Status is: Inpatient Remains inpatient appropriate because: heart failure   Planned Discharge Destination: Home    Author: Coralie Keens, MD 03/07/2023 2:50 PM  For on call review [email or web address].

## 2023-03-07 NOTE — Progress Notes (Signed)
Advanced Heart Failure Rounding Note  PCP-Cardiologist: Christell Constant, MD   Subjective:   Admitted with volume overload.  10/2: Diuresed with diamox and switched to lasix drip. Bidil increased. CO-OX 44%. Started on Milrinone 0.25 mcg.   Brisk diuresis overnight. Negative 8 liters. Weight down 14 pounds.   On milrinone 0.25 mcg. CO-OX 62%.   Creatinine 2.67>2.78>2.87      Remains short of breath with exertion.   Objective:   Weight Range: (!) 151.8 kg Body mass index is 46.67 kg/m.   Vital Signs:   Temp:  [97.7 F (36.5 C)-98.2 F (36.8 C)] 97.7 F (36.5 C) (10/03 0449) Pulse Rate:  [79-92] 79 (10/03 0449) Resp:  [18-19] 19 (10/03 0449) BP: (124-153)/(79-105) 153/82 (10/03 0449) SpO2:  [95 %-96 %] 96 % (10/03 0449) Weight:  [151.8 kg] 151.8 kg (10/03 0449) Last BM Date : 03/06/23  Weight change: Filed Weights   03/05/23 1329 03/06/23 0500 03/07/23 0449  Weight: (!) 159.1 kg (!) 158.1 kg (!) 151.8 kg    Intake/Output:   Intake/Output Summary (Last 24 hours) at 03/07/2023 0713 Last data filed at 03/07/2023 0651 Gross per 24 hour  Intake 103.87 ml  Output 8751 ml  Net -8647.13 ml    CVP 15-16.   Physical Exam    General:  Sitting in the chair. No resp difficulty HEENT: normal Neck: supple. JVP to jaw . Carotids 2+ bilat; no bruits. No lymphadenopathy or thryomegaly appreciated. Cor: PMI nondisplaced. Regular rate & rhythm. No rubs, gallops or murmurs. Lungs: clear Abdomen: soft, nontender, distended. No hepatosplenomegaly. No bruits or masses. Good bowel sounds. Extremities: no cyanosis, clubbing, rash, R and LLE 2+ edema + unna boots  Neuro: alert & orientedx3, cranial nerves grossly intact. moves all 4 extremities w/o difficulty. Affect pleasant  Telemetry  SR 80-90s   EKG   N/A   Labs    CBC Recent Labs    03/05/23 0225 03/06/23 0303  WBC 5.9 7.1  HGB 11.6* 12.7*  HCT 37.9* 41.5  MCV 97.4 93.9  PLT 180 203   Basic  Metabolic Panel Recent Labs    78/46/96 0303 03/06/23 1409 03/07/23 0518  NA 139 138 134*  K 3.7 3.9 3.0*  CL 101 99 95*  CO2 27 25 29   GLUCOSE 162* 185* 286*  BUN 27* 28* 29*  CREATININE 2.78* 2.76* 2.87*  CALCIUM 8.7* 8.6* 8.1*  MG 1.8  --  1.6*  PHOS  --  4.4  --    Liver Function Tests Recent Labs    03/04/23 1253 03/05/23 0225 03/06/23 1409  AST 20 18  --   ALT 18 20  --   ALKPHOS 38 34*  --   BILITOT 0.8 1.2  --   PROT 6.8 6.7  --   ALBUMIN 2.9* 2.8* 3.1*   No results for input(s): "LIPASE", "AMYLASE" in the last 72 hours. Cardiac Enzymes No results for input(s): "CKTOTAL", "CKMB", "CKMBINDEX", "TROPONINI" in the last 72 hours.  BNP: BNP (last 3 results) Recent Labs    03/04/23 1300  BNP 1,563.8*    ProBNP (last 3 results) No results for input(s): "PROBNP" in the last 8760 hours.   D-Dimer No results for input(s): "DDIMER" in the last 72 hours. Hemoglobin A1C Recent Labs    03/05/23 0222  HGBA1C 8.4*   Fasting Lipid Panel No results for input(s): "CHOL", "HDL", "LDLCALC", "TRIG", "CHOLHDL", "LDLDIRECT" in the last 72 hours. Thyroid Function Tests No results for input(s): "TSH", "T4TOTAL", "  T3FREE", "THYROIDAB" in the last 72 hours.  Invalid input(s): "FREET3"  Other results:   Imaging    [place] EKG SITE RITE  Result Date: 03/06/2023 If Site Rite image not attached, placement could not be confirmed due to current cardiac rhythm.    Medications:     Scheduled Medications:  amiodarone  200 mg Oral Daily   apixaban  5 mg Oral BID   atorvastatin  40 mg Oral Daily   Chlorhexidine Gluconate Cloth  6 each Topical Daily   dapagliflozin propanediol  10 mg Oral Daily   insulin aspart  0-9 Units Subcutaneous TID WC   isosorbide-hydrALAZINE  2 tablet Oral TID   potassium chloride  40 mEq Oral BID   sodium chloride flush  10-40 mL Intracatheter Q12H   sodium chloride flush  3 mL Intravenous Q12H    Infusions:  sodium chloride 10 mL/hr at  03/05/23 1836   furosemide (LASIX) 200 mg in dextrose 5 % 100 mL (2 mg/mL) infusion 15 mg/hr (03/07/23 0234)   milrinone 0.25 mcg/kg/min (03/07/23 0653)    PRN Medications: sodium chloride, acetaminophen **OR** acetaminophen, polyethylene glycol, sodium chloride flush    Patient Profile   51/M w severe biventricular systolic heart failure EF less than 20% with severely reduced RV on TEE 6/23, NICM, CKD 3B, poorly controlled hypertension, prediabetes, CVA, atrial fibrillation, OSA, obesity, CHF presented with worsening shortness of breath.   Admitted with A/C HFrEF.   Assessment/Plan   1. Acute on Chronic Biventricular Systolic Heart Failure - NICM, likely 2/2 poorly controlled HTN + tachymediated from rapid Afib . - Echo (2014): EF 25-30%, LHC no CAD  - Echo (4/17): EF 20-25%, RV mild dilated with moderately reduced systolic function, severe LAE - SPEP negative, no M spike. UPEP negative in 2017  - Echo (10/17): EF normalized, 50-55% - Echo (12/20): EF 50-55% - Echo (6/23): EF 25-30%, global HK, RV normal. In setting of new Afib w/  RVR, uncontrolled hypertension and poor compliance w/ meds  - Not current candidate for LHC given CKD w/ b/l SCr >2 - NYHA IV, marked volume overload. Given 120 mg IV Lasix and started on 80mg  IV Lasix BID in ED.  - Echo EF 25% RV mildly reduced.  -  Yesterday CO-OX  44%. Started on milrinone 0.25 mcg. CO-OX up to 62% - CVP 15-16. Brisk diuresis noted. Continue lasix drip at 15 mg per hour . Given diamox 500 mg twice a day.  Supp K and replace Mag.   - BB on hold in setting of decompensated HF - Continue Bidil 2 tab three times day.  -  Continue  farxiga 10 mg daily.  - No dig, arni, or mra with worsening renal function.   2. H/o PAF  - Diagnosed 6/23 - Sleep study with moderate OSA 8/24 - s/p multiple DCCV to NSR w/ ERAF (last 11/30/21) - Maintaining SR.  - Continue amiodarone 200 mg daily.  - Continue Eliquis 5 mg BID.  - Followed in EP  Clinic. Needs to be considered for Afib ablation. LA diameter 4.40 cm   3. Hypertension  - Poorly controlled.  - Continue Bidil 2 tab three times a day.  - Consider amlodipine if remains elevated.    4. CKD Stage IV - Suspect hypertensive/ DM nephropathy  - Prev followed by Nephrology but lost to f/u will need to re-establish.  - Baseline SCr >2, unclear baseline - Continue Farxiga 10 mg daily - He has been referred to [facility].  -  On admit Scr 2.67 (last 4/24 1.97)-->2.78-->2.87   - Denies taking recent NSAIDs   5. Type 2 DM  - Recent Hgb A1c 6.4.  - Continue farxiga 10 mg daily.    6. H/o CVA  - Cryptogenetic (2020). Refused loop recorder. - PTA he was out of eliquis. Restarted eliquis on admit.      7. OSA - Sleep study + for OSA on 8/24. Pt states he was not aware of results. - CPAP recommended   Consult dietitian. He is uninsured and will need patient assistance for medications.   Length of Stay: 3  Isidoro Santillana, NP  03/07/2023, 7:13 AM  Advanced Heart Failure Team Pager [phone] (M-F; 7a - 5p)  Please contact [facility] for night-coverage after hours (5p -7a ) and weekends on [email or web address]

## 2023-03-07 NOTE — Plan of Care (Signed)
  Problem: Education: Goal: Ability to demonstrate management of disease process will improve Outcome: Progressing   Problem: Activity: Goal: Capacity to carry out activities will improve Outcome: Progressing   

## 2023-03-08 ENCOUNTER — Telehealth (HOSPITAL_COMMUNITY): Payer: Self-pay | Admitting: Pharmacy Technician

## 2023-03-08 ENCOUNTER — Other Ambulatory Visit (HOSPITAL_COMMUNITY): Payer: Self-pay

## 2023-03-08 LAB — BASIC METABOLIC PANEL
Anion gap: 13 (ref 5–15)
Anion gap: 11 (ref 5–15)
BUN: 28 mg/dL — ABNORMAL HIGH (ref 6–20)
BUN: 28 mg/dL — ABNORMAL HIGH (ref 6–20)
CO2: 28 mmol/L (ref 22–32)
CO2: 29 mmol/L (ref 22–32)
Calcium: 8.2 mg/dL — ABNORMAL LOW (ref 8.9–10.3)
Calcium: 8.5 mg/dL — ABNORMAL LOW (ref 8.9–10.3)
Chloride: 92 mmol/L — ABNORMAL LOW (ref 98–111)
Chloride: 94 mmol/L — ABNORMAL LOW (ref 98–111)
Creatinine, Ser: 2.61 mg/dL — ABNORMAL HIGH (ref 0.61–1.24)
Creatinine, Ser: 2.58 mg/dL — ABNORMAL HIGH (ref 0.61–1.24)
GFR, Estimated: 29 mL/min — ABNORMAL LOW (ref >60)
GFR, Estimated: 29 mL/min — ABNORMAL LOW (ref >60)
Glucose, Bld: 291 mg/dL — ABNORMAL HIGH (ref 70–99)
Glucose, Bld: 253 mg/dL — ABNORMAL HIGH (ref 70–99)
Potassium: 3 mmol/L — ABNORMAL LOW (ref 3.5–5.1)
Potassium: 3.4 mmol/L — ABNORMAL LOW (ref 3.5–5.1)
Sodium: 133 mmol/L — ABNORMAL LOW (ref 135–145)
Sodium: 134 mmol/L — ABNORMAL LOW (ref 135–145)

## 2023-03-08 LAB — GLUCOSE, CAPILLARY
Glucose-Capillary: 174 mg/dL — ABNORMAL HIGH (ref 70–99)
Glucose-Capillary: 238 mg/dL — ABNORMAL HIGH (ref 70–99)
Glucose-Capillary: 169 mg/dL — ABNORMAL HIGH (ref 70–99)
Glucose-Capillary: 175 mg/dL — ABNORMAL HIGH (ref 70–99)

## 2023-03-08 LAB — RENAL FUNCTION PANEL
Albumin: 3.1 g/dL — ABNORMAL LOW (ref 3.5–5.0)
Anion gap: 12 (ref 5–15)
BUN: 28 mg/dL — ABNORMAL HIGH (ref 6–20)
CO2: 29 mmol/L (ref 22–32)
Calcium: 8.5 mg/dL — ABNORMAL LOW (ref 8.9–10.3)
Chloride: 93 mmol/L — ABNORMAL LOW (ref 98–111)
Creatinine, Ser: 2.49 mg/dL — ABNORMAL HIGH (ref 0.61–1.24)
GFR, Estimated: 30 mL/min — ABNORMAL LOW (ref >60)
Glucose, Bld: 252 mg/dL — ABNORMAL HIGH (ref 70–99)
Phosphorus: 3.5 mg/dL (ref 2.5–4.6)
Potassium: 3.4 mmol/L — ABNORMAL LOW (ref 3.5–5.1)
Sodium: 134 mmol/L — ABNORMAL LOW (ref 135–145)

## 2023-03-08 LAB — COOXEMETRY PANEL
Carboxyhemoglobin: 3.2 % — ABNORMAL HIGH (ref 0.5–1.5)
Carboxyhemoglobin: 3.5 % — ABNORMAL HIGH (ref 0.5–1.5)
Methemoglobin: 0.7 % (ref 0.0–1.5)
Methemoglobin: 0.7 % (ref 0.0–1.5)
O2 Saturation: 63.9 %
O2 Saturation: 78.8 %
Total hemoglobin: 13.6 g/dL (ref 12.0–16.0)
Total hemoglobin: 13.1 g/dL (ref 12.0–16.0)

## 2023-03-08 LAB — MAGNESIUM: Magnesium: 1.8 mg/dL (ref 1.7–2.4)

## 2023-03-08 MED ORDER — MAGNESIUM SULFATE 2 GM/50ML IV SOLN
2.0000 g | Freq: Once | INTRAVENOUS | Status: AC
  Administered 2023-03-08: 2 g via INTRAVENOUS
  Filled 2023-03-08: qty 50

## 2023-03-08 MED ORDER — POTASSIUM CHLORIDE CRYS ER 20 MEQ PO TBCR
40.0000 meq | EXTENDED_RELEASE_TABLET | Freq: Two times a day (BID) | ORAL | Status: AC
  Administered 2023-03-08 (×2): 40 meq via ORAL
  Filled 2023-03-08 (×2): qty 2

## 2023-03-08 MED ORDER — MILRINONE LACTATE IN DEXTROSE 20-5 MG/100ML-% IV SOLN
0.1250 ug/kg/min | INTRAVENOUS | Status: AC
  Administered 2023-03-08: 0.125 ug/kg/min via INTRAVENOUS
  Filled 2023-03-08: qty 100

## 2023-03-08 MED ORDER — TORSEMIDE 20 MG PO TABS
60.0000 mg | ORAL_TABLET | Freq: Every day | ORAL | Status: DC
  Administered 2023-03-08 – 2023-03-12 (×5): 60 mg via ORAL
  Filled 2023-03-08 (×5): qty 3

## 2023-03-08 NOTE — Plan of Care (Signed)
Nutrition Education Note  RD consulted for nutrition education regarding CHF and diabetes.  Lab Results  Component Value Date   HGBA1C 8.4 (H) 03/05/2023     RD provided "Heart Healthy, Consistent Carbohydrate Nutrition Therapy" and "Plate Method" handout from the Academy of Nutrition and Dietetics.   Pt reports that he had been working 2 full time jobs which kept him really busy and his health/sleep/exercise/nutrition got put on the back burner. He has had prior nutrition education which was helpful but he recalls being off track.   Reviewed patient's dietary recall. He states that he eats out more often than at home d/t busy schedule. He often grabs fruit in the morning on his way out of the house. For lunch and dinner he recalls ordering from places such as the [employer], [employer], [address], [employer], [employer], [employer], and [place]. PTA he was trying to eat smaller portion sizes.   Provided examples on ways to decrease sodium intake in diet. Discouraged intake of processed foods and use of salt shaker. Encouraged fresh fruits and vegetables as well as whole grain sources of carbohydrates to maximize fiber intake.   RD discussed why it is important for patient to adhere to diet recommendations, and emphasized the role of fluids, foods to avoid, and importance of weighing self daily.   Discussed different food groups and their effects on blood sugar, emphasizing carbohydrate-containing foods. Provided list of carbohydrates and recommended serving sizes of common foods.  Discussed importance of controlled and consistent carbohydrate intake throughout the day. Provided examples of ways to balance meals/snacks and encouraged intake of high-fiber, whole grain complex carbohydrates. Teach back method used.  Expect good compliance at pt appears motivated to improve his health. Recommend ongoing outpatient diet education. Pt is agreeable to referral.   Body mass  index is 44.42 kg/m. Pt meets criteria for morbid obesity based on current BMI.  Current diet order is heart healthy, 1.8L fluid restriction, patient is consuming approximately 100% of meals at this time. Labs and medications reviewed. No further nutrition interventions warranted at this time. RD contact information provided. If additional nutrition issues arise, please re-consult RD.   Drusilla Kanner, RDN, LDN Clinical Nutrition

## 2023-03-08 NOTE — Telephone Encounter (Addendum)
Advanced Heart Failure Patient Nurse, mental health in [employer] application for [employer] assistance via fax. Document scanned to chart.   Will follow up.

## 2023-03-08 NOTE — Plan of Care (Signed)
  Problem: Activity: Goal: Capacity to carry out activities will improve Outcome: Progressing   Problem: Cardiac: Goal: Ability to achieve and maintain adequate cardiopulmonary perfusion will improve Outcome: Progressing   

## 2023-03-08 NOTE — Progress Notes (Signed)
Advanced Heart Failure Rounding Note  PCP-Cardiologist: Christell Constant, MD   Subjective:   Admitted with volume overload.  10/2: Diuresed with diamox and switched to lasix drip. Bidil increased. CO-OX 44%. Started on Milrinone 0.25 mcg.   Brisk diuresis overnight. Negative 9.6L, 11.2L UOP. Weight down 16 pounds, overall down 32lbs. CVP 6  On milrinone 0.25 mcg. CO-OX 62%.   Creatinine 2.67>2.78>2.8>2.68>2.61  Breathing has improved today. Feels better. Denies CP/SOB.   Objective:   Weight Range: (!) 144.5 kg Body mass index is 44.42 kg/m.   Vital Signs:   Temp:  [97.4 F (36.3 C)-98.1 F (36.7 C)] 97.5 F (36.4 C) (10/04 0451) Pulse Rate:  [86-107] 92 (10/04 0451) Resp:  [17-19] 17 (10/04 0451) BP: (130-157)/(76-118) 130/76 (10/04 0451) SpO2:  [93 %-99 %] 93 % (10/04 0451) Weight:  [144.5 kg] 144.5 kg (10/04 0106) Last BM Date : 03/06/23  Weight change: Filed Weights   03/06/23 0500 03/07/23 0449 03/08/23 0106  Weight: (!) 158.1 kg (!) 151.8 kg (!) 144.5 kg    Intake/Output:   Intake/Output Summary (Last 24 hours) at 03/08/2023 0714 Last data filed at 03/08/2023 0454 Gross per 24 hour  Intake 1520.19 ml  Output 16109 ml  Net -9679.81 ml    CVP 6  Physical Exam    General:  well appearing.  No respiratory difficulty HEENT: normal Neck: supple. JVD ~7 cm. Carotids 2+ bilat; no bruits. No lymphadenopathy or thyromegaly appreciated. Cor: PMI nondisplaced. Regular rate & rhythm. No rubs, gallops or murmurs. Lungs: clear Abdomen: soft, nontender, nondistended. No hepatosplenomegaly. No bruits or masses. Good bowel sounds. Extremities: no cyanosis, clubbing, rash, trace BLE edema. + UNNA boots. PICC RUE Neuro: alert & oriented x 3, cranial nerves grossly intact. moves all 4 extremities w/o difficulty. Affect pleasant.   Telemetry  NSR 90s 10-16 PVCs/hr (Personally reviewed)    EKG   N/A   Labs    CBC Recent Labs    03/06/23 0303  WBC 7.1   HGB 12.7*  HCT 41.5  MCV 93.9  PLT 203   Basic Metabolic Panel Recent Labs    60/45/40 1409 03/07/23 0518 03/07/23 1300 03/08/23 0515  NA 138   < > 136 133*  K 3.9   < > 3.3* 3.0*  CL 99   < > 94* 92*  CO2 25   < > 31 28  GLUCOSE 185*   < > 290* 291*  BUN 28*   < > 26* 28*  CREATININE 2.76*   < > 2.68* 2.61*  CALCIUM 8.6*   < > 8.4* 8.2*  MG  --    < > 1.9 1.8  PHOS 4.4  --   --   --    < > = values in this interval not displayed.   Liver Function Tests Recent Labs    03/06/23 1409  ALBUMIN 3.1*   No results for input(s): "LIPASE", "AMYLASE" in the last 72 hours. Cardiac Enzymes No results for input(s): "CKTOTAL", "CKMB", "CKMBINDEX", "TROPONINI" in the last 72 hours.  BNP: BNP (last 3 results) Recent Labs    03/04/23 1300  BNP 1,563.8*    ProBNP (last 3 results) No results for input(s): "PROBNP" in the last 8760 hours.   D-Dimer No results for input(s): "DDIMER" in the last 72 hours. Hemoglobin A1C No results for input(s): "HGBA1C" in the last 72 hours.  Fasting Lipid Panel No results for input(s): "CHOL", "HDL", "LDLCALC", "TRIG", "CHOLHDL", "LDLDIRECT" in the last 72  hours. Thyroid Function Tests No results for input(s): "TSH", "T4TOTAL", "T3FREE", "THYROIDAB" in the last 72 hours.  Invalid input(s): "FREET3"  Other results:   Imaging    No results found.   Medications:     Scheduled Medications:  acetaZOLAMIDE ER  500 mg Oral BID   amiodarone  200 mg Oral Daily   apixaban  5 mg Oral BID   atorvastatin  40 mg Oral Daily   Chlorhexidine Gluconate Cloth  6 each Topical Daily   dapagliflozin propanediol  10 mg Oral Daily   insulin aspart  0-9 Units Subcutaneous TID WC   isosorbide-hydrALAZINE  2 tablet Oral TID   potassium chloride  40 mEq Oral BID   sodium chloride flush  10-40 mL Intracatheter Q12H   sodium chloride flush  3 mL Intravenous Q12H    Infusions:  sodium chloride 10 mL/hr at 03/05/23 1836   furosemide (LASIX) 200 mg  in dextrose 5 % 100 mL (2 mg/mL) infusion 15 mg/hr (03/08/23 0354)   magnesium sulfate bolus IVPB 2 g (03/08/23 0701)   milrinone 0.25 mcg/kg/min (03/08/23 0059)    PRN Medications: sodium chloride, acetaminophen **OR** acetaminophen, polyethylene glycol, sodium chloride flush  Patient Profile   51/M w severe biventricular systolic heart failure EF less than 20% with severely reduced RV on TEE 6/23, NICM, CKD 3B, poorly controlled hypertension, prediabetes, CVA, atrial fibrillation, OSA, obesity, CHF presented with worsening shortness of breath.   Admitted with A/C HFrEF.   Assessment/Plan   1. Acute on Chronic Biventricular Systolic Heart Failure - NICM, likely 2/2 poorly controlled HTN + tachymediated from rapid Afib . - Echo (2014): EF 25-30%, LHC no CAD  - Echo (4/17): EF 20-25%, RV mild dilated with moderately reduced systolic function, severe LAE - SPEP negative, no M spike. UPEP negative in 2017  - Echo (10/17): EF normalized, 50-55% - Echo (12/20): EF 50-55% - Echo (6/23): EF 25-30%, global HK, RV normal. In setting of new Afib w/  RVR, uncontrolled hypertension and poor compliance w/ meds  - Not current candidate for LHC given CKD w/ b/l SCr >2 - NYHA IV, marked volume overload. Given 120 mg IV Lasix and started on 80mg  IV Lasix BID in ED.  - Echo EF 25% RV mildly reduced.  -  10/2 CO-OX  44%. Started on milrinone 0.25 mcg. CO-OX up to 64%. Wean milrinone to 0.149mcg.  - CVP 6. Brisk diuresis. Stop lasix gtt and diamox. Will replete K this morning and start Torsemide 60 mg daily later today.  - BB on hold in setting of decompensated HF - Continue Bidil 2 tab three times day.  - Continue farxiga 10 mg daily.  - No dig, arni, or mra with worsening renal function.   2. H/o PAF  - Diagnosed 6/23 - Sleep study with moderate OSA 8/24 - s/p multiple DCCV to NSR w/ ERAF (last 11/30/21) - Maintaining SR.  - Continue amiodarone 200 mg daily.  - Continue Eliquis 5 mg BID.  -  Followed in [facility] Clinic. Needs to be considered for Afib ablation. LA diameter 4.40 cm   3. Hypertension  - Poorly controlled.  - Continue Bidil 2 tab TID.  - Consider amlodipine if remains elevated.    4. CKD Stage IV - Suspect hypertensive/ DM nephropathy  - Prev followed by Nephrology but lost to f/u will need to re-establish.  - SCr >2 on admission, unclear baseline - Continue Farxiga 10 mg daily - He has been referred to [facility].  -  On admit Scr 2.67 (last 4/24 1.97)-->2.78-->2.87>2.68>2.61   - Denies taking recent NSAIDs   5. Type 2 DM  - Recent Hgb A1c 6.4.  - Continue farxiga 10 mg daily.    6. H/o CVA  - Cryptogenetic (2020). Refused loop recorder. - PTA he was out of eliquis. Restarted eliquis on admit.      7. OSA - Sleep study + for OSA on 8/24. Pt states he was not aware of results. - CPAP recommended  8. Hypokelamia - 2/2 diuresis - K 3, replete - repeat BMET later today   He is uninsured and will need patient assistance for medications.   Length of Stay: 4  Alen Bleacher, NP  03/08/2023, 7:14 AM  Advanced Heart Failure Team Pager [phone] (M-F; 7a - 5p)  Please contact [facility] for night-coverage after hours (5p -7a ) and weekends on [email or web address]

## 2023-03-08 NOTE — Telephone Encounter (Signed)
Advanced Heart Failure Patient Advocate Encounter  Received assistance applications for Eliquis, Entresto and [place]. These medications were started while the patient was admitted. The patient is currently uninsured. The patient is currently over the income limit for assistance on Eliquis and Farxiga.   Can submit Entresto assistance application to [employer]. Patient will need POI in order to gain approval. Will inform provider for advice.  Archer Asa, CPhT

## 2023-03-08 NOTE — Plan of Care (Signed)

## 2023-03-08 NOTE — Progress Notes (Signed)
Progress Note   Patient: Richard [name] YTK:[id number] DOB: 11/10/1970 DOA: 03/04/2023     4 DOS: the patient was seen and examined on 03/08/2023   Brief hospital course: Richard [name] was admitted to the hospital with the working diagnosis of heart failure decompensation.   51/M w severe biventricular failure EF less than 20% with severely reduced RV on TEE 6/23, CKD 3B hypertension, prediabetes, CVA, atrial fibrillation, OSA, obesity, presented with worsening shortness of breath. - reported 3 days of progressive shortness of breath, lower extremity edema, abdominal distention, reports compliance with meds -In the ED, tachypneic, vital signs stable -Labs noted creatinine of 2.6, BNP 1563, troponin 48, hemoglobin 11, EKG with sinus arrhythmia, chest x-ray with patchy opacities at right and left lung base  10/02 picc line placed and patient started on Milrinone for low output heart failure.   Assessment and Plan: Acute on chronic systolic (congestive) heart failure (HCC) Echocardiogram with reduced LV systolic function 25%, global hypokinesis, LV cavity with moderate dilatation, RV systolic function with mild reduction, RVSP 45.9 mmHg. LA and RA with mild dilatation.   Low output heart failure.   Urine output 11,200 ml   Systolic blood pressure 138 to 118  SV02 78.8    Decrease milrinone 0,125 mcg/kg/hr Continue with acetazolamide and transition to oral torsemide.  After load reduction with Bidil.   Essential hypertension, benign Continue blood pressure control with bidil.    Persistent atrial fibrillation (HCC) Continue rate control with amiodarone, anticoagulation with apixaban.   Stage 3b chronic kidney disease (HCC) Hypokalemia. Hyponatremia.   Renal function today with serum cr at 2,58 with K at 3,4 and serum bicarbonate at 29. Na 134   Patient had metolazone,  Continue with SGLT 2 inh and now transitioned to oral loop diuretic.  Continue K correction with Kcl 80 meq in 2  divided doses.  2 g mag sulfate  Follow up renal function in am.   Type 2 diabetes mellitus with hyperlipidemia (HCC) Continue glucose cover and monitoring with insulin sliding scale. Continue statin therapy.   History of CVA (cerebrovascular accident) Continue blood pressure control and anticoagulation.   Obesity, diabetes, and hypertension syndrome (HCC) Calculated BMI 48,6         Subjective: Patient with improvement in dyspnea and edema, no chest pain   Physical Exam: Vitals:   03/08/23 0106 03/08/23 0451 03/08/23 0818 03/08/23 1202  BP:  130/76 131/63 118/67  Pulse:  92 100 92  Resp:  17 18 16   Temp:  (!) 97.5 F (36.4 C) 98.2 F (36.8 C) (!) 97.5 F (36.4 C)  TempSrc:  Oral Oral Oral  SpO2:  93% 96% 99%  Weight: (!) 144.5 kg     Height:       Neurology awake and alert ENT with no pallor Cardiovascular with S1 and S2 present and regular with no gallops, rubs or murmurs Respiratory with no rales or wheezing, no rhonchi Abdomen with no distention Lower extremity edema ++ pitting, unna boots in place, warm extremities.  Data Reviewed:    Family Communication: no family at the bedside   Disposition: Status is: Inpatient Remains inpatient appropriate because: heart failure on IV inotropic support   Planned Discharge Destination: Home     Author: Coralie Keens, MD 03/08/2023 3:33 PM  For on call review [email or web address].

## 2023-03-08 NOTE — Progress Notes (Signed)
Mobility Specialist Progress Note:   03/08/23 1453  Mobility  Activity Refused mobility   Pt refused mobility d/t RLE pain. Will f/u as able.    Leory Plowman  Mobility Specialist Please contact via [employer] office at [phone]

## 2023-03-08 NOTE — Progress Notes (Signed)
Notified Dr. Ella Jubilee that patient is complaining of pain in right ankle from "bumping it on bedside table". Dr. Ella Jubilee states that he will assess when unna boots are removed on either Saturday or Sunday. No further orders received at this time.

## 2023-03-08 NOTE — TOC Progression Note (Addendum)
Transition of Care [facility]) - Progression Note    Patient Details  Name: Richard [name] MRN: [id number] Date of Birth: 06/11/1970  Transition of Care [facility] CM/SW Contact  Rushil Cousin, RN Phone Number: [phone] 03/08/2023, 2:19 PM  Clinical Narrative:   PCP follow up appt scheduled for [facility] 04/02/2023 at 930 am. Pt can use the pharmacy for his outpt meds. Request meds be filled in TOC on Sat if plan dc on Sunday. Message sent to provider.   Pt was screened for Medicaid. Meds to be covered under HF fund, approved by HF pharmacist.     Expected Discharge Plan: Home/Self Care Barriers to Discharge: Continued Medical Work up  Expected Discharge Plan and Services   Discharge Planning Services: CM Consult   Living arrangements for the past 2 months: Single Family Home                                       Social Determinants of Health (SDOH) Interventions SDOH Screenings   Food Insecurity: No Food Insecurity (03/04/2023)  Housing: Medium Risk (03/04/2023)  Transportation Needs: No Transportation Needs (03/04/2023)  Utilities: Not At Risk (03/04/2023)  Alcohol Screen: Low Risk  (11/13/2021)  Depression (PHQ2-9): Low Risk  (11/24/2021)  Financial Resource Strain: Low Risk  (11/13/2021)  Physical Activity: Sufficiently Active (11/24/2021)  Social Connections: Moderately Integrated (11/24/2021)  Stress: No Stress Concern Present (11/24/2021)  Tobacco Use: Low Risk  (03/04/2023)    Readmission Risk Interventions     No data to display

## 2023-03-09 DIAGNOSIS — I480 Paroxysmal atrial fibrillation: Secondary | ICD-10-CM

## 2023-03-09 LAB — BASIC METABOLIC PANEL
Anion gap: 11 (ref 5–15)
BUN: 28 mg/dL — ABNORMAL HIGH (ref 6–20)
CO2: 27 mmol/L (ref 22–32)
Calcium: 8.6 mg/dL — ABNORMAL LOW (ref 8.9–10.3)
Chloride: 97 mmol/L — ABNORMAL LOW (ref 98–111)
Creatinine, Ser: 2.3 mg/dL — ABNORMAL HIGH (ref 0.61–1.24)
GFR, Estimated: 33 mL/min — ABNORMAL LOW (ref >60)
Glucose, Bld: 162 mg/dL — ABNORMAL HIGH (ref 70–99)
Potassium: 3.7 mmol/L (ref 3.5–5.1)
Sodium: 135 mmol/L (ref 135–145)

## 2023-03-09 LAB — GLUCOSE, CAPILLARY
Glucose-Capillary: 154 mg/dL — ABNORMAL HIGH (ref 70–99)
Glucose-Capillary: 190 mg/dL — ABNORMAL HIGH (ref 70–99)
Glucose-Capillary: 305 mg/dL — ABNORMAL HIGH (ref 70–99)
Glucose-Capillary: 165 mg/dL — ABNORMAL HIGH (ref 70–99)

## 2023-03-09 LAB — COOXEMETRY PANEL
Carboxyhemoglobin: 3.5 % — ABNORMAL HIGH (ref 0.5–1.5)
Carboxyhemoglobin: 3.6 % — ABNORMAL HIGH (ref 0.5–1.5)
Methemoglobin: <0.7 % (ref 0.0–1.5)
Methemoglobin: 0.8 % (ref 0.0–1.5)
O2 Saturation: 58.4 %
O2 Saturation: 58.4 %
Total hemoglobin: 13.3 g/dL (ref 12.0–16.0)
Total hemoglobin: 12.9 g/dL (ref 12.0–16.0)

## 2023-03-09 LAB — MAGNESIUM: Magnesium: 1.9 mg/dL (ref 1.7–2.4)

## 2023-03-09 MED ORDER — MAGNESIUM SULFATE 2 GM/50ML IV SOLN
2.0000 g | Freq: Once | INTRAVENOUS | Status: AC
  Administered 2023-03-09: 2 g via INTRAVENOUS
  Filled 2023-03-09: qty 50

## 2023-03-09 MED ORDER — POTASSIUM CHLORIDE CRYS ER 20 MEQ PO TBCR
40.0000 meq | EXTENDED_RELEASE_TABLET | ORAL | Status: AC
  Administered 2023-03-09 (×2): 40 meq via ORAL
  Filled 2023-03-09 (×2): qty 2

## 2023-03-09 NOTE — Progress Notes (Signed)
Progress Note   Patient: Richard [name] HYQ:[id number] DOB: 04-26-1971 DOA: 03/04/2023     5 DOS: the patient was seen and examined on 03/09/2023   Brief hospital course: Mr. Press was admitted to the hospital with the working diagnosis of heart failure decompensation.   51/M with past medical history of heart failure, CKD 3B hypertension, prediabetes, CVA, atrial fibrillation, OSA, and obesity class 3 who presented with worsening shortness of breath. He reported 3 days of progressive shortness of breath, lower extremity edema, and abdominal distention. On his initial physical examination his blood pressure was 161/120, 179/130, HR 86, RR 36 and 02 saturation 97% on supplemental 02 per [place], lungs with decreased breath sounds, positive rales bilaterally, heart with S1 and S2 present and regular with no gallops or rubs, no murmurs, abdomen with no distention, positive lower extremity edema +++.   Na 138, K 3,2 Cl 106 bicarbonate 16 glucose 186, bun 25 cr 2,67 BNP 1,563  High sensitive troponin 48 and 69  Wbc 6.5 hgb 11,7 plt 182  Sars covid 19 negative.   Chest radiograph with cardiomegaly, with bilateral hilar vascular congestion and cephalization of the vasculature.   EKG 93 bpm, normal axis, qtc 432, normal intervals, sinus rhythm with sinus arrhythmia, and PVC, poor R R wave progression, with no significant ST segment or T wave changes.   Placed on furosemide for diuresis. Noted in low output heart failure.   10/02 picc line placed and patient started on Milrinone for low output heart failure and furosemide drip. 10/05 off milrinone and transitioned to oral loop diuretic therapy.    Assessment and Plan: Acute on chronic systolic (congestive) heart failure (HCC) Echocardiogram with reduced LV systolic function 25%, global hypokinesis, LV cavity with moderate dilatation, RV systolic function with mild reduction, RVSP 45.9 mmHg. LA and RA with mild dilatation.   Low output heart failure.    Urine output 3,500 ml   Systolic blood pressure 125 to 153  SV02 58.8    Off milrinone Discontinued acetazolamide, he also required metolazone.  Continue with dapagliflozin, and oral torsemide.  After load reduction with Bidil.   Essential hypertension, benign Continue blood pressure control with bidil.    Paroxysmal atrial fibrillation (HCC) Continue rate control with amiodarone, anticoagulation with apixaban.   Stage 3b chronic kidney disease (HCC) Hypokalemia. Hyponatremia.   Volume status is improving, renal function today with serum cr at 2,3, K at 3,7 and serum bicarbonate at 27,  Na 135 Mg 1.9   Patient had metolazone and acetazolamide for sequential nephron blockade.  Continue with SGLT 2 inh and torsemide,   Continue K correction with Kcl 2 doses of 40 meq and one dose of 2 g Mag sulfate. Follow up renal function and electrolytes in am.   Type 2 diabetes mellitus with hyperlipidemia (HCC) Continue glucose cover and monitoring with insulin sliding scale. Continue statin therapy.   History of CVA (cerebrovascular accident) Continue blood pressure control and anticoagulation.   Obesity, diabetes, and hypertension syndrome (HCC) Calculated BMI 48,6         Subjective: Patient with no chest pain, dyspnea and edema are improving, he is seating in the chair at the side of the bed.   Physical Exam: Vitals:   03/09/23 0421 03/09/23 0500 03/09/23 0820 03/09/23 1111  BP: (!) 125/101  (!) 143/88 134/84  Pulse: (!) 103  96 93  Resp: 20  15 18   Temp: 98.4 F (36.9 C)  98.5 F (36.9 C) 98.4 F (36.9  C)  TempSrc: Oral  Oral Oral  SpO2: 100%  98% 97%  Weight:  (!) 145.1 kg    Height:       Neurology awake and alert ENT with mild pallor Cardiovascular with S1 and S2 present and regular with no gallops, rubs or murmurs Respiratory with no rales or wheezing, no rhonchi Abdomen with no distention  Positive lower extremity edema + pitting, unna boots in place.   Data Reviewed:    Family Communication: no family at the bedside   Disposition: Status is: Inpatient Remains inpatient appropriate because: recovering low output heart ffailure.   Planned Discharge Destination: Home Plan for dc home on Monday, he needs HF fund for his medications,       Author: Coralie Keens, MD 03/09/2023 2:19 PM  For on call review [email or web address].

## 2023-03-09 NOTE — Progress Notes (Signed)
Mobility Specialist Progress Note:   03/09/23 0900  Mobility  Activity Transferred to/from [facility]  Level of Assistance Contact guard assist, steadying assist  Assistive Device Other (Comment) (IV Pole)  Distance Ambulated (ft) 5 ft  Activity Response Tolerated fair  Mobility Referral Yes  $Mobility charge 1 Mobility  Mobility Specialist Start Time (ACUTE ONLY) 0900  Mobility Specialist Stop Time (ACUTE ONLY) 0910  Mobility Specialist Time Calculation (min) (ACUTE ONLY) 10 min   Pt requesting to transfer to [facility]. Limited by RLE pain, pt ambulated with limp. Pt requested privacy for BM, left with student-RN in room.   Addison Lank Mobility Specialist Please contact via [employer] or  Rehab office at [phone]

## 2023-03-09 NOTE — Progress Notes (Signed)
Received call from telemetry that patient had 12 beat run of VTACH. VS to be obtained at this time.

## 2023-03-09 NOTE — Progress Notes (Signed)
Advanced Heart Failure Rounding Note  PCP-Cardiologist: Christell Constant, MD   Subjective:   Admitted with volume overload.  10/2: Diuresed with diamox and switched to lasix drip. Bidil increased. CO-OX 44%. Started on Milrinone 0.25 mcg.   Now euvolemic on exam, CVP 3-6 on my evaluation.  Mixed venous overnight of 58.  I suspect this is lower than his actual mixed venous.  Serum creatinine has also improved and he feels much better.  Objective:   Weight Range: (!) 145.1 kg Body mass index is 44.62 kg/m.   Vital Signs:   Temp:  [97.3 F (36.3 C)-98.5 F (36.9 C)] 98.4 F (36.9 C) (10/05 1111) Pulse Rate:  [92-103] 93 (10/05 1111) Resp:  [15-20] 18 (10/05 1111) BP: (118-147)/(67-101) 134/84 (10/05 1111) SpO2:  [97 %-100 %] 97 % (10/05 1111) Weight:  [145.1 kg] 145.1 kg (10/05 0500) Last BM Date : 03/07/23  Weight change: Filed Weights   03/07/23 0449 03/08/23 0106 03/09/23 0500  Weight: (!) 151.8 kg (!) 144.5 kg (!) 145.1 kg    Intake/Output:   Intake/Output Summary (Last 24 hours) at 03/09/2023 1139 Last data filed at 03/09/2023 0951 Gross per 24 hour  Intake 985.08 ml  Output 2800 ml  Net -1814.92 ml    CVP 3-5  Physical Exam    General: No complaints sitting at bedside HEENT: normal Neck: supple. JVD ~7 cm. Carotids 2+ bilat; no bruits. No lymphadenopathy or thyromegaly appreciated. Cor: PMI nondisplaced. Regular rate & rhythm. No rubs, gallops or murmurs. Lungs: clear Abdomen: soft, nontender, nondistended. No hepatosplenomegaly. No bruits or masses. Good bowel sounds. Extremities: no cyanosis, clubbing, rash, trace BLE edema. + UNNA boots. PICC RUE Neuro: alert & oriented x 3, cranial nerves grossly intact. moves all 4 extremities w/o difficulty. Affect pleasant.   Telemetry  NSR 80s to 100s  EKG   N/A   Labs    CBC No results for input(s): "WBC", "NEUTROABS", "HGB", "HCT", "MCV", "PLT" in the last 72 hours.  Basic Metabolic  Panel Recent Labs    03/06/23 1409 03/07/23 0518 03/08/23 0515 03/08/23 1300 03/08/23 1400 03/09/23 0508  NA 138   < > 133*   < > 134* 135  K 3.9   < > 3.0*   < > 3.4* 3.7  CL 99   < > 92*   < > 93* 97*  CO2 25   < > 28   < > 29 27  GLUCOSE 185*   < > 291*   < > 252* 162*  BUN 28*   < > 28*   < > 28* 28*  CREATININE 2.76*   < > 2.61*   < > 2.49* 2.30*  CALCIUM 8.6*   < > 8.2*   < > 8.5* 8.6*  MG  --    < > 1.8  --   --  1.9  PHOS 4.4  --   --   --  3.5  --    < > = values in this interval not displayed.   Liver Function Tests Recent Labs    03/06/23 1409 03/08/23 1400  ALBUMIN 3.1* 3.1*   No results for input(s): "LIPASE", "AMYLASE" in the last 72 hours. Cardiac Enzymes No results for input(s): "CKTOTAL", "CKMB", "CKMBINDEX", "TROPONINI" in the last 72 hours.  BNP: BNP (last 3 results) Recent Labs    03/04/23 1300  BNP 1,563.8*   Other results:   Imaging    No results found.   Medications:  Scheduled Medications:  amiodarone  200 mg Oral Daily   apixaban  5 mg Oral BID   atorvastatin  40 mg Oral Daily   Chlorhexidine Gluconate Cloth  6 each Topical Daily   dapagliflozin propanediol  10 mg Oral Daily   insulin aspart  0-9 Units Subcutaneous TID WC   isosorbide-hydrALAZINE  2 tablet Oral TID   sodium chloride flush  3 mL Intravenous Q12H   torsemide  60 mg Oral Daily    Infusions:  sodium chloride 10 mL/hr at 03/05/23 1836    PRN Medications: sodium chloride, acetaminophen **OR** acetaminophen, polyethylene glycol, sodium chloride flush  Patient Profile   51/M w severe biventricular systolic heart failure EF less than 20% with severely reduced RV on TEE 6/23, NICM, CKD 3B, poorly controlled hypertension, prediabetes, CVA, atrial fibrillation, OSA, obesity, CHF presented with worsening shortness of breath.   Admitted with A/C HFrEF.   Assessment/Plan   1. Acute on Chronic Biventricular Systolic Heart Failure - NICM, likely 2/2 poorly  controlled HTN + tachymediated from rapid Afib . - Echo (2014): EF 25-30%, LHC no CAD  - Echo (4/17): EF 20-25%, RV mild dilated with moderately reduced systolic function, severe LAE - SPEP negative, no M spike. UPEP negative in 2017  - Echo (10/17): EF normalized, 50-55% - Echo (12/20): EF 50-55% - Echo (6/23): EF 25-30%, global HK, RV normal. In setting of new Afib w/  RVR, uncontrolled hypertension and poor compliance w/ meds  - Not current candidate for LHC given CKD w/ b/l SCr >2 - NYHA IV, marked volume overload. Given 120 mg IV Lasix and started on 80mg  IV Lasix BID in ED.  - Echo EF 25% RV mildly reduced.  -Started on torsemide 60 mg daily.  CVP 3-5 today.  Serum creatinine continuing to improve.  Remains hypertensive on BiDil 2 tabs 3 times daily. -Continue Farxiga 10 mg daily. -Plan to start Coreg 3.125 mg twice daily tomorrow depending on mixed venous. -Can start ARB/Arni if serum creatinine continues to improve tomorrow. -Plan for discharge tomorrow afternoon.  2. H/o PAF  - Diagnosed 6/23 - Sleep study with moderate OSA 8/24 - s/p multiple DCCV to NSR w/ ERAF (last 11/30/21) - Maintaining SR.  - Continue amiodarone 200 mg daily.  - Continue Eliquis 5 mg BID.  - Followed in EP Clinic. Needs to be considered for Afib ablation. LA diameter 4.40 cm   3. Hypertension  - Poorly controlled.  - Continue Bidil 2 tab TID.    4. CKD Stage IV - Suspect hypertensive/ DM nephropathy  - Prev followed by Nephrology but lost to f/u will need to re-establish.  - SCr >2 on admission, unclear baseline - Continue Farxiga 10 mg daily - He has been referred to [facility].  - On admit Scr 2.67 (last 4/24 1.97)-->2.78-->2.87>2.68>2.61>2.3.    - Denies taking recent NSAIDs   5. Type 2 DM  - Recent Hgb A1c 6.4.  - Continue farxiga 10 mg daily.    6. H/o CVA  - Cryptogenetic (2020). Refused loop recorder. - PTA he was out of eliquis. Restarted eliquis on admit.      7. OSA - Sleep study +  for OSA on 8/24. Pt states he was not aware of results. - CPAP recommended  8. Hypokelamia - 2/2 diuresis - K 3, replete - repeat BMET later today   He is uninsured and will need patient assistance for medications.   Length of Stay: 5  Yaslin Kirtley, DO  03/09/2023,  11:39 AM  Advanced Heart Failure Team Pager [phone] (M-F; 7a - 5p)  Please contact [facility] for night-coverage after hours (5p -7a ) and weekends on [email or web address]

## 2023-03-10 LAB — BASIC METABOLIC PANEL
Anion gap: 9 (ref 5–15)
BUN: 29 mg/dL — ABNORMAL HIGH (ref 6–20)
CO2: 25 mmol/L (ref 22–32)
Calcium: 8.7 mg/dL — ABNORMAL LOW (ref 8.9–10.3)
Chloride: 103 mmol/L (ref 98–111)
Creatinine, Ser: 2.24 mg/dL — ABNORMAL HIGH (ref 0.61–1.24)
GFR, Estimated: 34 mL/min — ABNORMAL LOW (ref >60)
Glucose, Bld: 218 mg/dL — ABNORMAL HIGH (ref 70–99)
Potassium: 3.7 mmol/L (ref 3.5–5.1)
Sodium: 137 mmol/L (ref 135–145)

## 2023-03-10 LAB — GLUCOSE, CAPILLARY
Glucose-Capillary: 163 mg/dL — ABNORMAL HIGH (ref 70–99)
Glucose-Capillary: 217 mg/dL — ABNORMAL HIGH (ref 70–99)
Glucose-Capillary: 249 mg/dL — ABNORMAL HIGH (ref 70–99)
Glucose-Capillary: 194 mg/dL — ABNORMAL HIGH (ref 70–99)

## 2023-03-10 LAB — COOXEMETRY PANEL
Carboxyhemoglobin: 4.4 % — ABNORMAL HIGH (ref 0.5–1.5)
Methemoglobin: 1 % (ref 0.0–1.5)
O2 Saturation: 79.8 %
Total hemoglobin: 11.8 g/dL — ABNORMAL LOW (ref 12.0–16.0)

## 2023-03-10 LAB — MAGNESIUM: Magnesium: 2.1 mg/dL (ref 1.7–2.4)

## 2023-03-10 MED ORDER — SACUBITRIL-VALSARTAN 24-26 MG PO TABS
1.0000 | ORAL_TABLET | Freq: Two times a day (BID) | ORAL | Status: DC
  Administered 2023-03-10 – 2023-03-12 (×5): 1 via ORAL
  Filled 2023-03-10 (×5): qty 1

## 2023-03-10 MED ORDER — SODIUM CHLORIDE 0.9 % IV SOLN: INTRAVENOUS | Status: DC

## 2023-03-10 MED ORDER — POTASSIUM CHLORIDE CRYS ER 20 MEQ PO TBCR
40.0000 meq | EXTENDED_RELEASE_TABLET | Freq: Once | ORAL | Status: AC
  Administered 2023-03-10: 40 meq via ORAL
  Filled 2023-03-10: qty 2

## 2023-03-10 NOTE — Progress Notes (Signed)
   03/10/23 1515  Vitals  Temp 98 F (36.7 C)  Temp Source Oral  BP 136/82  MAP (mmHg) 98  BP Location Left Arm  BP Method Automatic  Patient Position (if appropriate) Lying  Pulse Rate 99  Pulse Rate Source Monitor  ECG Heart Rate 98  Resp 17  Level of Consciousness  Level of Consciousness Alert  MEWS COLOR  MEWS Score Color Green  Oxygen Therapy  SpO2 98 %  O2 Device Room Air  MEWS Score  MEWS Temp 0  MEWS Systolic 0  MEWS Pulse 0  MEWS RR 0  MEWS LOC 0  MEWS Score 0   Repeated VS. Patient watching a game on the phone. Joking with the nurse at bedside.

## 2023-03-10 NOTE — Progress Notes (Addendum)
Patient ID: Richard [name], male   DOB: 1971/01/21, 52 y.o.   MRN: [id number]     Advanced Heart Failure Rounding Note  Subjective:    Initially diuresed on milrinone, now off.  Co-ox 80% today.  I/Os net negative 2444 on po torsemide 60 mg daily.  CVP 12 on my read.  No weight today.  Rhythm difficult on telemetry, ?back in atrial fibrillation.  Creatinine mildly lower at 2.24.   Objective:   Weight Range: (!) 145.1 kg Body mass index is 44.62 kg/m.   Vital Signs:   Temp:  [97.3 F (36.3 C)-98.8 F (37.1 C)] 97.3 F (36.3 C) (10/06 0728) Pulse Rate:  [92-115] 92 (10/06 0728) Resp:  [17-20] 17 (10/06 0728) BP: (129-149)/(82-117) 146/117 (10/06 0728) SpO2:  [95 %-100 %] 95 % (10/06 0728) Last BM Date : 03/09/23  Weight change: Filed Weights   03/07/23 0449 03/08/23 0106 03/09/23 0500  Weight: (!) 151.8 kg (!) 144.5 kg (!) 145.1 kg    Intake/Output:   Intake/Output Summary (Last 24 hours) at 03/10/2023 0945 Last data filed at 03/10/2023 0900 Gross per 24 hour  Intake 1426 ml  Output 3880 ml  Net -2454 ml    CVP 12  Physical Exam    General: NAD Neck: JVP 8-9 cm, no thyromegaly or thyroid nodule.  Lungs: Clear to auscultation bilaterally with normal respiratory effort. CV: Nondisplaced PMI.  Heart tachy, irregular S1/S2, no S3/S4, no murmur.  1+ ankle edema.  Abdomen: Soft, nontender, no hepatosplenomegaly, no distention.  Skin: Intact without lesions or rashes.  Neurologic: Alert and oriented x 3.  Psych: Normal affect. Extremities: No clubbing or cyanosis.  HEENT: Normal.    Telemetry  NSR 80s to 100s  EKG   N/A   Labs    CBC No results for input(s): "WBC", "NEUTROABS", "HGB", "HCT", "MCV", "PLT" in the last 72 hours.  Basic Metabolic Panel Recent Labs    95/62/13 1400 03/09/23 0508 03/10/23 0349  NA 134* 135 137  K 3.4* 3.7 3.7  CL 93* 97* 103  CO2 29 27 25   GLUCOSE 252* 162* 218*  BUN 28* 28* 29*  CREATININE 2.49* 2.30* 2.24*  CALCIUM 8.5*  8.6* 8.7*  MG  --  1.9 2.1  PHOS 3.5  --   --    Liver Function Tests Recent Labs    03/08/23 1400  ALBUMIN 3.1*   No results for input(s): "LIPASE", "AMYLASE" in the last 72 hours. Cardiac Enzymes No results for input(s): "CKTOTAL", "CKMB", "CKMBINDEX", "TROPONINI" in the last 72 hours.  BNP: BNP (last 3 results) Recent Labs    03/04/23 1300  BNP 1,563.8*   Other results:   Imaging    No results found.   Medications:     Scheduled Medications:  amiodarone  200 mg Oral Daily   apixaban  5 mg Oral BID   atorvastatin  40 mg Oral Daily   Chlorhexidine Gluconate Cloth  6 each Topical Daily   dapagliflozin propanediol  10 mg Oral Daily   insulin aspart  0-9 Units Subcutaneous TID WC   isosorbide-hydrALAZINE  2 tablet Oral TID   sacubitril-valsartan  1 tablet Oral BID   sodium chloride flush  3 mL Intravenous Q12H   torsemide  60 mg Oral Daily    Infusions:  sodium chloride 10 mL/hr at 03/09/23 1836    PRN Medications: sodium chloride, acetaminophen **OR** acetaminophen, polyethylene glycol, sodium chloride flush  Patient Profile   51/M w severe biventricular systolic heart failure  EF less than 20% with severely reduced RV on TEE 6/23, NICM, CKD 3B, poorly controlled hypertension, prediabetes, CVA, atrial fibrillation, OSA, obesity, CHF presented with worsening shortness of breath.   Admitted with A/C HFrEF.   Assessment/Plan   1. Acute on chronic systolic CHF:  NICM, likely 2/2 poorly controlled HTN + tachy-mediated from rapid Afib (though no AF documented since 8/23).  Diagnosis in 2014 with EF 25-30%, LHC at that time with no CAD.  By 10/17, EF up to 50-55%.  In 6/23, EF by echo down to 25-30% in setting of AF/RVR and uncontrolled HTN off meds.  Had DCCV in 6/23 x 2 episodes and back in NSR on amiodarone. Poor compliance with outpatient followup, readmitted 10/24 with CHF exacerbation and echo with EF 25%, moderate LV dilation, mild RV dysfunction.   Hypertensive, says he has been taking meds at home.  Low output HF by co-ox, started on milrinone and later titrated off.  Today, co-ox 80% off milrinone with CVP 12.  - Will arrange for formal RHC tomorrow off milrinone to reassess filling pressures.  Discussed risks/benefits with patient, agrees to procedure.  - Hold off on coronary angiography with elevated creatinine.  - I will order cardiac MRI to assess for infiltrative disease, etc.  - Continue torsemide 60 mg daily, still with some volume overload but looks like he diuresed well yesterday.  Reassess volume by RHC tomorrow.  - Continue dapagliflozin 10 mg daily.  - Continue Bidil 2 tabs tid.  - Add Entresto 24/26 bid.  2. Atrial fibrillation: Paroxysmal. Diagnosed 6/23, had DCCV on 2 occasions in 6/23 then held NSR on amiodarone.  Sleep study with moderate OSA in 8/24.  Rhythm difficult this morning, ?back in atrial fibrillation.  - Continue amiodarone 200 mg daily.   - Will get ECG, amiodarone to IV if AF => ECG shows NSR with PVCs.  - Will need to start CPAP as outpatient.   - Continue apixaban.  - Reconsider AF ablation with EP followup.  3. Hypertension: Poorly controlled.  - Adding Entresto today.  4. CKD Stage IV: Suspect hypertensive/ DM nephropathy. Baseline creatinine seems to be around 2.  Creatinine trending down, 2.24 today.  - Previously followed by Nephrology but lost to f/u will need to re-establish.  - Continue Farxiga 10 mg daily 5. Type 2 DM: Recent Hgb A1c 6.4.  - Continue farxiga 10 mg daily.  6. H/o CVA: Cryptogenetic (2020). Refused loop recorder.  Now on Eliquis with AF.  7. OSA: Sleep study + for OSA on 8/24. Pt states he was not aware of results. - CPAP recommended   He is uninsured and will need patient assistance for medications.   Length of Stay: 6  Marca Ancona, MD  03/10/2023, 9:45 AM  Advanced Heart Failure Team Pager [phone] (M-F; 7a - 5p)  Please contact [facility] for night-coverage  after hours (5p -7a ) and weekends on [email or web address]

## 2023-03-10 NOTE — Evaluation (Signed)
Physical Therapy Evaluation Patient Details Name: Richard [name] MRN: [id number] DOB: 20-Aug-1970 Today's Date: 03/10/2023  History of Present Illness  Pt is a 52 y/o [name] presenting to ED on 9/30 with worsening SOB; admitted for working diagnosis of heart failure decompensation. PMH cindlues CKD IIIB, HTN, prediabetes, CVA, A fib, OSA, Obesity III  Clinical Impression  Pt admitted with above diagnosis. Pt limited as he was in a lot of pain due to bil Unna boots per pt. Messaged MD and nurse and MD to take care of removal of Unna boots. Pt has mobility difficulties due to pain.  Will continue to progress pt as able.  Pt currently with functional limitations due to the deficits listed below (see PT Problem List). Pt will benefit from acute skilled PT to increase their independence and safety with mobility to allow discharge.           If plan is discharge home, recommend the following: A little help with walking and/or transfers;A little help with bathing/dressing/bathroom;Assistance with cooking/housework;Assist for transportation;Help with stairs or ramp for entrance   Can travel by private vehicle        Equipment Recommendations Other (comment) (TBA probably a bariatric rollator)  Recommendations for Other Services       Functional Status Assessment Patient has had a recent decline in their functional status and demonstrates the ability to make significant improvements in function in a reasonable and predictable amount of time.     Precautions / Restrictions Precautions Precaution Comments: watch HR Restrictions Weight Bearing Restrictions: No      Mobility  Bed Mobility               General bed mobility comments: OOB upon arrival and departure    Transfers Overall transfer level: Needs assistance Equipment used:  (IV pole and external support) Transfers: Sit to/from Stand Sit to Stand: Min assist           General transfer comment: min assist from recliner with  pt widening BOS. Pt stood but could not sustain standing due to bil LE pain. Asking to remove Unna boots. PT messaged MD and nurse as pt declined to walk due to pain bil LEs.    Ambulation/Gait                  Stairs            Wheelchair Mobility     Tilt Bed    Modified Rankin (Stroke Patients Only)       Balance Overall balance assessment: Needs assistance Sitting-balance support: Feet supported Sitting balance-Leahy Scale: Good     Standing balance support: During functional activity, Bilateral upper extremity supported Standing balance-Leahy Scale: Poor Standing balance comment: reaching out for external support                             Pertinent Vitals/Pain Pain Assessment Pain Assessment: Faces Faces Pain Scale: Hurts whole lot Pain Location: knees and ankles Pain Descriptors / Indicators: Discomfort Pain Intervention(s): Limited activity within patient's tolerance, Monitored during session, Repositioned    Home Living Family/patient expects to be discharged to:: Private residence Living Arrangements: Alone Available Help at Discharge: Family;Friend(s);Available PRN/intermittently Type of Home: House Home Access: Level entry     Alternate Level Stairs-Number of Steps: flight Home Layout: Two level;Bed/bath upstairs Home Equipment: None Additional Comments: [employer]    Prior Function Prior Level of Function : Independent/Modified Independent;Driving;Working/employed  Mobility Comments: no AD ADLs Comments: ind, works as a Regulatory affairs officer Extremity Assessment Upper Extremity Assessment: Defer to OT evaluation    Lower Extremity Assessment Lower Extremity Assessment: Generalized weakness;RLE deficits/detail;LLE deficits/detail RLE Deficits / Details: limited by Unna boots LLE Deficits / Details: limited by Unna boots    Cervical / Trunk  Assessment Cervical / Trunk Assessment: Normal  Communication   Communication Communication: No apparent difficulties  Cognition Arousal: Alert Behavior During Therapy: WFL for tasks assessed/performed Overall Cognitive Status: Within Functional Limits for tasks assessed                                          General Comments General comments (skin integrity, edema, etc.): VSS    Exercises General Exercises - Lower Extremity Ankle Circles/Pumps: AROM, Both, 10 reps, Supine Heel Slides: AROM, Both, 10 reps, Supine   Assessment/Plan    PT Assessment Patient needs continued PT services  PT Problem List Decreased activity tolerance;Decreased balance;Decreased mobility;Decreased knowledge of use of DME;Decreased safety awareness;Cardiopulmonary status limiting activity;Obesity;Pain       PT Treatment Interventions DME instruction;Gait training;Functional mobility training;Therapeutic activities;Therapeutic exercise;Balance training;Patient/family education    PT Goals (Current goals can be found in the Care Plan section)  Acute Rehab PT Goals Patient Stated Goal: to go home PT Goal Formulation: With patient Time For Goal Achievement: 03/24/23 Potential to Achieve Goals: Fair    Frequency Min 1X/week     Co-evaluation               AM-PAC PT "6 Clicks" Mobility  Outcome Measure Help needed turning from your back to your side while in a flat bed without using bedrails?: A Little Help needed moving from lying on your back to sitting on the side of a flat bed without using bedrails?: A Little Help needed moving to and from a bed to a chair (including a wheelchair)?: A Little Help needed standing up from a chair using your arms (e.g., wheelchair or bedside chair)?: A Little Help needed to walk in hospital room?: A Little Help needed climbing 3-5 steps with a railing? : A Lot 6 Click Score: 17    End of Session Equipment Utilized During Treatment:  Gait belt Activity Tolerance: Patient limited by fatigue;Patient limited by pain Patient left: in chair;with call bell/phone within reach Nurse Communication: Mobility status PT Visit Diagnosis: Unsteadiness on feet (R26.81);Muscle weakness (generalized) (M62.81)    Time: 2376-2831 PT Time Calculation (min) (ACUTE ONLY): 17 min   Charges:   PT Evaluation $PT Eval Low Complexity: 1 Low   PT General Charges $$ ACUTE PT VISIT: 1 Visit         Richard [name],PT Acute Rehab Services [phone]   Bevelyn Buckles 03/10/2023, 12:05 PM

## 2023-03-10 NOTE — Evaluation (Signed)
Occupational Therapy Evaluation Patient Details Name: Richard [name] MRN: [id number] DOB: 05/17/1971 Today's Date: 03/10/2023   History of Present Illness Pt is a 52 y/o M presenting to ED on 9/30 with worsening SOB; admitted for working diagnosis of heart failure decompensation. PMH cindlues CKD IIIB, HTN, prediabetes, CVA, A fib, OSA, Obesity III   Clinical Impression   Pt reports ind at baseline with ADLs and functional mobility, lives alone and works as a Designer, industrial/product. Pt currently needing set up -min A for ADLs, and CGA for transfers. Pt with incr reliance on IV pole and reaching out for bed rail and support from other items in room due to knee and ankle pain.  Pt HR 110-120bpm with grooming task at sink, 2/4 DOE noted upon return to chair, SpO2 in 90's on RA. Began education on energy conservation strategies for home. Pt presenting with impairments listed below, will follow acutely. Anticipate no OT follow up needs at d/c.        If plan is discharge home, recommend the following: A little help with walking and/or transfers;A little help with bathing/dressing/bathroom;Assistance with cooking/housework;Assist for transportation    Functional Status Assessment  Patient has had a recent decline in their functional status and demonstrates the ability to make significant improvements in function in a reasonable and predictable amount of time.  Equipment Recommendations  Tub/shower bench    Recommendations for Other Services PT consult     Precautions / Restrictions Precautions Precaution Comments: watch HR Restrictions Weight Bearing Restrictions: No      Mobility Bed Mobility               General bed mobility comments: OOB upon arrival and departure    Transfers Overall transfer level: Needs assistance Equipment used:  (IV pole and external support) Transfers: Sit to/from Stand Sit to Stand: Contact guard assist           General transfer comment: pushing  IV pole and reaching out for support on bed rail and computer in room      Balance Overall balance assessment: Needs assistance Sitting-balance support: Feet supported Sitting balance-Leahy Scale: Good     Standing balance support: During functional activity, Reliant on assistive device for balance Standing balance-Leahy Scale: Fair Standing balance comment: reaching out for external support                           ADL either performed or assessed with clinical judgement   ADL Overall ADL's : Needs assistance/impaired Eating/Feeding: Set up   Grooming: Set up;Standing;Oral care;Wash/dry face   Upper Body Bathing: Minimal assistance   Lower Body Bathing: Minimal assistance   Upper Body Dressing : Minimal assistance   Lower Body Dressing: Minimal assistance   Toilet Transfer: Contact guard assist;Ambulation;Regular Toilet   Toileting- Clothing Manipulation and Hygiene: Minimal assistance       Functional mobility during ADLs: Contact guard assist       Vision   Vision Assessment?: No apparent visual deficits     Perception Perception: Not tested       Praxis Praxis: Not tested       Pertinent Vitals/Pain Pain Assessment Pain Assessment: Faces Pain Score: 4  Pain Location: knees and ankles Pain Descriptors / Indicators: Discomfort Pain Intervention(s): Limited activity within patient's tolerance, Monitored during session, Repositioned     Extremity/Trunk Assessment Upper Extremity Assessment Upper Extremity Assessment: Overall WFL for tasks assessed   Lower Extremity Assessment Lower  Extremity Assessment: Defer to PT evaluation   Cervical / Trunk Assessment Cervical / Trunk Assessment: Normal   Communication Communication Communication: No apparent difficulties   Cognition Arousal: Alert Behavior During Therapy: WFL for tasks assessed/performed Overall Cognitive Status: Within Functional Limits for tasks assessed                                        General Comments  HR 110-120 with activity, 2/4 DOE noted, SpO2 90s on RA    Exercises     Shoulder Instructions      Home Living Family/patient expects to be discharged to:: Private residence Living Arrangements: Alone Available Help at Discharge: Family;Friend(s);Available PRN/intermittently Type of Home: House Home Access: Level entry     Home Layout: Two level;Bed/bath upstairs Alternate Level Stairs-Number of Steps: flight   Bathroom Shower/Tub: Tub/shower unit         Home Equipment: None          Prior Functioning/Environment Prior Level of Function : Independent/Modified Independent;Driving;Working/employed             Mobility Comments: no AD ADLs Comments: ind, works as a Loss adjuster, chartered Problem List: Decreased strength;Decreased range of motion;Decreased activity tolerance;Impaired balance (sitting and/or standing);Decreased safety awareness;Decreased knowledge of use of DME or AE;Cardiopulmonary status limiting activity      OT Treatment/Interventions: Self-care/ADL training;Therapeutic exercise;DME and/or AE instruction;Energy conservation;Therapeutic activities;Patient/family education;Balance training    OT Goals(Current goals can be found in the care plan section) Acute Rehab OT Goals Patient Stated Goal: to get unna boots off OT Goal Formulation: With patient Time For Goal Achievement: 03/24/23 Potential to Achieve Goals: Good ADL Goals Pt Will Perform Lower Body Dressing: Independently;sitting/lateral leans;sit to/from stand Pt Will Perform Tub/Shower Transfer: Tub transfer;Shower transfer;Independently;ambulating Additional ADL Goal #1: pt will verbalize x3 energy conservation strategies in prep for ADLs  OT Frequency: Min 1X/week    Co-evaluation              AM-PAC OT "6 Clicks" Daily Activity     Outcome Measure Help from another person eating meals?: None Help from another  person taking care of personal grooming?: A Little Help from another person toileting, which includes using toliet, bedpan, or urinal?: A Little Help from another person bathing (including washing, rinsing, drying)?: A Little Help from another person to put on and taking off regular upper body clothing?: A Little Help from another person to put on and taking off regular lower body clothing?: A Little 6 Click Score: 19   End of Session Nurse Communication: Mobility status  Activity Tolerance: Patient tolerated treatment well Patient left: in chair;with call bell/phone within reach  OT Visit Diagnosis: Unsteadiness on feet (R26.81);Other abnormalities of gait and mobility (R26.89);Muscle weakness (generalized) (M62.81)                Time: 1610-9604 OT Time Calculation (min): 18 min Charges:  OT General Charges $OT Visit: 1 Visit OT Evaluation $OT Eval Low Complexity: 1 Low  Maejor Erven K, OTD, OTR/L SecureChat Preferred Acute Rehab [phone]   Samad Thon K Koonce 03/10/2023, 9:09 AM

## 2023-03-10 NOTE — Progress Notes (Signed)
   03/10/23 1439  Vitals  Temp 98.2 F (36.8 C)  Temp Source Oral  BP 119/63  MAP (mmHg) 80  BP Location Left Arm  BP Method Automatic  Patient Position (if appropriate) Sitting  Pulse Rate 64  Pulse Rate Source Monitor  ECG Heart Rate (!) 105  Resp (!) 29  Level of Consciousness  Level of Consciousness Alert  MEWS COLOR  MEWS Score Color Yellow  Oxygen Therapy  SpO2 98 %  O2 Device Room Air  MEWS Score  MEWS Temp 0  MEWS Systolic 0  MEWS Pulse 1  MEWS RR 2  MEWS LOC 0  MEWS Score 3   Alarm ringing at nursing station Vtach- frequent PVCs HR ranging from 90s to 110s. Primary nurse at bedside to assess patient. VS obtained. Patient asleep in chair. Appears more fatigue compared to this morning. Respirations labored at rest. CVP 7

## 2023-03-10 NOTE — Progress Notes (Signed)
Progress Note   Patient: Richard [name] KGU:[id number] DOB: Mar 24, 1971 DOA: 03/04/2023     6 DOS: the patient was seen and examined on 03/10/2023   Brief hospital course: Richard [name] was admitted to the hospital with the working diagnosis of heart failure decompensation.   51/M with past medical history of heart failure, CKD 3B hypertension, prediabetes, CVA, atrial fibrillation, OSA, and obesity class 3 who presented with worsening shortness of breath. He reported 3 days of progressive shortness of breath, lower extremity edema, and abdominal distention. On his initial physical examination his blood pressure was 161/120, 179/130, HR 86, RR 36 and 02 saturation 97% on supplemental 02 per [place], lungs with decreased breath sounds, positive rales bilaterally, heart with S1 and S2 present and regular with no gallops or rubs, no murmurs, abdomen with no distention, positive lower extremity edema +++.   Na 138, K 3,2 Cl 106 bicarbonate 16 glucose 186, bun 25 cr 2,67 BNP 1,563  High sensitive troponin 48 and 69  Wbc 6.5 hgb 11,7 plt 182  Sars covid 19 negative.   Chest radiograph with cardiomegaly, with bilateral hilar vascular congestion and cephalization of the vasculature.   EKG 93 bpm, normal axis, qtc 432, normal intervals, sinus rhythm with sinus arrhythmia, and PVC, poor R R wave progression, with no significant ST segment or T wave changes.   Placed on furosemide for diuresis. Noted in low output heart failure.   10/02 picc line placed and patient started on Milrinone for low output heart failure and furosemide drip. 10/05 off milrinone and transitioned to oral loop diuretic therapy.  10/06 plan for right heart cath tomorrow. Patient requested Unna boots to be removed.   Assessment and Plan: Acute on chronic systolic (congestive) heart failure (HCC) Echocardiogram with reduced LV systolic function 25%, global hypokinesis, LV cavity with moderate dilatation, RV systolic function with mild  reduction, RVSP 45.9 mmHg. LA and RA with mild dilatation.   Low output heart failure.   Urine output 3,630 ml   Systolic blood pressure 119 to 136  SV02 58.8    Off milrinone Discontinued acetazolamide, he also required metolazone.  Continue with dapagliflozin, and oral torsemide.  After load reduction with Bidil and today added Entresto.  Plan for right heart catheterization tomorrow.   Essential hypertension, benign Continue blood pressure control with bidil.    Paroxysmal atrial fibrillation (HCC) Continue rate control with amiodarone, anticoagulation with apixaban.   Stage 3b chronic kidney disease (HCC) Hypokalemia. Hyponatremia.   Renal function with serum cr at 2,2 with K at 3,7 and serum bicarbonate at 25.  Na 137 Mg 2.1   Patient had metolazone and acetazolamide for sequential nephron blockade.  Continue with SGLT 2 inh and torsemide,   K correction with Kcl 40 meq Follow up renal function and electrolytes.   Type 2 diabetes mellitus with hyperlipidemia (HCC) Continue glucose cover and monitoring with insulin sliding scale. Continue statin therapy.   History of CVA (cerebrovascular accident) Continue blood pressure control and anticoagulation.   Obesity, diabetes, and hypertension syndrome (HCC) Calculated BMI 48,6         Subjective: Patient is having knee pain related to unna boots and has requested them to be removed, his dyspnea and edema are improving,   Physical Exam: Vitals:   03/10/23 1000 03/10/23 1137 03/10/23 1439 03/10/23 1515  BP:  (!) 158/110 119/63 136/82  Pulse:  100 64   Resp:  17 (!) 29   Temp:  (!) 97.4 F (36.3 C)  98.2 F (36.8 C)   TempSrc:  Oral Oral Oral  SpO2:  95% 98%   Weight: (!) 142.4 kg     Height:       Neurology awake and alert ENT with mild pallor Cardiovascular with S1 and S2 present and regular with no gallops, rubs or murmurs No JVD Trace lower extremity edema, warm lower extremties Respiratory with no  rales or wheezing, no rhonchi Abdomen with no distention  Data Reviewed:    Family Communication: no family at the bedside   Disposition: Status is: Inpatient Remains inpatient appropriate because: right heart cath tomorrow   Planned Discharge Destination: Home  Author: Coralie Keens, MD 03/10/2023 3:32 PM  For on call review [email or web address].

## 2023-03-10 NOTE — H&P (View-Only) (Signed)
Patient ID: Richard [name], male   DOB: 1971/01/21, 52 y.o.   MRN: [id number]     Advanced Heart Failure Rounding Note  Subjective:    Initially diuresed on milrinone, now off.  Co-ox 80% today.  I/Os net negative 2444 on po torsemide 60 mg daily.  CVP 12 on my read.  No weight today.  Rhythm difficult on telemetry, ?back in atrial fibrillation.  Creatinine mildly lower at 2.24.   Objective:   Weight Range: (!) 145.1 kg Body mass index is 44.62 kg/m.   Vital Signs:   Temp:  [97.3 F (36.3 C)-98.8 F (37.1 C)] 97.3 F (36.3 C) (10/06 0728) Pulse Rate:  [92-115] 92 (10/06 0728) Resp:  [17-20] 17 (10/06 0728) BP: (129-149)/(82-117) 146/117 (10/06 0728) SpO2:  [95 %-100 %] 95 % (10/06 0728) Last BM Date : 03/09/23  Weight change: Filed Weights   03/07/23 0449 03/08/23 0106 03/09/23 0500  Weight: (!) 151.8 kg (!) 144.5 kg (!) 145.1 kg    Intake/Output:   Intake/Output Summary (Last 24 hours) at 03/10/2023 0945 Last data filed at 03/10/2023 0900 Gross per 24 hour  Intake 1426 ml  Output 3880 ml  Net -2454 ml    CVP 12  Physical Exam    General: NAD Neck: JVP 8-9 cm, no thyromegaly or thyroid nodule.  Lungs: Clear to auscultation bilaterally with normal respiratory effort. CV: Nondisplaced PMI.  Heart tachy, irregular S1/S2, no S3/S4, no murmur.  1+ ankle edema.  Abdomen: Soft, nontender, no hepatosplenomegaly, no distention.  Skin: Intact without lesions or rashes.  Neurologic: Alert and oriented x 3.  Psych: Normal affect. Extremities: No clubbing or cyanosis.  HEENT: Normal.    Telemetry  NSR 80s to 100s  EKG   N/A   Labs    CBC No results for input(s): "WBC", "NEUTROABS", "HGB", "HCT", "MCV", "PLT" in the last 72 hours.  Basic Metabolic Panel Recent Labs    95/62/13 1400 03/09/23 0508 03/10/23 0349  NA 134* 135 137  K 3.4* 3.7 3.7  CL 93* 97* 103  CO2 29 27 25   GLUCOSE 252* 162* 218*  BUN 28* 28* 29*  CREATININE 2.49* 2.30* 2.24*  CALCIUM 8.5*  8.6* 8.7*  MG  --  1.9 2.1  PHOS 3.5  --   --    Liver Function Tests Recent Labs    03/08/23 1400  ALBUMIN 3.1*   No results for input(s): "LIPASE", "AMYLASE" in the last 72 hours. Cardiac Enzymes No results for input(s): "CKTOTAL", "CKMB", "CKMBINDEX", "TROPONINI" in the last 72 hours.  BNP: BNP (last 3 results) Recent Labs    03/04/23 1300  BNP 1,563.8*   Other results:   Imaging    No results found.   Medications:     Scheduled Medications:  amiodarone  200 mg Oral Daily   apixaban  5 mg Oral BID   atorvastatin  40 mg Oral Daily   Chlorhexidine Gluconate Cloth  6 each Topical Daily   dapagliflozin propanediol  10 mg Oral Daily   insulin aspart  0-9 Units Subcutaneous TID WC   isosorbide-hydrALAZINE  2 tablet Oral TID   sacubitril-valsartan  1 tablet Oral BID   sodium chloride flush  3 mL Intravenous Q12H   torsemide  60 mg Oral Daily    Infusions:  sodium chloride 10 mL/hr at 03/09/23 1836    PRN Medications: sodium chloride, acetaminophen **OR** acetaminophen, polyethylene glycol, sodium chloride flush  Patient Profile   51/M w severe biventricular systolic heart failure  EF less than 20% with severely reduced RV on TEE 6/23, NICM, CKD 3B, poorly controlled hypertension, prediabetes, CVA, atrial fibrillation, OSA, obesity, CHF presented with worsening shortness of breath.   Admitted with A/C HFrEF.   Assessment/Plan   1. Acute on chronic systolic CHF:  NICM, likely 2/2 poorly controlled HTN + tachy-mediated from rapid Afib (though no AF documented since 8/23).  Diagnosis in 2014 with EF 25-30%, LHC at that time with no CAD.  By 10/17, EF up to 50-55%.  In 6/23, EF by echo down to 25-30% in setting of AF/RVR and uncontrolled HTN off meds.  Had DCCV in 6/23 x 2 episodes and back in NSR on amiodarone. Poor compliance with outpatient followup, readmitted 10/24 with CHF exacerbation and echo with EF 25%, moderate LV dilation, mild RV dysfunction.   Hypertensive, says he has been taking meds at home.  Low output HF by co-ox, started on milrinone and later titrated off.  Today, co-ox 80% off milrinone with CVP 12.  - Will arrange for formal RHC tomorrow off milrinone to reassess filling pressures.  Discussed risks/benefits with patient, agrees to procedure.  - Hold off on coronary angiography with elevated creatinine.  - I will order cardiac MRI to assess for infiltrative disease, etc.  - Continue torsemide 60 mg daily, still with some volume overload but looks like he diuresed well yesterday.  Reassess volume by RHC tomorrow.  - Continue dapagliflozin 10 mg daily.  - Continue Bidil 2 tabs tid.  - Add Entresto 24/26 bid.  2. Atrial fibrillation: Paroxysmal. Diagnosed 6/23, had DCCV on 2 occasions in 6/23 then held NSR on amiodarone.  Sleep study with moderate OSA in 8/24.  Rhythm difficult this morning, ?back in atrial fibrillation.  - Continue amiodarone 200 mg daily.   - Will get ECG, amiodarone to IV if AF => ECG shows NSR with PVCs.  - Will need to start CPAP as outpatient.   - Continue apixaban.  - Reconsider AF ablation with EP followup.  3. Hypertension: Poorly controlled.  - Adding Entresto today.  4. CKD Stage IV: Suspect hypertensive/ DM nephropathy. Baseline creatinine seems to be around 2.  Creatinine trending down, 2.24 today.  - Previously followed by Nephrology but lost to f/u will need to re-establish.  - Continue Farxiga 10 mg daily 5. Type 2 DM: Recent Hgb A1c 6.4.  - Continue farxiga 10 mg daily.  6. H/o CVA: Cryptogenetic (2020). Refused loop recorder.  Now on Eliquis with AF.  7. OSA: Sleep study + for OSA on 8/24. Pt states he was not aware of results. - CPAP recommended   He is uninsured and will need patient assistance for medications.   Length of Stay: 6  Marca Ancona, MD  03/10/2023, 9:45 AM  Advanced Heart Failure Team Pager [phone] (M-F; 7a - 5p)  Please contact [facility] for night-coverage  after hours (5p -7a ) and weekends on [email or web address]

## 2023-03-10 NOTE — Progress Notes (Addendum)
8295: Patient c/o of knees being swollen due to unna boot. Education provided. Patient reported " I understand the purpose behind it, but it making my knee to swell". Knees do not appear swollen on assessment. Patient informed that order to change unnaboot is twice a week on Mondays and Thursdays. MD informed via secure chat of patient's compliant.   1200: Per Hospitalist, ok to remove unna boot. Unna boot removed at this time. No wounds or skin irritation noted after removal.

## 2023-03-10 NOTE — Progress Notes (Signed)
Patient at bedside expressed frustration. Asking primary nurse if he can contact the hospitalist. Patient states " I would've went home yesterday if it wasn't for the doctor that came in today". Plan for cardiac cath and Cardiac MRI tomorrow ( per radiology, no cardiac radiologist on today, will problem be informed for tomorrow- Heart failure provider made aware). Consent for cardiac cath signed and placed in chart. No weight obtained overnight, standing weight obtained at this time.

## 2023-03-11 ENCOUNTER — Inpatient Hospital Stay (HOSPITAL_COMMUNITY): Payer: Self-pay

## 2023-03-11 ENCOUNTER — Encounter (HOSPITAL_COMMUNITY)
Admission: EM | Disposition: A | Discharge status: Home Health | Payer: Self-pay | Source: Home / Self Care | Attending: Internal Medicine

## 2023-03-11 DIAGNOSIS — I428 Other cardiomyopathies: Secondary | ICD-10-CM

## 2023-03-11 HISTORY — PX: RIGHT HEART CATH: CATH118263

## 2023-03-11 LAB — POCT I-STAT EG7
Acid-Base Excess: 0 mmol/L (ref 0.0–2.0)
Bicarbonate: 25.5 mmol/L (ref 20.0–28.0)
Calcium, Ion: 1.17 mmol/L (ref 1.15–1.40)
HCT: 38 % — ABNORMAL LOW (ref 39.0–52.0)
Hemoglobin: 12.9 g/dL — ABNORMAL LOW (ref 13.0–17.0)
O2 Saturation: 63 %
Potassium: 3.6 mmol/L (ref 3.5–5.1)
Sodium: 141 mmol/L (ref 135–145)
TCO2: 27 mmol/L (ref 22–32)
pCO2, Ven: 42.5 mm[Hg] — ABNORMAL LOW (ref 44–60)
pH, Ven: 7.387 (ref 7.25–7.43)
pO2, Ven: 33 mm[Hg] (ref 32–45)

## 2023-03-11 LAB — BASIC METABOLIC PANEL
Anion gap: 11 (ref 5–15)
BUN: 30 mg/dL — ABNORMAL HIGH (ref 6–20)
CO2: 24 mmol/L (ref 22–32)
Calcium: 8.4 mg/dL — ABNORMAL LOW (ref 8.9–10.3)
Chloride: 99 mmol/L (ref 98–111)
Creatinine, Ser: 2.23 mg/dL — ABNORMAL HIGH (ref 0.61–1.24)
GFR, Estimated: 35 mL/min — ABNORMAL LOW (ref >60)
Glucose, Bld: 279 mg/dL — ABNORMAL HIGH (ref 70–99)
Potassium: 3.3 mmol/L — ABNORMAL LOW (ref 3.5–5.1)
Sodium: 134 mmol/L — ABNORMAL LOW (ref 135–145)

## 2023-03-11 LAB — MAGNESIUM: Magnesium: 1.7 mg/dL (ref 1.7–2.4)

## 2023-03-11 LAB — GLUCOSE, CAPILLARY
Glucose-Capillary: 156 mg/dL — ABNORMAL HIGH (ref 70–99)
Glucose-Capillary: 154 mg/dL — ABNORMAL HIGH (ref 70–99)
Glucose-Capillary: 174 mg/dL — ABNORMAL HIGH (ref 70–99)
Glucose-Capillary: 173 mg/dL — ABNORMAL HIGH (ref 70–99)

## 2023-03-11 LAB — COOXEMETRY PANEL
Carboxyhemoglobin: 3.7 % — ABNORMAL HIGH (ref 0.5–1.5)
Methemoglobin: <0.7 % (ref 0.0–1.5)
O2 Saturation: 64 %
Total hemoglobin: 13.9 g/dL (ref 12.0–16.0)

## 2023-03-11 SURGERY — RIGHT HEART CATH
Anesthesia: LOCAL

## 2023-03-11 MED ORDER — MAGNESIUM SULFATE 2 GM/50ML IV SOLN: 2.0000 g | Freq: Once | INTRAVENOUS | Status: DC

## 2023-03-11 MED ORDER — POTASSIUM CHLORIDE CRYS ER 20 MEQ PO TBCR
40.0000 meq | EXTENDED_RELEASE_TABLET | ORAL | Status: AC
  Administered 2023-03-11: 40 meq via ORAL
  Filled 2023-03-11: qty 2

## 2023-03-11 MED ORDER — SODIUM CHLORIDE 0.9 % IV SOLN: 250.0000 mL | INTRAVENOUS | Status: DC | PRN

## 2023-03-11 MED ORDER — HEPARIN (PORCINE) IN NACL 1000-0.9 UT/500ML-% IV SOLN
INTRAVENOUS | Status: DC | PRN
  Administered 2023-03-11: 500 mL

## 2023-03-11 MED ORDER — SODIUM CHLORIDE 0.9% FLUSH: 3.0000 mL | INTRAVENOUS | Status: DC | PRN

## 2023-03-11 MED ORDER — ONDANSETRON HCL 4 MG/2ML IJ SOLN: 4.0000 mg | Freq: Four times a day (QID) | INTRAMUSCULAR | Status: DC | PRN

## 2023-03-11 MED ORDER — LIDOCAINE HCL (PF) 1 % IJ SOLN
INTRAMUSCULAR | Status: DC | PRN
  Administered 2023-03-11: 2 mL via INTRADERMAL
  Administered 2023-03-11: 5 mL via INTRADERMAL

## 2023-03-11 MED ORDER — MIDAZOLAM HCL 2 MG/2ML IJ SOLN
INTRAMUSCULAR | Status: DC | PRN
  Administered 2023-03-11: 1 mg via INTRAVENOUS

## 2023-03-11 MED ORDER — LABETALOL HCL 5 MG/ML IV SOLN: 10.0000 mg | INTRAVENOUS | Status: AC | PRN

## 2023-03-11 MED ORDER — MIDAZOLAM HCL 2 MG/2ML IJ SOLN
INTRAMUSCULAR | Status: AC
  Filled 2023-03-11: qty 2

## 2023-03-11 MED ORDER — ACETAMINOPHEN 325 MG PO TABS: 650.0000 mg | ORAL_TABLET | ORAL | Status: DC | PRN

## 2023-03-11 MED ORDER — FENTANYL CITRATE (PF) 100 MCG/2ML IJ SOLN
INTRAMUSCULAR | Status: AC
  Filled 2023-03-11: qty 2

## 2023-03-11 MED ORDER — FENTANYL CITRATE (PF) 100 MCG/2ML IJ SOLN
INTRAMUSCULAR | Status: DC | PRN
  Administered 2023-03-11: 25 ug via INTRAVENOUS

## 2023-03-11 MED ORDER — SODIUM CHLORIDE 0.9% FLUSH
3.0000 mL | Freq: Two times a day (BID) | INTRAVENOUS | Status: DC
  Administered 2023-03-11: 3 mL via INTRAVENOUS

## 2023-03-11 MED ORDER — LIDOCAINE HCL (PF) 1 % IJ SOLN
INTRAMUSCULAR | Status: AC
  Filled 2023-03-11: qty 30

## 2023-03-11 MED ORDER — SPIRONOLACTONE 12.5 MG HALF TABLET
12.5000 mg | ORAL_TABLET | Freq: Every day | ORAL | Status: DC
  Administered 2023-03-11 – 2023-03-12 (×2): 12.5 mg via ORAL
  Filled 2023-03-11 (×2): qty 1

## 2023-03-11 MED ORDER — HYDRALAZINE HCL 20 MG/ML IJ SOLN
10.0000 mg | INTRAMUSCULAR | Status: AC | PRN
  Administered 2023-03-11: 10 mg via INTRAVENOUS
  Filled 2023-03-11: qty 1

## 2023-03-11 MED ORDER — GADOBUTROL 1 MMOL/ML IV SOLN
10.0000 mL | Freq: Once | INTRAVENOUS | Status: AC | PRN
  Administered 2023-03-11: 10 mL via INTRAVENOUS

## 2023-03-11 SURGICAL SUPPLY — 13 items
CATH BALLN WEDGE 5F 110CM (CATHETERS) IMPLANT
CATH SWAN GANZ 7F STRAIGHT (CATHETERS) IMPLANT
GLIDESHEATH SLENDER 7FR .021G (SHEATH) IMPLANT
GUIDEWIRE .025 260CM (WIRE) IMPLANT
KIT MICROPUNCTURE NIT STIFF (SHEATH) IMPLANT
PACK CARDIAC CATHETERIZATION (CUSTOM PROCEDURE TRAY) ×1 IMPLANT
SHEATH PINNACLE 4F 10CM (SHEATH) IMPLANT
SHEATH PINNACLE 5F 10CM (SHEATH) IMPLANT
SHEATH PROBE COVER 6X72 (BAG) IMPLANT
TRANSDUCER W/STOPCOCK (MISCELLANEOUS) IMPLANT
TUBING ART PRESS 72 MALE/FEM (TUBING) IMPLANT
WIRE EMERALD 3MM-J .025X260CM (WIRE) IMPLANT
WIRE EMERALD 3MM-J .035X150CM (WIRE) IMPLANT

## 2023-03-11 NOTE — Progress Notes (Signed)
Progress Note   Patient: Richard [name] KGM:[id number] DOB: 1970-09-18 DOA: 03/04/2023     7 DOS: the patient was seen and examined on 03/11/2023   Brief hospital course: Richard [name] was admitted to the hospital with the working diagnosis of heart failure decompensation.   51/M with past medical history of heart failure, CKD 3B hypertension, prediabetes, CVA, atrial fibrillation, OSA, and obesity class 3 who presented with worsening shortness of breath. He reported 3 days of progressive shortness of breath, lower extremity edema, and abdominal distention. On his initial physical examination his blood pressure was 161/120, 179/130, HR 86, RR 36 and 02 saturation 97% on supplemental 02 per [place], lungs with decreased breath sounds, positive rales bilaterally, heart with S1 and S2 present and regular with no gallops or rubs, no murmurs, abdomen with no distention, positive lower extremity edema +++.   Na 138, K 3,2 Cl 106 bicarbonate 16 glucose 186, bun 25 cr 2,67 BNP 1,563  High sensitive troponin 48 and 69  Wbc 6.5 hgb 11,7 plt 182  Sars covid 19 negative.   Chest radiograph with cardiomegaly, with bilateral hilar vascular congestion and cephalization of the vasculature.   EKG 93 bpm, normal axis, qtc 432, normal intervals, sinus rhythm with sinus arrhythmia, and PVC, poor R R wave progression, with no significant ST segment or T wave changes.   Placed on furosemide for diuresis. Noted in low output heart failure.   10/02 picc line placed and patient started on Milrinone for low output heart failure and furosemide drip. 10/05 off milrinone and transitioned to oral loop diuretic therapy.  10/06 plan for right heart cath tomorrow. Patient requested Unna boots to be removed.  10/07 right heart catheterization mild elevation in filling pressures. Positive pulmonary hypertension.   Assessment and Plan: Acute on chronic systolic (congestive) heart failure (HCC) Echocardiogram with reduced LV  systolic function 25%, global hypokinesis, LV cavity with moderate dilatation, RV systolic function with mild reduction, RVSP 45.9 mmHg. LA and RA with mild dilatation.   Resolved low output heart failure.   10/07 right heart catheterization. RV 65/10 PA 64/31 mean 43 PCWP mean 16  Cardiac output 7.2 and index 2,8  PVR 3,7 [place].   Urine output 3,600 ml   Systolic blood pressure 140 to 150  SV02 64  10/05 Off milrinone Discontinued acetazolamide and metolazone.  Continue with dapagliflozin, and oral torsemide.  After load reduction with Bidil and  Entresto.   Possible discharge tomorrow.   Essential hypertension, benign Continue blood pressure control with bidil and entresto.    Paroxysmal atrial fibrillation (HCC) Continue rate control with amiodarone, anticoagulation with apixaban.   Stage 3b chronic kidney disease (HCC) Hypokalemia. Hyponatremia.   Renal function with serum cr at 2,23 with K at 3,3 and serum bicarbonate at 24, Na 134. Mg 1.7   Continue with SGLT 2 inh and torsemide,   K and Mg correction. Follow up renal function and electrolytes.   Type 2 diabetes mellitus with hyperlipidemia (HCC) Continue glucose cover and monitoring with insulin sliding scale. Continue statin therapy.   History of CVA (cerebrovascular accident) Continue blood pressure control and anticoagulation.   Obesity, diabetes, and hypertension syndrome (HCC) Calculated BMI 48,6         Subjective: Patient with improvement in edema, and dyspnea, unna boots have been removed.   Physical Exam: Vitals:   03/11/23 1200 03/11/23 1230 03/11/23 1300 03/11/23 1531  BP: (!) 148/70 130/73 138/83 (!) 150/86  Pulse: (!) 41 (!) 58 94 (!)  51  Resp: 16 (!) 24 (!) 22 19  Temp:    97.8 F (36.6 C)  TempSrc:    Oral  SpO2: 99% 100% 97% 98%  Weight:      Height:       Neurology awake and alert ENT with mild pallor Cardiovascular with S1 and S2 present and regular with no gallops, rubs  or murmurs No JVD Lower extremity edema trace Respiratory with no rales or wheezing, no rhonchi Abdomen with no distention  Data Reviewed:    Family Communication: no family at the bedside   Disposition: Status is: Inpatient Remains inpatient appropriate because: resolving heart failure exacerbation, possible discharge tomorrow   Planned Discharge Destination: Home    Author: Coralie Keens, MD 03/11/2023 4:27 PM  For on call review [email or web address].

## 2023-03-11 NOTE — Interval H&P Note (Signed)
History and Physical Interval Note:  03/11/2023 9:31 AM  Richard [name]  has presented today for surgery, with the diagnosis of HF.  The various methods of treatment have been discussed with the patient and family. After consideration of risks, benefits and other options for treatment, the patient has consented to  Procedure(s): RIGHT HEART CATH (N/A) as a surgical intervention.  The patient's history has been reviewed, patient examined, no change in status, stable for surgery.  I have reviewed the patient's chart and labs.  Questions were answered to the patient's satisfaction.     Dillian Feig [name]

## 2023-03-11 NOTE — TOC Progression Note (Signed)
Transition of Care [facility]) - Progression Note    Patient Details  Name: Richard [name] MRN: [id number] Date of Birth: Nov 23, 1970  Transition of [facility]) CM/SW Contact  Nicanor Bake Phone Number: [phone] 03/11/2023, 4:26 PM  Clinical Narrative:   HF CSW attempted to meet with pt at bedside. Pt had a sign on the door that stated "bedrest until 13:30." It was after the time, the NT stated that the pt was on the bedside commode. CSW will follow up with pt at a later time.   TOC will continue following.     Expected Discharge Plan: Home/Self Care Barriers to Discharge: Continued Medical Work up  Expected Discharge Plan and Services   Discharge Planning Services: CM Consult   Living arrangements for the past 2 months: Single Family Home                                       Social Determinants of Health (SDOH) Interventions SDOH Screenings   Food Insecurity: No Food Insecurity (03/04/2023)  Housing: Medium Risk (03/04/2023)  Transportation Needs: No Transportation Needs (03/04/2023)  Utilities: Not At Risk (03/04/2023)  Alcohol Screen: Low Risk  (11/13/2021)  Depression (PHQ2-9): Low Risk  (11/24/2021)  Financial Resource Strain: Low Risk  (11/13/2021)  Physical Activity: Sufficiently Active (11/24/2021)  Social Connections: Moderately Integrated (11/24/2021)  Stress: No Stress Concern Present (11/24/2021)  Tobacco Use: Low Risk  (03/04/2023)    Readmission Risk Interventions     No data to display

## 2023-03-11 NOTE — Plan of Care (Signed)
  Problem: Education: Goal: Ability to demonstrate management of disease process will improve Outcome: Progressing Goal: Ability to verbalize understanding of medication therapies will improve Outcome: Progressing   Problem: Activity: Goal: Capacity to carry out activities will improve Outcome: Progressing   Problem: Cardiac: Goal: Ability to achieve and maintain adequate cardiopulmonary perfusion will improve Outcome: Progressing   Problem: Education: Goal: Knowledge of General Education information will improve Description: Including pain rating scale, medication(s)/side effects and non-pharmacologic comfort measures Outcome: Progressing   Problem: Health Behavior/Discharge Planning: Goal: Ability to manage health-related needs will improve Outcome: Progressing   Problem: Clinical Measurements: Goal: Ability to maintain clinical measurements within normal limits will improve Outcome: Progressing Goal: Will remain free from infection Outcome: Progressing Goal: Diagnostic test results will improve Outcome: Progressing Goal: Respiratory complications will improve Outcome: Progressing Goal: Cardiovascular complication will be avoided Outcome: Progressing   Problem: Activity: Goal: Risk for activity intolerance will decrease Outcome: Progressing   Problem: Nutrition: Goal: Adequate nutrition will be maintained Outcome: Progressing   Problem: Coping: Goal: Level of anxiety will decrease Outcome: Progressing   Problem: Elimination: Goal: Will not experience complications related to bowel motility Outcome: Progressing Goal: Will not experience complications related to urinary retention Outcome: Progressing   Problem: Pain Managment: Goal: General experience of comfort will improve Outcome: Progressing   Problem: Safety: Goal: Ability to remain free from injury will improve Outcome: Progressing   Problem: Skin Integrity: Goal: Risk for impaired skin integrity  will decrease Outcome: Progressing   Problem: Coping: Goal: Ability to adjust to condition or change in health will improve Outcome: Progressing   Problem: Fluid Volume: Goal: Ability to maintain a balanced intake and output will improve Outcome: Progressing   Problem: Health Behavior/Discharge Planning: Goal: Ability to identify and utilize available resources and services will improve Outcome: Progressing Goal: Ability to manage health-related needs will improve Outcome: Progressing   Problem: Metabolic: Goal: Ability to maintain appropriate glucose levels will improve Outcome: Progressing   Problem: Nutritional: Goal: Maintenance of adequate nutrition will improve Outcome: Progressing Goal: Progress toward achieving an optimal weight will improve Outcome: Progressing   Problem: Skin Integrity: Goal: Risk for impaired skin integrity will decrease Outcome: Progressing   Problem: Tissue Perfusion: Goal: Adequacy of tissue perfusion will improve Outcome: Progressing

## 2023-03-11 NOTE — Progress Notes (Signed)
Patient ID: Richard [name], male   DOB: 18-Nov-1970, 52 y.o.   MRN: [id number]     Advanced Heart Failure Rounding Note  Subjective:    I/Os -2420 on torsemide 60 mg daily yesterday.  BP high this morning but no meds yet.  He is in NSR.  Creatinine stable at 2.23.   RHC today: Hemodynamics (mmHg) RA mean 8 RV 65/10 PA 64/31, mean 43 PCWP mean 16 Oxygen saturations: PA 70% AO 97% Cardiac Output (Fick) 7.28  Cardiac Index (Fick) 2.85 PVR 3.7 WU PAPi 4.1   Objective:   Weight Range: (!) 142.4 kg Body mass index is 43.78 kg/m.   Vital Signs:   Temp:  [97.4 F (36.3 C)-98.4 F (36.9 C)] 97.5 F (36.4 C) (10/07 0335) Pulse Rate:  [64-113] 68 (10/07 1115) Resp:  [16-35] 16 (10/07 1115) BP: (119-165)/(63-110) 150/103 (10/07 1115) SpO2:  [95 %-99 %] 98 % (10/07 1115) Last BM Date : 03/10/23  Weight change: Filed Weights   03/08/23 0106 03/09/23 0500 03/10/23 1000  Weight: (!) 144.5 kg (!) 145.1 kg (!) 142.4 kg    Intake/Output:   Intake/Output Summary (Last 24 hours) at 03/11/2023 1132 Last data filed at 03/11/2023 0343 Gross per 24 hour  Intake 820 ml  Output 2650 ml  Net -1830 ml     Physical Exam    General: NAD Neck: JVP 8 cm, no thyromegaly or thyroid nodule.  Lungs: Clear to auscultation bilaterally with normal respiratory effort. CV: Nondisplaced PMI.  Heart regular S1/S2, no S3/S4, no murmur.  No peripheral edema.   Abdomen: Soft, nontender, no hepatosplenomegaly, no distention.  Skin: Intact without lesions or rashes.  Neurologic: Alert and oriented x 3.  Psych: Normal affect. Extremities: No clubbing or cyanosis.  HEENT: Normal.   Telemetry  NSR 100s with PACs  EKG   N/A   Labs    CBC No results for input(s): "WBC", "NEUTROABS", "HGB", "HCT", "MCV", "PLT" in the last 72 hours.  Basic Metabolic Panel Recent Labs    40/98/11 1400 03/09/23 0508 03/10/23 0349 03/11/23 0536  NA 134*   < > 137 134*  K 3.4*   < > 3.7 3.3*  CL 93*   < > 103  99  CO2 29   < > 25 24  GLUCOSE 252*   < > 218* 279*  BUN 28*   < > 29* 30*  CREATININE 2.49*   < > 2.24* 2.23*  CALCIUM 8.5*   < > 8.7* 8.4*  MG  --    < > 2.1 1.7  PHOS 3.5  --   --   --    < > = values in this interval not displayed.   Liver Function Tests Recent Labs    03/08/23 1400  ALBUMIN 3.1*   No results for input(s): "LIPASE", "AMYLASE" in the last 72 hours. Cardiac Enzymes No results for input(s): "CKTOTAL", "CKMB", "CKMBINDEX", "TROPONINI" in the last 72 hours.  BNP: BNP (last 3 results) Recent Labs    03/04/23 1300  BNP 1,563.8*   Other results:   Imaging    CARDIAC CATHETERIZATION  Result Date: 03/11/2023 1. Mildly elevated PCWP 2. Moderate mixed pulmonary venous/pulmonary arterial hypertension 3. Preserved cardiac output.     Medications:     Scheduled Medications:  amiodarone  200 mg Oral Daily   apixaban  5 mg Oral BID   atorvastatin  40 mg Oral Daily   Chlorhexidine Gluconate Cloth  6 each Topical Daily   dapagliflozin propanediol  10 mg Oral Daily   insulin aspart  0-9 Units Subcutaneous TID WC   isosorbide-hydrALAZINE  2 tablet Oral TID   potassium chloride  40 mEq Oral Q4H   sacubitril-valsartan  1 tablet Oral BID   sodium chloride flush  3 mL Intravenous Q12H   sodium chloride flush  3 mL Intravenous Q12H   torsemide  60 mg Oral Daily    Infusions:  sodium chloride 10 mL/hr at 03/09/23 1836   sodium chloride     magnesium sulfate bolus IVPB      PRN Medications: sodium chloride, sodium chloride, acetaminophen, hydrALAZINE, labetalol, ondansetron (ZOFRAN) IV, polyethylene glycol, sodium chloride flush, sodium chloride flush  Patient Profile   51/M w severe biventricular systolic heart failure EF less than 20% with severely reduced RV on TEE 6/23, NICM, CKD 3B, poorly controlled hypertension, prediabetes, CVA, atrial fibrillation, OSA, obesity, CHF presented with worsening shortness of breath.   Admitted with A/C HFrEF.    Assessment/Plan   1. Acute on chronic systolic CHF:  NICM, likely 2/2 poorly controlled HTN + tachy-mediated from rapid Afib (though no AF documented since 8/23).  Diagnosis in 2014 with EF 25-30%, LHC at that time with no CAD.  By 10/17, EF up to 50-55%.  In 6/23, EF by echo down to 25-30% in setting of AF/RVR and uncontrolled HTN off meds.  Had DCCV in 6/23 x 2 episodes and back in NSR on amiodarone. Poor compliance with outpatient followup, readmitted 10/24 with CHF exacerbation and echo with EF 25%, moderate LV dilation, mild RV dysfunction.  Hypertensive, says he has been taking meds at home.  Low output HF by co-ox, started on milrinone and later titrated off.  RHC today showed mildly elevated PCWP at 16 with moderate mixed PAH/PVH, stable CI off milrinone.   Good diuresis yesterday on torsemide.  - Continue torsemide 60 mg daily.   - Hold off on coronary angiography with elevated creatinine.  - Cardiac MRI done, I will review.  - Continue torsemide 60 mg daily, still with some volume overload with mildly elevated PCWP but looks like he diuresed well yesterday.    - Continue dapagliflozin 10 mg daily.  - Continue Bidil 2 tabs tid.  - Continue Entresto 24/26 bid.  - Add spironolactone 12.5 daily.  2. Atrial fibrillation: Paroxysmal. Diagnosed 6/23, had DCCV on 2 occasions in 6/23 then held NSR on amiodarone.  Sleep study with moderate OSA in 8/24.  He is in NSR with PACs.  - Continue amiodarone 200 mg daily.   - Will need to start CPAP as outpatient.   - Continue apixaban.  - Reconsider AF ablation with EP followup.  3. Hypertension: Poorly controlled.  - Adding spironolactone today.  4. CKD Stage IV: Suspect hypertensive/ DM nephropathy. Baseline creatinine seems to be around 2.  Creatinine trending down slowly, 2.23 today.  - Previously followed by Nephrology but lost to f/u will need to re-establish.  - Continue Farxiga 10 mg daily 5. Type 2 DM: Recent Hgb A1c 6.4.  - Continue  farxiga 10 mg daily.  6. H/o CVA: Cryptogenetic (2020). Refused loop recorder.  Now on Eliquis with AF.  7. OSA: Sleep study + for OSA on 8/24. Pt states he was not aware of results. - CPAP recommended 8. Pulmonary hypertension: Moderate mixed PAH/PVH on RHC.  Continue gentle diuresis.  He also will need to start CPAP as OSA likely plays a role.   If volume status and creatinine remain stable, will likely be  ready for home tomorrow.    He is uninsured and will need patient assistance for medications.   Length of Stay: 7  Marca Ancona, MD  03/11/2023, 11:32 AM  Advanced Heart Failure Team Pager [phone] (M-F; 7a - 5p)  Please contact [facility] for night-coverage after hours (5p -7a ) and weekends on [email or web address]

## 2023-03-12 ENCOUNTER — Encounter (HOSPITAL_COMMUNITY): Payer: Self-pay | Admitting: Cardiology

## 2023-03-12 ENCOUNTER — Other Ambulatory Visit (HOSPITAL_COMMUNITY): Payer: Self-pay

## 2023-03-12 LAB — GLUCOSE, CAPILLARY
Glucose-Capillary: 141 mg/dL — ABNORMAL HIGH (ref 70–99)
Glucose-Capillary: 198 mg/dL — ABNORMAL HIGH (ref 70–99)
Glucose-Capillary: 206 mg/dL — ABNORMAL HIGH (ref 70–99)

## 2023-03-12 LAB — CBC
HCT: 36.3 % — ABNORMAL LOW (ref 39.0–52.0)
Hemoglobin: 11.1 g/dL — ABNORMAL LOW (ref 13.0–17.0)
MCH: 29.4 pg (ref 26.0–34.0)
MCHC: 30.6 g/dL (ref 30.0–36.0)
MCV: 96 fL (ref 80.0–100.0)
Platelets: 217 10*3/uL (ref 150–400)
RBC: 3.78 MIL/uL — ABNORMAL LOW (ref 4.22–5.81)
RDW: 15 % (ref 11.5–15.5)
WBC: 8.6 10*3/uL (ref 4.0–10.5)
nRBC: 0 % (ref 0.0–0.2)

## 2023-03-12 LAB — BASIC METABOLIC PANEL
Anion gap: 11 (ref 5–15)
BUN: 28 mg/dL — ABNORMAL HIGH (ref 6–20)
CO2: 25 mmol/L (ref 22–32)
Calcium: 8.7 mg/dL — ABNORMAL LOW (ref 8.9–10.3)
Chloride: 99 mmol/L (ref 98–111)
Creatinine, Ser: 2.13 mg/dL — ABNORMAL HIGH (ref 0.61–1.24)
GFR, Estimated: 37 mL/min — ABNORMAL LOW (ref >60)
Glucose, Bld: 244 mg/dL — ABNORMAL HIGH (ref 70–99)
Potassium: 3.7 mmol/L (ref 3.5–5.1)
Sodium: 135 mmol/L (ref 135–145)

## 2023-03-12 LAB — COOXEMETRY PANEL
Carboxyhemoglobin: 2.8 % — ABNORMAL HIGH (ref 0.5–1.5)
Methemoglobin: 0.8 % (ref 0.0–1.5)
O2 Saturation: 82.4 %
Total hemoglobin: 12 g/dL (ref 12.0–16.0)

## 2023-03-12 LAB — ANGIOTENSIN CONVERTING ENZYME: Angiotensin-Converting Enzyme: 32 U/L (ref 14–82)

## 2023-03-12 MED ORDER — HYDRALAZINE HCL 50 MG PO TABS
75.0000 mg | ORAL_TABLET | Freq: Three times a day (TID) | ORAL | Status: DC
  Administered 2023-03-12 (×2): 75 mg via ORAL
  Filled 2023-03-12 (×2): qty 1

## 2023-03-12 MED ORDER — HYDRALAZINE HCL 25 MG PO TABS
75.0000 mg | ORAL_TABLET | Freq: Three times a day (TID) | ORAL | 0 refills | Status: DC
  Filled 2023-03-12: qty 270, 30d supply, fill #0

## 2023-03-12 MED ORDER — ISOSORBIDE MONONITRATE ER 30 MG PO TB24
30.0000 mg | ORAL_TABLET | Freq: Every day | ORAL | Status: DC
  Administered 2023-03-12: 30 mg via ORAL
  Filled 2023-03-12: qty 1

## 2023-03-12 MED ORDER — ATORVASTATIN CALCIUM 40 MG PO TABS
40.0000 mg | ORAL_TABLET | Freq: Every day | ORAL | 0 refills | Status: DC
Start: 2023-03-12 — End: 2023-04-11
  Filled 2023-03-12: qty 30, 30d supply, fill #0

## 2023-03-12 MED ORDER — MAGNESIUM SULFATE 2 GM/50ML IV SOLN
2.0000 g | Freq: Once | INTRAVENOUS | Status: AC
  Administered 2023-03-12: 2 g via INTRAVENOUS
  Filled 2023-03-12: qty 50

## 2023-03-12 MED ORDER — SPIRONOLACTONE 25 MG PO TABS: 25.0000 mg | ORAL_TABLET | Freq: Every day | ORAL | Status: DC

## 2023-03-12 MED ORDER — SACUBITRIL-VALSARTAN 24-26 MG PO TABS
1.0000 | ORAL_TABLET | Freq: Two times a day (BID) | ORAL | 0 refills | Status: AC
Start: 2023-03-12 — End: 2023-05-05
  Filled 2023-03-12 – 2023-04-05 (×3): qty 60, 30d supply, fill #0

## 2023-03-12 MED ORDER — CARVEDILOL 12.5 MG PO TABS
12.5000 mg | ORAL_TABLET | Freq: Two times a day (BID) | ORAL | 0 refills | Status: DC
  Filled 2023-03-12: qty 60, 30d supply, fill #0

## 2023-03-12 MED ORDER — DAPAGLIFLOZIN PROPANEDIOL 10 MG PO TABS
10.0000 mg | ORAL_TABLET | Freq: Every day | ORAL | 0 refills | Status: DC
  Filled 2023-03-12: qty 30, 30d supply, fill #0

## 2023-03-12 MED ORDER — TORSEMIDE 20 MG PO TABS
60.0000 mg | ORAL_TABLET | Freq: Every day | ORAL | 0 refills | Status: DC
  Filled 2023-03-12: qty 90, 30d supply, fill #0

## 2023-03-12 MED ORDER — AMIODARONE HCL 200 MG PO TABS
200.0000 mg | ORAL_TABLET | Freq: Every day | ORAL | 0 refills | Status: DC
  Filled 2023-03-12: qty 30, 30d supply, fill #0

## 2023-03-12 MED ORDER — POTASSIUM CHLORIDE CRYS ER 20 MEQ PO TBCR
40.0000 meq | EXTENDED_RELEASE_TABLET | Freq: Once | ORAL | Status: AC
  Administered 2023-03-12: 40 meq via ORAL
  Filled 2023-03-12: qty 2

## 2023-03-12 MED ORDER — CARVEDILOL 12.5 MG PO TABS
12.5000 mg | ORAL_TABLET | Freq: Two times a day (BID) | ORAL | Status: DC
  Administered 2023-03-12: 12.5 mg via ORAL
  Filled 2023-03-12: qty 1

## 2023-03-12 MED ORDER — APIXABAN 5 MG PO TABS
5.0000 mg | ORAL_TABLET | Freq: Two times a day (BID) | ORAL | 0 refills | Status: DC
Start: 2023-03-12 — End: 2023-05-05
  Filled 2023-03-12 – 2023-04-05 (×3): qty 60, 30d supply, fill #0

## 2023-03-12 MED ORDER — ISOSORBIDE MONONITRATE ER 60 MG PO TB24: 60.0000 mg | ORAL_TABLET | Freq: Every day | ORAL | Status: DC

## 2023-03-12 MED ORDER — ISOSORBIDE MONONITRATE ER 60 MG PO TB24
60.0000 mg | ORAL_TABLET | Freq: Every day | ORAL | 0 refills | Status: DC
  Filled 2023-03-12: qty 30, 30d supply, fill #0

## 2023-03-12 MED ORDER — SPIRONOLACTONE 25 MG PO TABS
25.0000 mg | ORAL_TABLET | Freq: Every day | ORAL | 0 refills | Status: DC
  Filled 2023-03-12: qty 30, 30d supply, fill #0

## 2023-03-12 NOTE — Progress Notes (Addendum)
Patient ID: Richard [name], male   DOB: 12/16/1970, 52 y.o.   MRN: [id number]     Advanced Heart Failure Rounding Note  Subjective:    I/Os -2.4L on torsemide 60 mg daily yesterday.  BP high this morning but no meds yet.  He is in NSR.  Creatinine stable at 2.23 10/7, pending today. Weight down 8lbs. Overall down 45 lbs.   Sitting on EOB, has been ambulating in his room . CVP unhooked.   RHC 10/7: Hemodynamics (mmHg) RA mean 8 RV 65/10 PA 64/31, mean 43 PCWP mean 16 Oxygen saturations: PA 70% AO 97% Cardiac Output (Fick) 7.28  Cardiac Index (Fick) 2.85 PVR 3.7 WU PAPi 4.1   Cardiac MRI 10/7 showed mildly dilated LV with mild concentric LVH. LVEF 30%, diffuse hypokinesis. RV EF 27%. Non-coronary pattern mid-wall LGE in the basal to mid septum, basal inferior wall, and basal inferior RV wall. Consider infiltrative disease such as cardiac sarcoidosis or prior myocarditis  Objective:   Weight Range: (!) 138.5 kg Body mass index is 42.58 kg/m.   Vital Signs:   Temp:  [97.6 F (36.4 C)-99.3 F (37.4 C)] 98.4 F (36.9 C) (10/08 0541) Pulse Rate:  [0-120] 99 (10/08 0600) Resp:  [16-42] 20 (10/08 0600) BP: (130-201)/(69-121) 154/69 (10/08 0541) SpO2:  [93 %-100 %] 96 % (10/08 0541) Weight:  [138.5 kg] 138.5 kg (10/08 0541) Last BM Date : 03/11/23  Weight change: Filed Weights   03/09/23 0500 03/10/23 1000 03/12/23 0541  Weight: (!) 145.1 kg (!) 142.4 kg (!) 138.5 kg   Intake/Output:   Intake/Output Summary (Last 24 hours) at 03/12/2023 0830 Last data filed at 03/12/2023 0559 Gross per 24 hour  Intake 846.87 ml  Output 2400 ml  Net -1553.13 ml     Physical Exam  General:  well appearing.  No respiratory difficulty HEENT: normal Neck: supple. JVD ~7 cm. Carotids 2+ bilat; no bruits. No lymphadenopathy or thyromegaly appreciated. Cor: PMI nondisplaced. Regular rate & rhythm. No rubs, gallops or murmurs. Lungs: clear Abdomen: obese, soft, nontender, nondistended. No  hepatosplenomegaly. No bruits or masses. Good bowel sounds. Extremities: no cyanosis, clubbing, rash, trace BLE edema. PICC RUE  Neuro: alert & oriented x 3, cranial nerves grossly intact. moves all 4 extremities w/o difficulty. Affect pleasant.   Telemetry   ST low 100s, PVCs and PACs (Personally reviewed)    EKG   N/A   Labs    CBC Recent Labs    03/11/23 1016  HGB 12.9*  HCT 38.0*    Basic Metabolic Panel Recent Labs    40/98/11 0349 03/11/23 0536 03/11/23 1016  NA 137 134* 141  K 3.7 3.3* 3.6  CL 103 99  --   CO2 25 24  --   GLUCOSE 218* 279*  --   BUN 29* 30*  --   CREATININE 2.24* 2.23*  --   CALCIUM 8.7* 8.4*  --   MG 2.1 1.7  --    Liver Function Tests No results for input(s): "AST", "ALT", "ALKPHOS", "BILITOT", "PROT", "ALBUMIN" in the last 72 hours.  No results for input(s): "LIPASE", "AMYLASE" in the last 72 hours. Cardiac Enzymes No results for input(s): "CKTOTAL", "CKMB", "CKMBINDEX", "TROPONINI" in the last 72 hours.  BNP: BNP (last 3 results) Recent Labs    03/04/23 1300  BNP 1,563.8*   Other results:   Imaging    CARDIAC CATHETERIZATION  Result Date: 03/11/2023 1. Mildly elevated PCWP 2. Moderate mixed pulmonary venous/pulmonary arterial hypertension 3. Preserved cardiac  output.     Medications:     Scheduled Medications:  amiodarone  200 mg Oral Daily   apixaban  5 mg Oral BID   atorvastatin  40 mg Oral Daily   Chlorhexidine Gluconate Cloth  6 each Topical Daily   dapagliflozin propanediol  10 mg Oral Daily   insulin aspart  0-9 Units Subcutaneous TID WC   isosorbide-hydrALAZINE  2 tablet Oral TID   sacubitril-valsartan  1 tablet Oral BID   sodium chloride flush  3 mL Intravenous Q12H   spironolactone  12.5 mg Oral Daily   torsemide  60 mg Oral Daily    Infusions:  magnesium sulfate bolus IVPB      PRN Medications: acetaminophen, ondansetron (ZOFRAN) IV, polyethylene glycol, sodium chloride flush  Patient Profile    51/M w severe biventricular systolic heart failure EF less than 20% with severely reduced RV on TEE 6/23, NICM, CKD 3B, poorly controlled hypertension, prediabetes, CVA, atrial fibrillation, OSA, obesity, CHF presented with worsening shortness of breath.   Admitted with A/C HFrEF.   Assessment/Plan  1. Acute on chronic systolic CHF:  NICM, likely 2/2 poorly controlled HTN + tachy-mediated from rapid Afib (though no AF documented since 8/23).  Diagnosis in 2014 with EF 25-30%, LHC at that time with no CAD.  By 10/17, EF up to 50-55%.  In 6/23, EF by echo down to 25-30% in setting of AF/RVR and uncontrolled HTN off meds.  Had DCCV in 6/23 x 2 episodes and back in NSR on amiodarone. Poor compliance with outpatient followup, readmitted 10/24 with CHF exacerbation and echo with EF 25%, moderate LV dilation, mild RV dysfunction.  Hypertensive, says he has been taking meds at home.  Low output HF by co-ox, started on milrinone and later titrated off.  RHC 10/7 showed mildly elevated PCWP at 16 with moderate mixed PAH/PVH, stable CI off milrinone.   Good diuresis with torsemide.  - Volume looks much better today.  Continue torsemide 60 mg daily.  - Hold off on coronary angiography with elevated creatinine.  - Cardiac MRI 10/7 showed mildly dilated LV with mild concentric LVH. LVEF 30%, diffuse hypokinesis. RV EF 27%. Non-coronary pattern mid-wall LGE in the basal to mid septum, basal inferior wall, and basal inferior RV wall. Consider infiltrative disease such as cardiac sarcoidosis or prior myocarditis. Consider PET scan, will discuss with MD.  - Continue dapagliflozin 10 mg daily.  - Will switch Bidil to hydralazine and imdur today (would not be able to get bidil as he's uninsured). Start Hydralazine 75 mg TID and Imdur 30 mg daily - Continue Entresto 24/26 bid, hesitant to increase with renal function.  - Increase spironolactone 12.5>25 2. Atrial fibrillation: Paroxysmal. Diagnosed 6/23, had DCCV on 2  occasions in 6/23 then held NSR on amiodarone.  Sleep study with moderate OSA in 8/24.  He is in NSR with PACs.  - Continue amiodarone 200 mg daily.   - Will need to start CPAP as outpatient.   - Continue apixaban.  - Reconsider AF ablation with EP followup.  3. Hypertension: Poorly controlled.  - Increase spironolactone today.  - Switching BIDIL to Hydral/Imdur 4. CKD Stage IV: Suspect hypertensive/ DM nephropathy. Baseline creatinine seems to be around 2.  Creatinine trending down slowly, 2.23 10/7, pending today.  - Previously followed by Nephrology but lost to f/u will need to re-establish.  - Continue Farxiga 10 mg daily 5. Type 2 DM: Recent Hgb A1c 6.4.  - Continue farxiga 10 mg daily.  6. H/o CVA: Cryptogenetic (2020). Refused loop recorder.  Now on Eliquis with AF.  7. OSA: Sleep study + for OSA on 8/24. Pt states he was not aware of results. - CPAP recommended 8. Pulmonary hypertension: Moderate mixed PAH/PVH on RHC.  Continue diuresis.  He also will need to start CPAP as OSA likely plays a role.   Appears stable for discharge, pending MD eval. Has f/u scheduled in AHF clinic.  AHF meds at discharge: amiodarone 200 mg daily, coreg 12.5 mg BID,  eliquis 5 mg BID, atorvastatin 40 mg daily, farxiga 10 mg daily, hydralazine 75 mg daily, imdur 60 mg daily, entresto 24-26 mg BID, spironolactone 25, torsemide 60 mg daily.   He is uninsured and will need patient assistance for medications.   Length of Stay: 8  Alen Bleacher, NP  03/12/2023, 8:30 AM  Advanced Heart Failure Team Pager [phone] (M-F; 7a - 5p)  Please contact [facility] for night-coverage after hours (5p -7a ) and weekends on [email or web address]  Patient seen with NP, agree with the above note.   Patient states that his breathing is much better, weight continues to trend down.  BP still elevated.   General: NAD Neck:JVP 8 cm, no thyromegaly or thyroid nodule.  Lungs: Clear to auscultation bilaterally with normal  respiratory effort. CV: Nondisplaced PMI.  Heart regular S1/S2, no S3/S4, no murmur.  1+ ankle edema.  Abdomen: Soft, nontender, no hepatosplenomegaly, no distention.  Skin: Intact without lesions or rashes.  Neurologic: Alert and oriented x 3.  Psych: Normal affect. Extremities: No clubbing or cyanosis.  HEENT: Normal.   He remains in NSR, has occasional PVCs.  I do not think that his QTc is significantly elevated, think ECG counted the combined T wave and P wave reading falsely prolonged QTc.   Cardiac MRI shows LV EF 30%, RV EF 27%, LGE in a pattern that could be consistent with prior myocarditis versus possible cardiac sarcoidosis. ACE level normal, no lung problems or FH of sarcoidosis.  - Would get cardiac PET as outpatient.  CVP around 8.  Mild residual volume overload on exam but diuresing well and losing weight on torsemide. He can restart Coreg at 12.5 mg bid today (good CO on RHC and co-ox 82% today) and can increase spironolactone to 25 mg daily.  Creatinine trending down, 2.13 today.  I think that he can go home today on the above meds with close followup in CHF clinic.   Marca Ancona 03/12/2023 1:06 PM

## 2023-03-12 NOTE — Progress Notes (Signed)
PT notified nursing staff that patient is in A Fib. Patient is sitting at the edge of bed, asymptomatic and 0/10 chest pain. Primary RN obtained EKG and notified CHF team. CHF team to review EKG. RN educated patient to notify nursing staff any changes to symptoms. Call bell within reach. No new orders.

## 2023-03-12 NOTE — Discharge Summary (Signed)
3. Preserved cardiac output.   [place] EKG SITE RITE  Result Date: 03/06/2023 If Site Rite image not attached, placement could not be confirmed due to current cardiac rhythm.  ECHOCARDIOGRAM COMPLETE  Result Date: 03/05/2023    ECHOCARDIOGRAM REPORT   Patient Name:   Richard [name] Date of Exam: 03/05/2023 Medical Rec #:  [id number]     Height:       71.0 in Accession #:    [id number]    Weight:       324.7 lb Date of Birth:  [birth year]     BSA:          2.591 m Patient Age:    52 years      BP:           119/81 mmHg Patient Gender: M             HR:           79 bpm. Exam Location:  Inpatient Procedure: 2D Echo, Cardiac Doppler, Color Doppler and Intracardiac            Opacification Agent Indications:    CHF-Acute Systolic I50.21  History:        Patient has prior history of Echocardiogram examinations, most                 recent  11/09/2021. CHF; Risk Factors:Diabetes and Hypertension.  Sonographer:    Harriette Bouillon RDCS Referring Phys: [id number] Cecille Po MELVIN IMPRESSIONS  1. Left ventricular ejection fraction, by estimation, is 25%. The left ventricle has severely decreased function. The left ventricle demonstrates global hypokinesis. The left ventricular internal cavity size was moderately dilated. Left ventricular diastolic parameters are consistent with Grade II diastolic dysfunction (pseudonormalization).  2. Right ventricular systolic function is mildly reduced. The right ventricular size is normal. There is moderately elevated pulmonary artery systolic pressure. The estimated right ventricular systolic pressure is 45.9 mmHg.  3. Left atrial size was mildly dilated.  4. Right atrial size was mildly dilated.  5. The mitral valve is normal in structure. Trivial mitral valve regurgitation. No evidence of mitral stenosis.  6. The aortic valve is tricuspid. There is mild calcification of the aortic valve. Aortic valve regurgitation is not visualized. No aortic stenosis is present.  7. The inferior vena cava is dilated in size with <50% respiratory variability, suggesting right atrial pressure of 15 mmHg.  8. Technically difficult study with very poor images. FINDINGS  Left Ventricle: Left ventricular ejection fraction, by estimation, is 25%. The left ventricle has severely decreased function. The left ventricle demonstrates global hypokinesis. Definity contrast agent was given IV to delineate the left ventricular endocardial borders. The left ventricular internal cavity size was moderately dilated. There is no left ventricular hypertrophy. Left ventricular diastolic parameters are consistent with Grade II diastolic dysfunction (pseudonormalization). Right Ventricle: The right ventricular size is normal. Right vetricular wall thickness was not well visualized. Right ventricular systolic function is mildly reduced. There is moderately  elevated pulmonary artery systolic pressure. The tricuspid regurgitant velocity is 2.78 m/s, and with an assumed right atrial pressure of 15 mmHg, the estimated right ventricular systolic pressure is 45.9 mmHg. Left Atrium: Left atrial size was mildly dilated. Right Atrium: Right atrial size was mildly dilated. Pericardium: There is no evidence of pericardial effusion. Mitral Valve: The mitral valve is normal in structure. There is mild calcification of the mitral valve leaflet(s). Mild mitral annular calcification. Trivial mitral valve regurgitation. No evidence of mitral valve stenosis. Tricuspid Valve:  Physician Discharge Summary   Patient: Richard [name] MRN: [id number] DOB: 1971-01-01  Admit date:     03/04/2023  Discharge date: 03/12/23  Discharge Physician: York Ram Brecken Walth   PCP: Pcp, No   Recommendations at discharge:    Patient has been placed on heart failure guideline directed medical therapy with entresto, hydralazine/isosorbide, spironolactone, and empagliflozin. Resume carvedilol at the time of discharge at low dose.   Diuresis with torsemide 60 mg daily. Follow up renal function and electrolytes in 7 days. Follow up with primary care in 7 to 10 days.  Follow up with heart failure clinic as scheduled.    Discharge Diagnoses: Active Problems:   Acute on chronic systolic (congestive) heart failure (HCC)   Essential hypertension, benign   Paroxysmal atrial fibrillation (HCC)   Stage 3b chronic kidney disease (HCC)   Type 2 diabetes mellitus with hyperlipidemia (HCC)   History of CVA (cerebrovascular accident)   Obesity, diabetes, and hypertension syndrome (HCC)  Resolved Problems:   * No resolved hospital problems. [facility] Course: Richard [name] was admitted to the hospital with the working diagnosis of heart failure decompensation.   52/M with past medical history of heart failure, CKD 3B hypertension,T2DM, CVA, atrial fibrillation, OSA, and obesity class 3 who presented with worsening shortness of breath. He reported 3 days of progressive shortness of breath, lower extremity edema, and abdominal distention. Positive concerns about non compliance with medications at home. On his initial physical examination his blood pressure was 161/120, 179/130, HR 86, RR 36 and 02 saturation 97% on supplemental 02 per Ripley, lungs with decreased breath sounds, positive rales bilaterally, heart with S1 and S2 present and regular with no gallops or rubs, no murmurs, abdomen with no distention, positive lower extremity edema +++.   Na 138, K 3,2 Cl 106 bicarbonate 16 glucose 186, bun  25 cr 2,67 BNP 1,563  High sensitive troponin 48 and 69  Wbc 6.5 hgb 11,7 plt 182  Sars covid 19 negative.   Chest radiograph with cardiomegaly, with bilateral hilar vascular congestion and cephalization of the vasculature.   EKG 93 bpm, normal axis, qtc 432, normal intervals, sinus rhythm with sinus arrhythmia, and PVC, poor R R wave progression, with no significant ST segment or T wave changes.   Placed on furosemide for diuresis. Noted in low output heart failure.   10/02 picc line placed and patient started on Milrinone for low output heart failure and furosemide drip. 10/05 off milrinone and transitioned to oral loop diuretic therapy.  10/06 plan for right heart cath tomorrow. Patient requested Unna boots to be removed.  10/07 right heart catheterization mild elevation in filling pressures. Positive pulmonary hypertension.  10/08 patient continue to improve hemodynamics. Plan to continue medical therapy as outpatient and follow up with heart failure clinic.   Assessment and Plan: Acute on chronic systolic (congestive) heart failure (HCC) Echocardiogram with reduced LV systolic function 25%, global hypokinesis, LV cavity with moderate dilatation, RV systolic function with mild reduction, RVSP 45.9 mmHg. LA and RA with mild dilatation.   Acute on chronic core pulmonale. Pulmonary hypertension.   10/07 right heart catheterization. RV 65/10 PA 64/31 mean 43 PCWP mean 16  Cardiac output 7.2 and index 2,8  PVR 3,7 Woods.   Patient required milrinone and high dose diuretic therapy (sequential nephron blockade with acetazolamide, empagliflozin, furosemide and metolazone, spironolactone), resolving low out put state.   At the time of his discharge his volume status is negative, -30,243 ml, with significant improvement  3. Preserved cardiac output.   [place] EKG SITE RITE  Result Date: 03/06/2023 If Site Rite image not attached, placement could not be confirmed due to current cardiac rhythm.  ECHOCARDIOGRAM COMPLETE  Result Date: 03/05/2023    ECHOCARDIOGRAM REPORT   Patient Name:   Richard [name] Date of Exam: 03/05/2023 Medical Rec #:  [id number]     Height:       71.0 in Accession #:    [id number]    Weight:       324.7 lb Date of Birth:  [birth year]     BSA:          2.591 m Patient Age:    52 years      BP:           119/81 mmHg Patient Gender: M             HR:           79 bpm. Exam Location:  Inpatient Procedure: 2D Echo, Cardiac Doppler, Color Doppler and Intracardiac            Opacification Agent Indications:    CHF-Acute Systolic I50.21  History:        Patient has prior history of Echocardiogram examinations, most                 recent  11/09/2021. CHF; Risk Factors:Diabetes and Hypertension.  Sonographer:    Harriette Bouillon RDCS Referring Phys: [id number] Cecille Po MELVIN IMPRESSIONS  1. Left ventricular ejection fraction, by estimation, is 25%. The left ventricle has severely decreased function. The left ventricle demonstrates global hypokinesis. The left ventricular internal cavity size was moderately dilated. Left ventricular diastolic parameters are consistent with Grade II diastolic dysfunction (pseudonormalization).  2. Right ventricular systolic function is mildly reduced. The right ventricular size is normal. There is moderately elevated pulmonary artery systolic pressure. The estimated right ventricular systolic pressure is 45.9 mmHg.  3. Left atrial size was mildly dilated.  4. Right atrial size was mildly dilated.  5. The mitral valve is normal in structure. Trivial mitral valve regurgitation. No evidence of mitral stenosis.  6. The aortic valve is tricuspid. There is mild calcification of the aortic valve. Aortic valve regurgitation is not visualized. No aortic stenosis is present.  7. The inferior vena cava is dilated in size with <50% respiratory variability, suggesting right atrial pressure of 15 mmHg.  8. Technically difficult study with very poor images. FINDINGS  Left Ventricle: Left ventricular ejection fraction, by estimation, is 25%. The left ventricle has severely decreased function. The left ventricle demonstrates global hypokinesis. Definity contrast agent was given IV to delineate the left ventricular endocardial borders. The left ventricular internal cavity size was moderately dilated. There is no left ventricular hypertrophy. Left ventricular diastolic parameters are consistent with Grade II diastolic dysfunction (pseudonormalization). Right Ventricle: The right ventricular size is normal. Right vetricular wall thickness was not well visualized. Right ventricular systolic function is mildly reduced. There is moderately  elevated pulmonary artery systolic pressure. The tricuspid regurgitant velocity is 2.78 m/s, and with an assumed right atrial pressure of 15 mmHg, the estimated right ventricular systolic pressure is 45.9 mmHg. Left Atrium: Left atrial size was mildly dilated. Right Atrium: Right atrial size was mildly dilated. Pericardium: There is no evidence of pericardial effusion. Mitral Valve: The mitral valve is normal in structure. There is mild calcification of the mitral valve leaflet(s). Mild mitral annular calcification. Trivial mitral valve regurgitation. No evidence of mitral valve stenosis. Tricuspid Valve:  Physician Discharge Summary   Patient: Richard [name] MRN: [id number] DOB: 1971-01-01  Admit date:     03/04/2023  Discharge date: 03/12/23  Discharge Physician: York Ram Brecken Walth   PCP: Pcp, No   Recommendations at discharge:    Patient has been placed on heart failure guideline directed medical therapy with entresto, hydralazine/isosorbide, spironolactone, and empagliflozin. Resume carvedilol at the time of discharge at low dose.   Diuresis with torsemide 60 mg daily. Follow up renal function and electrolytes in 7 days. Follow up with primary care in 7 to 10 days.  Follow up with heart failure clinic as scheduled.    Discharge Diagnoses: Active Problems:   Acute on chronic systolic (congestive) heart failure (HCC)   Essential hypertension, benign   Paroxysmal atrial fibrillation (HCC)   Stage 3b chronic kidney disease (HCC)   Type 2 diabetes mellitus with hyperlipidemia (HCC)   History of CVA (cerebrovascular accident)   Obesity, diabetes, and hypertension syndrome (HCC)  Resolved Problems:   * No resolved hospital problems. [facility] Course: Richard [name] was admitted to the hospital with the working diagnosis of heart failure decompensation.   52/M with past medical history of heart failure, CKD 3B hypertension,T2DM, CVA, atrial fibrillation, OSA, and obesity class 3 who presented with worsening shortness of breath. He reported 3 days of progressive shortness of breath, lower extremity edema, and abdominal distention. Positive concerns about non compliance with medications at home. On his initial physical examination his blood pressure was 161/120, 179/130, HR 86, RR 36 and 02 saturation 97% on supplemental 02 per Ripley, lungs with decreased breath sounds, positive rales bilaterally, heart with S1 and S2 present and regular with no gallops or rubs, no murmurs, abdomen with no distention, positive lower extremity edema +++.   Na 138, K 3,2 Cl 106 bicarbonate 16 glucose 186, bun  25 cr 2,67 BNP 1,563  High sensitive troponin 48 and 69  Wbc 6.5 hgb 11,7 plt 182  Sars covid 19 negative.   Chest radiograph with cardiomegaly, with bilateral hilar vascular congestion and cephalization of the vasculature.   EKG 93 bpm, normal axis, qtc 432, normal intervals, sinus rhythm with sinus arrhythmia, and PVC, poor R R wave progression, with no significant ST segment or T wave changes.   Placed on furosemide for diuresis. Noted in low output heart failure.   10/02 picc line placed and patient started on Milrinone for low output heart failure and furosemide drip. 10/05 off milrinone and transitioned to oral loop diuretic therapy.  10/06 plan for right heart cath tomorrow. Patient requested Unna boots to be removed.  10/07 right heart catheterization mild elevation in filling pressures. Positive pulmonary hypertension.  10/08 patient continue to improve hemodynamics. Plan to continue medical therapy as outpatient and follow up with heart failure clinic.   Assessment and Plan: Acute on chronic systolic (congestive) heart failure (HCC) Echocardiogram with reduced LV systolic function 25%, global hypokinesis, LV cavity with moderate dilatation, RV systolic function with mild reduction, RVSP 45.9 mmHg. LA and RA with mild dilatation.   Acute on chronic core pulmonale. Pulmonary hypertension.   10/07 right heart catheterization. RV 65/10 PA 64/31 mean 43 PCWP mean 16  Cardiac output 7.2 and index 2,8  PVR 3,7 Woods.   Patient required milrinone and high dose diuretic therapy (sequential nephron blockade with acetazolamide, empagliflozin, furosemide and metolazone, spironolactone), resolving low out put state.   At the time of his discharge his volume status is negative, -30,243 ml, with significant improvement  Physician Discharge Summary   Patient: Richard [name] MRN: [id number] DOB: 1971-01-01  Admit date:     03/04/2023  Discharge date: 03/12/23  Discharge Physician: York Ram Brecken Walth   PCP: Pcp, No   Recommendations at discharge:    Patient has been placed on heart failure guideline directed medical therapy with entresto, hydralazine/isosorbide, spironolactone, and empagliflozin. Resume carvedilol at the time of discharge at low dose.   Diuresis with torsemide 60 mg daily. Follow up renal function and electrolytes in 7 days. Follow up with primary care in 7 to 10 days.  Follow up with heart failure clinic as scheduled.    Discharge Diagnoses: Active Problems:   Acute on chronic systolic (congestive) heart failure (HCC)   Essential hypertension, benign   Paroxysmal atrial fibrillation (HCC)   Stage 3b chronic kidney disease (HCC)   Type 2 diabetes mellitus with hyperlipidemia (HCC)   History of CVA (cerebrovascular accident)   Obesity, diabetes, and hypertension syndrome (HCC)  Resolved Problems:   * No resolved hospital problems. [facility] Course: Richard [name] was admitted to the hospital with the working diagnosis of heart failure decompensation.   52/M with past medical history of heart failure, CKD 3B hypertension,T2DM, CVA, atrial fibrillation, OSA, and obesity class 3 who presented with worsening shortness of breath. He reported 3 days of progressive shortness of breath, lower extremity edema, and abdominal distention. Positive concerns about non compliance with medications at home. On his initial physical examination his blood pressure was 161/120, 179/130, HR 86, RR 36 and 02 saturation 97% on supplemental 02 per Ripley, lungs with decreased breath sounds, positive rales bilaterally, heart with S1 and S2 present and regular with no gallops or rubs, no murmurs, abdomen with no distention, positive lower extremity edema +++.   Na 138, K 3,2 Cl 106 bicarbonate 16 glucose 186, bun  25 cr 2,67 BNP 1,563  High sensitive troponin 48 and 69  Wbc 6.5 hgb 11,7 plt 182  Sars covid 19 negative.   Chest radiograph with cardiomegaly, with bilateral hilar vascular congestion and cephalization of the vasculature.   EKG 93 bpm, normal axis, qtc 432, normal intervals, sinus rhythm with sinus arrhythmia, and PVC, poor R R wave progression, with no significant ST segment or T wave changes.   Placed on furosemide for diuresis. Noted in low output heart failure.   10/02 picc line placed and patient started on Milrinone for low output heart failure and furosemide drip. 10/05 off milrinone and transitioned to oral loop diuretic therapy.  10/06 plan for right heart cath tomorrow. Patient requested Unna boots to be removed.  10/07 right heart catheterization mild elevation in filling pressures. Positive pulmonary hypertension.  10/08 patient continue to improve hemodynamics. Plan to continue medical therapy as outpatient and follow up with heart failure clinic.   Assessment and Plan: Acute on chronic systolic (congestive) heart failure (HCC) Echocardiogram with reduced LV systolic function 25%, global hypokinesis, LV cavity with moderate dilatation, RV systolic function with mild reduction, RVSP 45.9 mmHg. LA and RA with mild dilatation.   Acute on chronic core pulmonale. Pulmonary hypertension.   10/07 right heart catheterization. RV 65/10 PA 64/31 mean 43 PCWP mean 16  Cardiac output 7.2 and index 2,8  PVR 3,7 Woods.   Patient required milrinone and high dose diuretic therapy (sequential nephron blockade with acetazolamide, empagliflozin, furosemide and metolazone, spironolactone), resolving low out put state.   At the time of his discharge his volume status is negative, -30,243 ml, with significant improvement  3. Preserved cardiac output.   [place] EKG SITE RITE  Result Date: 03/06/2023 If Site Rite image not attached, placement could not be confirmed due to current cardiac rhythm.  ECHOCARDIOGRAM COMPLETE  Result Date: 03/05/2023    ECHOCARDIOGRAM REPORT   Patient Name:   Richard [name] Date of Exam: 03/05/2023 Medical Rec #:  [id number]     Height:       71.0 in Accession #:    [id number]    Weight:       324.7 lb Date of Birth:  [birth year]     BSA:          2.591 m Patient Age:    52 years      BP:           119/81 mmHg Patient Gender: M             HR:           79 bpm. Exam Location:  Inpatient Procedure: 2D Echo, Cardiac Doppler, Color Doppler and Intracardiac            Opacification Agent Indications:    CHF-Acute Systolic I50.21  History:        Patient has prior history of Echocardiogram examinations, most                 recent  11/09/2021. CHF; Risk Factors:Diabetes and Hypertension.  Sonographer:    Harriette Bouillon RDCS Referring Phys: [id number] Cecille Po MELVIN IMPRESSIONS  1. Left ventricular ejection fraction, by estimation, is 25%. The left ventricle has severely decreased function. The left ventricle demonstrates global hypokinesis. The left ventricular internal cavity size was moderately dilated. Left ventricular diastolic parameters are consistent with Grade II diastolic dysfunction (pseudonormalization).  2. Right ventricular systolic function is mildly reduced. The right ventricular size is normal. There is moderately elevated pulmonary artery systolic pressure. The estimated right ventricular systolic pressure is 45.9 mmHg.  3. Left atrial size was mildly dilated.  4. Right atrial size was mildly dilated.  5. The mitral valve is normal in structure. Trivial mitral valve regurgitation. No evidence of mitral stenosis.  6. The aortic valve is tricuspid. There is mild calcification of the aortic valve. Aortic valve regurgitation is not visualized. No aortic stenosis is present.  7. The inferior vena cava is dilated in size with <50% respiratory variability, suggesting right atrial pressure of 15 mmHg.  8. Technically difficult study with very poor images. FINDINGS  Left Ventricle: Left ventricular ejection fraction, by estimation, is 25%. The left ventricle has severely decreased function. The left ventricle demonstrates global hypokinesis. Definity contrast agent was given IV to delineate the left ventricular endocardial borders. The left ventricular internal cavity size was moderately dilated. There is no left ventricular hypertrophy. Left ventricular diastolic parameters are consistent with Grade II diastolic dysfunction (pseudonormalization). Right Ventricle: The right ventricular size is normal. Right vetricular wall thickness was not well visualized. Right ventricular systolic function is mildly reduced. There is moderately  elevated pulmonary artery systolic pressure. The tricuspid regurgitant velocity is 2.78 m/s, and with an assumed right atrial pressure of 15 mmHg, the estimated right ventricular systolic pressure is 45.9 mmHg. Left Atrium: Left atrial size was mildly dilated. Right Atrium: Right atrial size was mildly dilated. Pericardium: There is no evidence of pericardial effusion. Mitral Valve: The mitral valve is normal in structure. There is mild calcification of the mitral valve leaflet(s). Mild mitral annular calcification. Trivial mitral valve regurgitation. No evidence of mitral valve stenosis. Tricuspid Valve:  Physician Discharge Summary   Patient: Richard [name] MRN: [id number] DOB: 1971-01-01  Admit date:     03/04/2023  Discharge date: 03/12/23  Discharge Physician: York Ram Brecken Walth   PCP: Pcp, No   Recommendations at discharge:    Patient has been placed on heart failure guideline directed medical therapy with entresto, hydralazine/isosorbide, spironolactone, and empagliflozin. Resume carvedilol at the time of discharge at low dose.   Diuresis with torsemide 60 mg daily. Follow up renal function and electrolytes in 7 days. Follow up with primary care in 7 to 10 days.  Follow up with heart failure clinic as scheduled.    Discharge Diagnoses: Active Problems:   Acute on chronic systolic (congestive) heart failure (HCC)   Essential hypertension, benign   Paroxysmal atrial fibrillation (HCC)   Stage 3b chronic kidney disease (HCC)   Type 2 diabetes mellitus with hyperlipidemia (HCC)   History of CVA (cerebrovascular accident)   Obesity, diabetes, and hypertension syndrome (HCC)  Resolved Problems:   * No resolved hospital problems. [facility] Course: Richard [name] was admitted to the hospital with the working diagnosis of heart failure decompensation.   52/M with past medical history of heart failure, CKD 3B hypertension,T2DM, CVA, atrial fibrillation, OSA, and obesity class 3 who presented with worsening shortness of breath. He reported 3 days of progressive shortness of breath, lower extremity edema, and abdominal distention. Positive concerns about non compliance with medications at home. On his initial physical examination his blood pressure was 161/120, 179/130, HR 86, RR 36 and 02 saturation 97% on supplemental 02 per Ripley, lungs with decreased breath sounds, positive rales bilaterally, heart with S1 and S2 present and regular with no gallops or rubs, no murmurs, abdomen with no distention, positive lower extremity edema +++.   Na 138, K 3,2 Cl 106 bicarbonate 16 glucose 186, bun  25 cr 2,67 BNP 1,563  High sensitive troponin 48 and 69  Wbc 6.5 hgb 11,7 plt 182  Sars covid 19 negative.   Chest radiograph with cardiomegaly, with bilateral hilar vascular congestion and cephalization of the vasculature.   EKG 93 bpm, normal axis, qtc 432, normal intervals, sinus rhythm with sinus arrhythmia, and PVC, poor R R wave progression, with no significant ST segment or T wave changes.   Placed on furosemide for diuresis. Noted in low output heart failure.   10/02 picc line placed and patient started on Milrinone for low output heart failure and furosemide drip. 10/05 off milrinone and transitioned to oral loop diuretic therapy.  10/06 plan for right heart cath tomorrow. Patient requested Unna boots to be removed.  10/07 right heart catheterization mild elevation in filling pressures. Positive pulmonary hypertension.  10/08 patient continue to improve hemodynamics. Plan to continue medical therapy as outpatient and follow up with heart failure clinic.   Assessment and Plan: Acute on chronic systolic (congestive) heart failure (HCC) Echocardiogram with reduced LV systolic function 25%, global hypokinesis, LV cavity with moderate dilatation, RV systolic function with mild reduction, RVSP 45.9 mmHg. LA and RA with mild dilatation.   Acute on chronic core pulmonale. Pulmonary hypertension.   10/07 right heart catheterization. RV 65/10 PA 64/31 mean 43 PCWP mean 16  Cardiac output 7.2 and index 2,8  PVR 3,7 Woods.   Patient required milrinone and high dose diuretic therapy (sequential nephron blockade with acetazolamide, empagliflozin, furosemide and metolazone, spironolactone), resolving low out put state.   At the time of his discharge his volume status is negative, -30,243 ml, with significant improvement  Physician Discharge Summary   Patient: Richard [name] MRN: [id number] DOB: 1971-01-01  Admit date:     03/04/2023  Discharge date: 03/12/23  Discharge Physician: York Ram Brecken Walth   PCP: Pcp, No   Recommendations at discharge:    Patient has been placed on heart failure guideline directed medical therapy with entresto, hydralazine/isosorbide, spironolactone, and empagliflozin. Resume carvedilol at the time of discharge at low dose.   Diuresis with torsemide 60 mg daily. Follow up renal function and electrolytes in 7 days. Follow up with primary care in 7 to 10 days.  Follow up with heart failure clinic as scheduled.    Discharge Diagnoses: Active Problems:   Acute on chronic systolic (congestive) heart failure (HCC)   Essential hypertension, benign   Paroxysmal atrial fibrillation (HCC)   Stage 3b chronic kidney disease (HCC)   Type 2 diabetes mellitus with hyperlipidemia (HCC)   History of CVA (cerebrovascular accident)   Obesity, diabetes, and hypertension syndrome (HCC)  Resolved Problems:   * No resolved hospital problems. [facility] Course: Richard [name] was admitted to the hospital with the working diagnosis of heart failure decompensation.   52/M with past medical history of heart failure, CKD 3B hypertension,T2DM, CVA, atrial fibrillation, OSA, and obesity class 3 who presented with worsening shortness of breath. He reported 3 days of progressive shortness of breath, lower extremity edema, and abdominal distention. Positive concerns about non compliance with medications at home. On his initial physical examination his blood pressure was 161/120, 179/130, HR 86, RR 36 and 02 saturation 97% on supplemental 02 per R[place]y, lungs with decreased breath sounds, positive rales bilaterally, heart with S1 and S2 present and regular with no gallops or rubs, no murmurs, abdomen with no distention, positive lower extremity edema +++.   Na 138, K 3,2 Cl 106 bicarbonate 16 glucose 186, bun  25 cr 2,67 BNP 1,563  High sensitive troponin 48 and 69  Wbc 6.5 hgb 11,7 plt 182  Sars covid 19 negative.   Chest radiograph with cardiomegaly, with bilateral hilar vascular congestion and cephalization of the vasculature.   EKG 93 bpm, normal axis, qtc 432, normal intervals, sinus rhythm with sinus arrhythmia, and PVC, poor R R wave progression, with no significant ST segment or T wave changes.   Placed on furosemide for diuresis. Noted in low output heart failure.   10/02 picc line placed and patient started on Milrinone for low output heart failure and furosemide drip. 10/05 off milrinone and transitioned to oral loop diuretic therapy.  10/06 plan for right heart cath tomorrow. Patient requested Unna boots to be removed.  10/07 right heart catheterization mild elevation in filling pressures. Positive pulmonary hypertension.  10/08 patient continue to improve hemodynamics. Plan to continue medical therapy as outpatient and follow up with heart failure clinic.   Assessment and Plan: Acute on chronic systolic (congestive) heart failure (HCC) Echocardiogram with reduced LV systolic function 25%, global hypokinesis, LV cavity with moderate dilatation, RV systolic function with mild reduction, RVSP 45.9 mmHg. LA and RA with mild dilatation.   Acute on chronic core pulmonale. Pulmonary hypertension.   10/07 right heart catheterization. RV 65/10 PA 64/31 mean 43 PCWP mean 16  Cardiac output 7.2 and index 2,8  PVR 3,7 Woods.   Patient required milrinone and high dose diuretic therapy (sequential nephron blockade with acetazolamide, empagliflozin, furosemide and metolazone, spironolactone), resolving low out put state.   At the time of his discharge his volume status is negative, -30,243 ml, with significant improvement

## 2023-03-12 NOTE — Progress Notes (Signed)
PT Cancellation Note  Patient Details Name: Richard [name] MRN: [id number] DOB: Aug 18, 1970   Cancelled Treatment:    Reason Eval/Treat Not Completed: Medical issues which prohibited therapy - new onset afib, RN to assess prior to PT.   Marye Round, PT DPT Acute Rehabilitation Services Secure Chat Preferred  Office [phone]    Truddie Coco 03/12/2023, 10:34 AM

## 2023-03-12 NOTE — Progress Notes (Addendum)
Physical Therapy Treatment Patient Details Name: Richard [name] MRN: [id number] DOB: 05-08-1971 Today's Date: 03/12/2023   History of Present Illness Pt is a 52 y/o M presenting to ED on 9/30 with worsening SOB; admitted for working diagnosis of heart failure decompensation. PMH includes CKD IIIB, HTN, prediabetes, CVA, A fib, OSA, Obesity III    PT Comments  Pt eager to d/c home. Pt demonstrating improved walking ability this date, though continues to have wide BOS and antalgic gait secondary to bilat knee pain. Pt requesting a RW for home use "just in case" given bilat knee pain and decreased activity tolerance, pt also lives alone. Pt declines need to practice stairs, states he can stay on first floor of home if needed.   HR 90s-130 bpm with PVCs, RN and cardiology team aware     If plan is discharge home, recommend the following: A little help with walking and/or transfers;A little help with bathing/dressing/bathroom;Assistance with cooking/housework;Assist for transportation;Help with stairs or ramp for entrance   Can travel by private vehicle        Equipment Recommendations  Rolling walker (2 wheels)    Recommendations for Other Services       Precautions / Restrictions Precautions Precautions: Fall Precaution Comments: watch HR Restrictions Weight Bearing Restrictions: No     Mobility  Bed Mobility               General bed mobility comments: EOB upon arrival and departure    Transfers Overall transfer level: Needs assistance Equipment used: None Transfers: Sit to/from Stand Sit to Stand: Supervision           General transfer comment: for safety, slow to rise secondary to knee pain    Ambulation/Gait Ambulation/Gait assistance: Supervision, Contact guard assist Gait Distance (Feet): 80 Feet Assistive device: 1 person hand held assist, None Gait Pattern/deviations: Step-through pattern, Decreased stride length, Wide base of support, Drifts  right/left, Antalgic Gait velocity: decr     General Gait Details: for safety, PT intermittently providing contact guard in case of imbalance. HR elevation to 130 bpm, pt asymptomatic for CP, discomfort. Wide BOS and antalgic gait suspect secondary to LE swelling and pain   Stairs             Wheelchair Mobility     Tilt Bed    Modified Rankin (Stroke Patients Only)       Balance Overall balance assessment: Needs assistance Sitting-balance support: Feet supported Sitting balance-Leahy Scale: Good     Standing balance support: During functional activity, No upper extremity supported Standing balance-Leahy Scale: Fair                              Cognition Arousal: Alert Behavior During Therapy: WFL for tasks assessed/performed Overall Cognitive Status: Within Functional Limits for tasks assessed                                 General Comments: lacks some insight into current deficits vs PLOF        Exercises Other Exercises Other Exercises: PT reviewed LAQs and ankle pumps for reducing stiffness, reducing swelling,and maintaining ROM. PT also discussed importance of LE elevation when resting.    General Comments General comments (skin integrity, edema, etc.): HR 90s-130 bpm with PVCs, RN and MD aware  Home walking program: up and walking 1x/hour during waking hours for short  household distances with supervision of family if possible, to promote circulation, activity tolerance, and strength maintenance.       Pertinent Vitals/Pain Pain Assessment Pain Assessment: No/denies pain Faces Pain Scale: Hurts little more Pain Location: bilat knees Pain Descriptors / Indicators: Discomfort, Grimacing, Guarding Pain Intervention(s): Limited activity within patient's tolerance, Monitored during session, Repositioned    Home Living                          Prior Function            PT Goals (current goals can now be found  in the care plan section) Acute Rehab PT Goals Patient Stated Goal: to go home PT Goal Formulation: With patient Time For Goal Achievement: 03/24/23 Potential to Achieve Goals: Fair Progress towards PT goals: Progressing toward goals    Frequency    Min 1X/week      PT Plan      Co-evaluation              AM-PAC PT "6 Clicks" Mobility   Outcome Measure  Help needed turning from your back to your side while in a flat bed without using bedrails?: A Little Help needed moving from lying on your back to sitting on the side of a flat bed without using bedrails?: A Little Help needed moving to and from a bed to a chair (including a wheelchair)?: A Little Help needed standing up from a chair using your arms (e.g., wheelchair or bedside chair)?: A Little Help needed to walk in hospital room?: A Little Help needed climbing 3-5 steps with a railing? : A Little 6 Click Score: 18    End of Session   Activity Tolerance: Patient tolerated treatment well Patient left: with call bell/phone within reach;in bed;Other (comment) (sitting EOB) Nurse Communication: Mobility status PT Visit Diagnosis: Unsteadiness on feet (R26.81);Muscle weakness (generalized) (M62.81)     Time: 1500-1509 PT Time Calculation (min) (ACUTE ONLY): 9 min  Charges:    $Therapeutic Activity: 8-22 mins PT General Charges $$ ACUTE PT VISIT: 1 Visit                     Marye Round, PT DPT Acute Rehabilitation Services Secure Chat Preferred  Office [phone]    Richard [name] 03/12/2023, 4:07 PM

## 2023-03-12 NOTE — Plan of Care (Signed)
  Problem: Education: Goal: Ability to demonstrate management of disease process will improve Outcome: Completed/Met Goal: Ability to verbalize understanding of medication therapies will improve Outcome: Completed/Met   Problem: Activity: Goal: Capacity to carry out activities will improve Outcome: Completed/Met   Problem: Cardiac: Goal: Ability to achieve and maintain adequate cardiopulmonary perfusion will improve Outcome: Completed/Met   Problem: Education: Goal: Knowledge of General Education information will improve Description: Including pain rating scale, medication(s)/side effects and non-pharmacologic comfort measures Outcome: Completed/Met   Problem: Health Behavior/Discharge Planning: Goal: Ability to manage health-related needs will improve Outcome: Completed/Met   Problem: Clinical Measurements: Goal: Ability to maintain clinical measurements within normal limits will improve Outcome: Completed/Met Goal: Will remain free from infection Outcome: Completed/Met Goal: Diagnostic test results will improve Outcome: Completed/Met Goal: Respiratory complications will improve Outcome: Completed/Met Goal: Cardiovascular complication will be avoided Outcome: Completed/Met   Problem: Activity: Goal: Risk for activity intolerance will decrease Outcome: Completed/Met   Problem: Nutrition: Goal: Adequate nutrition will be maintained Outcome: Completed/Met   Problem: Coping: Goal: Level of anxiety will decrease Outcome: Completed/Met   Problem: Elimination: Goal: Will not experience complications related to bowel motility Outcome: Completed/Met Goal: Will not experience complications related to urinary retention Outcome: Completed/Met   Problem: Pain Managment: Goal: General experience of comfort will improve Outcome: Completed/Met   Problem: Safety: Goal: Ability to remain free from injury will improve Outcome: Completed/Met   Problem: Skin Integrity: Goal:  Risk for impaired skin integrity will decrease Outcome: Completed/Met   Problem: Coping: Goal: Ability to adjust to condition or change in health will improve Outcome: Completed/Met   Problem: Fluid Volume: Goal: Ability to maintain a balanced intake and output will improve Outcome: Completed/Met   Problem: Health Behavior/Discharge Planning: Goal: Ability to identify and utilize available resources and services will improve Outcome: Completed/Met Goal: Ability to manage health-related needs will improve Outcome: Completed/Met   Problem: Metabolic: Goal: Ability to maintain appropriate glucose levels will improve Outcome: Completed/Met   Problem: Nutritional: Goal: Maintenance of adequate nutrition will improve Outcome: Completed/Met Goal: Progress toward achieving an optimal weight will improve Outcome: Completed/Met   Problem: Skin Integrity: Goal: Risk for impaired skin integrity will decrease Outcome: Completed/Met   Problem: Tissue Perfusion: Goal: Adequacy of tissue perfusion will improve Outcome: Completed/Met

## 2023-03-12 NOTE — TOC Transition Note (Addendum)
Transition of Care [facility]) - CM/SW Discharge Note   Patient Details  Name: Richard [name] MRN: [id number] Date of Birth: March 17, 1971  [facility]) CM/SW Contact:  Lawerance Sabal, RN Phone Number: 03/12/2023, 2:16 PM   Clinical Narrative:     Meds filled by TOC through [facility]. Lillia Abed, pharmacist, provided samples of Eliquis and Entresto.  [facility] PT OT through [facility] has been set up and orders placed.  LOG for bari rollator approved by [facility] leadership, and will be filled and delivered to room by [employer].  Taxi voucher at bedside    Final next level of care: Home w Home Health Services Barriers to Discharge: No Barriers Identified   Patient Goals and CMS Choice      Discharge Placement                         Discharge Plan and Services Additional resources added to the After Visit Summary for     Discharge Planning Services: CM Consult                      HH Arranged: PT, OT [facility] Agency: [facility] Date [facility] Agency Contacted: 03/12/23 Time HH Agency Contacted: 1416 Representative spoke with at [facility] Agency: Amy  Social Determinants of Health (SDOH) Interventions SDOH Screenings   Food Insecurity: No Food Insecurity (03/04/2023)  Housing: Medium Risk (03/04/2023)  Transportation Needs: No Transportation Needs (03/04/2023)  Utilities: Not At Risk (03/04/2023)  Alcohol Screen: Low Risk  (11/13/2021)  Depression (PHQ2-9): Low Risk  (11/24/2021)  Financial Resource Strain: Low Risk  (11/13/2021)  Physical Activity: Sufficiently Active (11/24/2021)  Social Connections: Moderately Integrated (11/24/2021)  Stress: No Stress Concern Present (11/24/2021)  Tobacco Use: Low Risk  (03/04/2023)     Readmission Risk Interventions     No data to display

## 2023-03-12 NOTE — Progress Notes (Addendum)
Asked to review EKG for "new onset a fib", appears to be ST with PVCs, will have MD review as well. Patient asymptomatic.   QTc concerning on EKG at 609. Personally measured on live tele, got .48/.49.   Had been having frequent ectopy PACs/PVCs this morning. BMET has been ordered/collected but not yet resulted. Personally called main lab, "BMET in analyzer right now". Will follow up on results.   Discussed with PharmD, will replete electrolytes (Mg), not repleated yesterday (1.7).   Brynda Peon, AGACNP-BC  Advanced Heart Failure Team

## 2023-03-12 NOTE — TOC Progression Note (Signed)
Transition of Care [facility]) - Progression Note    Patient Details  Name: Richard [name] MRN: [id number] Date of Birth: May 25, 1971  [facility]) CM/SW Contact  Nicanor Bake Phone Number: [phone] 03/12/2023, 12:08 PM  Clinical Narrative:   HF CSW met with pt at bedside. Pt stated that he was concerned about medication at dc. CSW reached out to the pharmacy team to follow up with pt. Pt stated that was the only concerns that he has.   TOC will continue following.     Expected Discharge Plan: Home/Self Care Barriers to Discharge: Continued Medical Work up  Expected Discharge Plan and Services   Discharge Planning Services: CM Consult   Living arrangements for the past 2 months: Single Family Home                                       Social Determinants of Health (SDOH) Interventions SDOH Screenings   Food Insecurity: No Food Insecurity (03/04/2023)  Housing: Medium Risk (03/04/2023)  Transportation Needs: No Transportation Needs (03/04/2023)  Utilities: Not At Risk (03/04/2023)  Alcohol Screen: Low Risk  (11/13/2021)  Depression (PHQ2-9): Low Risk  (11/24/2021)  Financial Resource Strain: Low Risk  (11/13/2021)  Physical Activity: Sufficiently Active (11/24/2021)  Social Connections: Moderately Integrated (11/24/2021)  Stress: No Stress Concern Present (11/24/2021)  Tobacco Use: Low Risk  (03/04/2023)    Readmission Risk Interventions     No data to display

## 2023-03-12 NOTE — Progress Notes (Signed)
Occupational Therapy Treatment Patient Details Name: Richard [name] MRN: [id number] DOB: 1971/05/04 Today's Date: 03/12/2023   History of present illness Pt is a 52 y/o M presenting to ED on 9/30 with worsening SOB; admitted for working diagnosis of heart failure decompensation. PMH cindlues CKD IIIB, HTN, prediabetes, CVA, A fib, OSA, Obesity III   OT comments  Pt seen for OT session, emphasis on review of energy conservation strategies for home, provided pt with handout and reviewed. Pt verbalized understanding. 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 Pt presenting with impairments listed below, will follow acutely. Recommend HHOT at d/c.       If plan is discharge home, recommend the following:  A little help with walking and/or transfers;A little help with bathing/dressing/bathroom;Assistance with cooking/housework;Assist for transportation   Equipment Recommendations  Tub/shower bench    Recommendations for Other Services PT consult    Precautions / Restrictions Precautions Precaution Comments: watch HR Restrictions Weight Bearing Restrictions: No       Mobility Bed Mobility               General bed mobility comments: EOB upon arrival and departure    Transfers Overall transfer level: Needs assistance     Sit to Stand: Contact guard assist, Min assist           General transfer comment: able to stand for demo of exercise, declines further ambulation at this time     Balance Overall balance assessment: Needs assistance Sitting-balance support: Feet supported Sitting balance-Leahy Scale: Good     Standing balance support: During functional activity, Bilateral upper extremity supported Standing balance-Leahy Scale: Poor                             ADL either performed or assessed with clinical  judgement   ADL Overall ADL's : Needs assistance/impaired                     Lower Body Dressing: Minimal assistance Lower Body Dressing Details (indicate cue type and reason): simulated via functional mobility             Functional mobility during ADLs: Contact guard assist      Extremity/Trunk Assessment Upper Extremity Assessment Upper Extremity Assessment: Overall WFL for tasks assessed            Vision   Vision Assessment?: No apparent visual deficits   Perception Perception Perception: Not tested   Praxis Praxis Praxis: Not tested    Cognition Arousal: Alert Behavior During Therapy: WFL for tasks assessed/performed Overall Cognitive Status: Within Functional Limits for tasks assessed                                          Exercises Exercises: Other exercises Other Exercises Other Exercises: forward punches Other Exercises: sit to stands Other Exercises: chair pushups (verbally reviewed) Other Exercises: seated leg kicks Other Exercises: energy cons strategy handout    Shoulder Instructions       General Comments VSS    Pertinent Vitals/ Pain       Pain Assessment Pain Assessment: No/denies pain  Home Living  Prior Functioning/Environment              Frequency  Min 1X/week        Progress Toward Goals  OT Goals(current goals can now be found in the care plan section)  Progress towards OT goals: Progressing toward goals  Acute Rehab OT Goals Patient Stated Goal: none stated OT Goal Formulation: With patient Time For Goal Achievement: 03/24/23 Potential to Achieve Goals: Good ADL Goals Pt Will Perform Lower Body Dressing: Independently;sitting/lateral leans;sit to/from stand Pt Will Perform Tub/Shower Transfer: Tub transfer;Shower transfer;Independently;ambulating Additional ADL Goal #1: pt will verbalize x3 energy conservation strategies  in prep for ADLs  Plan      Co-evaluation                 AM-PAC OT "6 Clicks" Daily Activity     Outcome Measure   Help from another person eating meals?: None Help from another person taking care of personal grooming?: A Little Help from another person toileting, which includes using toliet, bedpan, or urinal?: A Little Help from another person bathing (including washing, rinsing, drying)?: A Little Help from another person to put on and taking off regular upper body clothing?: A Little Help from another person to put on and taking off regular lower body clothing?: A Little 6 Click Score: 19    End of Session    OT Visit Diagnosis: Unsteadiness on feet (R26.81);Other abnormalities of gait and mobility (R26.89);Muscle weakness (generalized) (M62.81)   Activity Tolerance Patient tolerated treatment well   Patient Left in bed;with call bell/phone within reach   Nurse Communication Mobility status        Time: 1333-1350 OT Time Calculation (min): 17 min  Charges: OT General Charges $OT Visit: 1 Visit OT Treatments $Self Care/Home Management : 8-22 mins  Carver Fila, OTD, OTR/L SecureChat Preferred Acute Rehab [phone]   Carver Fila Koonce 03/12/2023, 1:56 PM

## 2023-03-13 LAB — POCT I-STAT EG7
Acid-Base Excess: 0 mmol/L (ref 0.0–2.0)
Bicarbonate: 25.5 mmol/L (ref 20.0–28.0)
Calcium, Ion: 1.17 mmol/L (ref 1.15–1.40)
HCT: 39 % (ref 39.0–52.0)
Hemoglobin: 13.3 g/dL (ref 13.0–17.0)
O2 Saturation: 70 %
Potassium: 3.6 mmol/L (ref 3.5–5.1)
Sodium: 142 mmol/L (ref 135–145)
TCO2: 27 mmol/L (ref 22–32)
pCO2, Ven: 42.6 mm[Hg] — ABNORMAL LOW (ref 44–60)
pH, Ven: 7.386 (ref 7.25–7.43)
pO2, Ven: 37 mm[Hg] (ref 32–45)

## 2023-03-13 LAB — LIPOPROTEIN A (LPA): Lipoprotein (a): 108.5 nmol/L — ABNORMAL HIGH (ref <75.0)

## 2023-03-14 NOTE — Telephone Encounter (Signed)
Advanced Heart Failure Patient Advocate Encounter  Called and left the patient a message regarding POI requirement for [employer] assistance.

## 2023-03-21 ENCOUNTER — Encounter (HOSPITAL_COMMUNITY): Payer: Self-pay

## 2023-03-21 ENCOUNTER — Other Ambulatory Visit (HOSPITAL_COMMUNITY): Payer: Self-pay

## 2023-03-21 ENCOUNTER — Ambulatory Visit (HOSPITAL_COMMUNITY)
Admit: 2023-03-21 | Discharge: 2023-03-21 | Disposition: A | Discharge status: Home / Self Care | Payer: MEDICAID | Source: Ambulatory Visit | Attending: Cardiology | Admitting: Cardiology

## 2023-03-21 VITALS — BP 136/94 | HR 96 | Wt 307.0 lb

## 2023-03-21 DIAGNOSIS — E1122 Type 2 diabetes mellitus with diabetic chronic kidney disease: Secondary | ICD-10-CM | POA: Insufficient documentation

## 2023-03-21 DIAGNOSIS — I5023 Acute on chronic systolic (congestive) heart failure: Secondary | ICD-10-CM

## 2023-03-21 DIAGNOSIS — I5022 Chronic systolic (congestive) heart failure: Secondary | ICD-10-CM | POA: Insufficient documentation

## 2023-03-21 DIAGNOSIS — G4733 Obstructive sleep apnea (adult) (pediatric): Secondary | ICD-10-CM | POA: Insufficient documentation

## 2023-03-21 DIAGNOSIS — I428 Other cardiomyopathies: Secondary | ICD-10-CM | POA: Insufficient documentation

## 2023-03-21 DIAGNOSIS — I130 Hypertensive heart and chronic kidney disease with heart failure and stage 1 through stage 4 chronic kidney disease, or unspecified chronic kidney disease: Secondary | ICD-10-CM | POA: Insufficient documentation

## 2023-03-21 DIAGNOSIS — I4819 Other persistent atrial fibrillation: Secondary | ICD-10-CM | POA: Insufficient documentation

## 2023-03-21 DIAGNOSIS — N1832 Chronic kidney disease, stage 3b: Secondary | ICD-10-CM | POA: Insufficient documentation

## 2023-03-21 DIAGNOSIS — Z8673 Personal history of transient ischemic attack (TIA), and cerebral infarction without residual deficits: Secondary | ICD-10-CM | POA: Insufficient documentation

## 2023-03-21 LAB — COMPREHENSIVE METABOLIC PANEL
ALT: 23 U/L (ref 0–44)
AST: 23 U/L (ref 15–41)
Albumin: 3.4 g/dL — ABNORMAL LOW (ref 3.5–5.0)
Alkaline Phosphatase: 49 U/L (ref 38–126)
Anion gap: 13 (ref 5–15)
BUN: 22 mg/dL — ABNORMAL HIGH (ref 6–20)
CO2: 24 mmol/L (ref 22–32)
Calcium: 8.9 mg/dL (ref 8.9–10.3)
Chloride: 102 mmol/L (ref 98–111)
Creatinine, Ser: 2.3 mg/dL — ABNORMAL HIGH (ref 0.61–1.24)
GFR, Estimated: 33 mL/min — ABNORMAL LOW (ref >60)
Glucose, Bld: 168 mg/dL — ABNORMAL HIGH (ref 70–99)
Potassium: 3.5 mmol/L (ref 3.5–5.1)
Sodium: 139 mmol/L (ref 135–145)
Total Bilirubin: 0.7 mg/dL (ref 0.3–1.2)
Total Protein: 8.1 g/dL (ref 6.5–8.1)

## 2023-03-21 LAB — CBC
HCT: 37.7 % — ABNORMAL LOW (ref 39.0–52.0)
Hemoglobin: 11.7 g/dL — ABNORMAL LOW (ref 13.0–17.0)
MCH: 30 pg (ref 26.0–34.0)
MCHC: 31 g/dL (ref 30.0–36.0)
MCV: 96.7 fL (ref 80.0–100.0)
Platelets: 253 10*3/uL (ref 150–400)
RBC: 3.9 MIL/uL — ABNORMAL LOW (ref 4.22–5.81)
RDW: 14.2 % (ref 11.5–15.5)
WBC: 5.9 10*3/uL (ref 4.0–10.5)
nRBC: 0 % (ref 0.0–0.2)

## 2023-03-21 LAB — T4, FREE: Free T4: 1.56 ng/dL — ABNORMAL HIGH (ref 0.61–1.12)

## 2023-03-21 LAB — BRAIN NATRIURETIC PEPTIDE: B Natriuretic Peptide: 1091.8 pg/mL — ABNORMAL HIGH (ref 0.0–100.0)

## 2023-03-21 LAB — TSH: TSH: 1.474 u[IU]/mL (ref 0.350–4.500)

## 2023-03-21 MED ORDER — HYDRALAZINE HCL 100 MG PO TABS
100.0000 mg | ORAL_TABLET | Freq: Three times a day (TID) | ORAL | 3 refills | Status: DC
  Filled 2023-03-21: qty 270, 90d supply, fill #0
  Filled 2023-03-21: qty 90, 30d supply, fill #0
  Filled 2023-04-01 – 2023-04-05 (×2): qty 90, 30d supply, fill #1

## 2023-03-21 MED ORDER — HYDRALAZINE HCL 100 MG PO TABS
100.0000 mg | ORAL_TABLET | Freq: Three times a day (TID) | ORAL | 3 refills | Status: DC
  Filled 2023-03-21: qty 270, 90d supply, fill #0

## 2023-03-21 NOTE — Progress Notes (Signed)
ADVANCED HF CLINIC NOTE  Primary Care: Pcp, No HF Cardiologist: Dr. Shirlee Latch  HPI: Richard [name] is a 52 y.o.. male with a past medical history that includes type 2 diabetes, chronic combined systolic and diastolic heart failure, hypertension, chronic kidney disease stage IIla, H/o CVA, obesity and h/o poor compliance.   Echo 09/27/2015 EF 20-25%, RV mild dilated with moderately reduced systolic function, severe LAE.  Nonischemic cardiomyopathy, likely 2/2 poorly controlled HTN.  HIV negative, SPEP negative, no M spike. UPEP negative.  Had LHC in 2014, negative for CAD.  EF was 25-30% at that time. Improved on f/u echo in 2017. EF normalized 50-55%, GIIDD. He was being followed in the [facility] (Dr. Shirlee Latch) but lost to f/u.   In 2020, he was admitted w/ Cryptogenic stroke. 2D echo w/ normal EF, 50-55%, normal RV.  However huge discrepancy in EF by TEE. EF felt to be 20-25%, RV normal. No LV thrombus. EP was consulted for Implantable loop recorder for afib surveillance, however pt declined.    He established care w/ Dr. Izora Ribas 6/22. Seen given complaints of LEE. BP was elevated. GDMT adjusted. Losartan switched to Entresto, spironolactone added. He was ordered to get repeat echo w/ plans to start SLGTi at subsequent visit, but he never followed up.   Admitted 6/23 w/ a/c CHF in the setting of new atrial fibrillation w/ RVR. Echo EF back down to 25-30%, RV normal. HS trop 466>>349. LHC not perused given renal function, SCr 2.5 on admit. He was diuresed w/ IV Lasix. Placed on Eliquis for Afib. TEE LVEF 15-20%, Aortic valve did not open with each ventricular contraction, RV systolic function severely reduced, Moderate tricuspid regurgitation, Trivial mitral regurgitation, No LA/LAA thrombus or masses. Underwent DCCV x 1 back to NSR.    Unfortunately he had ERAF. EP consulted and recommended loading w/ IV amio and reattempt DCCV after load. However pt refused to stay and opted on repeat cardioversion  as an outpatient. He was transitioned to PO amiodarone 400 mg bid x 1 week then 200 mg daily thereafter. Eliquis continued. Transitioned to PO lasix + GDMT (losartan, spiro, Farxiga and coreg). SCr 2.1 day of d/c. Repeat DCCV scheduled 11/30/21. D/c wt 314 lb.    Had outpatient f/u w/ EP on 11/21/21. Was still in Afib, 110 bpm. Amiodarone reduced to 200 mg daily w/ plans to c/w outpatient DCCV. Labs showed SCr up to 2.78. BUN 24. K 4.5. CO2 21. He was instructed to hold Lasix and spiro x 1 day and increase PO intake.    Seen in [facility] 6/23, NYHA II symptoms but volume up and SCr up to 2.8. Lasix stopped, torsemide 40 mg daily started. Cleda Daub stopped with worsening renal function and low-dose BiDil added for afterload reduction. He was referred back to the [facility].   S/p DCCV 11/30/21 to NSR.  Recently admitted 9/24 w/ a/c CHF in the setting of med noncompliance and dietary indiscretion w/ sodium. He was markedly volume overloaded and diuresed w/ IV Lasix.  Echo showed EF 25%, RV mildly reduced. PICC placed and found to have low output HF by co-ox, started on milrinone and later titrated off.  RHC 10/7 showed mildly elevated PCWP at 16 with moderate mixed PAH/PVH, stable CI off milrinone. Cardiac MRI showed LV EF 30%, RV EF 27%, LGE in a pattern that could be consistent with prior myocarditis versus possible cardiac sarcoidosis. ECV percentage was also elevated at 37%, suggesting increased myocardial fibrotic content. ACE level normal. Outpatient cardiac PET  was recommended for f/u. He was transitioned to PO torsemide and GDMT titrated. Discharge wt 304 lb.   He presents today for post hospital f/u. Reports doing well. Breathing much improved. Denies resting dyspnea, no orthopnea or PND. Exercise tolerance much improved, now NYHA Class II. Checking wt daily at home and has been running around 299 lb. Clinic wt today is 307 lb but wearing steel-toed boots. He reports full med compliance and tolerating well w/o side  effects. BP today is 136/94. No abnormal bleeding w/ Eliquis.    ECG (personally reviewed): NSR 73 bpm   Labs (6/23): K 3.7, creatinine 2.69, hgb 14.5 Labs (10/23): K 3,7, creatinine 2.13, hgb 11.1   Cardiac Testing  - Echo (6/23): EF 25-30%, severe LV dysfunction with global HK, normal RV - Echo (10/24): EF 25%, RV mildly reduced   - RHC (10/24)  Right Heart Pressures RHC Procedural Findings: Hemodynamics (mmHg) RA mean 8 RV 65/10 PA 64/31, mean 43 PCWP mean 16  Oxygen saturations: PA 70% AO 97%  Cardiac Output (Fick) 7.28  Cardiac Index (Fick) 2.85 PVR 3.7 WU  PAPi 4.1    CMRI 10/24 MPRESSION: 1. Mildly dilated LV with mild concentric LV hypertrophy. LV EF 30%, diffuse hypokinesis.   2.  Normal RV size with EF 27%.   3. Non-coronary pattern mid-wall LGE in the basal to mid septum, basal inferior wall, and basal inferior RV wall. Consider infiltrative disease such as cardiac sarcoidosis or prior myocarditis.  Extracellular volume percentage was elevated at 37%, suggesting  increased myocardial fibrotic content.    ROS: All systems reviewed and negative except as per HPI.   Past Medical History:  Diagnosis Date   Acute decompensated heart failure (HCC) 01/03/2013   Allergy    CHF (congestive heart failure) (HCC)    systolic   Chronic kidney disease    CKD (chronic kidney disease) stage 2, GFR 60-89 ml/min 01/04/2013   DM (diabetes mellitus) (HCC) 01/06/2013   Hypertension    hx htn emergency   Obesity    Other secondary pulmonary hypertension (HCC) 12/19/2021   Stroke [facility])    Current Outpatient Medications  Medication Sig Dispense Refill   acetaminophen (TYLENOL) 500 MG tablet Take 1,000 mg by mouth every 6 (six) hours as needed for moderate pain or headache.     amiodarone (PACERONE) 200 MG tablet Take 1 tablet (200 mg total) by mouth daily. 30 tablet 0   apixaban (ELIQUIS) 5 MG TABS tablet Take 1 tablet (5 mg total) by mouth 2 (two) times daily. 60  tablet 0   atorvastatin (LIPITOR) 40 MG tablet Take 1 tablet (40 mg total) by mouth daily. 30 tablet 0   carvedilol (COREG) 12.5 MG tablet Take 1 tablet (12.5 mg total) by mouth 2 (two) times daily with a meal. 60 tablet 0   dapagliflozin propanediol (FARXIGA) 10 MG TABS tablet Take 1 tablet (10 mg total) by mouth daily. 30 tablet 0   hydrALAZINE (APRESOLINE) 25 MG tablet Take 3 tablets (75 mg total) by mouth 3 (three) times daily. 270 tablet 0   isosorbide mononitrate (IMDUR) 60 MG 24 hr tablet Take 1 tablet (60 mg total) by mouth daily. 30 tablet 0   sacubitril-valsartan (ENTRESTO) 24-26 MG Take 1 tablet by mouth 2 (two) times daily. 60 tablet 0   spironolactone (ALDACTONE) 25 MG tablet Take 1 tablet (25 mg total) by mouth daily. 30 tablet 0   torsemide (DEMADEX) 20 MG tablet Take 3 tablets (60 mg total) by  mouth daily. 90 tablet 0   No current facility-administered medications for this visit.   No Known Allergies  Social History   Socioeconomic History   Marital status: Single    Spouse name: Not on file   Number of children: 0   Years of education: Not on file   Highest education level: Bachelor's degree (e.g., BA, AB, BS)  Occupational History   Occupation: English as a second language teacher  Tobacco Use   Smoking status: Never   Smokeless tobacco: Never  Vaping Use   Vaping status: Never Used  Substance and Sexual Activity   Alcohol use: Not Currently   Drug use: No   Sexual activity: Not on file  Other Topics Concern   Not on file  Social History Narrative   Work or School: Technical sales engineer, Investment banker, corporate      Home Situation: lives alone      Spiritual Beliefs: Christian      Lifestyle: 15 minutes dialy walking - working up; working on diet            Social Determinants of Corporate investment banker Strain: Low Risk  (11/13/2021)   Overall Financial Resource Strain (CARDIA)    Difficulty of Paying Living Expenses: Not very hard  Food Insecurity: No  Food Insecurity (03/04/2023)   Hunger Vital Sign    Worried About Running Out of Food in the Last Year: Never true    Ran Out of Food in the Last Year: Never true  Transportation Needs: No Transportation Needs (03/04/2023)   PRAPARE - Administrator, Civil Service (Medical): No    Lack of Transportation (Non-Medical): No  Physical Activity: Sufficiently Active (11/24/2021)   Exercise Vital Sign    Days of Exercise per Week: 7 days    Minutes of Exercise per Session: 30 min  Stress: No Stress Concern Present (11/24/2021)   [employer] of Occupational Health - Occupational Stress Questionnaire    Feeling of Stress : Only a little  Social Connections: Moderately Integrated (11/24/2021)   Social Connection and Isolation Panel [NHANES]    Frequency of Communication with Friends and Family: More than three times a week    Frequency of Social Gatherings with Friends and Family: Three times a week    Attends Religious Services: More than 4 times per year    Active Member of Clubs or Organizations: Yes    Attends Banker Meetings: More than 4 times per year    Marital Status: Never married  Intimate Partner Violence: Not At Risk (03/04/2023)   Humiliation, Afraid, Rape, and Kick questionnaire    Fear of Current or Ex-Partner: No    Emotionally Abused: No    Physically Abused: No    Sexually Abused: No   Family History  Problem Relation Age of Onset   Heart disease Father 20       MI   Hypertension Father    Hyperlipidemia Father    Diabetes Mother    Hypertension Mother    There were no vitals taken for this visit.  Wt Readings from Last 3 Encounters:  03/12/23 (!) 138.5 kg (305 lb 4.8 oz)  09/04/22 (!) 147.3 kg (324 lb 12.8 oz)  01/07/22 (!) 137.9 kg (304 lb)   PHYSICAL EXAM: General:  Well appearing No respiratory difficulty HEENT: normal Neck: supple. no JVD. Carotids 2+ bilat; no bruits. No lymphadenopathy or thyromegaly appreciated. Cor: PMI  nondisplaced. Regular rate & rhythm. No rubs, gallops  or murmurs. Lungs: clear Abdomen: soft, nontender, nondistended. No hepatosplenomegaly. No bruits or masses. Good bowel sounds. Extremities: no cyanosis, clubbing, rash, edema Neuro: alert & oriented x 3, cranial nerves grossly intact. moves all 4 extremities w/o difficulty. Affect pleasant.   ASSESSMENT & PLAN: 1. Chronic Systolic Heart Failure - NICM, likely 2/2 poorly controlled HTN + tachymediated from rapid Afib . - Echo (2014): EF 25-30%, LHC no CAD  - Echo (4/17): EF 20-25%, RV mild dilated with moderately reduced systolic function, severe LAE - SPEP negative, no M spike. UPEP negative in 2017  - Echo (10/17): EF normalized, 50-55% - Echo (12/20): EF 50-55% - Echo (6/23): EF 25-30%, global HK, RV normal. In setting of new Afib w/ RVR, uncontrolled hypertension and poor compliance w/ meds  - Echo (10/24): EF 25%, RV mildly reduced - Admitted 10/24 for a/c CHF in setting of dietary indiscretion and poor med compliance. Co-ox c/w low output, required inotropic support w/ milrinone to help w/ diuresis  - RHC 10/24 after diuresis and weaning off of milrinone showed mildly elevated PCWP at 16 with moderate mixed PAH/PVH, stable CI off milrinone>>transitioned back to oral diuretics and GDMT - cMRI 10/24 showed LV EF 30%, RV EF 27%, LGE in a pattern that could be consistent with prior myocarditis versus possible cardiac sarcoidosis. ECV percentage was also elevated at 37%, suggesting increased myocardial fibrotic content. ACE level normal. No h/o lung disease and no FH of sarcoidosis - Needs Cardiac PET for further assessment to r/o sarcoidosis, will order - NYHA Class II  - Volume stable post discharge.  Euvolemic on exam  - Continue Farxiga 10 mg  - Continue Entresto 24-26 mg bid.  - Continue Spironolactone 25 mg  - Continue Imdur 60 mg  - Increase Hydralazine to 100 mg tid  - Continue Coreg 12.5 mg bid  - Continue Torsemide 60 mg  daily  - No digoxin w/ CKD. - Not current candidate for LHC given CKD w/ b/l SCr >2 - Check CMP and BNP today    2. Persistent Atrial Fibrillation  - Diagnosed 6/23 - s/p TEE/DCCV to NSR w/ ERAF - s/p DCCV 11/30/21 to NSR. - NSR on ECG today. - Continue amiodarone 200 mg daily. Check HFTs, TFTs. Needs annual eye exams   - Continue Eliquis 5 mg bid. No bleeding issues. Check CBC  - Continue beta blocker.   - Followed in EP Clinic. Needs to be considered for Afib ablation. LA diameter 4.40 cm. Will place referral  - Has Underlying OSA. Recent + sleep study 8/24. Will need treatment w/ CPAP    3. Hypertension  - controlled on current regimen - GDMT titration per above    4. CKD Stage IIIb - IV - Suspect hypertensive/ DM nephropathy  - Baseline SCr 2.1-2.5   - Continue Farxiga 10 mg daily.  - Check BMP today  - He has been referred to [facility].    5. Type 2 DM  - Recent Hgb A1c 6.4.  - Continue SGLT2i.   6. H/o CVA  - Cryptogenetic (2020). Refused loop recorder. - ? If cardioembolic, given recent afib detection. - Now on Eliquis 5 mg bid    7. OSA - + sleep study 8/24 - needs treatment w/ CPAP, refer to sleep clinic    F/u w/ PharmD in 3 wks for med titration and w/ MD or APP in 6 wks    Robbie Lis, PA-C  03/21/23

## 2023-03-21 NOTE — Patient Instructions (Signed)
Labs done today. We will contact you only if your labs are abnormal.  No medication changes were made. Please continue all current medications as prescribed.  You have been referred to Cardiac electrophysiology. They will contact you to schedule an appointment.   Your provider has recommended you have a Cardiac PET Scan at [facility]. We will get this approved with your insurance company and get it scheduled for you. We will call you with the date and time and instructions.   Your physician recommends that you schedule a follow-up appointment in: 3 weeks with our Clinic Pharmacist and in 6 weeks with Dr. Shirlee Latch.   If you have any questions or concerns before your next appointment please send [place] a message through [place] or call our office at [phone].    TO LEAVE A MESSAGE FOR THE NURSE SELECT OPTION 2, PLEASE LEAVE A MESSAGE INCLUDING: YOUR NAME DATE OF BIRTH CALL BACK NUMBER REASON FOR CALL**this is important as we prioritize the call backs  YOU WILL RECEIVE A CALL BACK THE SAME DAY AS LONG AS YOU CALL BEFORE 4:00 PM   Do the following things EVERYDAY: Weigh yourself in the morning before breakfast. Write it down and keep it in a log. Take your medicines as prescribed Eat low salt foods--Limit salt (sodium) to 2000 mg per day.  Stay as active as you can everyday Limit all fluids for the day to less than 2 liters   At the [facility], you and your health needs are our priority. As part of our continuing mission to provide you with exceptional heart care, we have created designated Provider Care Teams. These Care Teams include your primary Cardiologist (physician) and Advanced Practice Providers (APPs- Physician Assistants and Nurse Practitioners) who all work together to provide you with the care you need, when you need it.   You may see any of the following providers on your designated Care Team at your next follow up: Dr Arvilla Meres Dr Marca Ancona Dr. Marcos Eke, NP Robbie Lis, [place] [facility] [place], [place] Brynda Peon, NP Karle Plumber, PharmD   Please be sure to bring in all your medications bottles to every appointment.    Thank you for choosing [facility]

## 2023-03-22 LAB — T3, FREE: T3, Free: 2.6 pg/mL (ref 2.0–4.4)

## 2023-03-29 NOTE — Progress Notes (Addendum)
Advanced Heart Failure Clinic Note   Primary Care: Pcp, No HF Cardiologist: Dr. Shirlee Latch  HPI:  Richard [name] is a 52 y.o.. male with a past medical history that includes type 2 diabetes, chronic combined systolic and diastolic heart failure, hypertension, chronic kidney disease stage IIla, H/o CVA, obesity and h/o poor compliance.   Echo 09/27/2015 EF 20-25%, RV mild dilated with moderately reduced systolic function, severe LAE.  Nonischemic cardiomyopathy, likely 2/2 poorly controlled HTN.  HIV negative, SPEP negative, no M spike. UPEP negative.  Had LHC in 2014, negative for CAD.  EF was 25-30% at that time. Improved on f/u echo in 2017. EF normalized 50-55%, GIIDD. He was being followed in the [facility] (Dr. Shirlee Latch) but lost to f/u.   In 2020, he was admitted w/ Cryptogenic stroke. 2D echo w/ normal EF, 50-55%, normal RV.  However large discrepancy in EF by TEE. EF felt to be 20-25%, RV normal. No LV thrombus. EP was consulted for Implantable loop recorder for afib surveillance, however pt declined.    He established care w/ Dr. Izora Ribas 11/2020. Seen given complaints of LEE. BP was elevated. GDMT adjusted. Losartan switched to Entresto, spironolactone added. He was ordered to get repeat echo w/ plans to start SGLT2i at subsequent visit, but he never followed up.   Admitted 11/2021 w/ a/c CHF in the setting of new atrial fibrillation w/ RVR. Echo EF back down to 25-30%, RV normal. HS trop 466>>349. LHC not persued given renal function, SCr 2.5 on admit. He was diuresed w/ IV Lasix. Placed on Eliquis for Afib. TEE LVEF 15-20%, Aortic valve did not open with each ventricular contraction, RV systolic function severely reduced, Moderate tricuspid regurgitation, Trivial mitral regurgitation, No LA/LAA thrombus or masses. Underwent DCCV x 1 back to NSR.    Unfortunately he had ERAF. EP consulted and recommended loading w/ IV amio and reattempt DCCV after load. However pt refused to stay and opted on  repeat cardioversion as an outpatient. He was transitioned to PO amiodarone 400 mg bid x 1 week then 200 mg daily thereafter. Eliquis continued. Transitioned to PO Lasix + GDMT (losartan, spironolactone, Farxiga and carvedilol). SCr 2.1 day of d/c. Repeat DCCV scheduled 11/30/21. D/c wt 314 lb.    Had outpatient f/u w/ EP on 11/2021. Was still in Afib, 110 bpm. Amiodarone reduced to 200 mg daily w/ plans to c/w outpatient DCCV. Labs showed SCr up to 2.78. BUN 24. K 4.5. CO2 21. He was instructed to hold Lasix and spironolactone x 1 day and increase PO intake.    Seen in [facility] 11/2021, NYHA II symptoms but volume up and SCr up to 2.8. Lasix stopped, torsemide 40 mg daily started. Spironolactone stopped with worsening renal function and low-dose BiDil added for afterload reduction. He was referred back to the [facility].    S/p DCCV 11/30/21 to NSR.   Recently admitted 02/2023 w/ a/c CHF in the setting of med noncompliance and dietary indiscretion w/ sodium. He was markedly volume overloaded and diuresed w/ IV Lasix.  Echo showed EF 25%, RV mildly reduced. PICC placed and found to have low output HF by co-ox, started on milrinone and later titrated off.  RHC 03/11/23 showed mildly elevated PCWP at 16 with moderate mixed PAH/PVH, stable CI off milrinone. Cardiac MRI showed LV EF 30%, RV EF 27%, LGE in a pattern that could be consistent with prior myocarditis versus possible cardiac sarcoidosis. ECV percentage was also elevated at 37%, suggesting increased myocardial fibrotic  content. ACE level normal. Outpatient cardiac PET was recommended for f/u. He was transitioned to PO torsemide and GDMT titrated. Discharge wt 304 lbs.    He presented to AHF Clinic for post hospital f/u 03/21/23. Reported doing well. Breathing was much improved. Denied resting dyspnea, no orthopnea or PND. Exercise tolerance was improved, now NYHA Class II. Had been checking weight daily at home and had been running around 299 lbs. Clinic weight was  307 lbs but wearing steel-toed boots. He reported full med compliance and tolerating well w/o side effects. BP in clinic was 136/94. No abnormal bleeding w/ Eliquis.     Today he returns to HF clinic for pharmacist medication titration. At last visit with APP, hydralazine was increased to 100 mg TID. Overall feeling well today. Main complaint is he ran out of most of his medications this morning and is having difficulties affording them as he is uninsured. He ran out of Eliquis on Monday. No dizziness or lightheadedness. No CP or palpitations. Notes his breathing has improved. Activity level has been limited by knee pain but he has been doing push ups and other exercises. Weight at home has been stable at 294 lbs. Weight is 309 lbs in clinic, but he is wearing steel toed-boots. He takes torsemide 60 mg daily and has not needed any extra. No LEE, PND or orthopnea.   He is uninsured and over the income limit for most manufacturer patient assistance programs (PAP) to get medications for free. He is also over the limit for [place] Medicaid. [employer] PAP for Sherryll Burger is pending, but he is over the income limit to obtain [place] and Eliquis.     HF Medications: Carvedilol 12.5 mg BID  Entresto 24-26 mg BID  Spironolactone 25 mg daily Farxiga 10 mg daily Imdur 60 mg daily Hydralazine 100 mg TID  Torsemide 60 mg daily   Has the patient been experiencing any side effects to the medications prescribed?  no  Does the patient have any problems obtaining medications due to transportation or finances?   Yes. Uninsured and over the income limit for most manufacturer PAP programs. Able to meet income criteria for Entresto and PAP application is pending POI, will submit today. Stop Eliquis as he is over the income limit to receive assistance. Will start Xarelto. Although over the income limit for Xarelto PAP, Xarelto used to be approved for use under the HF Fund. Will send to [facility] to see if  they can process (also provided 30 day free card). If not able to get through HF fund, he will need to be changed to warfarin. Can make change at APP visit in 3 weeks if necessary. Stop Marcelline Deist as he is over the income limit to receive assistance. Also over the income limit for Jardiance PAP. He should qualify for Inpefa PAP. Will stop [place] and Start Inpefa 200 mg daily. Provided samples in clinic today. He is aware he must call the Inpefa Together program to start the process for PAP.  -Will do other HF medications through HF Fund   Understanding of regimen: good Understanding of indications: good Potential of compliance: good Patient understands to avoid NSAIDs. Patient understands to avoid decongestants.    Pertinent Lab Values: 03/21/23: Serum creatinine 2.30, BUN 22, Potassium 3.5, Sodium 139, BNP 1,091.8  Vital Signs: Weight: 309 lbs (last clinic weight: 307 lbs) Blood pressure: 120/82  Heart rate: 77   Assessment/Plan: 1. Chronic Systolic Heart Failure - NICM, likely 2/2 poorly controlled HTN +  tachymediated from rapid Afib . - Echo (2014): EF 25-30%, LHC no CAD  - Echo (09/2015): EF 20-25%, RV mild dilated with moderately reduced systolic function, severe LAE - SPEP negative, no M spike. UPEP negative in 2017  - Echo (03/2016): EF normalized, 50-55% - Echo (05/2019): EF 50-55% - Echo (11/2021): EF 25-30%, global HK, RV normal. In setting of new Afib w/ RVR, uncontrolled hypertension and poor compliance w/ meds  - Echo (03/2023): EF 25%, RV mildly reduced - Admitted 03/2023 for a/c CHF in setting of dietary indiscretion and poor med compliance. Co-ox c/w low output, required inotropic support w/ milrinone to help w/ diuresis  - RHC 03/2023 after diuresis and weaning off of milrinone showed mildly elevated PCWP at 16 with moderate mixed PAH/PVH, stable CI off milrinone>>transitioned back to oral diuretics and GDMT - cMRI 03/2023 showed LVEF 30%, RV EF 27%, LGE in a pattern that  could be consistent with prior myocarditis versus possible cardiac sarcoidosis. ECV percentage was also elevated at 37%, suggesting increased myocardial fibrotic content. ACE level normal. No h/o lung disease and no FH of sarcoidosis - Previously referred for Cardiac PET for further assessment to r/o sarcoidosis - NYHA Class II. Volume stable. - Continue Torsemide 60 mg daily  - Continue carvedilol 12.5 mg BID  - Continue Entresto 24-26 mg BID. [employer] PAP is pending POI. He provided his W2 to me today, will fax in. He has enough Entresto for 4 weeks. He returns to clinic in 3 weeks, can give him samples if PAP not approved by that time.  - Continue Spironolactone 25 mg daily - Stop Marcelline Deist as he is over the income limit to receive assistance. Also over the income limit for Jardiance PAP. He should qualify for Inpefa PAP. Will stop [place] and Start Inpefa 200 mg daily. Provided samples in clinic today. He is aware he must call the Inpefa Together program to start the process for PAP.  - Continue Imdur 60 mg daily - Continue Hydralazine 100 mg TID  - Other HF medications sent to [facility]. Will use HF fund.  - No digoxin w/ CKD. - Not current candidate for LHC given CKD w/ b/l SCr >2   2. Persistent Atrial Fibrillation  - Diagnosed 11/2021 - s/p TEE/DCCV to NSR w/ ERAF - s/p DCCV 11/30/21 to NSR. - NSR on ECG 03/21/23. - Continue amiodarone 200 mg daily.  - Stop Eliquis as he is over the income limit to receive assistance. Will start Xarelto 20 mg daily (CrCL using TBW is 74 mL/min). Although over the income limit for Xarelto PAP, Xarelto used to be approved for use under the HF Fund. Will send to [facility] to see if they can process (also provided 30 day free card). If not able to get through HF fund, he will need to be changed to warfarin. Can make change at APP visit in 3 weeks if necessary.  - Continue beta blocker.   - Followed in EP Clinic. Needs  to be considered for Afib ablation. LA diameter 4.40 cm.  - Has Underlying OSA. Recent + sleep study 01/2023. Will need treatment w/ CPAP    3. Hypertension  - controlled on current regimen - GDMT titration per above    4. CKD Stage IIIb - IV - Suspect hypertensive/ DM nephropathy  - Baseline SCr 2.1-2.5   - Change Farxiga to Inpefa due to cost concerns (see above) - He has been referred to [facility].  5. Type 2 DM  - Recent Hgb A1c 6.4.  - Change Farxiga to Inpefa due to cost concerns (see above)   6. H/o CVA  - Cryptogenetic (2020). Refused loop recorder. - ? If cardioembolic, given recent afib detection. - Change Eliquis to Xarelto due to cost concerns (see above)   7. OSA - + sleep study 01/2023 - needs treatment w/ CPAP, refer to sleep clinic   Follow up 3 weeks at APP Clinic. Will need to assess whether able to obtain Xarelto affordably. If not, will need referral to Coumadin Clinic.    Karle Plumber, PharmD, BCPS, BCCP, CPP Heart Failure Clinic Pharmacist [phone]

## 2023-04-01 ENCOUNTER — Other Ambulatory Visit (HOSPITAL_COMMUNITY): Payer: Self-pay

## 2023-04-01 ENCOUNTER — Other Ambulatory Visit (HOSPITAL_BASED_OUTPATIENT_CLINIC_OR_DEPARTMENT_OTHER): Payer: Self-pay

## 2023-04-02 ENCOUNTER — Inpatient Hospital Stay: Payer: Self-pay | Admitting: Nurse Practitioner

## 2023-04-02 ENCOUNTER — Other Ambulatory Visit (HOSPITAL_COMMUNITY): Payer: Self-pay

## 2023-04-05 ENCOUNTER — Other Ambulatory Visit (HOSPITAL_COMMUNITY): Payer: Self-pay

## 2023-04-05 ENCOUNTER — Other Ambulatory Visit: Payer: Self-pay

## 2023-04-08 ENCOUNTER — Other Ambulatory Visit (HOSPITAL_BASED_OUTPATIENT_CLINIC_OR_DEPARTMENT_OTHER): Payer: Self-pay

## 2023-04-08 ENCOUNTER — Other Ambulatory Visit (HOSPITAL_COMMUNITY): Payer: Self-pay

## 2023-04-11 ENCOUNTER — Ambulatory Visit (HOSPITAL_COMMUNITY)
Admission: RE | Admit: 2023-04-11 | Discharge: 2023-04-11 | Disposition: A | Discharge status: Home / Self Care | Payer: MEDICAID | Source: Ambulatory Visit | Attending: Cardiology | Admitting: Cardiology

## 2023-04-11 ENCOUNTER — Other Ambulatory Visit (HOSPITAL_COMMUNITY): Payer: Self-pay

## 2023-04-11 VITALS — BP 120/82 | HR 77 | Wt 309.0 lb

## 2023-04-11 DIAGNOSIS — I5022 Chronic systolic (congestive) heart failure: Secondary | ICD-10-CM | POA: Insufficient documentation

## 2023-04-11 DIAGNOSIS — I639 Cerebral infarction, unspecified: Secondary | ICD-10-CM

## 2023-04-11 DIAGNOSIS — I5023 Acute on chronic systolic (congestive) heart failure: Secondary | ICD-10-CM

## 2023-04-11 DIAGNOSIS — N1832 Chronic kidney disease, stage 3b: Secondary | ICD-10-CM | POA: Insufficient documentation

## 2023-04-11 DIAGNOSIS — I130 Hypertensive heart and chronic kidney disease with heart failure and stage 1 through stage 4 chronic kidney disease, or unspecified chronic kidney disease: Secondary | ICD-10-CM | POA: Insufficient documentation

## 2023-04-11 DIAGNOSIS — I4819 Other persistent atrial fibrillation: Secondary | ICD-10-CM | POA: Insufficient documentation

## 2023-04-11 DIAGNOSIS — E1122 Type 2 diabetes mellitus with diabetic chronic kidney disease: Secondary | ICD-10-CM | POA: Insufficient documentation

## 2023-04-11 MED ORDER — ISOSORBIDE MONONITRATE ER 60 MG PO TB24
60.0000 mg | ORAL_TABLET | Freq: Every day | ORAL | 0 refills | Status: DC
  Filled 2023-04-11 (×2): qty 15, 15d supply, fill #0
  Filled 2023-04-11: qty 30, 30d supply, fill #0

## 2023-04-11 MED ORDER — CARVEDILOL 12.5 MG PO TABS
12.5000 mg | ORAL_TABLET | Freq: Two times a day (BID) | ORAL | 5 refills | Status: DC
  Filled 2023-04-11: qty 60, 30d supply, fill #0
  Filled 2023-05-06: qty 60, 30d supply, fill #1
  Filled 2023-06-06: qty 60, 30d supply, fill #2
  Filled 2023-07-01 – 2023-07-02 (×3): qty 60, 30d supply, fill #3
  Filled 2023-08-01: qty 60, 30d supply, fill #4
  Filled 2023-09-15: qty 60, 30d supply, fill #5

## 2023-04-11 MED ORDER — INPEFA 200 MG PO TABS
1.0000 | ORAL_TABLET | Freq: Every day | ORAL | 5 refills | Status: DC
Start: 2023-04-11 — End: 2024-03-30
  Filled 2023-04-11: qty 30, fill #0
  Filled 2023-05-06 – 2023-05-15 (×7): qty 30, 30d supply, fill #0
  Filled 2023-06-06 – 2023-07-05 (×4): qty 30, 30d supply, fill #1

## 2023-04-11 MED ORDER — HYDRALAZINE HCL 100 MG PO TABS
100.0000 mg | ORAL_TABLET | Freq: Three times a day (TID) | ORAL | 5 refills | Status: DC
  Filled 2023-04-11: qty 90, 30d supply, fill #0
  Filled 2023-05-06: qty 90, 30d supply, fill #1
  Filled 2023-06-06: qty 90, 30d supply, fill #2
  Filled 2023-07-01 – 2023-07-02 (×3): qty 90, 30d supply, fill #3
  Filled 2023-08-01: qty 90, 30d supply, fill #4
  Filled 2023-09-15: qty 90, 30d supply, fill #5

## 2023-04-11 MED ORDER — RIVAROXABAN 20 MG PO TABS
20.0000 mg | ORAL_TABLET | Freq: Every day | ORAL | 5 refills | Status: DC
Start: 2023-04-11 — End: 2024-04-28
  Filled 2023-04-11: qty 30, 30d supply, fill #0
  Filled 2023-05-06: qty 30, 30d supply, fill #1
  Filled 2023-06-06: qty 30, 30d supply, fill #2
  Filled 2023-07-01 – 2023-07-02 (×3): qty 30, 30d supply, fill #3

## 2023-04-11 MED ORDER — SPIRONOLACTONE 25 MG PO TABS
25.0000 mg | ORAL_TABLET | Freq: Every day | ORAL | 5 refills | Status: DC
  Filled 2023-04-11: qty 30, 30d supply, fill #0
  Filled 2023-05-06: qty 30, 30d supply, fill #1
  Filled 2023-06-06: qty 30, 30d supply, fill #2
  Filled 2023-07-01 – 2023-07-02 (×3): qty 30, 30d supply, fill #3
  Filled 2023-08-01: qty 30, 30d supply, fill #4
  Filled 2023-09-15: qty 30, 30d supply, fill #5

## 2023-04-11 MED ORDER — ATORVASTATIN CALCIUM 40 MG PO TABS
40.0000 mg | ORAL_TABLET | Freq: Every day | ORAL | 5 refills | Status: DC
  Filled 2023-04-11: qty 30, 30d supply, fill #0
  Filled 2023-05-06: qty 30, 30d supply, fill #1
  Filled 2023-06-06: qty 30, 30d supply, fill #2
  Filled 2023-07-01 – 2023-07-02 (×3): qty 30, 30d supply, fill #3
  Filled 2023-08-01: qty 30, 30d supply, fill #4
  Filled 2023-09-15: qty 30, 30d supply, fill #5

## 2023-04-11 MED ORDER — AMIODARONE HCL 200 MG PO TABS
200.0000 mg | ORAL_TABLET | Freq: Every day | ORAL | 5 refills | Status: DC
Start: 2023-04-11 — End: 2023-10-10
  Filled 2023-04-11: qty 30, 30d supply, fill #0
  Filled 2023-05-06: qty 30, 30d supply, fill #1
  Filled 2023-06-06: qty 30, 30d supply, fill #2
  Filled 2023-07-01 – 2023-07-02 (×3): qty 30, 30d supply, fill #3
  Filled 2023-08-01: qty 30, 30d supply, fill #4
  Filled 2023-09-15: qty 30, 30d supply, fill #5

## 2023-04-11 MED ORDER — TORSEMIDE 20 MG PO TABS
60.0000 mg | ORAL_TABLET | Freq: Every day | ORAL | 5 refills | Status: DC
  Filled 2023-04-11: qty 90, 30d supply, fill #0
  Filled 2023-05-06: qty 90, 30d supply, fill #1
  Filled 2023-06-06: qty 90, 30d supply, fill #2
  Filled 2023-07-01 – 2023-07-02 (×3): qty 90, 30d supply, fill #3
  Filled 2023-08-01: qty 90, 30d supply, fill #4
  Filled 2023-09-15: qty 90, 30d supply, fill #5

## 2023-04-11 NOTE — Patient Instructions (Signed)
It was a pleasure seeing you today!  MEDICATIONS: -We are changing your medications today -Stop Eliquis and start Xarelto 20 mg (1 tablet) daily -Stop Marcelline Deist and start Inpefa 200 mg (1 tablet) daily -Call if you have questions about your medications.   NEXT APPOINTMENT: Return to clinic in 3 weeks with APP Clinic.  In general, to take care of your heart failure: -Limit your fluid intake to 2 Liters (half-gallon) per day.   -Limit your salt intake to ideally 2-3 grams (2000-3000 mg) per day. -Weigh yourself daily and record, and bring that "weight diary" to your next appointment.  (Weight gain of 2-3 pounds in 1 day typically means fluid weight.) -The medications for your heart are to help your heart and help you live longer.   -Please contact us before stopping any of your heart medications.  Call the clinic at [phone] with questions or to reschedule future appointments.

## 2023-04-11 NOTE — Telephone Encounter (Signed)
Advanced Heart Failure Patient Advocate Encounter  Sent POI to [employer] via efax.   Will follow up.

## 2023-04-11 NOTE — Addendum Note (Signed)
Encounter addended by: Evon Slack, RPH-CPP on: 04/11/2023 12:23 PM  Actions taken: Clinical Note Signed

## 2023-04-19 NOTE — Telephone Encounter (Signed)
Advanced Heart Failure Patient Advocate Encounter  Called [employer] to check the status of the patient's application. Representative stated that the patient needed to provide a 1040, not a W2. Called and left the patient a message.

## 2023-04-19 NOTE — Progress Notes (Signed)
ADVANCED HF CLINIC NOTE  Primary Care: Pcp, No HF Cardiologist: Dr. Shirlee Latch  HPI: Richard [name] is a 52 y.o.. male with type 2 diabetes, chronic combined systolic and diastolic heart failure, hypertension, chronic kidney disease stage IIla, H/o CVA, obesity and h/o poor compliance.   Echo 09/2015 EF 20-25%, RV mild dilated with moderately reduced systolic function, severe LAE.  Nonischemic cardiomyopathy, likely 2/2 poorly controlled HTN.  HIV negative, SPEP negative, no M spike. UPEP negative.  Had LHC in 2014, negative for CAD.  EF was 25-30% at that time. Improved on f/u echo in 2017. EF normalized 50-55%, GIIDD. He was being followed in the [facility] (Dr. Shirlee Latch) but lost to f/u.   In 2020, he was admitted w/ Cryptogenic stroke. 2D echo w/ normal EF, 50-55%, normal RV.  However huge discrepancy in EF by TEE. EF felt to be 20-25%, RV normal. No LV thrombus. EP was consulted for Implantable loop recorder for afib surveillance, however pt declined.    He established care w/ Dr. Izora Ribas 6/22. Seen given complaints of LEE. BP was elevated. GDMT adjusted. Losartan switched to Entresto, spironolactone added. He was ordered to get repeat echo w/ plans to start SLGTi at subsequent visit, but he never followed up.   Admitted 6/23 w/ a/c CHF in the setting of new atrial fibrillation w/ RVR. Echo EF back down to 25-30%, RV normal. HS trop 466>>349. LHC not perused given renal function, SCr 2.5 on admit. He was diuresed w/ IV Lasix. Placed on Eliquis for Afib. TEE LVEF 15-20%, Aortic valve did not open with each ventricular contraction, RV systolic function severely reduced, Moderate tricuspid regurgitation, Trivial mitral regurgitation, No LA/LAA thrombus or masses. Underwent DCCV x 1 back to NSR.    Unfortunately he had ERAF. EP consulted and recommended loading w/ IV amio and reattempt DCCV after load. However pt refused to stay and opted on repeat cardioversion as an outpatient. He was transitioned to  PO amiodarone 400 mg bid x 1 week then 200 mg daily thereafter. Eliquis continued. Transitioned to PO lasix + GDMT (losartan, spiro, Farxiga and coreg). SCr 2.1 day of d/c. Repeat DCCV scheduled 11/30/21. D/c wt 314 lb.    Had outpatient f/u w/ EP on 11/21/21. Was still in Afib, 110 bpm. Amiodarone reduced to 200 mg daily w/ plans to c/w outpatient DCCV. Labs showed SCr up to 2.78. BUN 24. K 4.5. CO2 21. He was instructed to hold Lasix and spiro x 1 day and increase PO intake.    Seen in [facility] 6/23, NYHA II symptoms but volume up and SCr up to 2.8. Lasix stopped, torsemide 40 mg daily started. Cleda Daub stopped with worsening renal function and low-dose BiDil added for afterload reduction. He was referred back to the [facility].   S/p DCCV 11/30/21 to NSR.  Admitted 9/24 w/ a/c CHF in the setting of med noncompliance and dietary indiscretion w/ sodium. He was markedly volume overloaded and diuresed w/ IV Lasix.  Echo showed EF 25%, RV mildly reduced. PICC placed and found to have low output HF by co-ox, started on milrinone and later titrated off.  RHC 10/7 showed mildly elevated PCWP at 16 with moderate mixed PAH/PVH, stable CI off milrinone. Cardiac MRI showed LV EF 30%, RV EF 27%, LGE in a pattern that could be consistent with prior myocarditis versus possible cardiac sarcoidosis. ECV percentage was also elevated at 37%, suggesting increased myocardial fibrotic content. ACE level normal. Outpatient cardiac PET was recommended for f/u. He was transitioned  to PO torsemide and GDMT titrated. Discharge wt 304 lb.   Today he returns for AHF follow up. Overall feeling pretty good. Denies palpitations, CP, dizziness, edema, or PND/Orthopnea. Denies SOB. Appetite good. No fever or chills. Weight at home 295-302 pounds. Taking all medications. Occasional beer, denies smoking . BP 120s/90s at home.  ECG (personally reviewed from 10/17): NSR 73 bpm   Labs (6/23): K 3.7, creatinine 2.69, hgb 14.5 Labs (10/23): K 3,7,  creatinine 2.13, hgb 11.1 Labs (10/24): K 3.5, SCr 2.3   Cardiac Testing  - Echo (6/23): EF 25-30%, severe LV dysfunction with global HK, normal RV - Echo (10/24): EF 25%, RV mildly reduced   - RHC (10/24)  Right Heart Pressures RHC Procedural Findings: Hemodynamics (mmHg) RA mean 8 RV 65/10 PA 64/31, mean 43 PCWP mean 16  Oxygen saturations: PA 70% AO 97%  Cardiac Output (Fick) 7.28  Cardiac Index (Fick) 2.85 PVR 3.7 WU  PAPi 4.1    CMRI 10/24 MPRESSION: 1. Mildly dilated LV with mild concentric LV hypertrophy. LV EF 30%, diffuse hypokinesis.   2.  Normal RV size with EF 27%.   3. Non-coronary pattern mid-wall LGE in the basal to mid septum, basal inferior wall, and basal inferior RV wall. Consider infiltrative disease such as cardiac sarcoidosis or prior myocarditis.  Extracellular volume percentage was elevated at 37%, suggesting  increased myocardial fibrotic content.    ROS: All systems reviewed and negative except as per HPI.   Past Medical History:  Diagnosis Date   Acute decompensated heart failure (HCC) 01/03/2013   Allergy    CHF (congestive heart failure) (HCC)    systolic   Chronic kidney disease    CKD (chronic kidney disease) stage 2, GFR 60-89 ml/min 01/04/2013   DM (diabetes mellitus) (HCC) 01/06/2013   Hypertension    hx htn emergency   Obesity    Other secondary pulmonary hypertension (HCC) 12/19/2021   Stroke [facility])    Current Outpatient Medications  Medication Sig Dispense Refill   acetaminophen (TYLENOL) 500 MG tablet Take 1,000 mg by mouth every 6 (six) hours as needed for moderate pain or headache.     amiodarone (PACERONE) 200 MG tablet Take 1 tablet (200 mg total) by mouth daily. 30 tablet 5   atorvastatin (LIPITOR) 40 MG tablet Take 1 tablet (40 mg total) by mouth daily. 30 tablet 5   carvedilol (COREG) 12.5 MG tablet Take 1 tablet (12.5 mg total) by mouth 2 (two) times daily with a meal. 60 tablet 5   hydrALAZINE (APRESOLINE)  100 MG tablet Take 1 tablet (100 mg total) by mouth 3 (three) times daily. 90 tablet 5   isosorbide mononitrate (IMDUR) 60 MG 24 hr tablet Take 1 tablet (60 mg total) by mouth daily. 30 tablet 0   rivaroxaban (XARELTO) 20 MG TABS tablet Take 1 tablet (20 mg total) by mouth daily with supper. 30 tablet 5   Sotagliflozin (INPEFA) 200 MG TABS Take 1 tablet by mouth daily. 30 tablet 5   spironolactone (ALDACTONE) 25 MG tablet Take 1 tablet (25 mg total) by mouth daily. 30 tablet 5   torsemide (DEMADEX) 20 MG tablet Take 3 tablets (60 mg total) by mouth daily. 90 tablet 5   No current facility-administered medications for this encounter.   No Known Allergies  Social History   Socioeconomic History   Marital status: Single    Spouse name: Not on file   Number of children: 0   Years of education: Not on  file   Highest education level: Bachelor's degree (e.g., BA, AB, BS)  Occupational History   Occupation: English as a second language teacher  Tobacco Use   Smoking status: Never   Smokeless tobacco: Never  Vaping Use   Vaping status: Never Used  Substance and Sexual Activity   Alcohol use: Not Currently   Drug use: No   Sexual activity: Not on file  Other Topics Concern   Not on file  Social History Narrative   Work or School: Technical sales engineer, Investment banker, corporate      Home Situation: lives alone      Spiritual Beliefs: Christian      Lifestyle: 15 minutes dialy walking - working up; working on diet            Social Determinants of Corporate investment banker Strain: Low Risk  (11/13/2021)   Overall Financial Resource Strain (CARDIA)    Difficulty of Paying Living Expenses: Not very hard  Food Insecurity: No Food Insecurity (03/04/2023)   Hunger Vital Sign    Worried About Running Out of Food in the Last Year: Never true    Ran Out of Food in the Last Year: Never true  Transportation Needs: No Transportation Needs (03/04/2023)   PRAPARE - Scientist, research (physical sciences) (Medical): No    Lack of Transportation (Non-Medical): No  Physical Activity: Sufficiently Active (11/24/2021)   Exercise Vital Sign    Days of Exercise per Week: 7 days    Minutes of Exercise per Session: 30 min  Stress: No Stress Concern Present (11/24/2021)   [employer] of Occupational Health - Occupational Stress Questionnaire    Feeling of Stress : Only a little  Social Connections: Moderately Integrated (11/24/2021)   Social Connection and Isolation Panel [NHANES]    Frequency of Communication with Friends and Family: More than three times a week    Frequency of Social Gatherings with Friends and Family: Three times a week    Attends Religious Services: More than 4 times per year    Active Member of Clubs or Organizations: Yes    Attends Banker Meetings: More than 4 times per year    Marital Status: Never married  Intimate Partner Violence: Not At Risk (03/04/2023)   Humiliation, Afraid, Rape, and Kick questionnaire    Fear of Current or Ex-Partner: No    Emotionally Abused: No    Physically Abused: No    Sexually Abused: No   Family History  Problem Relation Age of Onset   Heart disease Father 61       MI   Hypertension Father    Hyperlipidemia Father    Diabetes Mother    Hypertension Mother    BP 120/84   Pulse 73   Wt (!) 139.3 kg (307 lb)   SpO2 98%   BMI 42.82 kg/m   Wt Readings from Last 3 Encounters:  05/01/23 (!) 139.3 kg (307 lb)  04/11/23 (!) 140.2 kg (309 lb)  03/21/23 (!) 139.3 kg (307 lb)   PHYSICAL EXAM: General:  well appearing.  No respiratory difficulty. Walked into clinic HEENT: normal Neck: supple. JVD ~8 cm. Carotids 2+ bilat; no bruits. No lymphadenopathy or thyromegaly appreciated. Cor: PMI nondisplaced. Regular rate & rhythm. No rubs, gallops or murmurs. Lungs: clear Abdomen: soft, nontender, nondistended. No hepatosplenomegaly. No bruits or masses. Good bowel sounds. Extremities: no cyanosis, clubbing,  rash, edema  Neuro: alert & oriented x 3, cranial nerves grossly intact. moves  all 4 extremities w/o difficulty. Affect pleasant.   ASSESSMENT & PLAN: 1. Chronic Systolic Heart Failure - NICM, likely 2/2 poorly controlled HTN + tachymediated from rapid Afib . - Echo (2014): EF 25-30%, LHC no CAD  - Echo (4/17): EF 20-25%, RV mild dilated with moderately reduced systolic function, severe LAE - SPEP negative, no M spike. UPEP negative in 2017  - Echo (10/17): EF normalized, 50-55% - Echo (12/20): EF 50-55% - Echo (6/23): EF 25-30%, global HK, RV normal. In setting of new Afib w/ RVR, uncontrolled hypertension and poor compliance w/ meds  - Echo (10/24): EF 25%, RV mildly reduced - Admitted 10/24 for a/c CHF in setting of dietary indiscretion and poor med compliance. Co-ox c/w low output, required inotropic support w/ milrinone to help w/ diuresis  - RHC 10/24 after diuresis and weaning off of milrinone showed mildly elevated PCWP at 16 with moderate mixed PAH/PVH, stable CI off milrinone>>transitioned back to oral diuretics and GDMT - cMRI 10/24 showed LV EF 30%, RV EF 27%, LGE in a pattern that could be consistent with prior myocarditis versus possible cardiac sarcoidosis. ECV percentage was also elevated at 37%, suggesting increased myocardial fibrotic content. ACE level normal. No h/o lung disease and no FH of sarcoidosis - Needs Cardiac PET for further assessment to r/o sarcoidosis, has been ordered but I think we are waiting on insurance approval still - NYHA Class II. Volume stable on exam  - Did not qualify for patient assistance for Jardiance/Farxiga. Now on Impefa 200 mg daily - Continue Entresto 24-26 mg bid. Will give samples today. (Pharmacy team to see, patient assistance program requesting additional forms to qualify) - Continue Spironolactone 25 mg  - Continue Imdur 60 mg  - Continue Hydralazine 100 mg tid  - Continue Coreg 12.5 mg bid  - Continue Torsemide 60 mg daily  - No  digoxin w/ CKD. - Not current candidate for LHC given CKD w/ b/l SCr >2 - Check BMET and BNP today    2. Persistent Atrial Fibrillation  - Diagnosed 6/23 - s/p TEE/DCCV to NSR w/ ERAF - s/p DCCV 11/30/21 to NSR. - NSR on ECG today. - Continue amiodarone 200 mg daily. Check HFTs, TFTs. Needs annual eye exams   - Eliquis stopped as he's over the income for PAP, now on Xarelto 20 mg daily (HF fund)  - Continue beta blocker.   - Followed in EP Clinic. Needs to be considered for Afib ablation. LA diameter 4.40 cm. Needs to f/u with Dr. Elberta Fortis - Has Underlying OSA. Recent + sleep study 8/24. Will need treatment w/ CPAP    3. Hypertension  - controlled on current regimen - GDMT titration per above    4. CKD Stage IIIb - IV - Suspect hypertensive/ DM nephropathy  - Baseline SCr 2.1-2.5   - Continue Farxiga 10 mg daily.  - Check BMP today  - He has been referred to [facility].    5. Type 2 DM  - Recent Hgb A1c 6.4.  - Continue impefa   6. H/o CVA  - Cryptogenetic (2020). Refused loop recorder. - ? If cardioembolic, given recent afib detection. - Now on Xarelto    7. OSA - + sleep study 8/24 - Needs CPAP, will refer to Dr. Mayford Knife CPAP initiation  Pharmacy team to see for assistance on meds and patient assistance applications.    F/u in 3-4 months with Dr. Thressa Sheller, AGACNP-BC  05/01/23

## 2023-04-23 ENCOUNTER — Other Ambulatory Visit (HOSPITAL_COMMUNITY): Payer: Self-pay

## 2023-04-30 ENCOUNTER — Other Ambulatory Visit (HOSPITAL_COMMUNITY): Payer: Self-pay

## 2023-05-01 ENCOUNTER — Other Ambulatory Visit (HOSPITAL_COMMUNITY): Payer: Self-pay

## 2023-05-01 ENCOUNTER — Ambulatory Visit (HOSPITAL_COMMUNITY)
Admission: RE | Admit: 2023-05-01 | Discharge: 2023-05-01 | Disposition: A | Discharge status: Home / Self Care | Payer: MEDICAID | Source: Ambulatory Visit | Attending: Internal Medicine | Admitting: Internal Medicine

## 2023-05-01 ENCOUNTER — Encounter (HOSPITAL_COMMUNITY): Payer: Self-pay

## 2023-05-01 VITALS — BP 120/84 | HR 73 | Wt 307.0 lb

## 2023-05-01 DIAGNOSIS — E1122 Type 2 diabetes mellitus with diabetic chronic kidney disease: Secondary | ICD-10-CM | POA: Insufficient documentation

## 2023-05-01 DIAGNOSIS — I5020 Unspecified systolic (congestive) heart failure: Secondary | ICD-10-CM

## 2023-05-01 DIAGNOSIS — I4819 Other persistent atrial fibrillation: Secondary | ICD-10-CM | POA: Insufficient documentation

## 2023-05-01 DIAGNOSIS — I428 Other cardiomyopathies: Secondary | ICD-10-CM | POA: Insufficient documentation

## 2023-05-01 DIAGNOSIS — E119 Type 2 diabetes mellitus without complications: Secondary | ICD-10-CM

## 2023-05-01 DIAGNOSIS — I130 Hypertensive heart and chronic kidney disease with heart failure and stage 1 through stage 4 chronic kidney disease, or unspecified chronic kidney disease: Secondary | ICD-10-CM | POA: Insufficient documentation

## 2023-05-01 DIAGNOSIS — Z7901 Long term (current) use of anticoagulants: Secondary | ICD-10-CM | POA: Insufficient documentation

## 2023-05-01 DIAGNOSIS — Z8673 Personal history of transient ischemic attack (TIA), and cerebral infarction without residual deficits: Secondary | ICD-10-CM | POA: Insufficient documentation

## 2023-05-01 DIAGNOSIS — N1832 Chronic kidney disease, stage 3b: Secondary | ICD-10-CM | POA: Insufficient documentation

## 2023-05-01 DIAGNOSIS — I5022 Chronic systolic (congestive) heart failure: Secondary | ICD-10-CM | POA: Insufficient documentation

## 2023-05-01 DIAGNOSIS — Z79899 Other long term (current) drug therapy: Secondary | ICD-10-CM | POA: Insufficient documentation

## 2023-05-01 DIAGNOSIS — I10 Essential (primary) hypertension: Secondary | ICD-10-CM

## 2023-05-01 DIAGNOSIS — G4733 Obstructive sleep apnea (adult) (pediatric): Secondary | ICD-10-CM | POA: Insufficient documentation

## 2023-05-01 LAB — BASIC METABOLIC PANEL
Anion gap: 10 (ref 5–15)
BUN: 44 mg/dL — ABNORMAL HIGH (ref 6–20)
CO2: 23 mmol/L (ref 22–32)
Calcium: 9.3 mg/dL (ref 8.9–10.3)
Chloride: 102 mmol/L (ref 98–111)
Creatinine, Ser: 2.39 mg/dL — ABNORMAL HIGH (ref 0.61–1.24)
GFR, Estimated: 32 mL/min — ABNORMAL LOW (ref >60)
Glucose, Bld: 196 mg/dL — ABNORMAL HIGH (ref 70–99)
Potassium: 4.1 mmol/L (ref 3.5–5.1)
Sodium: 135 mmol/L (ref 135–145)

## 2023-05-01 LAB — BRAIN NATRIURETIC PEPTIDE: B Natriuretic Peptide: 140.8 pg/mL — ABNORMAL HIGH (ref 0.0–100.0)

## 2023-05-01 MED ORDER — ISOSORBIDE MONONITRATE ER 60 MG PO TB24
60.0000 mg | ORAL_TABLET | Freq: Every day | ORAL | 3 refills | Status: DC
  Filled 2023-05-01: qty 30, 30d supply, fill #0
  Filled 2023-06-06: qty 30, 30d supply, fill #1
  Filled 2023-07-01 – 2023-07-02 (×3): qty 30, 30d supply, fill #2
  Filled 2023-08-01: qty 30, 30d supply, fill #3
  Filled 2023-09-15: qty 30, 30d supply, fill #4
  Filled 2023-10-10: qty 30, 30d supply, fill #5
  Filled 2023-11-20: qty 30, 30d supply, fill #6
  Filled 2023-12-15 – 2024-01-06 (×2): qty 30, 30d supply, fill #7
  Filled 2024-03-16 – 2024-04-20 (×2): qty 30, 30d supply, fill #8

## 2023-05-01 NOTE — Telephone Encounter (Signed)
Spoke with patient in office; he will provide 1040 to AHF when able

## 2023-05-01 NOTE — Progress Notes (Signed)
Medication Samples have been provided to the patient.  Drug name: Sherryll Burger       Strength: 24/26 mg        Qty: 2  LOT: [id number]  Exp.Date: 06/03/2025  Dosing instructions: Take 1 tablet Twice daily   The patient has been instructed regarding the correct time, dose, and frequency of taking this medication, including desired effects and most common side effects.   Smitty Cords Trice Aspinall 12:31 PM 05/01/2023

## 2023-05-01 NOTE — Patient Instructions (Addendum)
There has been no changes to your medication.  Labs done today, your results will be available in MyChart, we will contact you for abnormal readings.  You have been referred to Dr. Mayford Knife for CPAP. Her office will call you to arrange your appointment.  PLEASE FOLLOW UP WITH DR. Elberta Fortis.  Your physician recommends that you schedule a follow-up appointment in: 4 months ( March 2025) ** PLEASE CALL THE OFFICE IN [place] TO ARRANGE YOUR FOLLOW UP APPOINTMENT. **  If you have any questions or concerns before your next appointment please send [place] a message through [place] or call our office at [phone].    TO LEAVE A MESSAGE FOR THE NURSE SELECT OPTION 2, PLEASE LEAVE A MESSAGE INCLUDING: YOUR NAME DATE OF BIRTH CALL BACK NUMBER REASON FOR CALL**this is important as we prioritize the call backs  YOU WILL RECEIVE A CALL BACK THE SAME DAY AS LONG AS YOU CALL BEFORE 4:00 PM  At the [facility], you and your health needs are our priority. As part of our continuing mission to provide you with exceptional heart care, we have created designated Provider Care Teams. These Care Teams include your primary Cardiologist (physician) and Advanced Practice Providers (APPs- Physician Assistants and Nurse Practitioners) who all work together to provide you with the care you need, when you need it.   You may see any of the following providers on your designated Care Team at your next follow up: Dr Arvilla Meres Dr Marca Ancona Dr. Dorthula Nettles Dr. Clearnce Hasten Amy Filbert Schilder, NP Robbie Lis, [place] [facility] [place], [place] Brynda Peon, NP [place] Lee, NP Karle Plumber, PharmD   Please be sure to bring in all your medications bottles to every appointment.    Thank you for choosing [facility]

## 2023-05-07 ENCOUNTER — Other Ambulatory Visit (HOSPITAL_COMMUNITY): Payer: Self-pay

## 2023-05-07 ENCOUNTER — Other Ambulatory Visit: Payer: Self-pay

## 2023-05-08 ENCOUNTER — Other Ambulatory Visit (HOSPITAL_COMMUNITY): Payer: Self-pay

## 2023-05-09 ENCOUNTER — Other Ambulatory Visit (HOSPITAL_COMMUNITY): Payer: Self-pay

## 2023-05-10 ENCOUNTER — Other Ambulatory Visit (HOSPITAL_COMMUNITY): Payer: Self-pay

## 2023-05-13 ENCOUNTER — Other Ambulatory Visit (HOSPITAL_COMMUNITY): Payer: Self-pay

## 2023-05-14 ENCOUNTER — Other Ambulatory Visit (HOSPITAL_COMMUNITY): Payer: Self-pay

## 2023-05-15 ENCOUNTER — Other Ambulatory Visit: Payer: Self-pay

## 2023-05-15 ENCOUNTER — Other Ambulatory Visit (HOSPITAL_COMMUNITY): Payer: Self-pay

## 2023-05-27 ENCOUNTER — Other Ambulatory Visit (HOSPITAL_COMMUNITY): Payer: Self-pay

## 2023-05-30 ENCOUNTER — Other Ambulatory Visit: Payer: Self-pay

## 2023-06-06 ENCOUNTER — Other Ambulatory Visit: Payer: Self-pay

## 2023-06-06 ENCOUNTER — Other Ambulatory Visit (HOSPITAL_COMMUNITY): Payer: Self-pay

## 2023-06-11 ENCOUNTER — Other Ambulatory Visit (HOSPITAL_COMMUNITY): Payer: Self-pay

## 2023-07-01 ENCOUNTER — Other Ambulatory Visit: Payer: Self-pay

## 2023-07-01 ENCOUNTER — Other Ambulatory Visit (HOSPITAL_COMMUNITY): Payer: Self-pay

## 2023-07-02 ENCOUNTER — Other Ambulatory Visit (HOSPITAL_COMMUNITY): Payer: Self-pay

## 2023-07-02 ENCOUNTER — Other Ambulatory Visit: Payer: Self-pay

## 2023-07-05 ENCOUNTER — Other Ambulatory Visit (HOSPITAL_COMMUNITY): Payer: Self-pay

## 2023-07-08 ENCOUNTER — Other Ambulatory Visit (HOSPITAL_COMMUNITY): Payer: Self-pay

## 2023-07-10 ENCOUNTER — Other Ambulatory Visit (HOSPITAL_COMMUNITY): Payer: Self-pay

## 2023-08-01 ENCOUNTER — Other Ambulatory Visit (HOSPITAL_COMMUNITY): Payer: Self-pay

## 2023-08-31 IMAGING — CR DG CHEST 2V
2 series · 2 of 2 positions shown · non-contrast
Comparison: 05/20/2019

CLINICAL DATA: Dyspnea

EXAM:
CHEST - 2 VIEW

[chest pa]
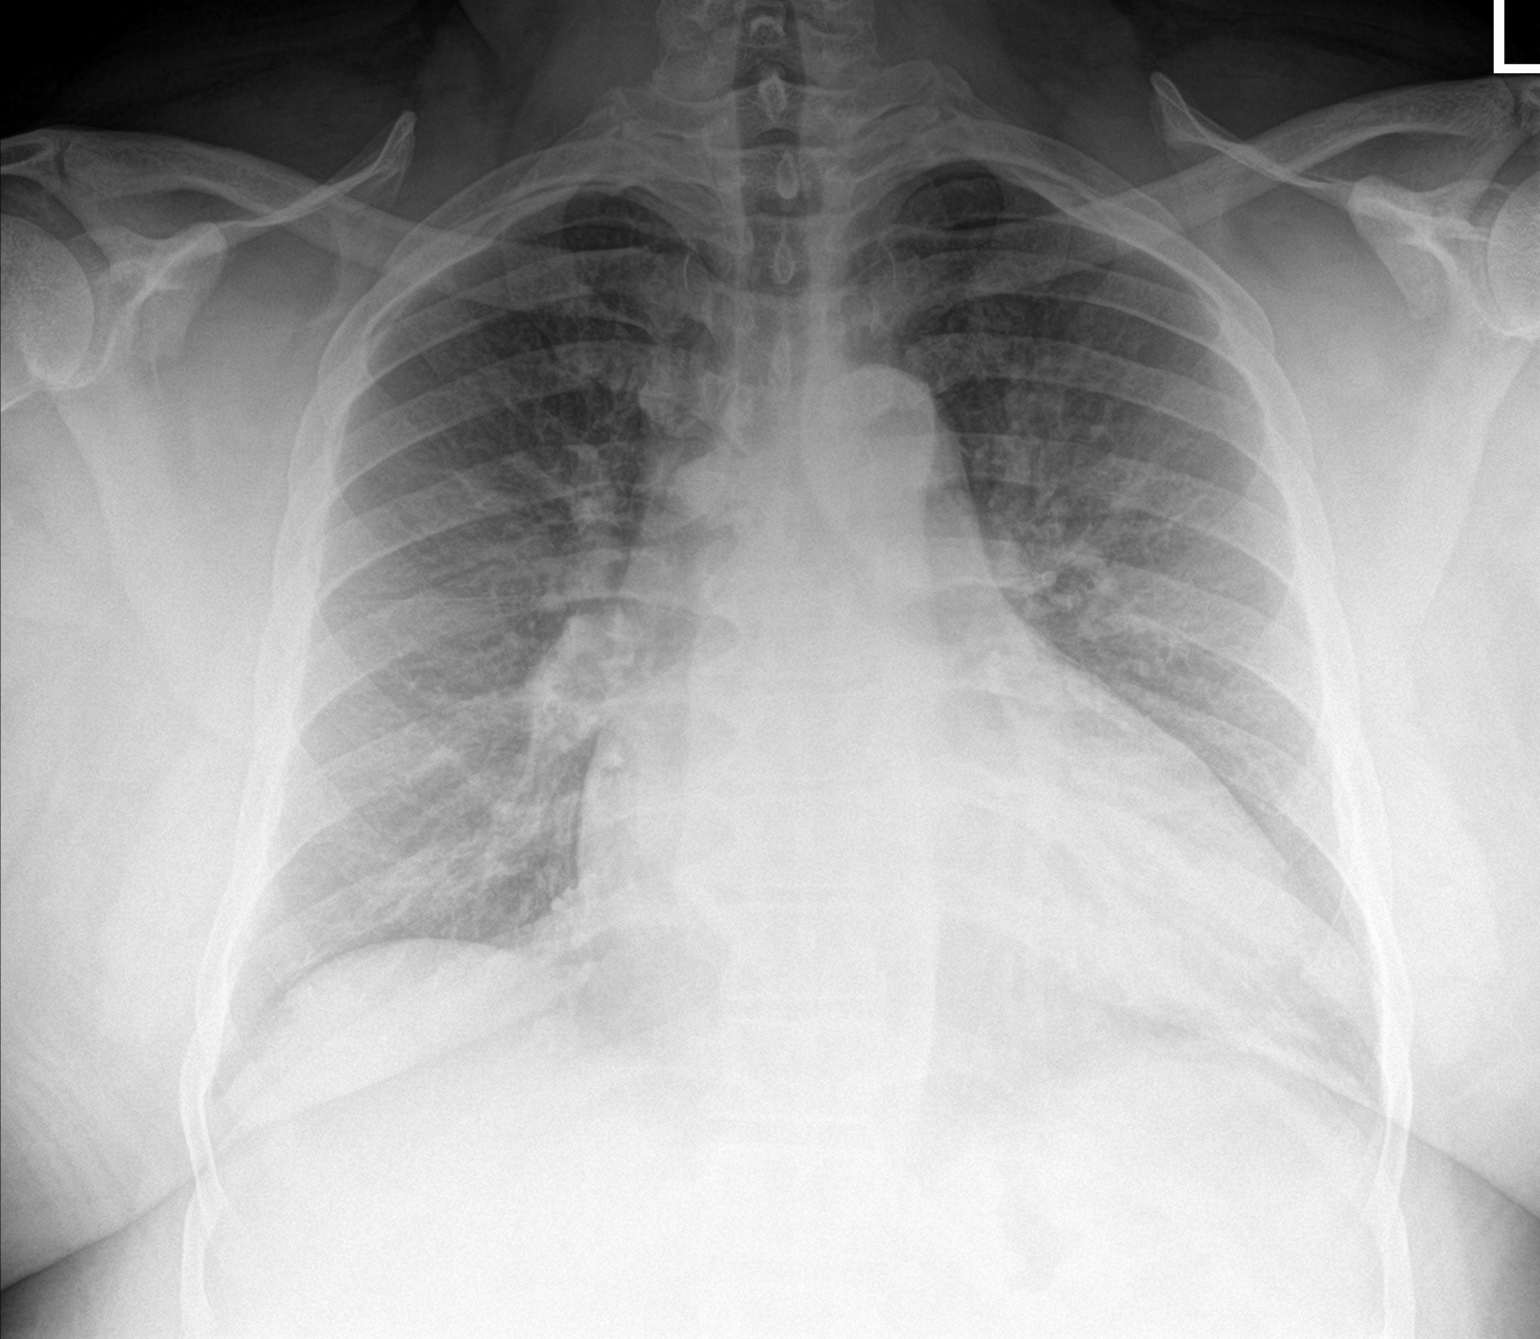

[chest lat]
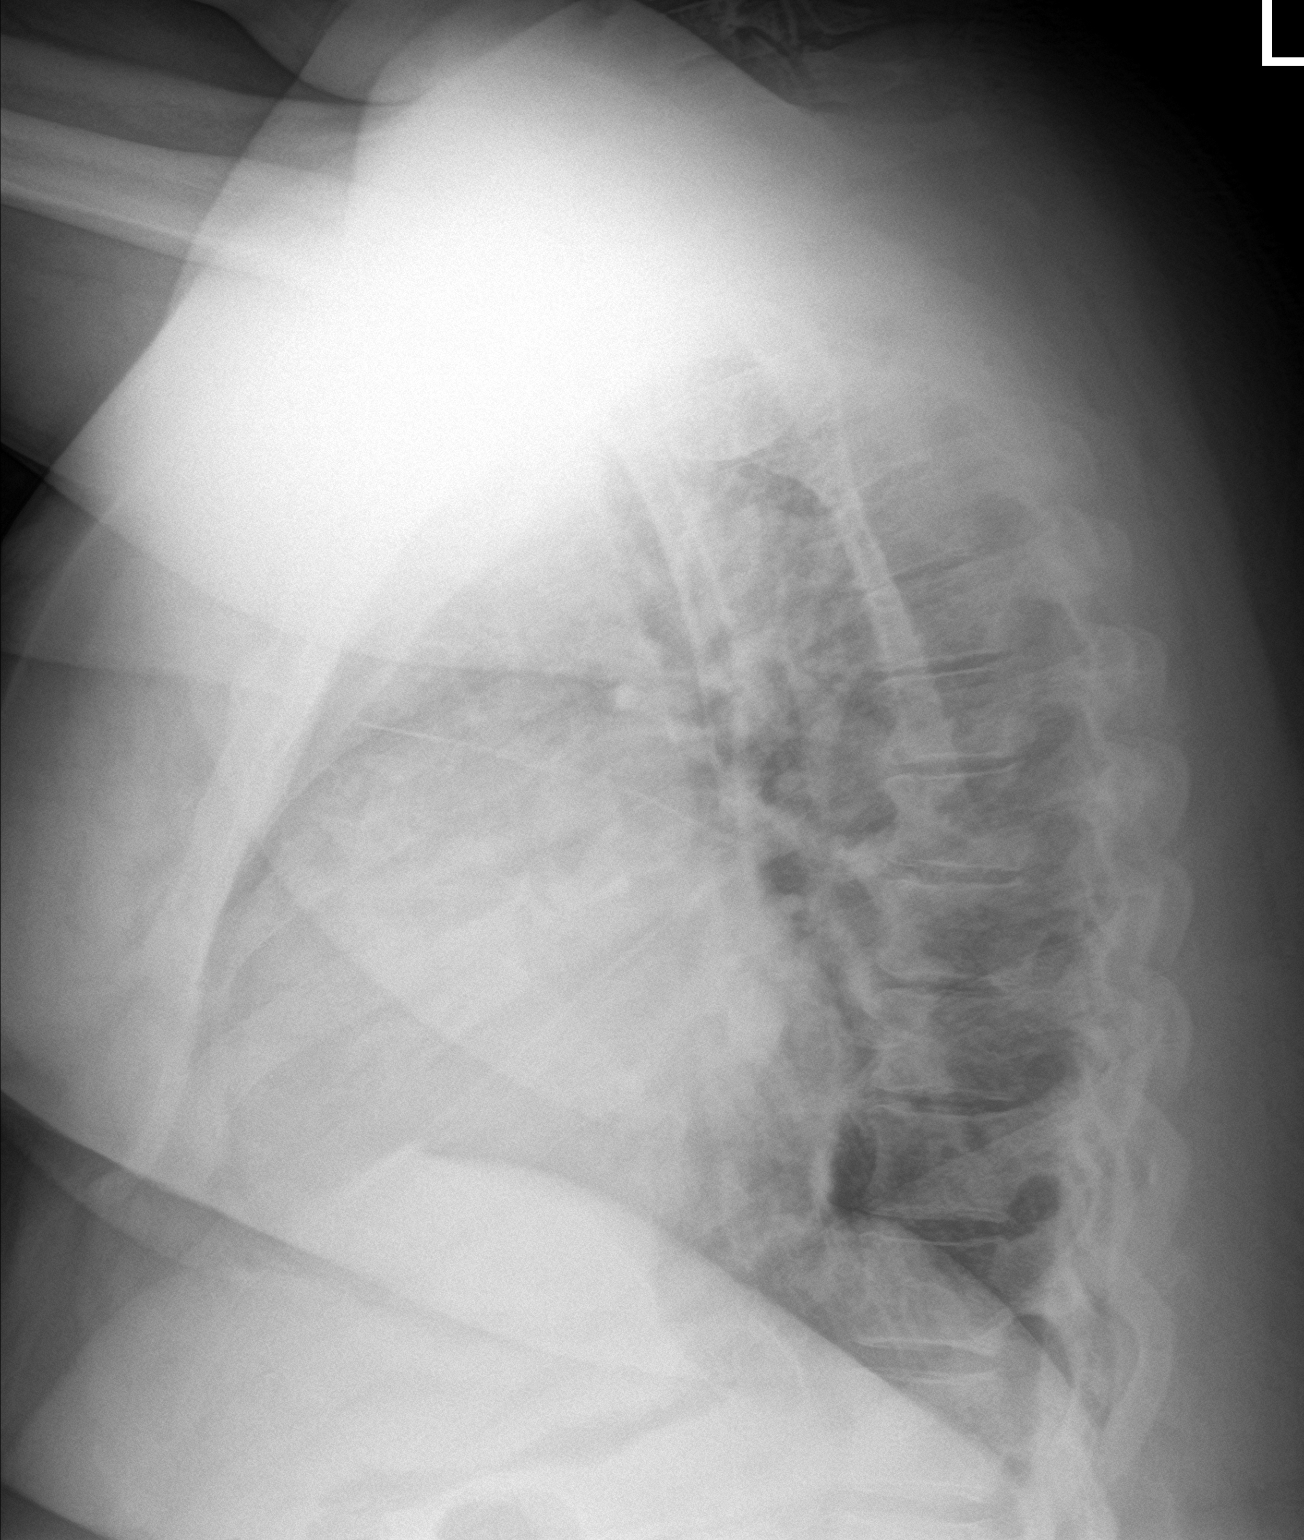

[2 of 2 positions shown; findings below may reference images not displayed]

FINDINGS: Cardiac shadow is enlarged. Mild vascular congestion is noted
without significant interstitial edema. No focal infiltrate or
effusion is noted. No bony abnormality is seen.
IMPRESSION: Vascular congestion without edema.

## 2023-09-16 ENCOUNTER — Other Ambulatory Visit (HOSPITAL_COMMUNITY): Payer: Self-pay

## 2023-10-10 ENCOUNTER — Other Ambulatory Visit (HOSPITAL_COMMUNITY): Payer: Self-pay | Admitting: Cardiology

## 2023-10-10 DIAGNOSIS — I639 Cerebral infarction, unspecified: Secondary | ICD-10-CM

## 2023-10-11 ENCOUNTER — Other Ambulatory Visit (HOSPITAL_COMMUNITY): Payer: Self-pay

## 2023-10-11 ENCOUNTER — Other Ambulatory Visit: Payer: Self-pay

## 2023-10-11 MED ORDER — SPIRONOLACTONE 25 MG PO TABS
25.0000 mg | ORAL_TABLET | Freq: Every day | ORAL | 1 refills | Status: DC
  Filled 2023-10-11: qty 30, 30d supply, fill #0
  Filled 2023-11-20: qty 30, 30d supply, fill #1

## 2023-10-11 MED ORDER — ATORVASTATIN CALCIUM 40 MG PO TABS
40.0000 mg | ORAL_TABLET | Freq: Every day | ORAL | 1 refills | Status: DC
  Filled 2023-10-11: qty 30, 30d supply, fill #0
  Filled 2023-11-20: qty 30, 30d supply, fill #1

## 2023-10-11 MED ORDER — AMIODARONE HCL 200 MG PO TABS
200.0000 mg | ORAL_TABLET | Freq: Every day | ORAL | 1 refills | Status: DC
  Filled 2023-10-11: qty 30, 30d supply, fill #0
  Filled 2023-11-20: qty 30, 30d supply, fill #1

## 2023-10-11 MED ORDER — HYDRALAZINE HCL 100 MG PO TABS
100.0000 mg | ORAL_TABLET | Freq: Three times a day (TID) | ORAL | 1 refills | Status: DC
  Filled 2023-10-11: qty 90, 30d supply, fill #0
  Filled 2023-11-20: qty 90, 30d supply, fill #1

## 2023-10-11 MED ORDER — CARVEDILOL 12.5 MG PO TABS
12.5000 mg | ORAL_TABLET | Freq: Two times a day (BID) | ORAL | 1 refills | Status: DC
  Filled 2023-10-11: qty 60, 30d supply, fill #0
  Filled 2023-11-20: qty 60, 30d supply, fill #1

## 2023-10-11 MED ORDER — TORSEMIDE 20 MG PO TABS
60.0000 mg | ORAL_TABLET | Freq: Every day | ORAL | 1 refills | Status: DC
  Filled 2023-10-11: qty 90, 30d supply, fill #0
  Filled 2023-11-20: qty 90, 30d supply, fill #1

## 2023-10-16 ENCOUNTER — Other Ambulatory Visit (HOSPITAL_COMMUNITY): Payer: Self-pay

## 2023-11-21 ENCOUNTER — Other Ambulatory Visit (HOSPITAL_COMMUNITY): Payer: Self-pay

## 2023-11-22 ENCOUNTER — Other Ambulatory Visit (HOSPITAL_COMMUNITY): Payer: Self-pay

## 2023-12-15 ENCOUNTER — Other Ambulatory Visit (HOSPITAL_COMMUNITY): Payer: Self-pay | Admitting: Cardiology

## 2023-12-15 ENCOUNTER — Other Ambulatory Visit (HOSPITAL_COMMUNITY): Payer: Self-pay

## 2023-12-15 DIAGNOSIS — I639 Cerebral infarction, unspecified: Secondary | ICD-10-CM

## 2023-12-16 ENCOUNTER — Other Ambulatory Visit: Payer: Self-pay

## 2023-12-16 ENCOUNTER — Other Ambulatory Visit (HOSPITAL_COMMUNITY): Payer: Self-pay

## 2023-12-16 MED ORDER — CARVEDILOL 12.5 MG PO TABS
12.5000 mg | ORAL_TABLET | Freq: Two times a day (BID) | ORAL | 1 refills | Status: DC
  Filled 2023-12-16 – 2024-01-06 (×2): qty 60, 30d supply, fill #0
  Filled 2024-03-16 – 2024-04-20 (×2): qty 60, 30d supply, fill #1

## 2023-12-16 MED ORDER — SPIRONOLACTONE 25 MG PO TABS
25.0000 mg | ORAL_TABLET | Freq: Every day | ORAL | 1 refills | Status: DC
  Filled 2023-12-16 – 2024-01-06 (×2): qty 30, 30d supply, fill #0
  Filled 2024-03-16: qty 30, 30d supply, fill #1

## 2023-12-16 MED ORDER — TORSEMIDE 20 MG PO TABS
60.0000 mg | ORAL_TABLET | Freq: Every day | ORAL | 1 refills | Status: DC
  Filled 2023-12-16 – 2024-01-06 (×2): qty 90, 30d supply, fill #0
  Filled 2024-03-16: qty 90, 30d supply, fill #1

## 2023-12-16 MED ORDER — HYDRALAZINE HCL 100 MG PO TABS
100.0000 mg | ORAL_TABLET | Freq: Three times a day (TID) | ORAL | 0 refills | Status: DC
  Filled 2023-12-16 – 2024-01-06 (×2): qty 90, 30d supply, fill #0

## 2023-12-16 MED ORDER — AMIODARONE HCL 200 MG PO TABS
200.0000 mg | ORAL_TABLET | Freq: Every day | ORAL | 1 refills | Status: DC
  Filled 2023-12-16 – 2024-01-06 (×2): qty 30, 30d supply, fill #0
  Filled 2024-03-16: qty 30, 30d supply, fill #1

## 2023-12-16 MED ORDER — ATORVASTATIN CALCIUM 40 MG PO TABS
40.0000 mg | ORAL_TABLET | Freq: Every day | ORAL | 1 refills | Status: DC
  Filled 2023-12-16 – 2024-01-06 (×2): qty 30, 30d supply, fill #0
  Filled 2024-03-16 – 2024-04-20 (×2): qty 30, 30d supply, fill #1

## 2023-12-23 ENCOUNTER — Other Ambulatory Visit (HOSPITAL_COMMUNITY): Payer: Self-pay

## 2023-12-25 ENCOUNTER — Other Ambulatory Visit (HOSPITAL_COMMUNITY): Payer: Self-pay

## 2024-01-06 ENCOUNTER — Other Ambulatory Visit: Payer: Self-pay

## 2024-01-06 ENCOUNTER — Other Ambulatory Visit (HOSPITAL_COMMUNITY): Payer: Self-pay

## 2024-03-16 ENCOUNTER — Other Ambulatory Visit (HOSPITAL_COMMUNITY): Payer: Self-pay

## 2024-03-16 ENCOUNTER — Other Ambulatory Visit (HOSPITAL_COMMUNITY): Payer: Self-pay | Admitting: Cardiology

## 2024-03-16 ENCOUNTER — Other Ambulatory Visit: Payer: Self-pay

## 2024-03-16 MED ORDER — HYDRALAZINE HCL 100 MG PO TABS
100.0000 mg | ORAL_TABLET | Freq: Three times a day (TID) | ORAL | 0 refills | Status: AC
Start: 2024-03-16 — End: ?
  Filled 2024-03-16 – 2024-04-20 (×2): qty 90, 30d supply, fill #0

## 2024-03-18 ENCOUNTER — Other Ambulatory Visit: Payer: Self-pay

## 2024-03-18 ENCOUNTER — Encounter: Payer: Self-pay | Admitting: Pharmacist

## 2024-03-23 ENCOUNTER — Other Ambulatory Visit: Payer: Self-pay

## 2024-03-27 ENCOUNTER — Telehealth (HOSPITAL_COMMUNITY): Payer: Self-pay

## 2024-03-27 NOTE — Telephone Encounter (Signed)
 Called to confirm/remind patient of their appointment at the [facility] on 03/30/24.   Appointment:   [x] Confirmed  [] Left mess   [] No answer/No voice mail  [] VM Full/unable to leave message  [] Phone not in service  Patient reminded to bring all medications and/or complete list.  Confirmed patient has transportation. Gave directions, instructed to utilize valet parking.

## 2024-03-27 NOTE — Progress Notes (Signed)
 ADVANCED HF CLINIC NOTE  Primary Care: Pcp, No HF Cardiologist: Dr. Rolan  HPI: Richard [name] is a 53 y.o.. male with type 2 diabetes, chronic combined systolic and diastolic heart failure, hypertension, chronic kidney disease stage IIla, H/o CVA, obesity and h/o poor compliance.   Echo 09/2015 EF 20-25%, RV mild dilated with moderately reduced systolic function, severe LAE.  Nonischemic cardiomyopathy, likely 2/2 poorly controlled HTN.  HIV negative, SPEP negative, no M spike. UPEP negative.  Had LHC in 2014, negative for CAD.  EF was 25-30% at that time. Improved on f/u echo in 2017. EF normalized 50-55%, GIIDD. He was being followed in the [facility] (Dr. Rolan) but lost to f/u.   In 2020, he was admitted w/ Cryptogenic stroke. 2D echo w/ normal EF, 50-55%, normal RV.  However huge discrepancy in EF by TEE. EF felt to be 20-25%, RV normal. No LV thrombus. EP was consulted for Implantable loop recorder for afib surveillance, however pt declined.    He established care w/ Dr. Santo 6/22. Seen given complaints of LEE. BP was elevated. GDMT adjusted. Losartan  switched to Entresto , spironolactone  added. He was ordered to get repeat echo w/ plans to start SLGTi at subsequent visit, but he never followed up.   Admitted 6/23 w/ a/c CHF in the setting of new atrial fibrillation w/ RVR. Echo EF back down to 25-30%, RV normal. HS trop 466>>349. LHC not perused given renal function, SCr 2.5 on admit. He was diuresed w/ IV Lasix . Placed on Eliquis  for Afib. TEE LVEF 15-20%, Aortic valve did not open with each ventricular contraction, RV systolic function severely reduced, Moderate tricuspid regurgitation, Trivial mitral regurgitation, No LA/LAA thrombus or masses. Underwent DCCV x 1 back to NSR.    Unfortunately he had ERAF. EP consulted and recommended loading w/ IV amio and reattempt DCCV after load. However pt refused to stay and opted on repeat cardioversion as an outpatient. He was transitioned to  PO amiodarone  400 mg bid x 1 week then 200 mg daily thereafter. Eliquis  continued. Transitioned to PO lasix  + GDMT (losartan , spiro, Farxiga  and coreg ). SCr 2.1 day of d/c. Repeat DCCV scheduled 11/30/21. D/c wt 314 lb.    Had outpatient f/u w/ EP on 11/21/21. Was still in Afib, 110 bpm. Amiodarone  reduced to 200 mg daily w/ plans to c/w outpatient DCCV. Labs showed SCr up to 2.78. BUN 24. K 4.5. CO2 21. He was instructed to hold Lasix  and spiro x 1 day and increase PO intake.    Seen in [facility] 6/23, NYHA II symptoms but volume up and SCr up to 2.8. Lasix  stopped, torsemide  40 mg daily started. Spiro stopped with worsening renal function and low-dose BiDil  added for afterload reduction. He was referred back to the [facility].   S/p DCCV 11/30/21 to NSR.  Admitted 9/24 w/ a/c CHF in the setting of med noncompliance and dietary indiscretion w/ sodium. He was markedly volume overloaded and diuresed w/ IV Lasix .  Echo showed EF 25%, RV mildly reduced. PICC placed and found to have low output HF by co-ox, started on milrinone  and later titrated off.  RHC 10/7 showed mildly elevated PCWP at 16 with moderate mixed PAH/PVH, stable CI off milrinone . Cardiac MRI showed LV EF 30%, RV EF 27%, LGE in a pattern that could be consistent with prior myocarditis versus possible cardiac sarcoidosis. ECV percentage was also elevated at 37%, suggesting increased myocardial fibrotic content. ACE level normal. Outpatient cardiac PET was recommended for f/u. He was transitioned  to PO torsemide  and GDMT titrated. Discharge wt 304 lb.   Today he returns for HF follow up. We have not seen him since 04/2023. Breathing is heavy for past week. Feels chest pressure. Abdomen feels full. Had 2 recent deaths in family, he attributes symptoms to these stressors. Denies palpitations, abnormal bleeding, CP, dizziness, or PND/Orthopnea. Appetite ok. Weight at home 310 pounds.No tobacco, ETOH or drugs. Works FT at [employer]. States he is taking all his  meds, however several fill discrepancies with pharmacy fill reports.  ReDs reading: unable to obtain reading today.  ECG (personally reviewed): AF with RVR  Labs (6/23): K 3.7, creatinine 2.69, hgb 14.5 Labs (10/23): K 3,7, creatinine 2.13, hgb 11.1 Labs (10/24): K 3.5, creatinine 2.3 Labs (11/24): K 4.1, creatinine 2.39   Cardiac Testing  - Echo (6/23): EF 25-30%, severe LV dysfunction with global HK, normal RV  - Echo (10/24): EF 25%, RV mildly reduced   - RHC (10/24) : RA 8, PA 64/31 (43), PCWP 16, CO/CI (Fick) 7.28/2.85, PVR 3.7 WU, PAPi 4.1  - CMRI 10/24: LVEF 30%, RVEF 27%, Non-coronary pattern mid-wall LGE in the basal to mid septum, basal inferior wall, and basal inferior RV wall. Consider infiltrative disease such as cardiac sarcoidosis or prior myocarditis.   ROS: All systems reviewed and negative except as per HPI.   Past Medical History:  Diagnosis Date   Acute decompensated heart failure (HCC) 01/03/2013   Allergy    CHF (congestive heart failure) (HCC)    systolic   Chronic kidney disease    CKD (chronic kidney disease) stage 2, GFR 60-89 ml/min 01/04/2013   DM (diabetes mellitus) (HCC) 01/06/2013   Hypertension    hx htn emergency   Obesity    Other secondary pulmonary hypertension (HCC) 12/19/2021   Stroke [facility])    Current Outpatient Medications  Medication Sig Dispense Refill   acetaminophen  (TYLENOL ) 500 MG tablet Take 1,000 mg by mouth every 6 (six) hours as needed for moderate pain or headache.     amiodarone  (PACERONE ) 200 MG tablet Take 1 tablet (200 mg total) by mouth daily. 30 tablet 1   atorvastatin  (LIPITOR ) 40 MG tablet Take 1 tablet (40 mg total) by mouth daily. 30 tablet 1   carvedilol  (COREG ) 12.5 MG tablet Take 1 tablet (12.5 mg total) by mouth 2 (two) times daily with a meal. 60 tablet 1   hydrALAZINE  (APRESOLINE ) 100 MG tablet Take 1 tablet (100 mg total) by mouth 3 (three) times daily. PLEASE KEEP FOLLOW UP APPOINTMENT FOR MORE REFILLS 90  tablet 0   isosorbide  mononitrate (IMDUR ) 60 MG 24 hr tablet Take 1 tablet (60 mg total) by mouth daily. 90 tablet 3   spironolactone  (ALDACTONE ) 25 MG tablet Take 1 tablet (25 mg total) by mouth daily. NEEDS FOLLOW UP APPOINTMENT FOR MORE REFILLS 30 tablet 1   torsemide  (DEMADEX ) 20 MG tablet Take 3 tablets (60 mg total) by mouth daily. NEEDS FOLLOW UP APPOINTMENT FOR MORE REFILLS 90 tablet 1   rivaroxaban  (XARELTO ) 20 MG TABS tablet Take 1 tablet (20 mg total) by mouth daily with supper. (Patient not taking: Reported on 03/30/2024) 30 tablet 5   sacubitril -valsartan  (ENTRESTO ) 24-26 MG Take 1 tablet by mouth 2 (two) times daily. (Patient not taking: Reported on 03/30/2024)     Sotagliflozin  (INPEFA ) 200 MG TABS Take 1 tablet by mouth daily. (Patient not taking: Reported on 03/30/2024) 30 tablet 5   No current facility-administered medications for this encounter.   No Known  Allergies  Social History   Socioeconomic History   Marital status: Single    Spouse name: Not on file   Number of children: 0   Years of education: Not on file   Highest education level: Bachelor's degree (e.g., BA, AB, BS)  Occupational History   Occupation: English As A Second Language Teacher  Tobacco Use   Smoking status: Never   Smokeless tobacco: Never  Vaping Use   Vaping status: Never Used  Substance and Sexual Activity   Alcohol use: Not Currently   Drug use: No   Sexual activity: Not on file  Other Topics Concern   Not on file  Social History Narrative   Work or School: technical sales engineer, investment banker, corporate      Home Situation: lives alone      Spiritual Beliefs: Christian      Lifestyle: 15 minutes dialy walking - working up; working on diet            Social Drivers of Corporate Investment Banker Strain: Low Risk  (11/13/2021)   Overall Financial Resource Strain (CARDIA)    Difficulty of Paying Living Expenses: Not very hard  Food Insecurity: No Food Insecurity (03/04/2023)   Hunger  Vital Sign    Worried About Running Out of Food in the Last Year: Never true    Ran Out of Food in the Last Year: Never true  Transportation Needs: No Transportation Needs (03/04/2023)   PRAPARE - Administrator, Civil Service (Medical): No    Lack of Transportation (Non-Medical): No  Physical Activity: Sufficiently Active (11/24/2021)   Exercise Vital Sign    Days of Exercise per Week: 7 days    Minutes of Exercise per Session: 30 min  Stress: No Stress Concern Present (11/24/2021)   [employer] of Occupational Health - Occupational Stress Questionnaire    Feeling of Stress : Only a little  Social Connections: Moderately Integrated (11/24/2021)   Social Connection and Isolation Panel    Frequency of Communication with Friends and Family: More than three times a week    Frequency of Social Gatherings with Friends and Family: Three times a week    Attends Religious Services: More than 4 times per year    Active Member of Clubs or Organizations: Yes    Attends Banker Meetings: More than 4 times per year    Marital Status: Never married  Intimate Partner Violence: Not At Risk (03/04/2023)   Humiliation, Afraid, Rape, and Kick questionnaire    Fear of Current or Ex-Partner: No    Emotionally Abused: No    Physically Abused: No    Sexually Abused: No   Family History  Problem Relation Age of Onset   Heart disease Father 7       MI   Hypertension Father    Hyperlipidemia Father    Diabetes Mother    Hypertension Mother    BP (!) 150/100   Pulse 72   Wt (!) 145.2 kg (320 lb)   SpO2 96%   BMI 44.63 kg/m   Wt Readings from Last 3 Encounters:  03/30/24 (!) 145.2 kg (320 lb)  05/01/23 (!) 139.3 kg (307 lb)  04/11/23 (!) 140.2 kg (309 lb)   PHYSICAL EXAM: General:  NAD. No resp difficulty, walked into clinic, fatigued-appearing HEENT: Normal Neck: Supple. Thick neck, JVP 12+  Cor: Tachy irregular rate & rhythm. No rubs, gallops or murmurs. Lungs:  Clear, diminished in bases Abdomen: Obese, nontender, +  distended.  Extremities: No cyanosis, clubbing, rash, edema Neuro: Alert & oriented x 3, moves all 4 extremities w/o difficulty. Affect pleasant.  ASSESSMENT & PLAN: 1. Chronic Systolic Heart Failure: NICM, likely 2/2 poorly controlled HTN + tachymediated from rapid Afib. Echo (2014): EF 25-30%, LHC no CAD.  Echo (4/17): EF 20-25%, RV mild dilated with moderately reduced systolic function, severe LAE. SPEP negative, no M spike. UPEP negative in 2017. Echo (10/17): EF normalized, 50-55%. Echo (12/20): EF 50-55%. Echo (6/23): EF 25-30%, global HK, RV normal. In setting of new Afib w/ RVR, uncontrolled hypertension and poor compliance w/ meds. Echo (10/24): EF 25%, RV mildly reduced. Admitted 10/24 for a/c CHF in setting of dietary indiscretion and poor med compliance. Co-ox c/w low output, required inotropic support w/ milrinone  to help w/ diuresis .RHC 10/24 after diuresis and weaning off of milrinone  showed mildly elevated PCWP at 16 with moderate mixed PAH/PVH, stable CI off milrinone . cMRI 10/24 showed LV EF 30%, RV EF 27%, LGE in a pattern that could be consistent with prior myocarditis versus possible cardiac sarcoidosis. ACE level normal. No h/o lung disease and no FH of sarcoidosis. Needs Cardiac PET for further assessment to r/o sarcoidosis, unfortunately he is uninsured. Not current candidate for LHC given CKD w/ b/l SCr >2. NYHA IIb-early III, he appears volume overloaded by exam, weight up 10 lbs; likely 2/2 to AF with RVR. - Restart Inpefa  200 mg daily. - Restart Entresto  24/26 mg bid (GoodRx card). - Continue spironolactone  25 mg daily. - Continue Imdur  60 mg daily + hydralazine  100 mg tid.  - Continue Coreg  12.5 mg bid.  - Continue torsemide  60 mg daily. BMET and BNP today. - No digoxin  w/ CKD. 2. Persistent Atrial Fibrillation: Diagnosed 6/23. s/p TEE/DCCV to NSR w/ ERAF. s/p DCCV 11/30/21 to NSR. AF with RVR today on ECG, he is not  overly symptomatic so unknown how long he has been out of rhythm. - Increase amiodarone  to 200 mg bid. Check LFTs and TSH. Needs annual eye exams. Discussed with Dr. Rolan - He is uninsured and not eligible for patient assistance for DOACs. Start Coumadin 5 mg daily. Refer to Coumadin Clinic for INR checks. Personally discussed with pharmacy team.  - Followed in EP Clinic. Needs to be considered for Afib ablation.  - Needs CPAP  - If remains in AF next visit, arrange DCCV (will need INR therapeutic) and refer to EP. 3. HTN: BP un-controlled. - Restart Entresto  as above 4. CKD Stage IIIb - IV: suspect hypertensive/ DM nephropathy  - Baseline SCr 2.1-2.5   - Restart SGLT2i. BMET today. - He has been referred to [facility].  5. Type 2 DM: last A1C 8.4. - Per PCP. 6. H/o CVA: Cryptogenetic (2020). Refused loop recorder. - ? If cardioembolic, given AF detection. - Unable to get assistance for DOAC. Starting warfarin as above.  7. OSA: + sleep study 01/2023. - + sleep study 8/24. - Needs CPAP. Refer to Sleep Medicine next visit. 8. SDOH: He is uninsured. He is planning on applying for health insurance during open enrollment. - Needs PCP  Follow up in 2 weeks with APP. He is high-risk for re-admission.   Harlene HERO [place], FNP-BC  03/30/24

## 2024-03-30 ENCOUNTER — Other Ambulatory Visit (HOSPITAL_COMMUNITY): Payer: Self-pay

## 2024-03-30 ENCOUNTER — Ambulatory Visit (HOSPITAL_COMMUNITY)
Admission: RE | Admit: 2024-03-30 | Discharge: 2024-03-30 | Disposition: A | Discharge status: Home / Self Care | Payer: MEDICAID | Source: Ambulatory Visit | Attending: Family Medicine | Admitting: Family Medicine

## 2024-03-30 ENCOUNTER — Encounter (HOSPITAL_COMMUNITY): Payer: Self-pay

## 2024-03-30 VITALS — BP 150/100 | HR 72 | Wt 320.0 lb

## 2024-03-30 DIAGNOSIS — I4891 Unspecified atrial fibrillation: Secondary | ICD-10-CM | POA: Insufficient documentation

## 2024-03-30 DIAGNOSIS — Z91148 Patient's other noncompliance with medication regimen for other reason: Secondary | ICD-10-CM | POA: Insufficient documentation

## 2024-03-30 DIAGNOSIS — I10 Essential (primary) hypertension: Secondary | ICD-10-CM

## 2024-03-30 DIAGNOSIS — N1832 Chronic kidney disease, stage 3b: Secondary | ICD-10-CM | POA: Insufficient documentation

## 2024-03-30 DIAGNOSIS — Z7901 Long term (current) use of anticoagulants: Secondary | ICD-10-CM | POA: Insufficient documentation

## 2024-03-30 DIAGNOSIS — G4733 Obstructive sleep apnea (adult) (pediatric): Secondary | ICD-10-CM | POA: Insufficient documentation

## 2024-03-30 DIAGNOSIS — R0789 Other chest pain: Secondary | ICD-10-CM | POA: Insufficient documentation

## 2024-03-30 DIAGNOSIS — E8770 Fluid overload, unspecified: Secondary | ICD-10-CM | POA: Insufficient documentation

## 2024-03-30 DIAGNOSIS — Z8249 Family history of ischemic heart disease and other diseases of the circulatory system: Secondary | ICD-10-CM | POA: Insufficient documentation

## 2024-03-30 DIAGNOSIS — Z8679 Personal history of other diseases of the circulatory system: Secondary | ICD-10-CM | POA: Insufficient documentation

## 2024-03-30 DIAGNOSIS — I480 Paroxysmal atrial fibrillation: Secondary | ICD-10-CM

## 2024-03-30 DIAGNOSIS — I428 Other cardiomyopathies: Secondary | ICD-10-CM | POA: Insufficient documentation

## 2024-03-30 DIAGNOSIS — I130 Hypertensive heart and chronic kidney disease with heart failure and stage 1 through stage 4 chronic kidney disease, or unspecified chronic kidney disease: Secondary | ICD-10-CM | POA: Insufficient documentation

## 2024-03-30 DIAGNOSIS — I5042 Chronic combined systolic (congestive) and diastolic (congestive) heart failure: Secondary | ICD-10-CM | POA: Insufficient documentation

## 2024-03-30 DIAGNOSIS — Z139 Encounter for screening, unspecified: Secondary | ICD-10-CM

## 2024-03-30 DIAGNOSIS — I5022 Chronic systolic (congestive) heart failure: Secondary | ICD-10-CM | POA: Insufficient documentation

## 2024-03-30 DIAGNOSIS — E1122 Type 2 diabetes mellitus with diabetic chronic kidney disease: Secondary | ICD-10-CM | POA: Insufficient documentation

## 2024-03-30 DIAGNOSIS — Z79899 Other long term (current) drug therapy: Secondary | ICD-10-CM | POA: Insufficient documentation

## 2024-03-30 DIAGNOSIS — I4819 Other persistent atrial fibrillation: Secondary | ICD-10-CM | POA: Insufficient documentation

## 2024-03-30 DIAGNOSIS — Z5901 Sheltered homelessness: Secondary | ICD-10-CM | POA: Insufficient documentation

## 2024-03-30 DIAGNOSIS — Z8673 Personal history of transient ischemic attack (TIA), and cerebral infarction without residual deficits: Secondary | ICD-10-CM | POA: Insufficient documentation

## 2024-03-30 DIAGNOSIS — I5020 Unspecified systolic (congestive) heart failure: Secondary | ICD-10-CM

## 2024-03-30 DIAGNOSIS — Z5971 Insufficient health insurance coverage: Secondary | ICD-10-CM | POA: Insufficient documentation

## 2024-03-30 DIAGNOSIS — E119 Type 2 diabetes mellitus without complications: Secondary | ICD-10-CM

## 2024-03-30 DIAGNOSIS — N182 Chronic kidney disease, stage 2 (mild): Secondary | ICD-10-CM | POA: Insufficient documentation

## 2024-03-30 LAB — COMPREHENSIVE METABOLIC PANEL WITH GFR
ALT: 24 U/L (ref 0–44)
AST: 28 U/L (ref 15–41)
Albumin: 3.3 g/dL — ABNORMAL LOW (ref 3.5–5.0)
Alkaline Phosphatase: 33 U/L — ABNORMAL LOW (ref 38–126)
Anion gap: 16 — ABNORMAL HIGH (ref 5–15)
BUN: 20 mg/dL (ref 6–20)
CO2: 24 mmol/L (ref 22–32)
Calcium: 8.6 mg/dL — ABNORMAL LOW (ref 8.9–10.3)
Chloride: 97 mmol/L — ABNORMAL LOW (ref 98–111)
Creatinine, Ser: 2.31 mg/dL — ABNORMAL HIGH (ref 0.61–1.24)
GFR, Estimated: 33 mL/min — ABNORMAL LOW (ref >60)
Glucose, Bld: 299 mg/dL — ABNORMAL HIGH (ref 70–99)
Potassium: 3.9 mmol/L (ref 3.5–5.1)
Sodium: 137 mmol/L (ref 135–145)
Total Bilirubin: 0.8 mg/dL (ref 0.0–1.2)
Total Protein: 7.9 g/dL (ref 6.5–8.1)

## 2024-03-30 LAB — CBC
HCT: 41.6 % (ref 39.0–52.0)
Hemoglobin: 13.6 g/dL (ref 13.0–17.0)
MCH: 30.2 pg (ref 26.0–34.0)
MCHC: 32.7 g/dL (ref 30.0–36.0)
MCV: 92.2 fL (ref 80.0–100.0)
Platelets: 207 K/uL (ref 150–400)
RBC: 4.51 MIL/uL (ref 4.22–5.81)
RDW: 14.9 % (ref 11.5–15.5)
WBC: 7.6 K/uL (ref 4.0–10.5)
nRBC: 0.3 % — ABNORMAL HIGH (ref 0.0–0.2)

## 2024-03-30 LAB — IRON AND TIBC
Iron: 82 ug/dL (ref 45–182)
Saturation Ratios: 26 % (ref 17.9–39.5)
TIBC: 314 ug/dL (ref 250–450)
UIBC: 232 ug/dL

## 2024-03-30 LAB — TSH: TSH: 1.701 u[IU]/mL (ref 0.350–4.500)

## 2024-03-30 LAB — FERRITIN: Ferritin: 359 ng/mL — ABNORMAL HIGH (ref 24–336)

## 2024-03-30 LAB — BRAIN NATRIURETIC PEPTIDE: B Natriuretic Peptide: 353.7 pg/mL — ABNORMAL HIGH (ref 0.0–100.0)

## 2024-03-30 MED ORDER — WARFARIN SODIUM 5 MG PO TABS
5.0000 mg | ORAL_TABLET | Freq: Every day | ORAL | 11 refills | Status: DC
  Filled 2024-03-30: qty 30, 30d supply, fill #0

## 2024-03-30 MED ORDER — AMIODARONE HCL 200 MG PO TABS
200.0000 mg | ORAL_TABLET | Freq: Two times a day (BID) | ORAL | 6 refills | Status: DC
  Filled 2024-03-30: qty 60, 30d supply, fill #0
  Filled 2024-04-20: qty 60, 30d supply, fill #1
  Filled 2024-05-20: qty 60, 30d supply, fill #2

## 2024-03-30 MED ORDER — INPEFA 200 MG PO TABS
1.0000 | ORAL_TABLET | Freq: Every day | ORAL | 5 refills | Status: DC
  Filled 2024-03-30 (×2): qty 30, 30d supply, fill #0

## 2024-03-30 MED ORDER — TORSEMIDE 20 MG PO TABS
60.0000 mg | ORAL_TABLET | Freq: Every day | ORAL | 6 refills | Status: DC
  Filled 2024-03-30: qty 90, 30d supply, fill #0
  Filled 2024-04-20: qty 90, 30d supply, fill #1

## 2024-03-30 MED ORDER — SACUBITRIL-VALSARTAN 24-26 MG PO TABS: 1.0000 | ORAL_TABLET | Freq: Two times a day (BID) | ORAL | 11 refills | Status: DC

## 2024-03-30 MED ORDER — SPIRONOLACTONE 25 MG PO TABS
25.0000 mg | ORAL_TABLET | Freq: Every day | ORAL | 6 refills | Status: DC
  Filled 2024-03-30: qty 30, 30d supply, fill #0
  Filled 2024-04-20: qty 30, 30d supply, fill #1

## 2024-03-30 NOTE — Patient Instructions (Addendum)
 Thank you for coming in today  If you had labs drawn today, any labs that are abnormal the clinic will call you No news is good news  EKG was done today  You have been referred to coumadin clinic, their office will call you for further appointment details    Medications: START Coumadin 5 mg 1 tablet daily  RESTART Inpefa  200 mg daily  Increase Amiodarone  200 mg 1 tablet twice daily Restart Entresto  24/26 mg 1 tablet twice daily this medication is at [facility]  Follow up appointments:  Your physician recommends that you schedule a follow-up appointment in:  2-3 weeks in clinic  Your physician recommends that you return for lab work in: 04/06/2024 at 2:15 pm   Do the following things EVERYDAY: Weigh yourself in the morning before breakfast. Write it down and keep it in a log. Take your medicines as prescribed Eat low salt foods--Limit salt (sodium) to 2000 mg per day.  Stay as active as you can everyday Limit all fluids for the day to less than 2 liters   At the [facility], you and your health needs are our priority. As part of our continuing mission to provide you with exceptional heart care, we have created designated Provider Care Teams. These Care Teams include your primary Cardiologist (physician) and Advanced Practice Providers (APPs- Physician Assistants and Nurse Practitioners) who all work together to provide you with the care you need, when you need it.   You may see any of the following providers on your designated Care Team at your next follow up: Dr Toribio Fuel Dr Ezra Shuck Dr. Ria Gardenia Greig Lenetta, NP Caffie Shed, [place] [facility] [place], [place] Beckey Coe, NP Tinnie Redman, PharmD   Please be sure to bring in all your medications bottles to every appointment.    Thank you for choosing [facility]  If you have any questions or concerns before your next appointment please send  us  a message through [place] or call our office at [phone].    TO LEAVE A MESSAGE FOR THE NURSE SELECT OPTION 2, PLEASE LEAVE A MESSAGE INCLUDING: YOUR NAME DATE OF BIRTH CALL BACK NUMBER REASON FOR CALL**this is important as we prioritize the call backs  YOU WILL RECEIVE A CALL BACK THE SAME DAY AS LONG AS YOU CALL BEFORE 4:00 PM

## 2024-03-30 NOTE — Progress Notes (Signed)
 H&V Care Navigation CSW Progress Note  Clinical Social Worker met with pt to discuss lack of insurance.  Pt reports that after hospital stay last year he had to stop working his two full time jobs and lost insurance.  He has a job again now and open enrollment is coming up but believes it is $400/month which would not be affordable to him.  Provided him with [facility] insurance information to check if this would be more affordable.  Provided him with CAFA an Orange Card to help with [facility] bills and ability to see community providers.  Clinic staff assisting with med cost concerns.  CSW will continue to follow and assist as needed- will check in with pt at next appt to answer any questions  Andriette HILARIO Leech, LCSW Clinical Social Worker [facility] Desk#: [phone] Cell#: [phone]

## 2024-03-30 NOTE — Addendum Note (Signed)
 Encounter addended by: Cathern Andriette DEL, LCSW on: 03/30/2024 3:29 PM  Actions taken: Clinical Note Signed

## 2024-03-31 ENCOUNTER — Ambulatory Visit (HOSPITAL_COMMUNITY): Payer: Self-pay | Admitting: Family Medicine

## 2024-03-31 ENCOUNTER — Telehealth: Payer: Self-pay | Admitting: *Deleted

## 2024-03-31 ENCOUNTER — Other Ambulatory Visit (HOSPITAL_COMMUNITY): Payer: Self-pay | Admitting: Cardiology

## 2024-03-31 MED ORDER — INPEFA 200 MG PO TABS: 1.0000 | ORAL_TABLET | Freq: Every day | ORAL | 11 refills | Status: DC

## 2024-03-31 NOTE — Telephone Encounter (Signed)
 Called patient since he needs a new Anticoagulation Clinic Appointment, there was no answer so left a message to get scheduled. Warfarin 5mg  was sent in on yesterday by HF Clinic (30 tabs and 11 refills). Also, Amiodarone  200mg  (1 tab bid).  Also, will need to ensure that he is not still taking xarelto  since it is still on his med list.   He will be a new anticoagulation patient and will need an NEW Anticoagulation Visit appointment within 5-7 days per availability.

## 2024-04-01 ENCOUNTER — Telehealth: Payer: Self-pay | Admitting: *Deleted

## 2024-04-01 NOTE — Telephone Encounter (Signed)
 Called patient and there was no answer so left him a message that he needs to call back to schedule a NEW ANTICOAGULATION APPOINTMENT since taking a monitored medication (warfarin).   If patient calls back he needs a New Anticoagulation Clinic Appt per schedule availability.

## 2024-04-02 ENCOUNTER — Telehealth: Payer: Self-pay | Admitting: *Deleted

## 2024-04-02 NOTE — Telephone Encounter (Signed)
 Pt needs a new coumadin appointment. Called pt and lmom for pt call back.

## 2024-04-03 ENCOUNTER — Telehealth: Payer: Self-pay

## 2024-04-03 NOTE — Telephone Encounter (Signed)
 Lpmtcb to schedule New Coumadin appt

## 2024-04-06 ENCOUNTER — Telehealth: Payer: Self-pay

## 2024-04-06 ENCOUNTER — Ambulatory Visit (HOSPITAL_COMMUNITY): Payer: Self-pay

## 2024-04-06 NOTE — Telephone Encounter (Signed)
 Lpmtcb to schedule New Coumadin appt

## 2024-04-07 ENCOUNTER — Telehealth: Payer: Self-pay

## 2024-04-07 ENCOUNTER — Other Ambulatory Visit (HOSPITAL_COMMUNITY): Payer: Self-pay

## 2024-04-07 NOTE — Telephone Encounter (Addendum)
 Called [facility] pharmacy, spoke with pharmacist she states pt picked up his Warfarin rx along with other meds on 03/30/24.  Original rx was written for 30 tabs with 11 refills.  We have attempted to contact pt multiple times to schedule new pt appt in Coumadin Clinic without success.  Asked pharmacist to discontinue current rx and ask pt to contact our office for refills.  Advised we would send in a new rx for Warfarin, once we are able to get in touch with him and est him in the Coumadin Clinic so his INRs can be managed and monitored appropriately, given the dangers of being unmonitored while taking Warfarin.

## 2024-04-07 NOTE — Telephone Encounter (Signed)
 Attempted to contact pt to set up new Coumadin Clinic pt appt, left message on voicemail requesting a call back.

## 2024-04-08 NOTE — Telephone Encounter (Signed)
 Called patient since he needs a new anticoagulation appointment after being started on warfarin since 03/30/24.

## 2024-04-09 NOTE — Telephone Encounter (Signed)
 Attempted to call pt. LMOM for him to call clinic back.

## 2024-04-13 NOTE — Telephone Encounter (Signed)
 Called patient and was sent to voicemail so left a message stating to call back regarding scheduling an appointment. Also, sent a MyChart message to the patient.

## 2024-04-17 ENCOUNTER — Telehealth (HOSPITAL_COMMUNITY): Payer: Self-pay

## 2024-04-17 NOTE — Telephone Encounter (Signed)
 Called to confirm/remind patient of their appointment at the [facility] on 04/20/24.   Appointment:   [] Confirmed  [x] Left mess   [] No answer/No voice mail  [] VM Full/unable to leave message  [] Phone not in service  And  to bring in  all medications and/or complete list.

## 2024-04-20 ENCOUNTER — Encounter (HOSPITAL_COMMUNITY): Payer: Self-pay

## 2024-04-20 ENCOUNTER — Other Ambulatory Visit (HOSPITAL_COMMUNITY): Payer: Self-pay

## 2024-04-20 ENCOUNTER — Ambulatory Visit (HOSPITAL_COMMUNITY)
Admission: RE | Admit: 2024-04-20 | Discharge: 2024-04-20 | Disposition: A | Discharge status: Home / Self Care | Payer: MEDICAID | Source: Ambulatory Visit | Attending: Family Medicine | Admitting: Family Medicine

## 2024-04-20 VITALS — BP 140/82 | HR 61 | Ht 71.0 in | Wt 318.4 lb

## 2024-04-20 DIAGNOSIS — Z5971 Insufficient health insurance coverage: Secondary | ICD-10-CM | POA: Insufficient documentation

## 2024-04-20 DIAGNOSIS — Z79899 Other long term (current) drug therapy: Secondary | ICD-10-CM | POA: Insufficient documentation

## 2024-04-20 DIAGNOSIS — N1832 Chronic kidney disease, stage 3b: Secondary | ICD-10-CM | POA: Insufficient documentation

## 2024-04-20 DIAGNOSIS — E1122 Type 2 diabetes mellitus with diabetic chronic kidney disease: Secondary | ICD-10-CM | POA: Insufficient documentation

## 2024-04-20 DIAGNOSIS — Z139 Encounter for screening, unspecified: Secondary | ICD-10-CM

## 2024-04-20 DIAGNOSIS — I428 Other cardiomyopathies: Secondary | ICD-10-CM | POA: Insufficient documentation

## 2024-04-20 DIAGNOSIS — I4819 Other persistent atrial fibrillation: Secondary | ICD-10-CM | POA: Insufficient documentation

## 2024-04-20 DIAGNOSIS — I130 Hypertensive heart and chronic kidney disease with heart failure and stage 1 through stage 4 chronic kidney disease, or unspecified chronic kidney disease: Secondary | ICD-10-CM | POA: Insufficient documentation

## 2024-04-20 DIAGNOSIS — I10 Essential (primary) hypertension: Secondary | ICD-10-CM

## 2024-04-20 DIAGNOSIS — E119 Type 2 diabetes mellitus without complications: Secondary | ICD-10-CM

## 2024-04-20 DIAGNOSIS — Z8673 Personal history of transient ischemic attack (TIA), and cerebral infarction without residual deficits: Secondary | ICD-10-CM | POA: Insufficient documentation

## 2024-04-20 DIAGNOSIS — I5022 Chronic systolic (congestive) heart failure: Secondary | ICD-10-CM | POA: Insufficient documentation

## 2024-04-20 DIAGNOSIS — G4733 Obstructive sleep apnea (adult) (pediatric): Secondary | ICD-10-CM | POA: Insufficient documentation

## 2024-04-20 DIAGNOSIS — Z7901 Long term (current) use of anticoagulants: Secondary | ICD-10-CM | POA: Insufficient documentation

## 2024-04-20 LAB — BASIC METABOLIC PANEL WITH GFR
Anion gap: 16 — ABNORMAL HIGH (ref 5–15)
BUN: 38 mg/dL — ABNORMAL HIGH (ref 6–20)
CO2: 20 mmol/L — ABNORMAL LOW (ref 22–32)
Calcium: 8.9 mg/dL (ref 8.9–10.3)
Chloride: 95 mmol/L — ABNORMAL LOW (ref 98–111)
Creatinine, Ser: 2.89 mg/dL — ABNORMAL HIGH (ref 0.61–1.24)
GFR, Estimated: 25 mL/min — ABNORMAL LOW (ref >60)
Glucose, Bld: 302 mg/dL — ABNORMAL HIGH (ref 70–99)
Potassium: 4.1 mmol/L (ref 3.5–5.1)
Sodium: 131 mmol/L — ABNORMAL LOW (ref 135–145)

## 2024-04-20 LAB — PROTIME-INR
INR: 2.3 — ABNORMAL HIGH (ref 0.8–1.2)
Prothrombin Time: 26.8 s — ABNORMAL HIGH (ref 11.4–15.2)

## 2024-04-20 LAB — BRAIN NATRIURETIC PEPTIDE: B Natriuretic Peptide: 161.2 pg/mL — ABNORMAL HIGH (ref 0.0–100.0)

## 2024-04-20 NOTE — Progress Notes (Signed)
 H&V Care Navigation CSW Progress Note  Clinical Social Worker met with pt to check in regarding CAFA/Orange Card/ACA.  Pt has been unable to start applications at this time due to family issues but reports he still has them and will plan to fill out soon.  Will continue to follow and assist as needed- will plan to check in over the next few weeks to offer assistance as needed.  Andriette HILARIO Leech, LCSW Clinical Social Worker [facility] Desk#: [phone] Cell#: [phone]

## 2024-04-20 NOTE — Patient Instructions (Signed)
 Thank you for coming in today  If you had labs drawn today, any labs that are abnormal the clinic will call you No news is good news  You have been referred to  Sleep medicine and EP clinics, their office will call you for further appointment details    Medications: No changes  Follow up appointments: a coumadin clinic appointment was made for you 04/28/2024 at 2:30pm  Your physician recommends that you schedule a follow-up appointment in:  2 weeks after Cardioversion  You are scheduled for a TEE/Cardioversion/TEE Cardioversion on December 30th, 2025 with Dr. Mclean.  Please arrive at the [facility] at [facility]: [address] [place], [place] [address] at 6:30 am.   DIET: Nothing to eat or drink after midnight except a sip of water with medications (see medication instructions below)  Medication Instructions: Hold Torsemide   Continue your anticoagulant: Coumadin You will need to continue your anticoagulant after your procedure until you  are told by your  Provider that it is safe to stop    You must have a responsible person to drive you home and stay in the waiting area during your procedure. Failure to do so could result in cancellation.  Bring your insurance cards.  *Special Note: Every effort is made to have your procedure done on time. Occasionally there are emergencies that occur at the hospital that may cause delays. Please be patient if a delay does occur.     Do the following things EVERYDAY: Weigh yourself in the morning before breakfast. Write it down and keep it in a log. Take your medicines as prescribed Eat low salt foods--Limit salt (sodium) to 2000 mg per day.  Stay as active as you can everyday Limit all fluids for the day to less than 2 liters   At the [facility], you and your health needs are our priority. As part of our continuing mission to provide you with exceptional heart care, we have created  designated Provider Care Teams. These Care Teams include your primary Cardiologist (physician) and Advanced Practice Providers (APPs- Physician Assistants and Nurse Practitioners) who all work together to provide you with the care you need, when you need it.   You may see any of the following providers on your designated Care Team at your next follow up: Dr Toribio Fuel Dr Ezra Shuck Dr. Ria Gardenia Greig Lenetta, NP Caffie Shed, [place] [facility] [place], [place] Beckey Coe, NP Tinnie Redman, PharmD   Please be sure to bring in all your medications bottles to every appointment.    Thank you for choosing [place] [facility]  If you have any questions or concerns before your next appointment please send us  a message through [place] or call our office at [phone].    TO LEAVE A MESSAGE FOR THE NURSE SELECT OPTION 2, PLEASE LEAVE A MESSAGE INCLUDING: YOUR NAME DATE OF BIRTH CALL BACK NUMBER REASON FOR CALL**this is important as we prioritize the call backs  YOU WILL RECEIVE A CALL BACK THE SAME DAY AS LONG AS YOU CALL BEFORE 4:00 PM

## 2024-04-20 NOTE — Progress Notes (Addendum)
 ADVANCED HF CLINIC NOTE  Primary Care: Pcp, No EP: Dr. Inocencio HF Cardiologist: Dr. Rolan  HPI: Richard [name] is a 53 y.o.. male with type 2 diabetes, chronic combined systolic and diastolic heart failure, hypertension, chronic kidney disease stage IIla, H/o CVA, obesity and h/o poor compliance.   Echo 09/2015 EF 20-25%, RV mild dilated with moderately reduced systolic function, severe LAE.  Nonischemic cardiomyopathy, likely 2/2 poorly controlled HTN.  HIV negative, SPEP negative, no M spike. UPEP negative.  Had LHC in 2014, negative for CAD.  EF was 25-30% at that time. Improved on f/u echo in 2017. EF normalized 50-55%, GIIDD. He was being followed in the [facility] (Dr. Rolan) but lost to f/u.   In 2020, he was admitted w/ Cryptogenic stroke. 2D echo w/ normal EF, 50-55%, normal RV.  However huge discrepancy in EF by TEE. EF felt to be 20-25%, RV normal. No LV thrombus. EP was consulted for Implantable loop recorder for afib surveillance, however pt declined.    He established care w/ Dr. Santo 6/22. Seen given complaints of LEE. BP was elevated. GDMT adjusted. Losartan  switched to Entresto , spironolactone  added. He was ordered to get repeat echo w/ plans to start SLGTi at subsequent visit, but he never followed up.   Admitted 6/23 w/ a/c CHF in the setting of new atrial fibrillation w/ RVR. Echo EF back down to 25-30%, RV normal. HS trop 466>>349. LHC not perused given renal function, SCr 2.5 on admit. He was diuresed w/ IV Lasix . Placed on Eliquis  for Afib. TEE LVEF 15-20%, Aortic valve did not open with each ventricular contraction, RV systolic function severely reduced, Moderate tricuspid regurgitation, Trivial mitral regurgitation, No LA/LAA thrombus or masses. Underwent DCCV x 1 back to NSR.    Unfortunately he had ERAF. EP consulted and recommended loading w/ IV amio and reattempt DCCV after load. However pt refused to stay and opted on repeat cardioversion as an outpatient. He was  transitioned to PO amiodarone  400 mg bid x 1 week then 200 mg daily thereafter. Eliquis  continued. Transitioned to PO lasix  + GDMT (losartan , spiro, Farxiga  and coreg ). SCr 2.1 day of d/c. Repeat DCCV scheduled 11/30/21. D/c wt 314 lb.    Had outpatient f/u w/ EP on 11/21/21. Was still in Afib, 110 bpm. Amiodarone  reduced to 200 mg daily w/ plans to c/w outpatient DCCV. Labs showed SCr up to 2.78. BUN 24. K 4.5. CO2 21. He was instructed to hold Lasix  and spiro x 1 day and increase PO intake.    Seen in [facility] 6/23, NYHA II symptoms but volume up and SCr up to 2.8. Lasix  stopped, torsemide  40 mg daily started. Spiro stopped with worsening renal function and low-dose BiDil  added for afterload reduction. He was referred back to the [facility].   S/p DCCV 11/30/21 to NSR.  Admitted 9/24 w/ a/c CHF in the setting of med noncompliance and dietary indiscretion w/ sodium. He was markedly volume overloaded and diuresed w/ IV Lasix .  Echo showed EF 25%, RV mildly reduced. PICC placed and found to have low output HF by co-ox, started on milrinone  and later titrated off.  RHC 10/7 showed mildly elevated PCWP at 16 with moderate mixed PAH/PVH, stable CI off milrinone . Cardiac MRI showed LV EF 30%, RV EF 27%, LGE in a pattern that could be consistent with prior myocarditis versus possible cardiac sarcoidosis. ECV percentage was also elevated at 37%, suggesting increased myocardial fibrotic content. ACE level normal. Outpatient cardiac PET was recommended for f/u.  He was transitioned to PO torsemide  and GDMT titrated. Discharge wt 304 lbs.   Lost to follow up 04/2023.  Follow up 10/25, had not been seen in a year. Found to be in AF with RVR, not on AC. Coumadin started and referred to Coumadin Clinic. Amiodarone  increased to 200 bid, with plan for DCCV if he did not chemically convert.   Today he returns for HF follow up. Overall feeling fine. Mild SOB walking 1/2 mile into work occasionally. Occasional positional dizziness, no  falls. Denies palpitations, abnormal bleeding, CP, dizziness, edema, or PND/Orthopnea. Appetite ok. Weight at home 310 pounds. Taking all medications. No tobacco, ETOH or drugs. Works FT at [employer]. States he is taking all his meds, however several fill discrepancies with pharmacy fill reports are present.  ECG (personally reviewed): AF 92 bpm  Labs (6/23): K 3.7, creatinine 2.69, hgb 14.5 Labs (10/23): K 3,7, creatinine 2.13, hgb 11.1 Labs (10/24): K 3.5, creatinine 2.3 Labs (11/24): K 4.1, creatinine 2.39 Labs (10/25): K 3.9, creatinine 2.31, normal LFTs, normal TSH   Cardiac Testing  - Echo (6/23): EF 25-30%, severe LV dysfunction with global HK, normal RV  - Echo (10/24): EF 25%, RV mildly reduced   - RHC (10/24) : RA 8, PA 64/31 (43), PCWP 16, CO/CI (Fick) 7.28/2.85, PVR 3.7 WU, PAPi 4.1  - CMRI 10/24: LVEF 30%, RVEF 27%, Non-coronary pattern mid-wall LGE in the basal to mid septum, basal inferior wall, and basal inferior RV wall. Consider infiltrative disease such as cardiac sarcoidosis or prior myocarditis.   ROS: All systems reviewed and negative except as per HPI.   Past Medical History:  Diagnosis Date   Acute decompensated heart failure (HCC) 01/03/2013   Allergy    CHF (congestive heart failure) (HCC)    systolic   Chronic kidney disease    CKD (chronic kidney disease) stage 2, GFR 60-89 ml/min 01/04/2013   DM (diabetes mellitus) (HCC) 01/06/2013   Hypertension    hx htn emergency   Obesity    Other secondary pulmonary hypertension (HCC) 12/19/2021   Stroke [facility])    Current Outpatient Medications  Medication Sig Dispense Refill   acetaminophen  (TYLENOL ) 500 MG tablet Take 1,000 mg by mouth every 6 (six) hours as needed for moderate pain or headache.     amiodarone  (PACERONE ) 200 MG tablet Take 1 tablet (200 mg total) by mouth 2 (two) times daily. 60 tablet 6   atorvastatin  (LIPITOR ) 40 MG tablet Take 1 tablet (40 mg total) by mouth daily. 30 tablet 1    carvedilol  (COREG ) 12.5 MG tablet Take 1 tablet (12.5 mg total) by mouth 2 (two) times daily with a meal. 60 tablet 1   hydrALAZINE  (APRESOLINE ) 100 MG tablet Take 1 tablet (100 mg total) by mouth 3 (three) times daily. PLEASE KEEP FOLLOW UP APPOINTMENT FOR MORE REFILLS 90 tablet 0   isosorbide  mononitrate (IMDUR ) 60 MG 24 hr tablet Take 1 tablet (60 mg total) by mouth daily. 90 tablet 3   sacubitril -valsartan  (ENTRESTO ) 24-26 MG Take 1 tablet by mouth 2 (two) times daily. 60 tablet 11   torsemide  (DEMADEX ) 20 MG tablet Take 3 tablets (60 mg total) by mouth daily. NEEDS FOLLOW UP APPOINTMENT FOR MORE REFILLS 90 tablet 6   rivaroxaban  (XARELTO ) 20 MG TABS tablet Take 1 tablet (20 mg total) by mouth daily with supper. (Patient not taking: Reported on 04/20/2024) 30 tablet 5   Sotagliflozin  (INPEFA ) 200 MG TABS Take 1 tablet by mouth daily. (Patient not taking:  Reported on 04/20/2024) 30 tablet 11   spironolactone  (ALDACTONE ) 25 MG tablet Take 1 tablet (25 mg total) by mouth daily. NEEDS FOLLOW UP APPOINTMENT FOR MORE REFILLS 30 tablet 6   No current facility-administered medications for this encounter.   No Known Allergies  Social History   Socioeconomic History   Marital status: Single    Spouse name: Not on file   Number of children: 0   Years of education: Not on file   Highest education level: Bachelor's degree (e.g., BA, AB, BS)  Occupational History   Occupation: English As A Second Language Teacher  Tobacco Use   Smoking status: Never   Smokeless tobacco: Never  Vaping Use   Vaping status: Never Used  Substance and Sexual Activity   Alcohol use: Not Currently   Drug use: No   Sexual activity: Not on file  Other Topics Concern   Not on file  Social History Narrative   Work or School: technical sales engineer, investment banker, corporate      Home Situation: lives alone      Spiritual Beliefs: Christian      Lifestyle: 15 minutes dialy walking - working up; working on diet             Social Drivers of Corporate Investment Banker Strain: Low Risk  (11/13/2021)   Overall Financial Resource Strain (CARDIA)    Difficulty of Paying Living Expenses: Not very hard  Food Insecurity: No Food Insecurity (03/04/2023)   Hunger Vital Sign    Worried About Running Out of Food in the Last Year: Never true    Ran Out of Food in the Last Year: Never true  Transportation Needs: No Transportation Needs (03/04/2023)   PRAPARE - Administrator, Civil Service (Medical): No    Lack of Transportation (Non-Medical): No  Physical Activity: Sufficiently Active (11/24/2021)   Exercise Vital Sign    Days of Exercise per Week: 7 days    Minutes of Exercise per Session: 30 min  Stress: No Stress Concern Present (11/24/2021)   [employer] of Occupational Health - Occupational Stress Questionnaire    Feeling of Stress : Only a little  Social Connections: Moderately Integrated (11/24/2021)   Social Connection and Isolation Panel    Frequency of Communication with Friends and Family: More than three times a week    Frequency of Social Gatherings with Friends and Family: Three times a week    Attends Religious Services: More than 4 times per year    Active Member of Clubs or Organizations: Yes    Attends Banker Meetings: More than 4 times per year    Marital Status: Never married  Intimate Partner Violence: Not At Risk (03/04/2023)   Humiliation, Afraid, Rape, and Kick questionnaire    Fear of Current or Ex-Partner: No    Emotionally Abused: No    Physically Abused: No    Sexually Abused: No   Family History  Problem Relation Age of Onset   Heart disease Father 87       MI   Hypertension Father    Hyperlipidemia Father    Diabetes Mother    Hypertension Mother    BP (!) 140/82   Pulse 61   Ht 5' 11 (1.803 m)   Wt (!) 144.4 kg (318 lb 6.4 oz)   SpO2 96%   BMI 44.41 kg/m   Wt Readings from Last 3 Encounters:  04/20/24 (!) 144.4 kg (318 lb 6.4 oz)  03/30/24 (!) 145.2 kg (320 lb)  05/01/23 (!) 139.3 kg (307 lb)   PHYSICAL EXAM: General:  NAD. No resp difficulty, walked into clinic HEENT: Normal Neck: Supple. No JVD. Thick neck Cor: Irregular rate & rhythm. No rubs, gallops or murmurs. Lungs: Clear Abdomen: Soft, obese, nontender, nondistended.  Extremities: No cyanosis, clubbing, rash, edema Neuro: Alert & oriented x 3, moves all 4 extremities w/o difficulty. Affect pleasant.  ASSESSMENT & PLAN: 1. Chronic Systolic Heart Failure: NICM, likely 2/2 poorly controlled HTN + tachymediated from rapid Afib. Echo (2014): EF 25-30%, LHC no CAD.  Echo (4/17): EF 20-25%, RV mild dilated with moderately reduced systolic function, severe LAE. SPEP negative, no M spike. UPEP negative in 2017. Echo (10/17): EF normalized, 50-55%. Echo (12/20): EF 50-55%. Echo (6/23): EF 25-30%, global HK, RV normal. In setting of new Afib w/ RVR, uncontrolled hypertension and poor compliance w/ meds. Echo (10/24): EF 25%, RV mildly reduced. Admitted 10/24 for a/c CHF in setting of dietary indiscretion and poor med compliance. Co-ox c/w low output, required inotropic support w/ milrinone  to help w/ diuresis .RHC 10/24 after diuresis and weaning off of milrinone  showed mildly elevated PCWP at 16 with moderate mixed PAH/PVH, stable CI off milrinone . cMRI 10/24 showed LV EF 30%, RV EF 27%, LGE in a pattern that could be consistent with prior myocarditis versus possible cardiac sarcoidosis. ACE level normal. No h/o lung disease and no FH of sarcoidosis. Needs Cardiac PET for further assessment to r/o sarcoidosis, unfortunately he is uninsured. Not current candidate for LHC given CKD w/ b/l SCr >2. NYHA II, volume difficult due to body habitus, he is mildly volume overloaded. GDMT limited by CKD.  - Unable to qualify for SGLT2i assistance. Plan to give samples next visit until he is able to obtain health insurance through his work place (see below) - Continue Entresto  24/26 mg bid  (GoodRx card). - Continue spironolactone  25 mg daily. - Continue Imdur  60 mg daily + hydralazine  100 mg tid.  - Continue Coreg  12.5 mg bid.  - Continue torsemide  60 mg daily. BMET and BNP today. - No digoxin  w/ CKD. 2. Persistent Atrial Fibrillation: Diagnosed 6/23. s/p TEE/DCCV to NSR w/ ERAF. s/p DCCV 11/30/21 to NSR. AF with controlled VR today on ECG. He is not overly symptomatic. - Continue amiodarone  200 mg bid. Needs annual eye exams.  - He is uninsured and not eligible for patient assistance for DOACs. Continue Coumadin through the Clinic. INR followed by Coumadin Clinic, will arrange appt with them for 5-7 days (they have had trouble contacting him, I asked him to set up voice mailbox).  - Remains in AF, arrange DCCV (will need INR therapeutic). Check PT/INR -  Informed Consent   Shared Decision Making/Informed Consent The risks (stroke, cardiac arrhythmias rarely resulting in the need for a temporary or permanent pacemaker, skin irritation or burns and complications associated with conscious sedation including aspiration, arrhythmia, respiratory failure and death), benefits (restoration of normal sinus rhythm) and alternatives of a direct current cardioversion were explained in detail to Mr. Shuck and he agrees to proceed.      - Followed in EP Clinic, will need to get back in with them. Needs to be considered for Afib ablation. Not ideal Tikosyn candidate with history of AKI and non-compliance. - Needs CPAP  3. HTN: BP improving - BMET today. - Needs CPAP 4. CKD Stage IIIb - IV: suspect hypertensive/ DM nephropathy.  - Baseline SCr 2.1-2.5 . BMET today. - He has  been referred to [facility].  5. Type 2 DM: last A1C 8.4. - Per PCP. 6. H/o CVA: Cryptogenetic (2020). Refused loop recorder. - ? If cardioembolic, given AF detection. - Unable to get assistance for DOAC. On warfarin as above.  7. OSA: + sleep study 01/2023. - + sleep study 8/24. - Needs CPAP. Refer to Sleep Medicine. 8.  SDOH: He is uninsured. He is planning on applying for health insurance during open enrollment. - Needs PCP - Meds thru HF fund, difficult situation; unclear if he is taking meds as directed. Would be a good paramedicine candidate but not [facility]  Arrange DCCV with Dr. Rolan. Follow up with APP 2 weeks after cardioversion.  Harlene HERO [place], FNP-BC  04/20/24

## 2024-04-21 ENCOUNTER — Other Ambulatory Visit (HOSPITAL_COMMUNITY): Payer: Self-pay

## 2024-04-21 ENCOUNTER — Ambulatory Visit (HOSPITAL_COMMUNITY): Payer: Self-pay | Admitting: Family Medicine

## 2024-04-21 DIAGNOSIS — I5022 Chronic systolic (congestive) heart failure: Secondary | ICD-10-CM

## 2024-04-22 ENCOUNTER — Other Ambulatory Visit (HOSPITAL_COMMUNITY): Payer: Self-pay

## 2024-04-22 MED ORDER — TORSEMIDE 20 MG PO TABS: 40.0000 mg | ORAL_TABLET | Freq: Every day | ORAL | 6 refills | Status: AC

## 2024-04-22 NOTE — Telephone Encounter (Signed)
 Pt aware.

## 2024-04-28 ENCOUNTER — Other Ambulatory Visit (HOSPITAL_COMMUNITY): Payer: Self-pay

## 2024-04-28 ENCOUNTER — Ambulatory Visit: Payer: MEDICAID | Admitting: *Deleted

## 2024-04-28 DIAGNOSIS — I5022 Chronic systolic (congestive) heart failure: Secondary | ICD-10-CM

## 2024-04-28 DIAGNOSIS — Z5181 Encounter for therapeutic drug level monitoring: Secondary | ICD-10-CM | POA: Insufficient documentation

## 2024-04-28 DIAGNOSIS — I480 Paroxysmal atrial fibrillation: Secondary | ICD-10-CM

## 2024-04-28 LAB — POCT INR: INR: 3.5 — AB (ref 2.0–3.0)

## 2024-04-28 MED ORDER — WARFARIN SODIUM 5 MG PO TABS
5.0000 mg | ORAL_TABLET | Freq: Every day | ORAL | 1 refills | Status: DC
  Filled 2024-04-28: qty 30, 30d supply, fill #0
  Filled 2024-05-20: qty 30, 30d supply, fill #1

## 2024-04-28 NOTE — Patient Instructions (Addendum)
 A full discussion of the nature of anticoagulants has been carried out.  A benefit risk analysis has been presented to the patient, so that they understand the justification for choosing anticoagulation at this time. The need for frequent and regular monitoring, precise dosage adjustment and compliance is stressed.  Side effects of potential bleeding are discussed.  The patient should avoid any OTC items containing aspirin  or ibuprofen, and should avoid great swings in general diet.  Avoid alcohol consumption.  Call if any signs of abnormal bleeding.    Description   INR-3.5; Do not take any warfarin tomorrow then START 1 tablet daily except 1/2 tablet on Tuesdays. Recheck INR in 1 week.  Please call Anticoagulation Clinic with any new medications at [phone]

## 2024-04-28 NOTE — Progress Notes (Signed)
 Description   INR-3.5; Do not take any warfarin tomorrow then START 1 tablet daily except 1/2 tablet on Tuesdays. Recheck INR in 1 week.  Please call Anticoagulation Clinic with any new medications at [phone]

## 2024-04-29 ENCOUNTER — Other Ambulatory Visit (HOSPITAL_COMMUNITY): Payer: Self-pay

## 2024-04-29 ENCOUNTER — Ambulatory Visit (HOSPITAL_COMMUNITY)
Admission: RE | Admit: 2024-04-29 | Discharge: 2024-04-29 | Disposition: A | Discharge status: Home / Self Care | Payer: MEDICAID | Source: Ambulatory Visit | Attending: Cardiology | Admitting: Cardiology

## 2024-04-29 DIAGNOSIS — I5022 Chronic systolic (congestive) heart failure: Secondary | ICD-10-CM | POA: Insufficient documentation

## 2024-04-29 LAB — BASIC METABOLIC PANEL WITH GFR
Anion gap: 12 (ref 5–15)
BUN: 46 mg/dL — ABNORMAL HIGH (ref 6–20)
CO2: 19 mmol/L — ABNORMAL LOW (ref 22–32)
Calcium: 8.8 mg/dL — ABNORMAL LOW (ref 8.9–10.3)
Chloride: 101 mmol/L (ref 98–111)
Creatinine, Ser: 3.17 mg/dL — ABNORMAL HIGH (ref 0.61–1.24)
GFR, Estimated: 23 mL/min — ABNORMAL LOW (ref >60)
Glucose, Bld: 318 mg/dL — ABNORMAL HIGH (ref 70–99)
Potassium: 4 mmol/L (ref 3.5–5.1)
Sodium: 132 mmol/L — ABNORMAL LOW (ref 135–145)

## 2024-04-29 MED ORDER — SACUBITRIL-VALSARTAN 24-26 MG PO TABS: 1.0000 | ORAL_TABLET | Freq: Two times a day (BID) | ORAL | 11 refills | Status: DC

## 2024-05-01 ENCOUNTER — Ambulatory Visit (HOSPITAL_COMMUNITY): Payer: Self-pay | Admitting: Family Medicine

## 2024-05-01 DIAGNOSIS — I5022 Chronic systolic (congestive) heart failure: Secondary | ICD-10-CM

## 2024-05-04 NOTE — Telephone Encounter (Signed)
 Pt aware.

## 2024-05-06 ENCOUNTER — Ambulatory Visit: Payer: MEDICAID | Attending: Family Medicine | Admitting: Pharmacist

## 2024-05-06 DIAGNOSIS — Z5181 Encounter for therapeutic drug level monitoring: Secondary | ICD-10-CM

## 2024-05-06 DIAGNOSIS — I480 Paroxysmal atrial fibrillation: Secondary | ICD-10-CM

## 2024-05-06 LAB — POCT INR: INR: 2.1 (ref 2.0–3.0)

## 2024-05-06 NOTE — Patient Instructions (Signed)
 Description   INR-2.1; 1 tablet daily except 1/2 tablet on Tuesdays. Recheck INR in 1 week.  Please call Anticoagulation Clinic with any new medications at [phone]

## 2024-05-06 NOTE — Progress Notes (Signed)
 Description   INR-2.1; 1 tablet daily except 1/2 tablet on Tuesdays. Recheck INR in 1 week.  Please call Anticoagulation Clinic with any new medications at [phone]

## 2024-05-11 ENCOUNTER — Telehealth (HOSPITAL_COMMUNITY): Payer: Self-pay | Admitting: Licensed Clinical Social Worker

## 2024-05-11 ENCOUNTER — Ambulatory Visit (HOSPITAL_COMMUNITY): Payer: Self-pay

## 2024-05-11 NOTE — Telephone Encounter (Signed)
 CSW attempted to contact patient regarding progress with [employer] applications- unable to reach- left VM requesting return call  Andriette HILARIO Leech, LCSW Clinical Social Worker [facility] Desk#: [phone] Cell#: [phone]

## 2024-05-12 ENCOUNTER — Ambulatory Visit (HOSPITAL_COMMUNITY): Payer: Self-pay

## 2024-05-13 ENCOUNTER — Ambulatory Visit: Payer: MEDICAID

## 2024-05-13 ENCOUNTER — Ambulatory Visit: Payer: MEDICAID | Attending: Family Medicine | Admitting: *Deleted

## 2024-05-13 DIAGNOSIS — I480 Paroxysmal atrial fibrillation: Secondary | ICD-10-CM

## 2024-05-13 DIAGNOSIS — Z5181 Encounter for therapeutic drug level monitoring: Secondary | ICD-10-CM

## 2024-05-13 LAB — POCT INR: INR: 1.9 — AB (ref 2.0–3.0)

## 2024-05-13 NOTE — Patient Instructions (Signed)
 Description   INR-1.9; Today take 1.5 tablets today then START taking 1 tablet daily. Recheck INR in 1 week.  Please call Anticoagulation Clinic with any new medications at [phone]

## 2024-05-13 NOTE — Progress Notes (Signed)
 Description   INR-1.9; Today take 1.5 tablets today then START taking 1 tablet daily. Recheck INR in 1 week.  Please call Anticoagulation Clinic with any new medications at [phone]

## 2024-05-20 ENCOUNTER — Other Ambulatory Visit (HOSPITAL_COMMUNITY): Payer: Self-pay

## 2024-05-20 ENCOUNTER — Other Ambulatory Visit: Payer: Self-pay

## 2024-05-20 ENCOUNTER — Other Ambulatory Visit (HOSPITAL_COMMUNITY): Payer: Self-pay | Admitting: Cardiology

## 2024-05-20 ENCOUNTER — Ambulatory Visit: Payer: MEDICAID | Attending: Family Medicine

## 2024-05-20 DIAGNOSIS — I639 Cerebral infarction, unspecified: Secondary | ICD-10-CM

## 2024-05-20 DIAGNOSIS — I480 Paroxysmal atrial fibrillation: Secondary | ICD-10-CM

## 2024-05-20 DIAGNOSIS — Z5181 Encounter for therapeutic drug level monitoring: Secondary | ICD-10-CM

## 2024-05-20 LAB — POCT INR: INR: 2.8 (ref 2.0–3.0)

## 2024-05-20 MED ORDER — HYDRALAZINE HCL 100 MG PO TABS
100.0000 mg | ORAL_TABLET | Freq: Three times a day (TID) | ORAL | 0 refills | Status: DC
  Filled 2024-05-20: qty 90, 30d supply, fill #0

## 2024-05-20 MED ORDER — CARVEDILOL 12.5 MG PO TABS
12.5000 mg | ORAL_TABLET | Freq: Two times a day (BID) | ORAL | 1 refills | Status: AC
  Filled 2024-05-20: qty 60, 30d supply, fill #0
  Filled 2024-06-23 – 2024-07-09 (×2): qty 60, 30d supply, fill #1

## 2024-05-20 MED ORDER — ATORVASTATIN CALCIUM 40 MG PO TABS
40.0000 mg | ORAL_TABLET | Freq: Every day | ORAL | 1 refills | Status: AC
  Filled 2024-05-20: qty 30, 30d supply, fill #0
  Filled 2024-06-23 – 2024-07-09 (×2): qty 30, 30d supply, fill #1

## 2024-05-20 NOTE — Progress Notes (Signed)
 Description   INR-2.8; Continue taking warfarin 1 tablet daily. Recheck INR in 1 week.  Please call Anticoagulation Clinic with any new medications at [phone]

## 2024-05-20 NOTE — Patient Instructions (Signed)
 Description   INR-2.8; Continue taking warfarin 1 tablet daily. Recheck INR in 1 week.  Please call Anticoagulation Clinic with any new medications at [phone]

## 2024-05-23 ENCOUNTER — Other Ambulatory Visit (HOSPITAL_COMMUNITY): Payer: Self-pay

## 2024-05-25 ENCOUNTER — Ambulatory Visit: Payer: MEDICAID

## 2024-05-26 ENCOUNTER — Ambulatory Visit: Payer: MEDICAID

## 2024-05-26 ENCOUNTER — Ambulatory Visit: Payer: MEDICAID | Attending: Family Medicine | Admitting: *Deleted

## 2024-05-26 ENCOUNTER — Other Ambulatory Visit (HOSPITAL_COMMUNITY): Payer: Self-pay

## 2024-05-26 DIAGNOSIS — I480 Paroxysmal atrial fibrillation: Secondary | ICD-10-CM

## 2024-05-26 DIAGNOSIS — Z5181 Encounter for therapeutic drug level monitoring: Secondary | ICD-10-CM

## 2024-05-26 LAB — POCT INR: INR: 2.5 (ref 2.0–3.0)

## 2024-05-26 NOTE — Progress Notes (Signed)
 Description   INR-2.5; Continue taking warfarin 1 tablet daily. Recheck INR in 1 week-pending dccv on 06/02/24.   Please call Anticoagulation Clinic with any new medications at [phone]

## 2024-05-26 NOTE — Patient Instructions (Signed)
 Description   INR-2.5; Continue taking warfarin 1 tablet daily. Recheck INR in 1 week-pending dccv on 06/02/24.   Please call Anticoagulation Clinic with any new medications at [phone]

## 2024-05-29 ENCOUNTER — Ambulatory Visit (HOSPITAL_COMMUNITY): Payer: Self-pay

## 2024-06-01 ENCOUNTER — Ambulatory Visit: Payer: MEDICAID | Attending: Family Medicine

## 2024-06-02 ENCOUNTER — Encounter (HOSPITAL_COMMUNITY)
Admission: RE | Disposition: A | Discharge status: Home / Self Care | Payer: Self-pay | Source: Home / Self Care | Attending: Cardiology

## 2024-06-02 ENCOUNTER — Ambulatory Visit (HOSPITAL_COMMUNITY)
Admission: RE | Admit: 2024-06-02 | Discharge: 2024-06-02 | Disposition: A | Discharge status: Home / Self Care | Payer: MEDICAID | Attending: Cardiology | Admitting: Cardiology

## 2024-06-02 ENCOUNTER — Other Ambulatory Visit (HOSPITAL_COMMUNITY): Payer: Self-pay

## 2024-06-02 ENCOUNTER — Encounter (HOSPITAL_COMMUNITY): Payer: Self-pay | Admitting: Cardiology

## 2024-06-02 ENCOUNTER — Other Ambulatory Visit: Payer: Self-pay

## 2024-06-02 ENCOUNTER — Ambulatory Visit (HOSPITAL_COMMUNITY): Payer: Self-pay

## 2024-06-02 DIAGNOSIS — Q248 Other specified congenital malformations of heart: Secondary | ICD-10-CM | POA: Insufficient documentation

## 2024-06-02 DIAGNOSIS — I429 Cardiomyopathy, unspecified: Secondary | ICD-10-CM | POA: Insufficient documentation

## 2024-06-02 DIAGNOSIS — I3139 Other pericardial effusion (noninflammatory): Secondary | ICD-10-CM | POA: Insufficient documentation

## 2024-06-02 DIAGNOSIS — I4891 Unspecified atrial fibrillation: Secondary | ICD-10-CM

## 2024-06-02 DIAGNOSIS — I4819 Other persistent atrial fibrillation: Secondary | ICD-10-CM | POA: Insufficient documentation

## 2024-06-02 DIAGNOSIS — I480 Paroxysmal atrial fibrillation: Secondary | ICD-10-CM

## 2024-06-02 DIAGNOSIS — Z79899 Other long term (current) drug therapy: Secondary | ICD-10-CM | POA: Insufficient documentation

## 2024-06-02 DIAGNOSIS — E1122 Type 2 diabetes mellitus with diabetic chronic kidney disease: Secondary | ICD-10-CM | POA: Insufficient documentation

## 2024-06-02 DIAGNOSIS — N1832 Chronic kidney disease, stage 3b: Secondary | ICD-10-CM

## 2024-06-02 DIAGNOSIS — Z7901 Long term (current) use of anticoagulants: Secondary | ICD-10-CM | POA: Insufficient documentation

## 2024-06-02 DIAGNOSIS — I5023 Acute on chronic systolic (congestive) heart failure: Secondary | ICD-10-CM

## 2024-06-02 DIAGNOSIS — I340 Nonrheumatic mitral (valve) insufficiency: Secondary | ICD-10-CM | POA: Insufficient documentation

## 2024-06-02 DIAGNOSIS — N182 Chronic kidney disease, stage 2 (mild): Secondary | ICD-10-CM | POA: Insufficient documentation

## 2024-06-02 DIAGNOSIS — I5022 Chronic systolic (congestive) heart failure: Secondary | ICD-10-CM | POA: Insufficient documentation

## 2024-06-02 DIAGNOSIS — I130 Hypertensive heart and chronic kidney disease with heart failure and stage 1 through stage 4 chronic kidney disease, or unspecified chronic kidney disease: Secondary | ICD-10-CM | POA: Insufficient documentation

## 2024-06-02 HISTORY — PX: CARDIOVERSION: EP1203

## 2024-06-02 HISTORY — PX: TRANSESOPHAGEAL ECHOCARDIOGRAM (CATH LAB): EP1270

## 2024-06-02 LAB — ECHO TEE

## 2024-06-02 LAB — POCT I-STAT, CHEM 8
BUN: 31 mg/dL — ABNORMAL HIGH (ref 6–20)
Calcium, Ion: 1.15 mmol/L (ref 1.15–1.40)
Chloride: 99 mmol/L (ref 98–111)
Creatinine, Ser: 2.5 mg/dL — ABNORMAL HIGH (ref 0.61–1.24)
Glucose, Bld: 292 mg/dL — ABNORMAL HIGH (ref 70–99)
HCT: 43 % (ref 39.0–52.0)
Hemoglobin: 14.6 g/dL (ref 13.0–17.0)
Potassium: 3.8 mmol/L (ref 3.5–5.1)
Sodium: 139 mmol/L (ref 135–145)
TCO2: 25 mmol/L (ref 22–32)

## 2024-06-02 LAB — PROTIME-INR
INR: 2.5 — ABNORMAL HIGH (ref 0.8–1.2)
Prothrombin Time: 27.9 s — ABNORMAL HIGH (ref 11.4–15.2)

## 2024-06-02 SURGERY — CARDIOVERSION (CATH LAB)
Anesthesia: General

## 2024-06-02 MED ORDER — DEXMEDETOMIDINE HCL IN NACL 80 MCG/20ML IV SOLN
INTRAVENOUS | Status: DC | PRN
  Administered 2024-06-02 (×3): 10 ug via INTRAVENOUS

## 2024-06-02 MED ORDER — PROPOFOL 10 MG/ML IV BOLUS
INTRAVENOUS | Status: DC | PRN
  Administered 2024-06-02: 100 ug/kg/min via INTRAVENOUS

## 2024-06-02 MED ORDER — LIDOCAINE 2% (20 MG/ML) 5 ML SYRINGE
INTRAMUSCULAR | Status: DC | PRN
  Administered 2024-06-02 (×2): 50 mg via INTRAVENOUS

## 2024-06-02 MED ORDER — SODIUM CHLORIDE 0.9 % IV SOLN: 250.0000 mL | INTRAVENOUS | Status: DC | PRN

## 2024-06-02 MED ORDER — SODIUM CHLORIDE 0.9% FLUSH: 3.0000 mL | Freq: Two times a day (BID) | INTRAVENOUS | Status: DC

## 2024-06-02 MED ORDER — AMIODARONE HCL 200 MG PO TABS: 200.0000 mg | ORAL_TABLET | Freq: Every day | ORAL | 6 refills | Status: AC

## 2024-06-02 MED ORDER — SODIUM CHLORIDE 0.9% FLUSH: 3.0000 mL | INTRAVENOUS | Status: DC | PRN

## 2024-06-02 SURGICAL SUPPLY — 1 items: PAD DEFIB RADIO PHYSIO CONN (PAD) ×1 IMPLANT

## 2024-06-02 NOTE — Anesthesia Preprocedure Evaluation (Addendum)
"                                    Anesthesia Evaluation  Patient identified by MRN, date of birth, ID band Patient awake    Reviewed: Allergy & Precautions, H&P , NPO status , Patient's Chart, lab work & pertinent test results  History of Anesthesia Complications Negative for: history of anesthetic complications  Airway Mallampati: II  TM Distance: >3 FB Neck ROM: Full    Dental no notable dental hx.    Pulmonary neg pulmonary ROS   Pulmonary exam normal breath sounds clear to auscultation       Cardiovascular hypertension, pulmonary hypertension+CHF  Normal cardiovascular exam+ dysrhythmias Atrial Fibrillation  Rhythm:Regular Rate:Normal  03/2023: IMPRESSIONS     1. Left ventricular ejection fraction, by estimation, is 25%. The left  ventricle has severely decreased function. The left ventricle demonstrates  global hypokinesis. The left ventricular internal cavity size was  moderately dilated. Left ventricular  diastolic parameters are consistent with Grade II diastolic dysfunction  (pseudonormalization).   2. Right ventricular systolic function is mildly reduced. The right  ventricular size is normal. There is moderately elevated pulmonary artery  systolic pressure. The estimated right ventricular systolic pressure is  45.9 mmHg.   3. Left atrial size was mildly dilated.   4. Right atrial size was mildly dilated.   5. The mitral valve is normal in structure. Trivial mitral valve  regurgitation. No evidence of mitral stenosis.   6. The aortic valve is tricuspid. There is mild calcification of the  aortic valve. Aortic valve regurgitation is not visualized. No aortic  stenosis is present.   7. The inferior vena cava is dilated in size with <50% respiratory  variability, suggesting right atrial pressure of 15 mmHg.   8. Technically difficult study with very poor images.      Neuro/Psych CVA  negative psych ROS   GI/Hepatic negative GI ROS, Neg liver  ROS,,,  Endo/Other  diabetes, Type 2    Renal/GU CRFRenal disease  negative genitourinary   Musculoskeletal negative musculoskeletal ROS (+)    Abdominal   Peds negative pediatric ROS (+)  Hematology negative hematology ROS (+)   Anesthesia Other Findings   Reproductive/Obstetrics negative OB ROS                              Anesthesia Physical Anesthesia Plan  ASA: 3  Anesthesia Plan: General   Post-op Pain Management:    Induction: Intravenous  PONV Risk Score and Plan: 2 and Treatment may vary due to age or medical condition  Airway Management Planned: Natural Airway and Simple Face Mask  Additional Equipment: None  Intra-op Plan:   Post-operative Plan:   Informed Consent: I have reviewed the patients History and Physical, chart, labs and discussed the procedure including the risks, benefits and alternatives for the proposed anesthesia with the patient or authorized representative who has indicated his/her understanding and acceptance.     Dental advisory given  Plan Discussed with: CRNA  Anesthesia Plan Comments:          Anesthesia Quick Evaluation  "

## 2024-06-02 NOTE — Anesthesia Postprocedure Evaluation (Signed)
"   Anesthesia Post Note  Patient: Richard [name]  Procedure(s) Performed: CARDIOVERSION TRANSESOPHAGEAL ECHOCARDIOGRAM     Patient location during evaluation: PACU Anesthesia Type: MAC Level of consciousness: awake and alert Pain management: pain level controlled Vital Signs Assessment: post-procedure vital signs reviewed and stable Respiratory status: spontaneous breathing, nonlabored ventilation, respiratory function stable and patient connected to nasal cannula oxygen Cardiovascular status: stable and blood pressure returned to baseline Postop Assessment: no apparent nausea or vomiting Anesthetic complications: no   No notable events documented.  Last Vitals:  Vitals:   06/02/24 0840 06/02/24 0845  BP: 107/78 (!) 114/104  Pulse: 85 71  Resp: 19 (!) 21  Temp:    SpO2: 94% 93%    Last Pain:  Vitals:   06/02/24 0845  TempSrc:   PainSc: 0-No pain                 Thom [name]      "

## 2024-06-02 NOTE — Transfer of Care (Signed)
 Immediate Anesthesia Transfer of Care Note  Patient: Richard [name]  Procedure(s) Performed: CARDIOVERSION TRANSESOPHAGEAL ECHOCARDIOGRAM  Patient Location: Cath Lab  Anesthesia Type:General  Level of Consciousness: drowsy  Airway & Oxygen Therapy: Patient Spontanous Breathing and Patient connected to face mask oxygen  Post-op Assessment: Report given to RN and Post -op Vital signs reviewed and stable  Post vital signs: Reviewed and stable  Last Vitals:  Vitals Value Taken Time  BP 111/90 06/02/24 08:13  Temp    Pulse 73 06/02/24 08:14  Resp 7 06/02/24 08:14  SpO2 94 % 06/02/24 08:14  Vitals shown include unfiled device data.  Last Pain:  Vitals:   06/02/24 0647  TempSrc: Temporal         Complications: No notable events documented.

## 2024-06-02 NOTE — Progress Notes (Signed)
" °  Echocardiogram Echocardiogram Transesophageal has been performed.  Koleen KANDICE Popper, RDCS 06/02/2024, 8:09 AM "

## 2024-06-02 NOTE — Procedures (Signed)
 Electrical Cardioversion Procedure Note Richard [name] [id number] 11-24-1970  Procedure: Electrical Cardioversion Indications:  Atrial Fibrillation  Procedure Details Consent: Risks of procedure as well as the alternatives and risks of each were explained to the (patient/caregiver).  Consent for procedure obtained. Time Out: Verified patient identification, verified procedure, site/side was marked, verified correct patient position, special equipment/implants available, medications/allergies/relevent history reviewed, required imaging and test results available.  Performed  Patient placed on cardiac monitor, pulse oximetry, supplemental oxygen as necessary.  Sedation given: Propofol  per anesthesiology Pacer pads placed anterior and posterior chest.  Cardioverted 3 times with sternal pressure.  Cardioverted at 360J.  Evaluation Findings: Post procedure EKG shows: Atrial Fibrillation Complications: None Patient did tolerate procedure well.   Richard [name] 06/02/2024, 8:06 AM

## 2024-06-02 NOTE — H&P (Signed)
 "   Advanced Heart Failure Team History and Physical Note   PCP:  Pcp, No  PCP-Cardiology: Stanly DELENA Leavens, MD     Reason for Admission: AF/cardioversion HPI:    Richard [name] is a 53 y.o. male with history of CHF and atrial fibrillation.  Plan for DCCV today, he remains in AF.  However, had subtherapeutic INR 2 wks ago so will need TEE also.  INR 2.5 today.    Home Medications Prior to Admission medications  Medication Sig Start Date End Date Taking? Authorizing Provider  amiodarone  (PACERONE ) 200 MG tablet Take 1 tablet (200 mg total) by mouth 2 (two) times daily. 03/30/24  Yes Milford, Harlene HERO, FNP  atorvastatin  (LIPITOR ) 40 MG tablet Take 1 tablet (40 mg total) by mouth daily. 05/20/24 06/25/24 Yes Rolan Ezra RAMAN, MD  carvedilol  (COREG ) 12.5 MG tablet Take 1 tablet (12.5 mg total) by mouth 2 (two) times daily with a meal. 05/20/24 06/25/24 Yes Rolan Ezra RAMAN, MD  hydrALAZINE  (APRESOLINE ) 100 MG tablet Take 1 tablet (100 mg total) by mouth 3 (three) times daily. 05/20/24  Yes Rolan Ezra RAMAN, MD  isosorbide  mononitrate (IMDUR ) 60 MG 24 hr tablet Take 1 tablet (60 mg total) by mouth daily. 05/01/23 06/01/24 Yes Hayes Beckey CROME, NP  sacubitril -valsartan  (ENTRESTO ) 24-26 MG Take 1 tablet by mouth 2 (two) times daily. 04/29/24  Yes Rolan Ezra RAMAN, MD  torsemide  (DEMADEX ) 20 MG tablet Take 2 tablets (40 mg total) by mouth daily. 04/22/24 06/01/24 Yes Milford, Harlene HERO, FNP  warfarin (COUMADIN ) 5 MG tablet Take 1 tablet (5 mg total) by mouth daily. 04/28/24  Yes Milford, Harlene HERO, FNP  Sotagliflozin  (INPEFA ) 200 MG TABS Take 1 tablet by mouth daily. Patient not taking: No sig reported 03/31/24   Rolan Ezra RAMAN, MD    Past Medical History: Past Medical History:  Diagnosis Date   Acute decompensated heart failure (HCC) 01/03/2013   Allergy    CHF (congestive heart failure) (HCC)    systolic   Chronic kidney disease    CKD (chronic kidney disease) stage 2, GFR 60-89 ml/min  01/04/2013   DM (diabetes mellitus) (HCC) 01/06/2013   Hypertension    hx htn emergency   Obesity    Other secondary pulmonary hypertension (HCC) 12/19/2021   Stroke [facility])     Past Surgical History: Past Surgical History:  Procedure Laterality Date   BUBBLE STUDY  05/22/2019   Procedure: BUBBLE STUDY;  Surgeon: Raford Riggs, MD;  Location: [facility] ENDOSCOPY;  Service: Cardiovascular;;   CARDIOVERSION N/A 11/13/2021   Procedure: CARDIOVERSION;  Surgeon: Raford Riggs, MD;  Location: [facility] ENDOSCOPY;  Service: Cardiovascular;  Laterality: N/A;   CARDIOVERSION N/A 11/30/2021   Procedure: CARDIOVERSION;  Surgeon: Delford Maude BROCKS, MD;  Location: [facility] ENDOSCOPY;  Service: Cardiovascular;  Laterality: N/A;   LEFT AND RIGHT HEART CATHETERIZATION WITH CORONARY ANGIOGRAM N/A 01/07/2013   Procedure: LEFT AND RIGHT HEART CATHETERIZATION WITH CORONARY ANGIOGRAM;  Surgeon: Salena RAMAN Negri, MD;  Location: [facility] CATH LAB;  Service: Cardiovascular;  Laterality: N/A;   RIGHT HEART CATH N/A 03/11/2023   Procedure: RIGHT HEART CATH;  Surgeon: Rolan Ezra RAMAN, MD;  Location: [facility] INVASIVE CV LAB;  Service: Cardiovascular;  Laterality: N/A;   TEE WITHOUT CARDIOVERSION N/A 05/22/2019   Procedure: TRANSESOPHAGEAL ECHOCARDIOGRAM (TEE);  Surgeon: Raford Riggs, MD;  Location: [facility] ENDOSCOPY;  Service: Cardiovascular;  Laterality: N/A;   TEE WITHOUT CARDIOVERSION N/A 11/13/2021   Procedure: TRANSESOPHAGEAL ECHOCARDIOGRAM (TEE);  Surgeon: Raford Riggs, MD;  Location:  [facility] ENDOSCOPY;  Service: Cardiovascular;  Laterality: N/A;    Family History:  Family History  Problem Relation Age of Onset   Heart disease Father 59       MI   Hypertension Father    Hyperlipidemia Father    Diabetes Mother    Hypertension Mother     Social History: Social History   Socioeconomic History   Marital status: Single    Spouse name: Not on file   Number of children: 0   Years of education: Not on file   Highest education level:  Bachelor's degree (e.g., BA, AB, BS)  Occupational History   Occupation: English As A Second Language Teacher  Tobacco Use   Smoking status: Never   Smokeless tobacco: Never  Vaping Use   Vaping status: Never Used  Substance and Sexual Activity   Alcohol use: Not Currently   Drug use: No   Sexual activity: Not on file  Other Topics Concern   Not on file  Social History Narrative   Work or School: technical sales engineer, investment banker, corporate      Home Situation: lives alone      Spiritual Beliefs: Christian      Lifestyle: 15 minutes dialy walking - working up; working on diet            Social Drivers of Health   Tobacco Use: Low Risk (04/20/2024)   Patient History    Smoking Tobacco Use: Never    Smokeless Tobacco Use: Never    Passive Exposure: Not on file  Financial Resource Strain: Low Risk (11/13/2021)   Overall Financial Resource Strain (CARDIA)    Difficulty of Paying Living Expenses: Not very hard  Food Insecurity: No Food Insecurity (03/04/2023)   Hunger Vital Sign    Worried About Running Out of Food in the Last Year: Never true    Ran Out of Food in the Last Year: Never true  Transportation Needs: No Transportation Needs (03/04/2023)   PRAPARE - Administrator, Civil Service (Medical): No    Lack of Transportation (Non-Medical): No  Physical Activity: Sufficiently Active (11/24/2021)   Exercise Vital Sign    Days of Exercise per Week: 7 days    Minutes of Exercise per Session: 30 min  Stress: No Stress Concern Present (11/24/2021)   [employer] of Occupational Health - Occupational Stress Questionnaire    Feeling of Stress : Only a little  Social Connections: Moderately Integrated (11/24/2021)   Social Connection and Isolation Panel    Frequency of Communication with Friends and Family: More than three times a week    Frequency of Social Gatherings with Friends and Family: Three times a week    Attends Religious Services: More than 4 times per year     Active Member of Clubs or Organizations: Yes    Attends Banker Meetings: More than 4 times per year    Marital Status: Never married  Depression (PHQ2-9): Low Risk (11/24/2021)   Depression (PHQ2-9)    PHQ-2 Score: 0  Alcohol Screen: Low Risk (11/13/2021)   Alcohol Screen    Last Alcohol Screening Score (AUDIT): 0  Housing: Medium Risk (03/04/2023)   Housing    Last Housing Risk Score: 1  Utilities: Not At Risk (03/04/2023)   AHC Utilities    Threatened with loss of utilities: No  Health Literacy: Not on file    Allergies:  Allergies[1]  Objective:    Vital Signs:   Temp:  [98.2 F (  36.8 C)] 98.2 F (36.8 C) (12/30 0647) Pulse Rate:  [90] 90 (12/30 0647) Resp:  [32] 32 (12/30 0647) BP: (159)/(111) 159/111 (12/30 0647) SpO2:  [96 %] 96 % (12/30 0647) Weight:  [135.6 kg] 135.6 kg (12/30 0700)   Filed Weights   06/02/24 0700  Weight: 135.6 kg    Physical Exam    General: NAD Neck: No JVD, no thyromegaly or thyroid  nodule.  Lungs: Clear to auscultation bilaterally with normal respiratory effort. CV: Nondisplaced PMI.  Heart irregular S1/S2, no S3/S4, no murmur.  No peripheral edema.   Abdomen: Soft, nontender, no hepatosplenomegaly, no distention.  Skin: Intact without lesions or rashes.  Neurologic: Alert and oriented x 3.  Psych: Normal affect. Extremities: No clubbing or cyanosis.  HEENT: Normal.   Telemetry   Atrial fibrillation 80s (personally reviewed)   Labs  Basic Metabolic Panel: Recent Labs  Lab 06/02/24 0655  NA 139  K 3.8  CL 99  GLUCOSE 292*  BUN 31*  CREATININE 2.50*    Liver Function Tests: No results for input(s): AST, ALT, ALKPHOS, BILITOT, PROT, ALBUMIN in the last 168 hours. No results for input(s): LIPASE, AMYLASE in the last 168 hours. No results for input(s): AMMONIA in the last 168 hours.  CBC: Recent Labs  Lab 06/02/24 0655  HGB 14.6  HCT 43.0    Cardiac Enzymes: No results for  input(s): CKTOTAL, CKMB, CKMBINDEX, TROPONINI in the last 168 hours.  BNP: BNP (last 3 results) Recent Labs    03/30/24 1441 04/20/24 1535  BNP 353.7* 161.2*    ProBNP (last 3 results) No results for input(s): PROBNP in the last 8760 hours.   CBG: No results for input(s): GLUCAP in the last 168 hours.  Coagulation Studies: Recent Labs    06/02/24 0658  LABPROT 27.9*  INR 2.5*    Imaging: No results found.   Assessment/Plan   Persistent AF with cardiomyopathy.  Subtherapeutic INR 2 wks ago.  He is on warfarin.  Plan TEE-guided DCCV today. Discussed risks/benefits and patient agrees to procedure.   Ezra Shuck, MD 06/02/2024, 7:36 AM  Advanced Heart Failure Team Pager [phone] (M-F; 7a - 5p)   Please visit [email or web address]: For overnight coverage please call cardiology fellow first. If fellow not available call Shock/ECMO MD on call.  For ECMO / Mechanical Support (Impella, IABP, LVAD) issues call Shock / ECMO MD on call.       [1] No Known Allergies  "

## 2024-06-02 NOTE — CV Procedure (Signed)
 Procedure:  TEE  Indication: Atrial fibrillation   Sedation: Per anesthesiology  Findings: Please see echo section for full report.  There is a small to moderate pericardial effusion with no tamponade. Normal RV size with moderate RV dysfunction.  Mildly dilated LV with moderate concentric LV hypertrophy, EF 25-30% with diffuse hypokinesis.  Mild left atrial enlargement, no LAA thrombus.  Mild right atrial enlargement with Chiari network.  No PFO/ASD by color doppler.  Mild MR.  Mild TR.  Trileaflet aortic valve with no stenosis or regurgitation.  Normal caliber thoracic aorta with minimal plaque.   Proceed to DCCV.   Ezra Shuck 06/02/2024 8:06 AM

## 2024-06-11 ENCOUNTER — Telehealth: Payer: Self-pay | Admitting: Licensed Clinical Social Worker

## 2024-06-11 NOTE — Telephone Encounter (Signed)
 H&V Care Navigation CSW Progress Note  Clinical Social Worker contacted patient by phone to f/u on patient assistance forms, no answer today at [phone], left voicemail. Also provided reminder of missed coumadin  appt and number to get that rescheduled. Will re-attempt again as able.  Patient is participating in a Managed Medicaid Plan:  No, self pay only  SDOH Screenings   Food Insecurity: No Food Insecurity (03/04/2023)  Housing: Medium Risk (03/04/2023)  Transportation Needs: No Transportation Needs (03/04/2023)  Utilities: Not At Risk (03/04/2023)  Alcohol Screen: Low Risk (11/13/2021)  Depression (PHQ2-9): Low Risk (11/24/2021)  Financial Resource Strain: Low Risk (11/13/2021)  Physical Activity: Sufficiently Active (11/24/2021)  Social Connections: Moderately Integrated (11/24/2021)  Stress: No Stress Concern Present (11/24/2021)  Tobacco Use: Low Risk (04/20/2024)    Marit Lark, MSW, LCSW Clinical Social Worker II [facility] Heart/Vascular Care Navigation  [phone]- work cell phone (preferred)

## 2024-06-17 ENCOUNTER — Telehealth (HOSPITAL_COMMUNITY): Payer: Self-pay

## 2024-06-17 NOTE — Telephone Encounter (Signed)
 Called to confirm/remind patient of their appointment at the Advanced [facility] Failure Clinic on 06/18/24.   Appointment:   [x] Confirmed  [] Left mess   [] No answer/No voice mail  [] VM Full/unable to leave message  [] Phone not in service  Patient reminded to bring all medications and/or complete list.  Confirmed patient has transportation. Gave directions, instructed to utilize valet parking.

## 2024-06-18 ENCOUNTER — Ambulatory Visit (HOSPITAL_COMMUNITY)
Admission: RE | Admit: 2024-06-18 | Discharge: 2024-06-18 | Disposition: A | Discharge status: Home / Self Care | Payer: Self-pay | Source: Ambulatory Visit | Attending: Physician Assistant | Admitting: Physician Assistant

## 2024-06-18 ENCOUNTER — Telehealth (HOSPITAL_BASED_OUTPATIENT_CLINIC_OR_DEPARTMENT_OTHER): Payer: Self-pay | Admitting: Licensed Clinical Social Worker

## 2024-06-18 ENCOUNTER — Encounter (HOSPITAL_COMMUNITY): Payer: Self-pay

## 2024-06-18 VITALS — BP 162/106 | HR 97 | Ht 71.0 in | Wt 318.8 lb

## 2024-06-18 DIAGNOSIS — Z79899 Other long term (current) drug therapy: Secondary | ICD-10-CM | POA: Insufficient documentation

## 2024-06-18 DIAGNOSIS — I428 Other cardiomyopathies: Secondary | ICD-10-CM | POA: Insufficient documentation

## 2024-06-18 DIAGNOSIS — Z5971 Insufficient health insurance coverage: Secondary | ICD-10-CM | POA: Insufficient documentation

## 2024-06-18 DIAGNOSIS — Z833 Family history of diabetes mellitus: Secondary | ICD-10-CM | POA: Insufficient documentation

## 2024-06-18 DIAGNOSIS — Z7901 Long term (current) use of anticoagulants: Secondary | ICD-10-CM | POA: Insufficient documentation

## 2024-06-18 DIAGNOSIS — Z634 Disappearance and death of family member: Secondary | ICD-10-CM | POA: Insufficient documentation

## 2024-06-18 DIAGNOSIS — G4733 Obstructive sleep apnea (adult) (pediatric): Secondary | ICD-10-CM | POA: Insufficient documentation

## 2024-06-18 DIAGNOSIS — N184 Chronic kidney disease, stage 4 (severe): Secondary | ICD-10-CM | POA: Insufficient documentation

## 2024-06-18 DIAGNOSIS — E1122 Type 2 diabetes mellitus with diabetic chronic kidney disease: Secondary | ICD-10-CM | POA: Insufficient documentation

## 2024-06-18 DIAGNOSIS — I130 Hypertensive heart and chronic kidney disease with heart failure and stage 1 through stage 4 chronic kidney disease, or unspecified chronic kidney disease: Secondary | ICD-10-CM | POA: Insufficient documentation

## 2024-06-18 DIAGNOSIS — I10 Essential (primary) hypertension: Secondary | ICD-10-CM

## 2024-06-18 DIAGNOSIS — I5022 Chronic systolic (congestive) heart failure: Secondary | ICD-10-CM | POA: Insufficient documentation

## 2024-06-18 DIAGNOSIS — I4819 Other persistent atrial fibrillation: Secondary | ICD-10-CM | POA: Insufficient documentation

## 2024-06-18 DIAGNOSIS — Z8249 Family history of ischemic heart disease and other diseases of the circulatory system: Secondary | ICD-10-CM | POA: Insufficient documentation

## 2024-06-18 DIAGNOSIS — Z8673 Personal history of transient ischemic attack (TIA), and cerebral infarction without residual deficits: Secondary | ICD-10-CM | POA: Insufficient documentation

## 2024-06-18 MED ORDER — SACUBITRIL-VALSARTAN 49-51 MG PO TABS: 1.0000 | ORAL_TABLET | Freq: Two times a day (BID) | ORAL | 3 refills | Status: AC

## 2024-06-18 NOTE — Progress Notes (Signed)
 H&V Care Navigation CSW Progress Note  Clinical Social Worker met with Richard [name] to check in regarding progress with [employer] and [employer] to assist with medication cost and [facility] bills.  Richard [name] has not worked on this yet as he has been dealing with several deaths in the family.  Lost aunt and uncle last year as well as mom at the end of December.  Struggling greatly with the loss of his mother.  CSW offered supportive listening and encouraged patient to establish with grief counselor through [facility].  Patient unsure about this reports strong support through his brother but will consider starting therapy.  Patient on coumadin  but missed recent appt- patient did not realized he had missed and states he understands importance of keeping up with appointments and labs.  Would be agreeable to trying to get Eliquis  as this would require less maintenance- team had attempted this before but he never returned required income verification.  CSW will work with patient to get required documents prior to sending in application or changing medication- patient agreeable to this plan.  SDOH Screenings   Food Insecurity: No Food Insecurity (03/04/2023)  Housing: Medium Risk (03/04/2023)  Transportation Needs: No Transportation Needs (03/04/2023)  Utilities: Not At Risk (03/04/2023)  Alcohol Screen: Low Risk (11/13/2021)  Depression (PHQ2-9): Low Risk (11/24/2021)  Financial Resource Strain: Medium Risk (06/18/2024)  Physical Activity: Sufficiently Active (11/24/2021)  Social Connections: Moderately Integrated (11/24/2021)  Stress: No Stress Concern Present (11/24/2021)  Tobacco Use: Low Risk (06/18/2024)   Will continue to follow and assist as needed  Stamatia Masri H. Sherlonda Flater, LCSW Clinical Social Worker Advanced Heart Failure Clinic Desk#: [phone] Cell#: [phone]

## 2024-06-18 NOTE — Telephone Encounter (Signed)
 H&V Care Navigation CSW Progress Note  Clinical Social Worker contacted patient by phone to f/u again on assistance applications as pt is self pay. No answer at [phone]- note he is at current appt with HF clinic, left vm and made colleague at clinic aware. Pt also needs to reschedule coumadin  appt as no showed last visit on 12/29.  Patient is participating in a Managed Medicaid Plan:  No, self pay only  SDOH Screenings   Food Insecurity: No Food Insecurity (03/04/2023)  Housing: Medium Risk (03/04/2023)  Transportation Needs: No Transportation Needs (03/04/2023)  Utilities: Not At Risk (03/04/2023)  Alcohol Screen: Low Risk (11/13/2021)  Depression (PHQ2-9): Low Risk (11/24/2021)  Financial Resource Strain: Low Risk (11/13/2021)  Physical Activity: Sufficiently Active (11/24/2021)  Social Connections: Moderately Integrated (11/24/2021)  Stress: No Stress Concern Present (11/24/2021)  Tobacco Use: Low Risk (06/18/2024)    Marit Lark, MSW, LCSW Clinical Social Worker II [facility] Heart/Vascular Care Navigation  [phone]- work cell phone (preferred)

## 2024-06-18 NOTE — Patient Instructions (Signed)
 Medication Changes:  INCREASE Entresto  to 49/51 mg Twice daily   Lab Work:  Your physician recommends that you return for lab work in: 2 weeks  Referrals:  You have been referred to [facility],  please call them at [phone] to schedule an appointment  Special Instructions // Education:  Do the following things EVERYDAY: Weigh yourself in the morning before breakfast. Write it down and keep it in a log. Take your medicines as prescribed Eat low salt foods--Limit salt (sodium) to 2000 mg per day.  Stay as active as you can everyday Limit all fluids for the day to less than 2 liters   Follow-Up in: 1 month   At the Advanced Heart Failure Clinic, you and your health needs are our priority. We have a designated team specialized in the treatment of Heart Failure. This Care Team includes your primary Heart Failure Specialized Cardiologist (physician), Advanced Practice Providers (APPs- Physician Assistants and Nurse Practitioners), and Pharmacist who all work together to provide you with the care you need, when you need it.   You may see any of the following providers on your designated Care Team at your next follow up:  Dr. Toribio Fuel Dr. Ezra Shuck Dr. Odis Brownie Greig Mosses, NP Caffie Shed, [place] [facility] [place], [place] Beckey Coe, NP Jordan Lee, NP Tinnie Redman, PharmD   Please be sure to bring in all your medications bottles to every appointment.   Need to Contact Us :  If you have any questions or concerns before your next appointment please send us  a message through [place] or call our office at [phone].    TO LEAVE A MESSAGE FOR THE NURSE SELECT OPTION 2, PLEASE LEAVE A MESSAGE INCLUDING: YOUR NAME DATE OF BIRTH CALL BACK NUMBER REASON FOR CALL**this is important as we prioritize the call backs  YOU WILL RECEIVE A CALL BACK THE SAME DAY AS LONG AS YOU CALL BEFORE 4:00 PM

## 2024-06-18 NOTE — Progress Notes (Addendum)
 "  ADVANCED HF CLINIC NOTE  Primary Care: Pcp, No EP: Dr. Inocencio HF Cardiologist: Dr. Rolan  HPI: Richard [name] is a 54 y.o.. male with type 2 diabetes, chronic combined systolic and diastolic heart failure, hypertension, chronic kidney disease stage IIla, H/o CVA, obesity and h/o poor compliance.   Echo 09/2015 EF 20-25%, RV mild dilated with moderately reduced systolic function, severe LAE.  Nonischemic cardiomyopathy, likely 2/2 poorly controlled HTN.  HIV negative, SPEP negative, no M spike. UPEP negative.  Had LHC in 2014, negative for CAD.  EF was 25-30% at that time. Improved on f/u echo in 2017. EF normalized 50-55%, GIIDD. He was being followed in the [facility] (Dr. Rolan) but lost to f/u.   In 2020, he was admitted w/ Cryptogenic stroke. 2D echo w/ normal EF, 50-55%, normal RV.  However huge discrepancy in EF by TEE. EF felt to be 20-25%, RV normal. No LV thrombus. EP was consulted for Implantable loop recorder for afib surveillance, however pt declined.    He established care w/ Dr. Santo 6/22. Seen given complaints of LEE. BP was elevated. GDMT adjusted. Losartan  switched to Entresto , spironolactone  added. He was ordered to get repeat echo w/ plans to start SLGTi at subsequent visit, but he never followed up.   Admitted 6/23 w/ a/c CHF in the setting of new atrial fibrillation w/ RVR. Echo EF back down to 25-30%, RV normal. HS trop 466>>349. LHC not perused given renal function, SCr 2.5 on admit. He was diuresed w/ IV Lasix . Placed on Eliquis  for Afib. TEE LVEF 15-20%, Aortic valve did not open with each ventricular contraction, RV systolic function severely reduced, Moderate tricuspid regurgitation, Trivial mitral regurgitation, No LA/LAA thrombus or masses. Underwent DCCV x 1 back to NSR.    Unfortunately he had ERAF. EP consulted and recommended loading w/ IV amio and reattempt DCCV after load. However pt refused to stay and opted on repeat cardioversion as an outpatient. He was  transitioned to PO amiodarone  400 mg bid x 1 week then 200 mg daily thereafter. Eliquis  continued. Transitioned to PO lasix  + GDMT (losartan , spiro, Farxiga  and coreg ). SCr 2.1 day of d/c. Repeat DCCV scheduled 11/30/21. D/c wt 314 lb.    Had outpatient f/u w/ EP on 11/21/21. Was still in Afib, 110 bpm. Amiodarone  reduced to 200 mg daily w/ plans to c/w outpatient DCCV. Labs showed SCr up to 2.78. BUN 24. K 4.5. CO2 21. He was instructed to hold Lasix  and spiro x 1 day and increase PO intake.    Seen in [facility] 6/23, NYHA II symptoms but volume up and SCr up to 2.8. Lasix  stopped, torsemide  40 mg daily started. Spiro stopped with worsening renal function and low-dose BiDil  added for afterload reduction. He was referred back to the [facility].   S/p DCCV 11/30/21 to NSR.  Admitted 9/24 w/ a/c CHF in the setting of med noncompliance and dietary indiscretion w/ sodium. He was markedly volume overloaded and diuresed w/ IV Lasix .  Echo showed EF 25%, RV mildly reduced. PICC placed and found to have low output HF by co-ox, started on milrinone  and later titrated off.  RHC 10/7 showed mildly elevated PCWP at 16 with moderate mixed PAH/PVH, stable CI off milrinone . Cardiac MRI showed LV EF 30%, RV EF 27%, LGE in a pattern that could be consistent with prior myocarditis versus possible cardiac sarcoidosis. ECV percentage was also elevated at 37%, suggesting increased myocardial fibrotic content. ACE level normal. Outpatient cardiac PET was recommended for  f/u. He was transitioned to PO torsemide  and GDMT titrated. Discharge wt 304 lbs.   Lost to follow up 04/2023.  Follow up 10/25, had not been seen in a year. Found to be in AF with RVR, not on AC. Coumadin  started and referred to Coumadin  Clinic. Amiodarone  increased to 200 bid. Later underwent unsuccessful TEE/DCCV on Jun 05, 2024. TEE w/ EF 25-30%, moderate LVH, RV moderately reduced, mild MR/TR  He is here today for CHF follow-up. Notes his throat was sore after TEE and  he's had a little more shortness of breath with exertion. No orthopnea, PND or lower extremity edema. Home weight stable around 315 lb. His spiro was stopped and torsemide  decreased after last visit d/t worsening renal function on labs. Taking all other medicines as prescribed but coumadin  clinic has been unable to reach him. His mother passed away on 2025/06/05. He had 2 other relatives pass away in October. Currently off work for bereavement. No active insurance.   Labs (6/23): K 3.7, creatinine 2.69, hgb 14.5 Labs (10/23): K 3,7, creatinine 2.13, hgb 11.1 Labs (10/24): K 3.5, creatinine 2.3 Labs (11/24): K 4.1, creatinine 2.39 Labs (10/25): K 3.9, creatinine 2.31, normal LFTs, normal TSH Labs (11/25): K 4.0, Scr 3.17 Labs (12/25): K 3.8, creatinine 2.5   Cardiac Testing  - Echo (6/23): EF 25-30%, severe LV dysfunction with global HK, normal RV  - Echo (10/24): EF 25%, RV mildly reduced   - RHC (10/24) : RA 8, PA 64/31 (43), PCWP 16, CO/CI (Fick) 7.28/2.85, PVR 3.7 WU, PAPi 4.1  - CMRI 10/24: LVEF 30%, RVEF 27%, Non-coronary pattern mid-wall LGE in the basal to mid septum, basal inferior wall, and basal inferior RV wall. Consider infiltrative disease such as cardiac sarcoidosis or prior myocarditis.  - TEE 12/25: EF 25-30%, moderate LVH, RV moderately reduced, mild MR/TR  ROS: All systems reviewed and negative except as per HPI.   Past Medical History:  Diagnosis Date   Acute decompensated heart failure (HCC) 01/03/2013   Allergy    CHF (congestive heart failure) (HCC)    systolic   Chronic kidney disease    CKD (chronic kidney disease) stage 2, GFR 60-89 ml/min 01/04/2013   DM (diabetes mellitus) (HCC) 01/06/2013   Hypertension    hx htn emergency   Obesity    Other secondary pulmonary hypertension (HCC) 12/19/2021   Stroke [facility])    Current Outpatient Medications  Medication Sig Dispense Refill   amiodarone  (PACERONE ) 200 MG tablet Take 1 tablet (200 mg total) by mouth daily.  60 tablet 6   atorvastatin  (LIPITOR ) 40 MG tablet Take 1 tablet (40 mg total) by mouth daily. 30 tablet 1   carvedilol  (COREG ) 12.5 MG tablet Take 1 tablet (12.5 mg total) by mouth 2 (two) times daily with a meal. 60 tablet 1   hydrALAZINE  (APRESOLINE ) 100 MG tablet Take 1 tablet (100 mg total) by mouth 3 (three) times daily. 90 tablet 0   isosorbide  mononitrate (IMDUR ) 60 MG 24 hr tablet Take 1 tablet (60 mg total) by mouth daily. 90 tablet 3   sacubitril -valsartan  (ENTRESTO ) 24-26 MG Take 1 tablet by mouth 2 (two) times daily. 60 tablet 11   torsemide  (DEMADEX ) 20 MG tablet Take 2 tablets (40 mg total) by mouth daily. 60 tablet 6   warfarin (COUMADIN ) 5 MG tablet Take 1 tablet (5 mg total) by mouth daily. 30 tablet 1   Sotagliflozin  (INPEFA ) 200 MG TABS Take 1 tablet by mouth daily. (Patient not taking: Reported on 06/18/2024)  30 tablet 11   No current facility-administered medications for this encounter.   No Known Allergies  Social History   Socioeconomic History   Marital status: Single    Spouse name: Not on file   Number of children: 0   Years of education: Not on file   Highest education level: Bachelor's degree (e.g., BA, AB, BS)  Occupational History   Occupation: English As A Second Language Teacher  Tobacco Use   Smoking status: Never   Smokeless tobacco: Never  Vaping Use   Vaping status: Never Used  Substance and Sexual Activity   Alcohol use: Not Currently   Drug use: No   Sexual activity: Not on file  Other Topics Concern   Not on file  Social History Narrative   Work or School: technical sales engineer, investment banker, corporate      Home Situation: lives alone      Spiritual Beliefs: Christian      Lifestyle: 15 minutes dialy walking - working up; working on diet            Social Drivers of Health   Tobacco Use: Low Risk (06/18/2024)   Patient History    Smoking Tobacco Use: Never    Smokeless Tobacco Use: Never    Passive Exposure: Not on file  Financial  Resource Strain: Low Risk (11/13/2021)   Overall Financial Resource Strain (CARDIA)    Difficulty of Paying Living Expenses: Not very hard  Food Insecurity: No Food Insecurity (03/04/2023)   Hunger Vital Sign    Worried About Running Out of Food in the Last Year: Never true    Ran Out of Food in the Last Year: Never true  Transportation Needs: No Transportation Needs (03/04/2023)   PRAPARE - Administrator, Civil Service (Medical): No    Lack of Transportation (Non-Medical): No  Physical Activity: Sufficiently Active (11/24/2021)   Exercise Vital Sign    Days of Exercise per Week: 7 days    Minutes of Exercise per Session: 30 min  Stress: No Stress Concern Present (11/24/2021)   [employer] of Occupational Health - Occupational Stress Questionnaire    Feeling of Stress : Only a little  Social Connections: Moderately Integrated (11/24/2021)   Social Connection and Isolation Panel    Frequency of Communication with Friends and Family: More than three times a week    Frequency of Social Gatherings with Friends and Family: Three times a week    Attends Religious Services: More than 4 times per year    Active Member of Clubs or Organizations: Yes    Attends Banker Meetings: More than 4 times per year    Marital Status: Never married  Intimate Partner Violence: Not At Risk (03/04/2023)   Humiliation, Afraid, Rape, and Kick questionnaire    Fear of Current or Ex-Partner: No    Emotionally Abused: No    Physically Abused: No    Sexually Abused: No  Depression (PHQ2-9): Low Risk (11/24/2021)   Depression (PHQ2-9)    PHQ-2 Score: 0  Alcohol Screen: Low Risk (11/13/2021)   Alcohol Screen    Last Alcohol Screening Score (AUDIT): 0  Housing: Medium Risk (03/04/2023)   Housing    Last Housing Risk Score: 1  Utilities: Not At Risk (03/04/2023)   AHC Utilities    Threatened with loss of utilities: No  Health Literacy: Not on file   Family History  Problem Relation  Age of Onset   Heart disease Father 40  MI   Hypertension Father    Hyperlipidemia Father    Diabetes Mother    Hypertension Mother    BP (!) 162/106   Pulse 97   Ht 5' 11 (1.803 m)   Wt (!) 144.6 kg (318 lb 12.8 oz)   SpO2 94%   BMI 44.46 kg/m   Wt Readings from Last 3 Encounters:  06/18/24 (!) 144.6 kg (318 lb 12.8 oz)  06/02/24 135.6 kg (299 lb)  04/20/24 (!) 144.4 kg (318 lb 6.4 oz)   PHYSICAL EXAM: General:  Ambulated into clinic. No distress. Neck: JVD difficult to assess, thick neck Cor: Irregularly irregular rhythm. No murmurs.  Lungs: clear Abdomen: obese, soft, nontender, nondistended. Extremities: no edema Neuro: alert & orientedx3. Affect pleasant   ASSESSMENT & PLAN: 1. Chronic Systolic Heart Failure: NICM, likely 2/2 poorly controlled HTN + tachymediated from rapid Afib. Echo (2014): EF 25-30%, LHC no CAD.  Echo (4/17): EF 20-25%, RV mild dilated with moderately reduced systolic function, severe LAE. SPEP negative, no M spike. UPEP negative in 2017. Echo (10/17): EF normalized, 50-55%. Echo (12/20): EF 50-55%. Echo (6/23): EF 25-30%, global HK, RV normal. In setting of new Afib w/ RVR, uncontrolled hypertension and poor compliance w/ meds. Echo (10/24): EF 25%, RV mildly reduced. Admitted 10/24 for a/c CHF in setting of dietary indiscretion and poor med compliance. Co-ox c/w low output, required inotropic support w/ milrinone  to help w/ diuresis. RHC 10/24 after diuresis and weaning off of milrinone  showed mildly elevated PCWP at 16 with moderate mixed PAH/PVH, stable CI off milrinone . cMRI 10/24 showed LV EF 30%, RV EF 27%, LGE in a pattern that could be consistent with prior myocarditis versus possible cardiac sarcoidosis. ACE level normal. No h/o lung disease and no FH of sarcoidosis. Needs Cardiac PET for further assessment to r/o sarcoidosis, unfortunately he is uninsured. Not current candidate for LHC given CKD w/ b/l SCr >2. TEE 12/25 w/ EF 25-30%, moderate  LVH, RV moderately reduced, mild MR/TR - NYHA II/early III. Volume okay on exam. Continue Torsemide  40 mg daily.  - Increase Entresto  to 49/51 mg BID, good Rx card - Off spiro d/t CKD - Continue Imdur  60 mg daily + hydralazine  100 mg tid.  - Continue Coreg  12.5 mg bid.  - Try to get assistance app for SGLT2i next visit - No digoxin  w/ CKD. - BMET/proBNP today, repeat BMET 2 weeks 2. Persistent Atrial Fibrillation: Diagnosed 6/23. s/p TEE/DCCV to NSR w/ ERAF. s/p DCCV 11/30/21 to NSR. Had recurrence and underwent unsuccessful attempt at DCCV 12/25. - Continue amiodarone  200 mg daily and coreg  for rate control. Needs annual eye exams. TSH and LFTs okay in 10/25. - He is uninsured. Previously could not get assistance for DOAC as he did not provide paperwork, HF CSW assisting him with this. Continue Coumadin  through the Clinic. INR followed by Coumadin  Clinic, they have been unable to reach him. Need to get back in for monitoring. - Followed in EP Clinic, referral submitted for f/u appointment. Needs to be considered for Afib ablation, although likely not a candidate until he gets insurance. Not ideal Tikosyn candidate with history of AKI and non-compliance. - Needs CPAP  3. HTN: BP above goal - Meds as above - BMET today. - Needs CPAP 4. CKD Stage IIIb - IV: suspect hypertensive/ DM nephropathy.  - Baseline SCr 2.1-2.5 . BMET today. - He has been referred to [facility].  5. Type 2 DM: last A1C 8.4. - Per PCP. 6. H/o CVA:  Cryptogenetic (2020). Refused loop recorder. - ? If cardioembolic, given AF detection. - Unable to get assistance for DOAC. On warfarin as above.  7. OSA: + sleep study 01/2023. - + sleep study 8/24. - Needs CPAP. Referred to Pulmonary but may not get device without insurance. 8. SDOH: He is uninsured. HF CSW assisting with insurance application. - Needs PCP - Meds thru HF fund, difficult situation; unclear if he is taking meds as directed. Would be a good paramedicine candidate  but not [facility]  Follow-up: 4 weeks with APP to assess compliance and titrate medications  Yan Okray N, PA-C 06/18/24 "

## 2024-06-23 ENCOUNTER — Other Ambulatory Visit: Payer: Self-pay | Admitting: Family Medicine

## 2024-06-23 ENCOUNTER — Other Ambulatory Visit (HOSPITAL_COMMUNITY): Payer: Self-pay | Admitting: Cardiology

## 2024-06-23 ENCOUNTER — Other Ambulatory Visit (HOSPITAL_COMMUNITY): Payer: Self-pay

## 2024-06-23 DIAGNOSIS — Z5181 Encounter for therapeutic drug level monitoring: Secondary | ICD-10-CM

## 2024-06-23 DIAGNOSIS — I480 Paroxysmal atrial fibrillation: Secondary | ICD-10-CM

## 2024-06-24 ENCOUNTER — Telehealth (HOSPITAL_COMMUNITY): Payer: Self-pay | Admitting: Licensed Clinical Social Worker

## 2024-06-24 ENCOUNTER — Other Ambulatory Visit (HOSPITAL_COMMUNITY): Payer: Self-pay

## 2024-06-24 ENCOUNTER — Other Ambulatory Visit: Payer: Self-pay

## 2024-06-24 NOTE — Telephone Encounter (Signed)
 H&V Care Navigation CSW Progress Note  Clinical Social Worker called pt to see if he had proof of income to turn in for application for Eliquis  assistance.  Patient provided proof of income in form of last 3 pay stubs.  CSW provided to patient advocate who will turn in [employer] application for assistance for review.  Per provider if approved will need to work with pharmacy team to transition from coumadin  to eliquis .  Will continue to follow in clinic and assist as needed  Chrstopher Malenfant H. Alexzia Kasler, LCSW Clinical Social Worker Advanced Heart Failure Clinic Desk#: [phone] Cell#: [phone]

## 2024-06-25 ENCOUNTER — Other Ambulatory Visit (HOSPITAL_COMMUNITY): Payer: Self-pay

## 2024-06-25 MED ORDER — HYDRALAZINE HCL 100 MG PO TABS
100.0000 mg | ORAL_TABLET | Freq: Three times a day (TID) | ORAL | 0 refills | Status: AC
  Filled 2024-06-25 – 2024-07-09 (×2): qty 90, 30d supply, fill #0

## 2024-06-25 MED ORDER — WARFARIN SODIUM 5 MG PO TABS
5.0000 mg | ORAL_TABLET | Freq: Every day | ORAL | 0 refills | Status: AC
  Filled 2024-06-25 – 2024-07-09 (×2): qty 10, 10d supply, fill #0

## 2024-06-25 NOTE — Telephone Encounter (Signed)
 Prescription refill request received for warfarin Lov: 06/18/2024 Next INR check: 06/01/2024 Warfarin tablet strength: 5mg    Pt is overdue for an office visit. LMOM for pt to call back. Refill for sent in for 10 tabs.

## 2024-06-26 ENCOUNTER — Other Ambulatory Visit (HOSPITAL_COMMUNITY): Payer: Self-pay

## 2024-06-26 ENCOUNTER — Telehealth (HOSPITAL_COMMUNITY): Payer: Self-pay

## 2024-06-26 MED ORDER — ISOSORBIDE MONONITRATE ER 60 MG PO TB24
60.0000 mg | ORAL_TABLET | Freq: Every day | ORAL | 3 refills | Status: AC
  Filled 2024-06-26 – 2024-07-09 (×2): qty 30, 30d supply, fill #0

## 2024-06-26 NOTE — Telephone Encounter (Signed)
 Advanced Heart Failure Patient Advocate Encounter  Application for Eliquis  faxed to [employer] on 06/26/2024. Application form attached to patient chart.  Rachel DEL, CPhT Rx Patient Advocate Phone: [phone]

## 2024-07-01 NOTE — Telephone Encounter (Signed)
 Patient was approved to receive Eliquis  from [employer] Effective 06/29/2024 to 06/28/2025

## 2024-07-02 ENCOUNTER — Ambulatory Visit (HOSPITAL_COMMUNITY): Payer: Self-pay

## 2024-07-06 ENCOUNTER — Ambulatory Visit: Payer: Self-pay

## 2024-07-06 ENCOUNTER — Telehealth: Payer: Self-pay | Admitting: *Deleted

## 2024-07-06 ENCOUNTER — Other Ambulatory Visit (HOSPITAL_COMMUNITY): Payer: Self-pay

## 2024-07-08 ENCOUNTER — Telehealth: Payer: Self-pay | Admitting: *Deleted

## 2024-07-08 ENCOUNTER — Other Ambulatory Visit (HOSPITAL_COMMUNITY): Payer: Self-pay

## 2024-07-08 NOTE — Telephone Encounter (Signed)
 Returned call to the patient after receiving a voicemail that he needs to get rescheduled. There was no answer so left him a message to call back.

## 2024-07-09 ENCOUNTER — Other Ambulatory Visit (HOSPITAL_COMMUNITY): Payer: Self-pay

## 2024-07-09 ENCOUNTER — Other Ambulatory Visit: Payer: Self-pay

## 2024-07-09 ENCOUNTER — Telehealth: Payer: Self-pay

## 2024-07-09 ENCOUNTER — Ambulatory Visit (HOSPITAL_COMMUNITY): Payer: Self-pay

## 2024-07-09 ENCOUNTER — Other Ambulatory Visit (HOSPITAL_COMMUNITY): Payer: Self-pay | Admitting: Cardiology

## 2024-07-09 ENCOUNTER — Telehealth (HOSPITAL_COMMUNITY): Payer: Self-pay

## 2024-07-09 MED ORDER — APIXABAN 5 MG PO TABS: 5.0000 mg | ORAL_TABLET | Freq: Two times a day (BID) | ORAL | 6 refills | Status: AC

## 2024-07-09 NOTE — Telephone Encounter (Signed)
 Advanced Heart Failure Triage Encounter  Patient Name: Richard [name]  Date of Call: 07/09/24  Problem:  Patient reports he was approved for a year supply of Eliquis  but is currently on Warfarin. Patients wants to know when he can start Eliquis .   Plan:  Sent to provider for further review.   Lupita Rosales N Elide Stalzer, CMA

## 2024-07-09 NOTE — Telephone Encounter (Signed)
 Lpmtcb to schedule INR appt prior to starting Eliquis  5 mg bid.

## 2024-07-10 ENCOUNTER — Other Ambulatory Visit: Payer: Self-pay

## 2024-07-14 ENCOUNTER — Ambulatory Visit (HOSPITAL_COMMUNITY): Payer: Self-pay

## 2024-07-20 ENCOUNTER — Ambulatory Visit (HOSPITAL_COMMUNITY): Payer: Self-pay

## 2024-08-03 ENCOUNTER — Ambulatory Visit: Payer: Self-pay | Admitting: Student in an Organized Health Care Education/Training Program
